# Patient Record
Sex: Male | Born: 1945 | Race: White | Hispanic: No | Marital: Single | Smoking: Never smoker
Health system: Southern US, Community
[De-identification: ages and names within clinical notes are randomized; demographics above are authoritative.]

## PROBLEM LIST (undated history)

## (undated) DIAGNOSIS — H269 Unspecified cataract: Secondary | ICD-10-CM

## (undated) DIAGNOSIS — D696 Thrombocytopenia, unspecified: Secondary | ICD-10-CM

## (undated) DIAGNOSIS — I10 Essential (primary) hypertension: Secondary | ICD-10-CM

## (undated) DIAGNOSIS — J449 Chronic obstructive pulmonary disease, unspecified: Secondary | ICD-10-CM

## (undated) DIAGNOSIS — Z972 Presence of dental prosthetic device (complete) (partial): Secondary | ICD-10-CM

## (undated) DIAGNOSIS — I509 Heart failure, unspecified: Secondary | ICD-10-CM

## (undated) DIAGNOSIS — Z95810 Presence of automatic (implantable) cardiac defibrillator: Secondary | ICD-10-CM

## (undated) DIAGNOSIS — E11319 Type 2 diabetes mellitus with unspecified diabetic retinopathy without macular edema: Secondary | ICD-10-CM

## (undated) DIAGNOSIS — J90 Pleural effusion, not elsewhere classified: Secondary | ICD-10-CM

## (undated) DIAGNOSIS — I519 Heart disease, unspecified: Secondary | ICD-10-CM

## (undated) DIAGNOSIS — T8859XA Other complications of anesthesia, initial encounter: Secondary | ICD-10-CM

## (undated) DIAGNOSIS — E119 Type 2 diabetes mellitus without complications: Secondary | ICD-10-CM

## (undated) DIAGNOSIS — M1990 Unspecified osteoarthritis, unspecified site: Secondary | ICD-10-CM

## (undated) DIAGNOSIS — K30 Functional dyspepsia: Secondary | ICD-10-CM

## (undated) DIAGNOSIS — Z950 Presence of cardiac pacemaker: Secondary | ICD-10-CM

## (undated) DIAGNOSIS — N189 Chronic kidney disease, unspecified: Secondary | ICD-10-CM

## (undated) DIAGNOSIS — H35039 Hypertensive retinopathy, unspecified eye: Secondary | ICD-10-CM

## (undated) HISTORY — DX: Hypertensive retinopathy, unspecified eye: H35.039

## (undated) HISTORY — DX: Chronic kidney disease, unspecified: N18.9

## (undated) HISTORY — DX: Functional dyspepsia: K30

## (undated) HISTORY — DX: Thrombocytopenia, unspecified: D69.6

## (undated) HISTORY — DX: Unspecified cataract: H26.9

## (undated) HISTORY — PX: CARDIAC SURGERY: SHX584

## (undated) HISTORY — DX: Heart disease, unspecified: I51.9

## (undated) HISTORY — DX: Type 2 diabetes mellitus with unspecified diabetic retinopathy without macular edema: E11.319

## (undated) HISTORY — PX: THORACENTESIS: SHX235

## (undated) HISTORY — PX: CHOLECYSTECTOMY: SHX55

## (undated) HISTORY — DX: Unspecified osteoarthritis, unspecified site: M19.90

## (undated) HISTORY — DX: Type 2 diabetes mellitus without complications: E11.9

## (undated) HISTORY — PX: APPENDECTOMY: SHX54

## (undated) HISTORY — DX: Heart failure, unspecified: I50.9

## (undated) HISTORY — DX: Chronic obstructive pulmonary disease, unspecified: J44.9

---

## 2009-12-18 ENCOUNTER — Inpatient Hospital Stay: Payer: Self-pay | Admitting: Internal Medicine

## 2010-02-21 ENCOUNTER — Encounter: Payer: Self-pay | Admitting: Cardiology

## 2010-03-18 ENCOUNTER — Encounter: Payer: Self-pay | Admitting: Cardiology

## 2010-04-18 ENCOUNTER — Encounter: Payer: Self-pay | Admitting: Cardiology

## 2010-12-23 ENCOUNTER — Ambulatory Visit: Payer: Self-pay | Admitting: Gastroenterology

## 2010-12-23 HISTORY — PX: COLONOSCOPY: SHX174

## 2010-12-26 LAB — PATHOLOGY REPORT

## 2014-02-22 DIAGNOSIS — I2510 Atherosclerotic heart disease of native coronary artery without angina pectoris: Secondary | ICD-10-CM | POA: Insufficient documentation

## 2014-02-22 DIAGNOSIS — I152 Hypertension secondary to endocrine disorders: Secondary | ICD-10-CM | POA: Insufficient documentation

## 2014-02-22 DIAGNOSIS — E1129 Type 2 diabetes mellitus with other diabetic kidney complication: Secondary | ICD-10-CM | POA: Insufficient documentation

## 2014-02-22 DIAGNOSIS — Z794 Long term (current) use of insulin: Secondary | ICD-10-CM | POA: Insufficient documentation

## 2014-02-22 DIAGNOSIS — E1159 Type 2 diabetes mellitus with other circulatory complications: Secondary | ICD-10-CM | POA: Insufficient documentation

## 2014-10-23 DIAGNOSIS — E785 Hyperlipidemia, unspecified: Secondary | ICD-10-CM | POA: Insufficient documentation

## 2016-02-14 ENCOUNTER — Encounter: Payer: Self-pay | Admitting: Oncology

## 2016-02-14 ENCOUNTER — Inpatient Hospital Stay: Payer: Medicare Other

## 2016-02-14 ENCOUNTER — Inpatient Hospital Stay: Payer: Medicare Other | Attending: Oncology | Admitting: Oncology

## 2016-02-14 VITALS — BP 174/81 | HR 66 | Temp 97.5°F | Resp 18 | Ht 73.0 in | Wt 178.0 lb

## 2016-02-14 DIAGNOSIS — J449 Chronic obstructive pulmonary disease, unspecified: Secondary | ICD-10-CM | POA: Diagnosis not present

## 2016-02-14 DIAGNOSIS — E119 Type 2 diabetes mellitus without complications: Secondary | ICD-10-CM | POA: Diagnosis not present

## 2016-02-14 DIAGNOSIS — Z79899 Other long term (current) drug therapy: Secondary | ICD-10-CM | POA: Insufficient documentation

## 2016-02-14 DIAGNOSIS — Z7982 Long term (current) use of aspirin: Secondary | ICD-10-CM | POA: Insufficient documentation

## 2016-02-14 DIAGNOSIS — D696 Thrombocytopenia, unspecified: Secondary | ICD-10-CM | POA: Diagnosis not present

## 2016-02-14 DIAGNOSIS — M129 Arthropathy, unspecified: Secondary | ICD-10-CM | POA: Insufficient documentation

## 2016-02-14 DIAGNOSIS — I10 Essential (primary) hypertension: Secondary | ICD-10-CM | POA: Diagnosis not present

## 2016-02-14 DIAGNOSIS — D649 Anemia, unspecified: Secondary | ICD-10-CM | POA: Insufficient documentation

## 2016-02-14 DIAGNOSIS — Z794 Long term (current) use of insulin: Secondary | ICD-10-CM | POA: Diagnosis not present

## 2016-02-14 DIAGNOSIS — Z7984 Long term (current) use of oral hypoglycemic drugs: Secondary | ICD-10-CM | POA: Insufficient documentation

## 2016-02-14 DIAGNOSIS — D61818 Other pancytopenia: Secondary | ICD-10-CM

## 2016-02-14 LAB — IRON AND TIBC
IRON: 54 ug/dL (ref 45–182)
SATURATION RATIOS: 20 % (ref 17.9–39.5)
TIBC: 272 ug/dL (ref 250–450)
UIBC: 218 ug/dL

## 2016-02-14 LAB — CBC WITH DIFFERENTIAL/PLATELET
Basophils Absolute: 0 10*3/uL (ref 0–0.1)
Basophils Relative: 1 %
EOS PCT: 1 %
Eosinophils Absolute: 0.1 10*3/uL (ref 0–0.7)
HCT: 35 % — ABNORMAL LOW (ref 40.0–52.0)
HEMOGLOBIN: 12.7 g/dL — AB (ref 13.0–18.0)
LYMPHS PCT: 30 %
Lymphs Abs: 1.4 10*3/uL (ref 1.0–3.6)
MCH: 32.9 pg (ref 26.0–34.0)
MCHC: 36.1 g/dL — AB (ref 32.0–36.0)
MCV: 91.1 fL (ref 80.0–100.0)
MONOS PCT: 8 %
Monocytes Absolute: 0.4 10*3/uL (ref 0.2–1.0)
NEUTROS PCT: 60 %
Neutro Abs: 2.8 10*3/uL (ref 1.4–6.5)
Platelets: 116 10*3/uL — ABNORMAL LOW (ref 150–440)
RBC: 3.85 MIL/uL — AB (ref 4.40–5.90)
RDW: 13.9 % (ref 11.5–14.5)
WBC: 4.8 10*3/uL (ref 3.8–10.6)

## 2016-02-14 LAB — FOLATE: Folate: 23 ng/mL (ref >5.9)

## 2016-02-14 LAB — LACTATE DEHYDROGENASE: LDH: 180 U/L (ref 98–192)

## 2016-02-14 LAB — RETICULOCYTES
RBC.: 3.85 MIL/uL — ABNORMAL LOW (ref 4.40–5.90)
RETIC COUNT ABSOLUTE: 65.5 10*3/uL (ref 19.0–183.0)
Retic Ct Pct: 1.7 % (ref 0.4–3.1)

## 2016-02-14 LAB — VITAMIN B12: VITAMIN B 12: 349 pg/mL (ref 180–914)

## 2016-02-14 LAB — FERRITIN: Ferritin: 130 ng/mL (ref 24–336)

## 2016-02-14 LAB — DAT, POLYSPECIFIC AHG (ARMC ONLY): Polyspecific AHG test: NEGATIVE

## 2016-02-14 NOTE — Progress Notes (Signed)
Offers no complaints. Here for evaluation regarding thrombocytopenia, referred by PCP Dr. Lisette Grinder.

## 2016-02-15 LAB — PROTEIN ELECTROPHORESIS, SERUM
A/G RATIO SPE: 1.5 (ref 0.7–1.7)
Albumin ELP: 4.1 g/dL (ref 2.9–4.4)
Alpha-1-Globulin: 0.2 g/dL (ref 0.0–0.4)
Alpha-2-Globulin: 0.7 g/dL (ref 0.4–1.0)
Beta Globulin: 1 g/dL (ref 0.7–1.3)
GLOBULIN, TOTAL: 2.8 g/dL (ref 2.2–3.9)
Gamma Globulin: 0.8 g/dL (ref 0.4–1.8)
Total Protein ELP: 6.9 g/dL (ref 6.0–8.5)

## 2016-02-15 LAB — PLATELET ANTIBODY PROFILE, SERUM
HLA Ab Ser Ql EIA: NEGATIVE
IA/IIA ANTIBODY: NEGATIVE
IB/IX ANTIBODY: NEGATIVE
IIB/IIIA Antibody: NEGATIVE

## 2016-02-15 LAB — HAPTOGLOBIN: Haptoglobin: 98 mg/dL (ref 34–200)

## 2016-02-19 NOTE — Progress Notes (Addendum)
[facility]  Telephone:(336) [phone] Fax:(336) [phone]  ID: Rick Zuniga OB: Nov 20, 1945  MR#: [id number]  ELT#:[id number]  Patient Care Team: Madelyn Brunner, MD as PCP - General (Internal Medicine)  CHIEF COMPLAINT: Thrombocytopenia, anemia.  INTERVAL HISTORY: Patient is a 70 year old male who was noted to have a declining platelet count on routine blood work. He is also found to have a mild anemia. Currently, he feels well and is asymptomatic. He denies any weakness or fatigue. He denies any easy bleeding or bruising. He has a good appetite and denies weight loss. He has no neurologic complaints. He denies any recent fevers or illnesses. He has no chest pain or shortness of breath. He denies any nausea, vomiting, constipation, or diarrhea. He has no urinary complaints. Patient feels at his baseline and offers no specific complaints today.  REVIEW OF SYSTEMS:   Review of Systems  Constitutional: Negative.  Negative for fever, weight loss and malaise/fatigue.  Respiratory: Negative.  Negative for cough and shortness of breath.   Cardiovascular: Negative.  Negative for chest pain.  Gastrointestinal: Negative.  Negative for abdominal pain.  Genitourinary: Negative.   Musculoskeletal: Negative.   Neurological: Negative.  Negative for weakness.  Endo/Heme/Allergies: Does not bruise/bleed easily.  Psychiatric/Behavioral: Negative.     As per HPI. Otherwise, a complete review of systems is negatve.  PAST MEDICAL HISTORY: Past Medical History  Diagnosis Date  . Diabetes ([place])   . Heart disease   . COPD (chronic obstructive pulmonary disease) ([place])   . Indigestion   . Arthritis   . Thrombocytopenia ([place])     PAST SURGICAL HISTORY: Past Surgical History  Procedure Laterality Date  . Colonoscopy  12/23/2010  . Cholecystectomy    . Cardiac surgery      bypass    FAMILY HISTORY No family history on file.     ADVANCED DIRECTIVES:    HEALTH  MAINTENANCE: Social History  Substance Use Topics  . Smoking status: Not on file  . Smokeless tobacco: Not on file  . Alcohol Use: Not on file     Colonoscopy:  PAP:  Bone density:  Lipid panel:  Allergies  Allergen Reactions  . Pioglitazone Swelling  . Atorvastatin Rash  . Benazepril Rash  . Latex Rash  . Tape Rash    Current Outpatient Prescriptions  Medication Sig Dispense Refill  . aspirin EC 81 MG tablet Take 1 tablet by mouth daily.    Marland Kitchen glipiZIDE (GLUCOTROL) 10 MG tablet Take 1 tablet by mouth 2 (two) times daily.    . insulin NPH-regular Human (NOVOLIN 70/30) (70-30) 100 UNIT/ML injection Inject 20 Units into the skin 2 (two) times daily.    . metFORMIN (GLUCOPHAGE) 500 MG tablet Take 1 tablet by mouth 2 (two) times daily.    Marland Kitchen nystatin cream (MYCOSTATIN) Apply 1 application topically 2 (two) times daily.    . Probiotic Product (ALIGN) 4 MG CAPS Take 1 capsule by mouth daily as needed (for diarrhea).    . vitamin C (ASCORBIC ACID) 500 MG tablet Take 1 tablet by mouth daily.     No current facility-administered medications for this visit.    OBJECTIVE: Filed Vitals:   02/14/16 1012  BP: 174/81  Pulse: 66  Temp: 97.5 F (36.4 C)  Resp: 18     Body mass index is 23.49 kg/(m^2).    ECOG FS:0 - Asymptomatic  General: Well-developed, well-nourished, no acute distress. Eyes: Pink conjunctiva, anicteric sclera. HEENT: Normocephalic, moist mucous membranes, clear  oropharnyx. Lungs: Clear to auscultation bilaterally. Heart: Regular rate and rhythm. No rubs, murmurs, or gallops. Abdomen: Soft, nontender, nondistended. No organomegaly noted, normoactive bowel sounds. Musculoskeletal: No edema, cyanosis, or clubbing. Neuro: Alert, answering all questions appropriately. Cranial nerves grossly intact. Skin: No rashes or petechiae noted. Psych: Normal affect. Lymphatics: No cervical, calvicular, axillary or inguinal LAD.   LAB RESULTS:  No results found for: NA, K,  CL, CO2, GLUCOSE, BUN, CREATININE, CALCIUM, PROT, ALBUMIN, AST, ALT, ALKPHOS, BILITOT, GFRNONAA, GFRAA  Lab Results  Component Value Date   WBC 4.8 02/14/2016   NEUTROABS 2.8 02/14/2016   HGB 12.7* 02/14/2016   HCT 35.0* 02/14/2016   MCV 91.1 02/14/2016   PLT 116* 02/14/2016   Lab Results  Component Value Date   IRON 54 02/14/2016   TIBC 272 02/14/2016   IRONPCTSAT 20 02/14/2016    Lab Results  Component Value Date   FERRITIN 130 02/14/2016     STUDIES: No results found.  ASSESSMENT: Thrombocytopenia, anemia.  PLAN:    1. Thrombocytopenia: Patient's pelvic count is decreased, but relatively unchanged for approximately 6 months. The remainder of his laboratory work is either negative or within normal limits. No intervention is needed at this time. Patient has been instructed it is safe to take his daily aspirin as prescribed. Return to clinic in 1 month for further evaluation and discussion of his laboratory work. 2. Anemia: Mild. Iron stores, B 12, and folate are all within normal limits. Patient has no evidence of hemolysis. No intervention is needed. Patient does not require bone marrow biopsy. Follow-up as above. 3. Hypertension: Patient does not appear to be on blood pressure medications. I will forward results to patient's primary care physician for further evaluation and treatment if necessary.  Patient expressed understanding and was in agreement with this plan. He also understands that He can call clinic at any time with any questions, concerns, or complaints.    [name], MD   02/19/2016 7:52 AM

## 2016-02-28 DIAGNOSIS — D696 Thrombocytopenia, unspecified: Secondary | ICD-10-CM | POA: Insufficient documentation

## 2016-02-28 DIAGNOSIS — D649 Anemia, unspecified: Secondary | ICD-10-CM | POA: Insufficient documentation

## 2016-03-13 ENCOUNTER — Inpatient Hospital Stay (HOSPITAL_BASED_OUTPATIENT_CLINIC_OR_DEPARTMENT_OTHER): Payer: Medicare Other | Admitting: Oncology

## 2016-03-13 ENCOUNTER — Inpatient Hospital Stay: Payer: Medicare Other | Attending: Oncology

## 2016-03-13 ENCOUNTER — Encounter (INDEPENDENT_AMBULATORY_CARE_PROVIDER_SITE_OTHER): Payer: Self-pay

## 2016-03-13 VITALS — BP 153/74 | HR 68 | Temp 95.3°F | Resp 18 | Wt 180.0 lb

## 2016-03-13 DIAGNOSIS — D696 Thrombocytopenia, unspecified: Secondary | ICD-10-CM | POA: Diagnosis not present

## 2016-03-13 DIAGNOSIS — I509 Heart failure, unspecified: Secondary | ICD-10-CM

## 2016-03-13 DIAGNOSIS — M129 Arthropathy, unspecified: Secondary | ICD-10-CM

## 2016-03-13 DIAGNOSIS — Z7982 Long term (current) use of aspirin: Secondary | ICD-10-CM | POA: Insufficient documentation

## 2016-03-13 DIAGNOSIS — E119 Type 2 diabetes mellitus without complications: Secondary | ICD-10-CM | POA: Diagnosis not present

## 2016-03-13 DIAGNOSIS — D649 Anemia, unspecified: Secondary | ICD-10-CM | POA: Diagnosis not present

## 2016-03-13 DIAGNOSIS — K30 Functional dyspepsia: Secondary | ICD-10-CM

## 2016-03-13 DIAGNOSIS — Z794 Long term (current) use of insulin: Secondary | ICD-10-CM | POA: Diagnosis not present

## 2016-03-13 DIAGNOSIS — Z79899 Other long term (current) drug therapy: Secondary | ICD-10-CM

## 2016-03-13 DIAGNOSIS — J449 Chronic obstructive pulmonary disease, unspecified: Secondary | ICD-10-CM | POA: Insufficient documentation

## 2016-03-13 DIAGNOSIS — Z7984 Long term (current) use of oral hypoglycemic drugs: Secondary | ICD-10-CM | POA: Insufficient documentation

## 2016-03-13 DIAGNOSIS — I10 Essential (primary) hypertension: Secondary | ICD-10-CM | POA: Diagnosis not present

## 2016-03-13 DIAGNOSIS — D61818 Other pancytopenia: Secondary | ICD-10-CM

## 2016-03-13 LAB — CBC
HEMATOCRIT: 32.9 % — AB (ref 40.0–52.0)
HEMOGLOBIN: 11.8 g/dL — AB (ref 13.0–18.0)
MCH: 33.1 pg (ref 26.0–34.0)
MCHC: 36 g/dL (ref 32.0–36.0)
MCV: 92.1 fL (ref 80.0–100.0)
Platelets: 100 10*3/uL — ABNORMAL LOW (ref 150–440)
RBC: 3.57 MIL/uL — ABNORMAL LOW (ref 4.40–5.90)
RDW: 13.8 % (ref 11.5–14.5)
WBC: 4.2 10*3/uL (ref 3.8–10.6)

## 2016-03-13 NOTE — Progress Notes (Signed)
States is feeling well. Offers no complaints. 

## 2016-03-16 NOTE — Progress Notes (Signed)
[facility]  Telephone:([phone] [phone]  ID: Rick Zuniga OB: 1946-01-31  MR#: [id number]  JJH#:[id number]  Patient Care Team: Madelyn Brunner, MD as PCP - General (Internal Medicine)  CHIEF COMPLAINT: Thrombocytopenia, anemia.  INTERVAL HISTORY: Patient returns to clinic today for further evaluation and repeat laboratory work. He continues to feel well and is asymptomatic. He denies any weakness or fatigue. He denies any easy bleeding or bruising. He has a good appetite and denies weight loss. He has no neurologic complaints. He denies any recent fevers or illnesses. He has no chest pain or shortness of breath. He denies any nausea, vomiting, constipation, or diarrhea. He has no urinary complaints. Patient offers no specific complaints today.  REVIEW OF SYSTEMS:   Review of Systems  Constitutional: Negative.  Negative for fever, malaise/fatigue and weight loss.  Respiratory: Negative.  Negative for cough and shortness of breath.   Cardiovascular: Negative.  Negative for chest pain.  Gastrointestinal: Negative.  Negative for abdominal pain.  Genitourinary: Negative.   Musculoskeletal: Negative.   Neurological: Negative.  Negative for weakness.  Endo/Heme/Allergies: Does not bruise/bleed easily.  Psychiatric/Behavioral: Negative.     As per HPI. Otherwise, a complete review of systems is negatve.  PAST MEDICAL HISTORY: Past Medical History:  Diagnosis Date  . Arthritis   . COPD (chronic obstructive pulmonary disease) ([place])   . Diabetes ([place])   . Heart disease   . Indigestion   . Thrombocytopenia ([place])     PAST SURGICAL HISTORY: Past Surgical History:  Procedure Laterality Date  . CARDIAC SURGERY     bypass  . CHOLECYSTECTOMY    . COLONOSCOPY  12/23/2010    FAMILY HISTORY No family history on file.     ADVANCED DIRECTIVES:    HEALTH MAINTENANCE: Social History  Substance Use Topics  . Smoking status: Not on file  .  Smokeless tobacco: Not on file  . Alcohol use Not on file     Colonoscopy:  PAP:  Bone density:  Lipid panel:  Allergies  Allergen Reactions  . Pioglitazone Swelling  . Atorvastatin Rash  . Benazepril Rash  . Latex Rash  . Tape Rash    Current Outpatient Prescriptions  Medication Sig Dispense Refill  . aspirin EC 81 MG tablet Take 1 tablet by mouth daily.    Marland Kitchen glipiZIDE (GLUCOTROL) 10 MG tablet Take 1 tablet by mouth 2 (two) times daily.    . insulin NPH-regular Human (NOVOLIN 70/30) (70-30) 100 UNIT/ML injection Inject 20 Units into the skin 2 (two) times daily.    . metFORMIN (GLUCOPHAGE) 500 MG tablet Take 1 tablet by mouth 2 (two) times daily.    Marland Kitchen nystatin cream (MYCOSTATIN) Apply 1 application topically 2 (two) times daily.    . Probiotic Product (ALIGN) 4 MG CAPS Take 1 capsule by mouth daily as needed (for diarrhea).    . vitamin C (ASCORBIC ACID) 500 MG tablet Take 1 tablet by mouth daily.     No current facility-administered medications for this visit.     OBJECTIVE: Vitals:   03/13/16 1057  BP: (!) 153/74  Pulse: 68  Resp: 18  Temp: (!) 95.3 F (35.2 C)     Body mass index is 23.75 kg/m.    ECOG FS:0 - Asymptomatic  General: Well-developed, well-nourished, no acute distress. Eyes: Pink conjunctiva, anicteric sclera. Lungs: Clear to auscultation bilaterally. Heart: Regular rate and rhythm. No rubs, murmurs, or gallops. Abdomen: Soft, nontender, nondistended. No organomegaly noted, normoactive bowel  sounds. Musculoskeletal: No edema, cyanosis, or clubbing. Neuro: Alert, answering all questions appropriately. Cranial nerves grossly intact. Skin: No rashes or petechiae noted. Psych: Normal affect.   LAB RESULTS:  No results found for: NA, K, CL, CO2, GLUCOSE, BUN, CREATININE, CALCIUM, PROT, ALBUMIN, AST, ALT, ALKPHOS, BILITOT, GFRNONAA, GFRAA  Lab Results  Component Value Date   WBC 4.2 03/13/2016   NEUTROABS 2.8 02/14/2016   HGB 11.8 (L) 03/13/2016    HCT 32.9 (L) 03/13/2016   MCV 92.1 03/13/2016   PLT 100 (L) 03/13/2016   Lab Results  Component Value Date   IRON 54 02/14/2016   TIBC 272 02/14/2016   IRONPCTSAT 20 02/14/2016    Lab Results  Component Value Date   FERRITIN 130 02/14/2016     STUDIES: No results found.  ASSESSMENT: Thrombocytopenia, anemia.  PLAN:    1. Thrombocytopenia: Patient's platelet count is decreased, but relatively unchanged for approximately 6 months. The remainder of his laboratory work is either negative or within normal limits. No intervention is needed at this time. Patient has been instructed it is safe to continue to take his daily aspirin as prescribed. Return to clinic in 6 months for repeat laboratory work and further evaluation. 2. Anemia: Mild. Iron stores, B 12, and folate are all within normal limits. Patient has no evidence of hemolysis. No intervention is needed. Patient does not require bone marrow biopsy. Follow-up as above. 3. Hypertension: Patient does not appear to be on blood pressure medications. Continue monitoring treatment per primary care.   Patient expressed understanding and was in agreement with this plan. He also understands that He can call clinic at any time with any questions, concerns, or complaints.    [name], MD   03/16/2016 8:29 AM

## 2016-09-15 NOTE — Progress Notes (Deleted)
[facility]  Telephone:(336) [phone] Fax:(336) [phone]  ID: Rick Zuniga OB: 04-25-1946  MR#: [id number]  TGG#:[id number]  Patient Care Team: Madelyn Brunner, MD as PCP - General (Internal Medicine)  CHIEF COMPLAINT: Thrombocytopenia, anemia.  INTERVAL HISTORY: Patient returns to clinic today for further evaluation and repeat laboratory work. He continues to feel well and is asymptomatic. He denies any weakness or fatigue. He denies any easy bleeding or bruising. He has a good appetite and denies weight loss. He has no neurologic complaints. He denies any recent fevers or illnesses. He has no chest pain or shortness of breath. He denies any nausea, vomiting, constipation, or diarrhea. He has no urinary complaints. Patient offers no specific complaints today.  REVIEW OF SYSTEMS:   Review of Systems  Constitutional: Negative.  Negative for fever, malaise/fatigue and weight loss.  Respiratory: Negative.  Negative for cough and shortness of breath.   Cardiovascular: Negative.  Negative for chest pain.  Gastrointestinal: Negative.  Negative for abdominal pain.  Genitourinary: Negative.   Musculoskeletal: Negative.   Neurological: Negative.  Negative for weakness.  Endo/Heme/Allergies: Does not bruise/bleed easily.  Psychiatric/Behavioral: Negative.     As per HPI. Otherwise, a complete review of systems is negatve.  PAST MEDICAL HISTORY: Past Medical History:  Diagnosis Date  . Arthritis   . COPD (chronic obstructive pulmonary disease) ([place])   . Diabetes ([place])   . Heart disease   . Indigestion   . Thrombocytopenia ([place])     PAST SURGICAL HISTORY: Past Surgical History:  Procedure Laterality Date  . CARDIAC SURGERY     bypass  . CHOLECYSTECTOMY    . COLONOSCOPY  12/23/2010    FAMILY HISTORY No family history on file.     ADVANCED DIRECTIVES:    HEALTH MAINTENANCE: Social History  Substance Use Topics  . Smoking status: Not on file  .  Smokeless tobacco: Not on file  . Alcohol use Not on file     Colonoscopy:  PAP:  Bone density:  Lipid panel:  Allergies  Allergen Reactions  . Pioglitazone Swelling  . Atorvastatin Rash  . Benazepril Rash  . Latex Rash  . Tape Rash    Current Outpatient Prescriptions  Medication Sig Dispense Refill  . aspirin EC 81 MG tablet Take 1 tablet by mouth daily.    Marland Kitchen glipiZIDE (GLUCOTROL) 10 MG tablet Take 1 tablet by mouth 2 (two) times daily.    . insulin NPH-regular Human (NOVOLIN 70/30) (70-30) 100 UNIT/ML injection Inject 20 Units into the skin 2 (two) times daily.    . metFORMIN (GLUCOPHAGE) 500 MG tablet Take 1 tablet by mouth 2 (two) times daily.    . Probiotic Product (ALIGN) 4 MG CAPS Take 1 capsule by mouth daily as needed (for diarrhea).    . vitamin C (ASCORBIC ACID) 500 MG tablet Take 1 tablet by mouth daily.     No current facility-administered medications for this visit.     OBJECTIVE: There were no vitals filed for this visit.   There is no height or weight on file to calculate BMI.    ECOG FS:0 - Asymptomatic  General: Well-developed, well-nourished, no acute distress. Eyes: Pink conjunctiva, anicteric sclera. Lungs: Clear to auscultation bilaterally. Heart: Regular rate and rhythm. No rubs, murmurs, or gallops. Abdomen: Soft, nontender, nondistended. No organomegaly noted, normoactive bowel sounds. Musculoskeletal: No edema, cyanosis, or clubbing. Neuro: Alert, answering all questions appropriately. Cranial nerves grossly intact. Skin: No rashes or petechiae noted. Psych: Normal affect.  LAB RESULTS:  No results found for: NA, K, CL, CO2, GLUCOSE, BUN, CREATININE, CALCIUM, PROT, ALBUMIN, AST, ALT, ALKPHOS, BILITOT, GFRNONAA, GFRAA  Lab Results  Component Value Date   WBC 4.2 03/13/2016   NEUTROABS 2.8 02/14/2016   HGB 11.8 (L) 03/13/2016   HCT 32.9 (L) 03/13/2016   MCV 92.1 03/13/2016   PLT 100 (L) 03/13/2016   Lab Results  Component Value Date     IRON 54 02/14/2016   TIBC 272 02/14/2016   IRONPCTSAT 20 02/14/2016    Lab Results  Component Value Date   FERRITIN 130 02/14/2016     STUDIES: No results found.  ASSESSMENT: Thrombocytopenia, anemia.  PLAN:    1. Thrombocytopenia: Patient's platelet count is decreased, but relatively unchanged for approximately 6 months. The remainder of his laboratory work is either negative or within normal limits. No intervention is needed at this time. Patient has been instructed it is safe to continue to take his daily aspirin as prescribed. Return to clinic in 6 months for repeat laboratory work and further evaluation. 2. Anemia: Mild. Iron stores, B 12, and folate are all within normal limits. Patient has no evidence of hemolysis. No intervention is needed. Patient does not require bone marrow biopsy. Follow-up as above. 3. Hypertension: Patient does not appear to be on blood pressure medications. Continue monitoring treatment per primary care.   Patient expressed understanding and was in agreement with this plan. He also understands that He can call clinic at any time with any questions, concerns, or complaints.    [name], MD   09/15/2016 9:52 PM

## 2016-09-17 ENCOUNTER — Inpatient Hospital Stay: Payer: Medicare Other

## 2016-09-17 ENCOUNTER — Inpatient Hospital Stay: Payer: Medicare Other | Admitting: Oncology

## 2016-10-07 ENCOUNTER — Inpatient Hospital Stay: Payer: Medicare Other | Admitting: Oncology

## 2016-10-07 ENCOUNTER — Inpatient Hospital Stay: Payer: Medicare Other

## 2016-10-16 ENCOUNTER — Other Ambulatory Visit: Payer: Medicare Other

## 2016-10-16 ENCOUNTER — Ambulatory Visit: Payer: Medicare Other | Admitting: Oncology

## 2016-11-27 ENCOUNTER — Other Ambulatory Visit: Payer: Medicare Other

## 2016-11-27 ENCOUNTER — Ambulatory Visit: Payer: Medicare Other | Admitting: Oncology

## 2016-12-10 NOTE — Progress Notes (Signed)
[facility]  [phone] [phone]  ID: Rick Zuniga OB: 08/06/1946  MR#: [id number]  CSN#:[id number]  Patient Care Team: John B Walker III, MD as PCP - General (Internal Medicine)  CHIEF COMPLAINT: Thrombocytopenia, anemia.  INTERVAL HISTORY: Patient returns to clinic today for further evaluation and repeat laboratory work. He continues to feel well and is asymptomatic. He denies any weakness or fatigue. He denies any easy bleeding or bruising. He has a good appetite and denies weight loss. He has no neurologic complaints. He denies any recent fevers or illnesses. He has no chest pain or shortness of breath. He denies any nausea, vomiting, constipation, or diarrhea. He has no urinary complaints. Patient offers no specific complaints today.  REVIEW OF SYSTEMS:   Review of Systems  Constitutional: Negative.  Negative for fever, malaise/fatigue and weight loss.  Respiratory: Negative.  Negative for cough and shortness of breath.   Cardiovascular: Negative.  Negative for chest pain and leg swelling.  Gastrointestinal: Negative.  Negative for abdominal pain.  Genitourinary: Negative.   Musculoskeletal: Negative.   Skin: Negative.  Negative for rash.  Neurological: Negative.  Negative for sensory change and weakness.  Endo/Heme/Allergies: Does not bruise/bleed easily.  Psychiatric/Behavioral: Negative.  The patient is not nervous/anxious.     As per HPI. Otherwise, a complete review of systems is negative.  PAST MEDICAL HISTORY: Past Medical History:  Diagnosis Date  . Arthritis   . COPD (chronic obstructive pulmonary disease) ([facility])   . Diabetes ([facility])   . Heart disease   . Indigestion   . Thrombocytopenia ([facility])     PAST SURGICAL HISTORY: Past Surgical History:  Procedure Laterality Date  . CARDIAC SURGERY     bypass  . CHOLECYSTECTOMY    . COLONOSCOPY  12/23/2010    FAMILY HISTORY No family history on file.     ADVANCED  DIRECTIVES:    HEALTH MAINTENANCE: Social History  Substance Use Topics  . Smoking status: Not on file  . Smokeless tobacco: Not on file  . Alcohol use Not on file     Colonoscopy:  PAP:  Bone density:  Lipid panel:  Allergies  Allergen Reactions  . Pioglitazone Swelling  . Atorvastatin Rash  . Benazepril Rash  . Latex Rash  . Tape Rash    Current Outpatient Prescriptions  Medication Sig Dispense Refill  . aspirin EC 81 MG tablet Take 1 tablet by mouth daily.    . glipiZIDE (GLUCOTROL) 10 MG tablet Take 1 tablet by mouth 2 (two) times daily.    . insulin NPH-regular Human (NOVOLIN 70/30) (70-30) 100 UNIT/ML injection Inject 20 Units into the skin 2 (two) times daily.    . metFORMIN (GLUCOPHAGE) 500 MG tablet Take 1 tablet by mouth 2 (two) times daily.    . Probiotic Product (ALIGN) 4 MG CAPS Take 1 capsule by mouth daily as needed (for diarrhea).    . vitamin C (ASCORBIC ACID) 500 MG tablet Take 1 tablet by mouth daily.     No current facility-administered medications for this visit.     OBJECTIVE: Vitals:   12/11/16 0958  BP: (!) 186/83  Pulse: 73  Resp: 18  Temp: 97.1 F (36.2 C)     Body mass index is 23.93 kg/m.    ECOG FS:0 - Asymptomatic  General: Well-developed, well-nourished, no acute distress. Eyes: Pink conjunctiva, anicteric sclera. Lungs: Clear to auscultation bilaterally. Heart: Regular rate and rhythm. No rubs, murmurs, or gallops. Abdomen: Soft, nontender, nondistended. No organomegaly   noted, normoactive bowel sounds. Musculoskeletal: No edema, cyanosis, or clubbing. Neuro: Alert, answering all questions appropriately. Cranial nerves grossly intact. Skin: No rashes or petechiae noted. Psych: Normal affect.   LAB RESULTS:  No results found for: NA, K, CL, CO2, GLUCOSE, BUN, CREATININE, CALCIUM, PROT, ALBUMIN, AST, ALT, ALKPHOS, BILITOT, GFRNONAA, GFRAA  Lab Results  Component Value Date   WBC 4.5 12/11/2016   NEUTROABS 3.1 12/11/2016    HGB 12.1 (L) 12/11/2016   HCT 33.6 (L) 12/11/2016   MCV 91.8 12/11/2016   PLT 121 (L) 12/11/2016   Lab Results  Component Value Date   IRON 54 02/14/2016   TIBC 272 02/14/2016   IRONPCTSAT 20 02/14/2016    Lab Results  Component Value Date   FERRITIN 130 02/14/2016     STUDIES: No results found.  ASSESSMENT: Thrombocytopenia, anemia.  PLAN:    1. Thrombocytopenia: Patient's platelet count is decreased, but relatively unchanged over the past year. The remainder of his laboratory work is either negative or within normal limits. No intervention is needed at this time. Patient has been instructed it is safe to continue to take his daily aspirin as prescribed. He does not require bone marrow biopsy. After discussion with the patient, it was determined that no further follow-up is necessary. Please continue to monitor patient's platelet count and if it falls and persistently stays below 100 please refer him back for further evaluation.  2. Anemia: Mild. Iron stores, B 12, and folate are all within normal limits. Patient has no evidence of hemolysis. No intervention is needed. Patient does not require bone marrow biopsy. 3. Hypertension: Patient does not appear to be on blood pressure medications. Continue monitoring treatment per primary care.   Patient expressed understanding and was in agreement with this plan. He also understands that He can call clinic at any time with any questions, concerns, or complaints.    Timothy J Finnegan, MD   12/13/2016 5:23 PM      

## 2016-12-11 ENCOUNTER — Inpatient Hospital Stay: Payer: Medicare Other | Attending: Oncology

## 2016-12-11 ENCOUNTER — Inpatient Hospital Stay (HOSPITAL_BASED_OUTPATIENT_CLINIC_OR_DEPARTMENT_OTHER): Payer: Medicare Other | Admitting: Oncology

## 2016-12-11 VITALS — BP 186/83 | HR 73 | Temp 97.1°F | Resp 18 | Wt 181.4 lb

## 2016-12-11 DIAGNOSIS — Z794 Long term (current) use of insulin: Secondary | ICD-10-CM | POA: Insufficient documentation

## 2016-12-11 DIAGNOSIS — D696 Thrombocytopenia, unspecified: Secondary | ICD-10-CM

## 2016-12-11 DIAGNOSIS — Z7982 Long term (current) use of aspirin: Secondary | ICD-10-CM

## 2016-12-11 DIAGNOSIS — E119 Type 2 diabetes mellitus without complications: Secondary | ICD-10-CM

## 2016-12-11 DIAGNOSIS — J449 Chronic obstructive pulmonary disease, unspecified: Secondary | ICD-10-CM | POA: Diagnosis not present

## 2016-12-11 DIAGNOSIS — I10 Essential (primary) hypertension: Secondary | ICD-10-CM

## 2016-12-11 DIAGNOSIS — I509 Heart failure, unspecified: Secondary | ICD-10-CM

## 2016-12-11 DIAGNOSIS — Z79899 Other long term (current) drug therapy: Secondary | ICD-10-CM

## 2016-12-11 DIAGNOSIS — D649 Anemia, unspecified: Secondary | ICD-10-CM | POA: Insufficient documentation

## 2016-12-11 LAB — CBC WITH DIFFERENTIAL/PLATELET
BASOS PCT: 0 %
Basophils Absolute: 0 10*3/uL (ref 0–0.1)
Eosinophils Absolute: 0.1 10*3/uL (ref 0–0.7)
Eosinophils Relative: 1 %
HEMATOCRIT: 33.6 % — AB (ref 40.0–52.0)
HEMOGLOBIN: 12.1 g/dL — AB (ref 13.0–18.0)
LYMPHS ABS: 1 10*3/uL (ref 1.0–3.6)
Lymphocytes Relative: 23 %
MCH: 33.1 pg (ref 26.0–34.0)
MCHC: 36 g/dL (ref 32.0–36.0)
MCV: 91.8 fL (ref 80.0–100.0)
MONO ABS: 0.4 10*3/uL (ref 0.2–1.0)
MONOS PCT: 8 %
NEUTROS ABS: 3.1 10*3/uL (ref 1.4–6.5)
NEUTROS PCT: 68 %
Platelets: 121 10*3/uL — ABNORMAL LOW (ref 150–440)
RBC: 3.66 MIL/uL — ABNORMAL LOW (ref 4.40–5.90)
RDW: 13.5 % (ref 11.5–14.5)
WBC: 4.5 10*3/uL (ref 3.8–10.6)

## 2016-12-11 NOTE — Progress Notes (Signed)
Offers no complaints. BP elevated due to rushing this morning to get to appt on time.

## 2017-10-27 ENCOUNTER — Inpatient Hospital Stay
Admission: EM | Admit: 2017-10-27 | Discharge: 2017-10-28 | DRG: 305 | Disposition: A | Discharge status: Home / Self Care | Payer: Medicare Other | Attending: Internal Medicine | Admitting: Internal Medicine

## 2017-10-27 ENCOUNTER — Encounter: Payer: Self-pay | Admitting: Emergency Medicine

## 2017-10-27 ENCOUNTER — Other Ambulatory Visit: Payer: Self-pay

## 2017-10-27 ENCOUNTER — Emergency Department: Payer: Medicare Other

## 2017-10-27 ENCOUNTER — Inpatient Hospital Stay
Admit: 2017-10-27 | Discharge: 2017-10-27 | Disposition: A | Discharge status: Home / Self Care | Payer: Medicare Other | Attending: Internal Medicine | Admitting: Internal Medicine

## 2017-10-27 ENCOUNTER — Inpatient Hospital Stay: Payer: Medicare Other

## 2017-10-27 DIAGNOSIS — H532 Diplopia: Secondary | ICD-10-CM | POA: Diagnosis present

## 2017-10-27 DIAGNOSIS — Z794 Long term (current) use of insulin: Secondary | ICD-10-CM

## 2017-10-27 DIAGNOSIS — Z888 Allergy status to other drugs, medicaments and biological substances status: Secondary | ICD-10-CM

## 2017-10-27 DIAGNOSIS — Z9049 Acquired absence of other specified parts of digestive tract: Secondary | ICD-10-CM | POA: Diagnosis not present

## 2017-10-27 DIAGNOSIS — Z79899 Other long term (current) drug therapy: Secondary | ICD-10-CM | POA: Diagnosis not present

## 2017-10-27 DIAGNOSIS — I160 Hypertensive urgency: Principal | ICD-10-CM | POA: Diagnosis present

## 2017-10-27 DIAGNOSIS — Z91040 Latex allergy status: Secondary | ICD-10-CM | POA: Diagnosis not present

## 2017-10-27 DIAGNOSIS — Z841 Family history of disorders of kidney and ureter: Secondary | ICD-10-CM

## 2017-10-27 DIAGNOSIS — J449 Chronic obstructive pulmonary disease, unspecified: Secondary | ICD-10-CM | POA: Diagnosis present

## 2017-10-27 DIAGNOSIS — Z833 Family history of diabetes mellitus: Secondary | ICD-10-CM | POA: Diagnosis not present

## 2017-10-27 DIAGNOSIS — I10 Essential (primary) hypertension: Secondary | ICD-10-CM | POA: Diagnosis present

## 2017-10-27 DIAGNOSIS — Z8249 Family history of ischemic heart disease and other diseases of the circulatory system: Secondary | ICD-10-CM

## 2017-10-27 DIAGNOSIS — I639 Cerebral infarction, unspecified: Secondary | ICD-10-CM

## 2017-10-27 DIAGNOSIS — T380X5A Adverse effect of glucocorticoids and synthetic analogues, initial encounter: Secondary | ICD-10-CM | POA: Diagnosis not present

## 2017-10-27 DIAGNOSIS — D696 Thrombocytopenia, unspecified: Secondary | ICD-10-CM | POA: Diagnosis present

## 2017-10-27 DIAGNOSIS — Z7982 Long term (current) use of aspirin: Secondary | ICD-10-CM | POA: Diagnosis not present

## 2017-10-27 DIAGNOSIS — E1165 Type 2 diabetes mellitus with hyperglycemia: Secondary | ICD-10-CM | POA: Diagnosis not present

## 2017-10-27 DIAGNOSIS — Z881 Allergy status to other antibiotic agents status: Secondary | ICD-10-CM

## 2017-10-27 LAB — GLUCOSE, CAPILLARY
GLUCOSE-CAPILLARY: 205 mg/dL — AB (ref 65–99)
GLUCOSE-CAPILLARY: 254 mg/dL — AB (ref 65–99)
Glucose-Capillary: 230 mg/dL — ABNORMAL HIGH (ref 65–99)

## 2017-10-27 LAB — COMPREHENSIVE METABOLIC PANEL
ALBUMIN: 4.1 g/dL (ref 3.5–5.0)
ALT: 14 U/L — ABNORMAL LOW (ref 17–63)
ANION GAP: 10 (ref 5–15)
AST: 20 U/L (ref 15–41)
Alkaline Phosphatase: 62 U/L (ref 38–126)
BUN: 17 mg/dL (ref 6–20)
CO2: 28 mmol/L (ref 22–32)
Calcium: 9.2 mg/dL (ref 8.9–10.3)
Chloride: 101 mmol/L (ref 101–111)
Creatinine, Ser: 1.31 mg/dL — ABNORMAL HIGH (ref 0.61–1.24)
GFR calc Af Amer: >60 mL/min (ref >60)
GFR calc non Af Amer: 53 mL/min — ABNORMAL LOW (ref >60)
GLUCOSE: 225 mg/dL — AB (ref 65–99)
POTASSIUM: 4 mmol/L (ref 3.5–5.1)
SODIUM: 139 mmol/L (ref 135–145)
Total Bilirubin: 3.1 mg/dL — ABNORMAL HIGH (ref 0.3–1.2)
Total Protein: 7.1 g/dL (ref 6.5–8.1)

## 2017-10-27 LAB — HEMOGLOBIN A1C
Hgb A1c MFr Bld: 9 % — ABNORMAL HIGH (ref 4.8–5.6)
Mean Plasma Glucose: 211.6 mg/dL

## 2017-10-27 LAB — CBC
HCT: 38.7 % — ABNORMAL LOW (ref 40.0–52.0)
Hemoglobin: 13.6 g/dL (ref 13.0–18.0)
MCH: 32.3 pg (ref 26.0–34.0)
MCHC: 35 g/dL (ref 32.0–36.0)
MCV: 92.1 fL (ref 80.0–100.0)
Platelets: 131 10*3/uL — ABNORMAL LOW (ref 150–440)
RBC: 4.21 MIL/uL — ABNORMAL LOW (ref 4.40–5.90)
RDW: 13.8 % (ref 11.5–14.5)
WBC: 5.2 10*3/uL (ref 3.8–10.6)

## 2017-10-27 LAB — SEDIMENTATION RATE: SED RATE: 35 mm/h — AB (ref 0–20)

## 2017-10-27 LAB — C-REACTIVE PROTEIN

## 2017-10-27 MED ORDER — HYDRALAZINE HCL 20 MG/ML IJ SOLN
5.0000 mg | INTRAMUSCULAR | Status: DC | PRN
  Administered 2017-10-27: 18:00:00 5 mg via INTRAVENOUS
  Filled 2017-10-27: qty 1

## 2017-10-27 MED ORDER — ACETAMINOPHEN 325 MG PO TABS: 650.0000 mg | ORAL_TABLET | Freq: Four times a day (QID) | ORAL | Status: DC | PRN

## 2017-10-27 MED ORDER — RISAQUAD PO CAPS: 1.0000 | ORAL_CAPSULE | Freq: Every day | ORAL | Status: DC | PRN

## 2017-10-27 MED ORDER — CHLORTHALIDONE 25 MG PO TABS
25.0000 mg | ORAL_TABLET | Freq: Every day | ORAL | Status: DC
  Administered 2017-10-27 – 2017-10-28 (×2): 25 mg via ORAL
  Filled 2017-10-27 (×2): qty 1

## 2017-10-27 MED ORDER — PREDNISONE 50 MG PO TABS
60.0000 mg | ORAL_TABLET | Freq: Every day | ORAL | Status: DC
  Administered 2017-10-27 – 2017-10-28 (×2): 60 mg via ORAL
  Filled 2017-10-27 (×2): qty 1

## 2017-10-27 MED ORDER — ACETAMINOPHEN 650 MG RE SUPP: 650.0000 mg | Freq: Four times a day (QID) | RECTAL | Status: DC | PRN

## 2017-10-27 MED ORDER — VITAMIN C 500 MG PO TABS
500.0000 mg | ORAL_TABLET | Freq: Every day | ORAL | Status: DC
  Administered 2017-10-28: 08:00:00 500 mg via ORAL
  Filled 2017-10-27: qty 1

## 2017-10-27 MED ORDER — METFORMIN HCL 500 MG PO TABS
500.0000 mg | ORAL_TABLET | Freq: Two times a day (BID) | ORAL | Status: DC
  Administered 2017-10-27 – 2017-10-28 (×2): 500 mg via ORAL
  Filled 2017-10-27 (×3): qty 1

## 2017-10-27 MED ORDER — ONDANSETRON HCL 4 MG PO TABS: 4.0000 mg | ORAL_TABLET | Freq: Four times a day (QID) | ORAL | Status: DC | PRN

## 2017-10-27 MED ORDER — LABETALOL HCL 5 MG/ML IV SOLN
5.0000 mg | Freq: Once | INTRAVENOUS | Status: AC
  Administered 2017-10-27: 5 mg via INTRAVENOUS
  Filled 2017-10-27: qty 4

## 2017-10-27 MED ORDER — ENOXAPARIN SODIUM 40 MG/0.4ML ~~LOC~~ SOLN
40.0000 mg | SUBCUTANEOUS | Status: DC
  Administered 2017-10-27: 20:00:00 40 mg via SUBCUTANEOUS
  Filled 2017-10-27: qty 0.4

## 2017-10-27 MED ORDER — ONDANSETRON HCL 4 MG/2ML IJ SOLN: 4.0000 mg | Freq: Four times a day (QID) | INTRAMUSCULAR | Status: DC | PRN

## 2017-10-27 MED ORDER — AMLODIPINE BESYLATE 5 MG PO TABS
5.0000 mg | ORAL_TABLET | Freq: Every day | ORAL | Status: DC
  Administered 2017-10-27 – 2017-10-28 (×2): 5 mg via ORAL
  Filled 2017-10-27 (×2): qty 1

## 2017-10-27 MED ORDER — HYDRALAZINE HCL 20 MG/ML IJ SOLN: 5.0000 mg | INTRAMUSCULAR | Status: DC | PRN

## 2017-10-27 MED ORDER — STROKE: EARLY STAGES OF RECOVERY BOOK
Freq: Once | Status: AC
  Administered 2017-10-27: 18:00:00

## 2017-10-27 MED ORDER — INSULIN ASPART PROT & ASPART (70-30 MIX) 100 UNIT/ML ~~LOC~~ SUSP
20.0000 [IU] | Freq: Two times a day (BID) | SUBCUTANEOUS | Status: DC
  Administered 2017-10-27 – 2017-10-28 (×2): 20 [IU] via SUBCUTANEOUS
  Filled 2017-10-27 (×2): qty 1

## 2017-10-27 MED ORDER — INSULIN ASPART 100 UNIT/ML ~~LOC~~ SOLN
0.0000 [IU] | Freq: Every day | SUBCUTANEOUS | Status: DC
  Administered 2017-10-27: 3 [IU] via SUBCUTANEOUS
  Filled 2017-10-27: qty 1

## 2017-10-27 MED ORDER — INSULIN ASPART 100 UNIT/ML ~~LOC~~ SOLN
0.0000 [IU] | Freq: Three times a day (TID) | SUBCUTANEOUS | Status: DC
  Administered 2017-10-27: 3 [IU] via SUBCUTANEOUS
  Administered 2017-10-28: 7 [IU] via SUBCUTANEOUS
  Administered 2017-10-28: 08:00:00 5 [IU] via SUBCUTANEOUS
  Filled 2017-10-27 (×4): qty 1

## 2017-10-27 MED ORDER — ASPIRIN EC 325 MG PO TBEC
325.0000 mg | DELAYED_RELEASE_TABLET | Freq: Every day | ORAL | Status: DC
  Administered 2017-10-27 – 2017-10-28 (×2): 325 mg via ORAL
  Filled 2017-10-27 (×2): qty 1

## 2017-10-27 NOTE — ED Notes (Signed)
Pt going to MRI at this time.  Nurse Gerald Stabs aware that pt will be delayed in coming to floor.

## 2017-10-27 NOTE — ED Triage Notes (Signed)
Patient to ER from Dr. Waynetta Sandy office (optometrist) to r/o aneurysm/temporal arteritis. Patient was seen in office and noted to have blurred vision and severe headache.

## 2017-10-27 NOTE — ED Notes (Signed)
Pt reports that when he looks down he had double vision and that sometimes it causes him to lose his balance.  Pt reports that he fell a few days ago.  Pt is A&Ox4, in NAD.  Pt reports having a headache as well.  Pt reports that this started approx 10 days ago.

## 2017-10-27 NOTE — ED Notes (Signed)
ED tech to get patient from MRI and take to the floor.  Gerald Stabs, RN on 1C notified.

## 2017-10-27 NOTE — ED Provider Notes (Signed)
[facility] Emergency Department Provider Note   ____________________________________________    I have reviewed the triage vital signs and the nursing notes.   HISTORY  Chief Complaint Blurred Vision     HPI Rick Zuniga is a 72 y.o. male sent in by his optometrist for evaluation of diplopia and some blurred vision as well as a headache.  Patient reports over the last week he has had right-sided temporal headache which is been somewhat severe, he describes it as a 7 out of 10.  He reports it is worse if he pushes on the area.  He states that since that time he has had some double vision particularly when he looks down.  He is not take anything for this.  This is never happened to him before.  Denies unilateral vision loss.  No neck pain.  No other neuro deficits   Past Medical History:  Diagnosis Date  . Arthritis   . COPD (chronic obstructive pulmonary disease) ([place])   . Diabetes ([place])   . Heart disease   . Indigestion   . Thrombocytopenia [facility])     Patient Active Problem List   Diagnosis Date Noted  . Accelerated hypertension 10/27/2017  . Thrombocytopenia ([place]) 02/28/2016  . Anemia 02/28/2016    Past Surgical History:  Procedure Laterality Date  . CARDIAC SURGERY     bypass  . CHOLECYSTECTOMY    . COLONOSCOPY  12/23/2010    Prior to Admission medications   Medication Sig Start Date End Date Taking? Authorizing Provider  aspirin EC 81 MG tablet Take 1 tablet by mouth daily.    [provider]  glipiZIDE (GLUCOTROL) 10 MG tablet Take 1 tablet by mouth 2 (two) times daily. 01/04/16   [provider]  insulin NPH-regular Human (NOVOLIN 70/30) (70-30) 100 UNIT/ML injection Inject 20 Units into the skin 2 (two) times daily. 06/27/15 12/11/16  [provider]  metFORMIN (GLUCOPHAGE) 500 MG tablet Take 1 tablet by mouth 2 (two) times daily. 06/27/15 12/11/16  [provider]  Probiotic Product (ALIGN) 4  MG CAPS Take 1 capsule by mouth daily as needed (for diarrhea).    [provider]  vitamin C (ASCORBIC ACID) 500 MG tablet Take 1 tablet by mouth daily.    [provider]     Allergies Pioglitazone; Atorvastatin; Benazepril; Latex; and Tape  No family history on file.  Social History Social History   Tobacco Use  . Smoking status: Never Smoker  . Smokeless tobacco: Never Used  Substance Use Topics  . Alcohol use: No    Frequency: Never  . Drug use: No    Review of Systems  Constitutional: No fever/chills Eyes: Blurry vision ENT: No neck pain Cardiovascular: Denies chest pain. Respiratory: Denies shortness of breath. Gastrointestinal: No nausea, no vomiting.   Genitourinary: Negative for dysuria. Musculoskeletal: Negative for back pain. Skin: Negative for rash. Neurological: As above   ____________________________________________   PHYSICAL EXAM:  VITAL SIGNS: ED Triage Vitals  Enc Vitals Group     BP 10/27/17 1232 (!) 217/104     Pulse Rate 10/27/17 1232 73     Resp 10/27/17 1232 16     Temp 10/27/17 1232 (!) 97.5 F (36.4 C)     Temp Source 10/27/17 1232 Oral     SpO2 10/27/17 1232 99 %     Weight 10/27/17 1230 82.6 kg (182 lb)     Height 10/27/17 1230 1.854 m ('6\' 1"'$ )  Head Circumference --      Peak Flow --      Pain Score 10/27/17 1230 7     Pain Loc --      Pain Edu? --      Excl. in [place]? --     Constitutional: Alert and oriented. No acute distress. Pleasant and interactive Eyes: Conjunctivae are normal.  PERRLA, visual acuity noted from optometrist notes, equal in both eyes, patient does appear to have vertical diplopia, when looking down Head: Atraumatic.  Mild tenderness over the right temporal region Nose: No congestion/rhinnorhea. Mouth/Throat: Mucous membranes are moist.   Neck:  Painless ROM Cardiovascular: Normal rate, regular rhythm. Grossly normal heart sounds.  Good peripheral circulation. Respiratory: Normal  respiratory effort.  No retractions. Lungs CTAB. Gastrointestinal: Soft and nontender. No distention.   Genitourinary: deferred Musculoskeletal:  Warm and well perfused Neurologic:  Normal speech and language. No gross focal neurologic deficits are appreciated. CN 2-12 normal Skin:  Skin is warm, dry and intact. No rash noted. Psychiatric: Mood and affect are normal. Speech and behavior are normal.  ____________________________________________   LABS (all labs ordered are listed, but only abnormal results are displayed)  Labs Reviewed  SEDIMENTATION RATE - Abnormal; Notable for the following components:      Result Value   Sed Rate 35 (*)    All other components within normal limits  CBC - Abnormal; Notable for the following components:   RBC 4.21 (*)    HCT 38.7 (*)    Platelets 131 (*)    All other components within normal limits  COMPREHENSIVE METABOLIC PANEL - Abnormal; Notable for the following components:   Glucose, Bld 225 (*)    Creatinine, Ser 1.31 (*)    ALT 14 (*)    Total Bilirubin 3.1 (*)    GFR calc non Af Amer 53 (*)    All other components within normal limits  C-REACTIVE PROTEIN   ____________________________________________  EKG  None ____________________________________________  RADIOLOGY  CT head unremarkable ____________________________________________   PROCEDURES  Procedure(s) performed: No  Procedures   Critical Care performed: No ____________________________________________   INITIAL IMPRESSION / ASSESSMENT AND PLAN / ED COURSE  Pertinent labs & imaging results that were available during my care of the patient were reviewed by me and considered in my medical decision making (see chart for details).  Patient presents with symptoms somewhat concerning for temporal arteritis however ESR is not significantly elevated.  More concerning is his development of diplopia with a headache, this may represent a CVA/aneurysm.  CT head overall  unremarkable however discussed with neurology Dr. Irish Elders who recommends MRI MRA admission for further workup    ____________________________________________   FINAL CLINICAL IMPRESSION(S) / ED DIAGNOSES  Final diagnoses:  Cerebrovascular accident (CVA), unspecified mechanism ([place])  Diplopia        Note:  This document was prepared using Dragon voice recognition software and may include unintentional dictation errors.    Lavonia Drafts, MD 10/27/17 1451

## 2017-10-27 NOTE — Progress Notes (Signed)
Patient ID: Rick Zuniga, male   DOB: [birth year], 72 y.o.   MRN: [id number]  MRI of the brain and MRA of the brain is negative. Start prednisone 60 mg daily for possible temporal arteritis even though sedimentation rate only 35.  We will get vascular surgery evaluation for possible temporal artery biopsy. Now get more aggressive with blood pressure.  Dr. Loletha Grayer

## 2017-10-27 NOTE — H&P (Signed)
[place] [facility] [place] NAME: Rick Zuniga    MR#:  [id number]  DATE OF BIRTH:  [birth year]  DATE OF ADMISSION:  10/27/2017  PRIMARY CARE PHYSICIAN: Dr. Harrel Lemon  REQUESTING/REFERRING PHYSICIAN: Dr. Lavonia Drafts  CHIEF COMPLAINT:   Chief Complaint  Patient presents with  . Blurred Vision    HISTORY OF PRESENT ILLNESS:  Rick Zuniga  is a 72 y.o. male with a known history of diabetes.  He has been having double vision for the last week.  He states that he sees double vision one on top of the other when using both eyes.  He can cover either eye and see okay.  He normally uses reading glasses.  He complains of some pain right outside his right eye going on for the past week.  He saw the eye doctor today and was sent into the ER.  In the ER, his sedimentation rate was 35.  Case discussed with Dr. 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  Admission for stroke.   PAST MEDICAL HISTORY:   Past Medical History:  Diagnosis Date  . Arthritis   . COPD (chronic obstructive pulmonary disease) ([place])   . Diabetes ([place])   . Heart disease   . Indigestion   . Thrombocytopenia ([place])     PAST SURGICAL HISTORY:   Past Surgical History:  Procedure Laterality Date  . APPENDECTOMY    . CARDIAC SURGERY     bypass  . CHOLECYSTECTOMY    . COLONOSCOPY  12/23/2010    SOCIAL HISTORY:   Social History   Tobacco Use  . Smoking status: Never Smoker  . Smokeless tobacco: Never Used  Substance Use Topics  . Alcohol use: No    Frequency: Never    FAMILY HISTORY:   Family History  Problem Relation Age of Onset  . CAD Mother   . Kidney failure Father   . Diabetes Father     DRUG ALLERGIES:   Allergies  Allergen Reactions  . Pioglitazone Swelling  . Atorvastatin Rash  . Benazepril Rash  . Latex Rash  . Tape Rash    REVIEW OF SYSTEMS:  CONSTITUTIONAL: No fever, fatigue or  weakness.  Positive for night sweats EYES: Positive for double vision one on top of the other.  Wears reading glasses EARS, NOSE, AND THROAT: No tinnitus or ear pain. No sore throat RESPIRATORY: No cough.  Positive for shortness of breath.  No wheezing or hemoptysis.  CARDIOVASCULAR: No chest pain, orthopnea, edema.  GASTROINTESTINAL: No nausea, vomiting. occasional diarrhea and abdominal pain. No blood in bowel movements GENITOURINARY: No dysuria, hematuria.  ENDOCRINE: No polyuria, nocturia,  HEMATOLOGY: No anemia, easy bruising or bleeding SKIN: No rash or lesion. MUSCULOSKELETAL: No joint pain or arthritis.   NEUROLOGIC: No tingling, numbness, weakness.  PSYCHIATRY: No anxiety or depression.   MEDICATIONS AT HOME:   Prior to Admission medications   Medication Sig Start Date End Date Taking? Authorizing Provider  aspirin EC 81 MG tablet Take 1 tablet by mouth daily.    [provider]  glipiZIDE (GLUCOTROL) 10 MG tablet Take 1 tablet by mouth 2 (two) times daily. 01/04/16   [provider]  insulin NPH-regular Human (NOVOLIN 70/30) (70-30) 100 UNIT/ML injection Inject 20 Units into the skin 2 (two) times daily.    [provider]  metFORMIN (GLUCOPHAGE) 500 MG tablet Take 500 mg by mouth 2 (two) times daily with a meal.  [provider]  Probiotic Product (ALIGN) 4 MG CAPS Take 1 capsule by mouth daily as needed (for diarrhea).    [provider]  vitamin C (ASCORBIC ACID) 500 MG tablet Take 1 tablet by mouth daily.    [provider]      VITAL SIGNS:  Blood pressure (!) 195/82, pulse 62, temperature (!) 97.5 F (36.4 C), temperature source Oral, resp. rate 18, height 6\' 1"  (1.854 m), weight 82.6 kg (182 lb), SpO2 99 %.  PHYSICAL EXAMINATION:  GENERAL:  72 y.o.-year-old patient lying in the bed with no acute distress.  EYES: Pupils equal, round, reactive to light and accommodation. No scleral icterus. Extraocular muscles  intact.  When moving his eyes down he did have some bobbing of the eyes.  Vision each eye when covered is 20/200 without his reading glasses.  With both eyes together unable to see anything on the page. HEENT: Head atraumatic, normocephalic. Oropharynx and nasopharynx clear.  NECK:  Supple, no jugular venous distention. No thyroid enlargement, no tenderness.  LUNGS: Normal breath sounds bilaterally, no wheezing, rales,rhonchi or crepitation. No use of accessory muscles of respiration.  CARDIOVASCULAR: S1, S2 normal. No murmurs, rubs, or gallops.  ABDOMEN: Soft, nontender, nondistended. Bowel sounds present. No organomegaly or mass.  EXTREMITIES: No pedal edema, cyanosis, or clubbing.  NEUROLOGIC: Cranial nerves II through XII are intact. Muscle strength 5/5 in all extremities. Sensation intact. Gait not checked.  PSYCHIATRIC: The patient is alert and oriented x 3.  SKIN: No rash, lesion, or ulcer.   LABORATORY PANEL:   CBC Recent Labs  Lab 10/27/17 1237  WBC 5.2  HGB 13.6  HCT 38.7*  PLT 131*   ------------------------------------------------------------------------------------------------------------------  Chemistries  Recent Labs  Lab 10/27/17 1237  NA 139  K 4.0  CL 101  CO2 28  GLUCOSE 225*  BUN 17  CREATININE 1.31*  CALCIUM 9.2  AST 20  ALT 14*  ALKPHOS 62  BILITOT 3.1*   ------------------------------------------------------------------------------------------------------------------    RADIOLOGY:  Ct Head Wo Contrast  Result Date: 10/27/2017 CLINICAL DATA:  Double vision for 1 week.  Recent fall. EXAM: CT HEAD WITHOUT CONTRAST TECHNIQUE: Contiguous axial images were obtained from the base of the skull through the vertex without intravenous contrast. COMPARISON:  None. FINDINGS: Brain: No evidence of acute infarction, hemorrhage, hydrocephalus, extra-axial collection or mass lesion/mass effect. Vascular: Atherosclerosis. Skull: Intact. Sinuses/Orbits: Negative.  Other: None. IMPRESSION: No acute abnormality. Atherosclerosis. Electronically Signed   By: Inge Rise M.D.   On: 10/27/2017 13:22    EKG:  Ordered by me  IMPRESSION AND PLAN:   1.  Binocular diplopia.  Case discussed with neurology Dr. Velia Meyer and this is a brainstem stroke until proven otherwise.  MRI of the brain, MRA of the brain, carotid ultrasound and echocardiogram and telemetry monitoring.  Change aspirin to 325 mg daily.  If this turns out to be a stroke likely will end up needing Plavix since the patient takes 81 mg aspirin at home.  The patient has an allergy to atorvastatin.  If MRI of the brain is negative can consider steroids and temporal artery biopsy.  Temporal arteritis less likely with sedimentation rate only 35.  I did not want to give Plavix yet until I know this is a stroke just in case temporal artery biopsy needed. 2.  Accelerated hypertension.  Allow permissive hypertension today.  IV blood pressure medications if systolic blood pressure greater than 195 or diastolic blood pressure greater than 120. 3.  Type  2 diabetes.  Continue 70/30 insulin and Glucophage.  Hold glipizide. Check a hemoglobin A1c.  Place on sliding scale insulin. 4.  Chronic thrombocytopenia.  Check a hepatitis C 5.  History of COPD respiratory status stable  I have reviewed the patient's laboratory data and CT scan of the head.  EKG ordered by me.  Management plans discussed with the patient, family and they are in agreement.  CODE STATUS: Full code  TOTAL TIME TAKING CARE OF THIS PATIENT: 50 minutes.    Loletha Grayer M.D on 10/27/2017 at 3:11 PM  Between 7am to 6pm - Pager - [phone]  After 6pm call admission pager [phone]  [facility] Office  [phone]  CC: Primary care physician; Dr. Harrel Lemon

## 2017-10-27 NOTE — ED Notes (Signed)
Pt's sister to go move car and meet patient in inpatient room.  Sister took patient's personal belongings, including black tennis shoes, keys, 2 shirts, belt, etc in bag.

## 2017-10-28 ENCOUNTER — Inpatient Hospital Stay: Payer: Medicare Other

## 2017-10-28 DIAGNOSIS — H532 Diplopia: Secondary | ICD-10-CM | POA: Diagnosis not present

## 2017-10-28 DIAGNOSIS — I160 Hypertensive urgency: Secondary | ICD-10-CM | POA: Diagnosis not present

## 2017-10-28 LAB — BASIC METABOLIC PANEL
Anion gap: 11 (ref 5–15)
BUN: 26 mg/dL — ABNORMAL HIGH (ref 6–20)
CHLORIDE: 102 mmol/L (ref 101–111)
CO2: 25 mmol/L (ref 22–32)
CREATININE: 1.19 mg/dL (ref 0.61–1.24)
Calcium: 8.8 mg/dL — ABNORMAL LOW (ref 8.9–10.3)
GFR calc Af Amer: >60 mL/min (ref >60)
GFR, EST NON AFRICAN AMERICAN: 59 mL/min — AB (ref >60)
GLUCOSE: 285 mg/dL — AB (ref 65–99)
POTASSIUM: 4.4 mmol/L (ref 3.5–5.1)
SODIUM: 138 mmol/L (ref 135–145)

## 2017-10-28 LAB — LIPID PANEL
CHOLESTEROL: 156 mg/dL (ref 0–200)
HDL: 60 mg/dL (ref >40)
LDL Cholesterol: 88 mg/dL (ref 0–99)
TRIGLYCERIDES: 39 mg/dL (ref <150)
Total CHOL/HDL Ratio: 2.6 RATIO
VLDL: 8 mg/dL (ref 0–40)

## 2017-10-28 LAB — CBC
HEMATOCRIT: 36.8 % — AB (ref 40.0–52.0)
HEMOGLOBIN: 13.1 g/dL (ref 13.0–18.0)
MCH: 32.7 pg (ref 26.0–34.0)
MCHC: 35.7 g/dL (ref 32.0–36.0)
MCV: 91.8 fL (ref 80.0–100.0)
Platelets: 114 10*3/uL — ABNORMAL LOW (ref 150–440)
RBC: 4.01 MIL/uL — ABNORMAL LOW (ref 4.40–5.90)
RDW: 13.7 % (ref 11.5–14.5)
WBC: 4 10*3/uL (ref 3.8–10.6)

## 2017-10-28 LAB — ECHOCARDIOGRAM COMPLETE
Height: 73 in
Weight: 2712.54 oz

## 2017-10-28 LAB — GLUCOSE, CAPILLARY
Glucose-Capillary: 294 mg/dL — ABNORMAL HIGH (ref 65–99)
Glucose-Capillary: 304 mg/dL — ABNORMAL HIGH (ref 65–99)

## 2017-10-28 LAB — HEPATITIS C ANTIBODY

## 2017-10-28 MED ORDER — AMLODIPINE BESYLATE 10 MG PO TABS: 10.0000 mg | ORAL_TABLET | Freq: Every day | ORAL | 0 refills | Status: DC

## 2017-10-28 NOTE — Consult Note (Signed)
Referring Physician: Sudini    Chief Complaint: Diplopia  HPI: Rick Zuniga is an 72 y.o. male with poorly controlled DM and HTN who reports that about a week ago he began to have vertical diplopia.  Also reports pain at the right temple to the point that he was unable to touch the temple.  With no improvement in symptoms went to the eye doctor who sent the patient to the ED for further evaluation.  Initial NIHSS of 1.    Date last known well: Unable to determine Time last known well: Unable to determine tPA Given: No: Unable to determine LKW  Past Medical History:  Diagnosis Date  . Arthritis   . COPD (chronic obstructive pulmonary disease) ([place])   . Diabetes ([place])   . Heart disease   . Indigestion   . Thrombocytopenia ([place])     Past Surgical History:  Procedure Laterality Date  . APPENDECTOMY    . CARDIAC SURGERY     bypass  . CHOLECYSTECTOMY    . COLONOSCOPY  12/23/2010    Family History  Problem Relation Age of Onset  . CAD Mother   . Kidney failure Father   . Diabetes Father    Social History:  reports that  has never smoked. he has never used smokeless tobacco. He reports that he does not drink alcohol or use drugs.  Allergies:  Allergies  Allergen Reactions  . Pioglitazone Swelling  . Atorvastatin Rash  . Benazepril Rash  . Latex Rash  . Tape Rash    Medications:  I have reviewed the patient's current medications. Prior to Admission:  Medications Prior to Admission  Medication Sig Dispense Refill Last Dose  . aspirin EC 81 MG tablet Take 1 tablet by mouth daily.   10/26/2017 at AM  . glipiZIDE (GLUCOTROL) 10 MG tablet Take 1 tablet by mouth 2 (two) times daily.   10/26/2017 at PM  . insulin NPH-regular Human (NOVOLIN 70/30) (70-30) 100 UNIT/ML injection Inject 15-20 Units into the skin at bedtime.    10/26/2017 at PM  . metFORMIN (GLUCOPHAGE) 500 MG tablet Take 500 mg by mouth 2 (two) times daily with a meal.   10/26/2017 at PM   Scheduled: .  amLODipine  5 mg Oral Daily  . aspirin EC  325 mg Oral Daily  . chlorthalidone  25 mg Oral Daily  . enoxaparin (LOVENOX) injection  40 mg Subcutaneous Q24H  . insulin aspart  0-5 Units Subcutaneous QHS  . insulin aspart  0-9 Units Subcutaneous TID WC  . insulin aspart protamine- aspart  20 Units Subcutaneous BID WC  . metFORMIN  500 mg Oral BID WC  . predniSONE  60 mg Oral Q breakfast  . vitamin C  500 mg Oral Daily    ROS: History obtained from the patient  General ROS: negative for - chills, fatigue, fever, night sweats, weight gain or weight loss Psychological ROS: negative for - behavioral disorder, hallucinations, memory difficulties, mood swings or suicidal ideation Ophthalmic ROS: as noted in HPI ENT ROS: negative for - epistaxis, nasal discharge, oral lesions, sore throat, tinnitus or vertigo Allergy and Immunology ROS: negative for - hives or itchy/watery eyes Hematological and Lymphatic ROS: negative for - bleeding problems, bruising or swollen lymph nodes Endocrine ROS: negative for - galactorrhea, hair pattern changes, polydipsia/polyuria or temperature intolerance Respiratory ROS: negative for - cough, hemoptysis, shortness of breath or wheezing Cardiovascular ROS: negative for - chest pain, dyspnea on exertion, edema or irregular heartbeat Gastrointestinal ROS:  negative for - abdominal pain, diarrhea, hematemesis, nausea/vomiting or stool incontinence Genito-Urinary ROS: negative for - dysuria, hematuria, incontinence or urinary frequency/urgency Musculoskeletal ROS: negative for - joint swelling or muscular weakness Neurological ROS: as noted in HPI Dermatological ROS: negative for rash and skin lesion changes  Physical Examination: Blood pressure (!) 154/86, pulse 95, temperature 98.2 F (36.8 C), temperature source Oral, resp. rate 16, height '6\' 1"'$  (1.854 m), weight 76.9 kg (169 lb 8.5 oz), SpO2 98 %.  HEENT-  Normocephalic, no lesions, without obvious abnormality.   Normal external eye and conjunctiva.  Normal TM's bilaterally.  Normal auditory canals and external ears. Normal external nose, mucus membranes and septum.  Normal pharynx. Cardiovascular- S1, S2 normal, pulses palpable throughout   Lungs- chest clear, no wheezing, rales, normal symmetric air entry Abdomen- soft, non-tender; bowel sounds normal; no masses,  no organomegaly Extremities- no edema Lymph-no adenopathy palpable Musculoskeletal-no joint tenderness, deformity or swelling Skin-warm and dry, no hyperpigmentation, vitiligo, or suspicious lesions  Neurological Examination   Mental Status: Alert, oriented, thought content appropriate.  Speech fluent without evidence of aphasia.  Able to follow 3 step commands without difficulty. Cranial Nerves: II: Discs flat bilaterally; Visual fields grossly normal, pupils equal, round, reactive to light and accommodation III,IV, VI: ptosis not present, extra-ocular motions intact bilaterally V,VII: smile symmetric, facial light touch sensation normal bilaterally VIII: hearing normal bilaterally IX,X: gag reflex present XI: bilateral shoulder shrug XII: midline tongue extension Motor: Right : Upper extremity   5/5    Left:     Upper extremity   5/5  Lower extremity   5/5     Lower extremity   5/5 Tone and bulk:normal tone throughout; no atrophy noted Sensory: Pinprick and light touch intact throughout, bilaterally Deep Tendon Reflexes: 2+ and symmetric with absent AJ's bilaterally Plantars: Right: mute   Left: mute Cerebellar: Normal finger-to-nose and normal heel-to-shin testing bilaterally Gait: not tested due to safety concerns   Laboratory Studies:  Basic Metabolic Panel: Recent Labs  Lab 10/27/17 1237 10/28/17 0510  NA 139 138  K 4.0 4.4  CL 101 102  CO2 28 25  GLUCOSE 225* 285*  BUN 17 26*  CREATININE 1.31* 1.19  CALCIUM 9.2 8.8*    Liver Function Tests: Recent Labs  Lab 10/27/17 1237  AST 20  ALT 14*  ALKPHOS 62   BILITOT 3.1*  PROT 7.1  ALBUMIN 4.1   No results for input(s): LIPASE, AMYLASE in the last 168 hours. No results for input(s): AMMONIA in the last 168 hours.  CBC: Recent Labs  Lab 10/27/17 1237 10/28/17 0510  WBC 5.2 4.0  HGB 13.6 13.1  HCT 38.7* 36.8*  MCV 92.1 91.8  PLT 131* 114*    Cardiac Enzymes: No results for input(s): CKTOTAL, CKMB, CKMBINDEX, TROPONINI in the last 168 hours.  BNP: Invalid input(s): POCBNP  CBG: Recent Labs  Lab 10/27/17 1702 10/27/17 2005 10/27/17 2201 10/28/17 0723  GLUCAP 205* 254* 230* 294*    Microbiology: No results found for this or any previous visit.  Coagulation Studies: No results for input(s): LABPROT, INR in the last 72 hours.  Urinalysis: No results for input(s): COLORURINE, LABSPEC, PHURINE, GLUCOSEU, HGBUR, BILIRUBINUR, KETONESUR, PROTEINUR, UROBILINOGEN, NITRITE, LEUKOCYTESUR in the last 168 hours.  Invalid input(s): APPERANCEUR  Lipid Panel:    Component Value Date/Time   CHOL 156 10/28/2017 0510   TRIG 39 10/28/2017 0510   HDL 60 10/28/2017 0510   CHOLHDL 2.6 10/28/2017 0510   VLDL 8 10/28/2017  0510   LDLCALC 88 10/28/2017 0510    HgbA1C:  Lab Results  Component Value Date   HGBA1C 9.0 (H) 10/27/2017    Urine Drug Screen:  No results found for: LABOPIA, COCAINSCRNUR, LABBENZ, AMPHETMU, THCU, LABBARB  Alcohol Level: No results for input(s): ETH in the last 168 hours.   Imaging: Ct Head Wo Contrast  Result Date: 10/27/2017 CLINICAL DATA:  Double vision for 1 week.  Recent fall. EXAM: CT HEAD WITHOUT CONTRAST TECHNIQUE: Contiguous axial images were obtained from the base of the skull through the vertex without intravenous contrast. COMPARISON:  None. FINDINGS: Brain: No evidence of acute infarction, hemorrhage, hydrocephalus, extra-axial collection or mass lesion/mass effect. Vascular: Atherosclerosis. Skull: Intact. Sinuses/Orbits: Negative. Other: None. IMPRESSION: No acute abnormality. Atherosclerosis.  Electronically Signed   By: Inge Rise M.D.   On: 10/27/2017 13:22   Mr Jodene Nam Head Wo Contrast  Result Date: 10/27/2017 CLINICAL DATA:  Focal neuro deficit suspect stroke.  Diplopia EXAM: MRI HEAD WITHOUT CONTRAST MRA HEAD WITHOUT CONTRAST TECHNIQUE: Multiplanar, multiecho pulse sequences of the brain and surrounding structures were obtained without intravenous contrast. Angiographic images of the head were obtained using MRA technique without contrast. COMPARISON:  CT head 10/27/2017 FINDINGS: MRI HEAD FINDINGS Brain: Negative for acute infarct. Mild chronic white matter changes consistent with microvascular chronic ischemia. Negative for hemorrhage or mass. Ventricle size normal. Vascular: Normal arterial flow void Skull and upper cervical spine: Negative Sinuses/Orbits: Negative Other: None MRA HEAD FINDINGS Both vertebral arteries patent to the basilar. PICA not visualized due to patient positioning. AICA, superior cerebellar, posterior cerebral arteries normal Internal carotid artery normal bilaterally. Anterior and middle cerebral arteries normal bilaterally. Negative for cerebral aneurysm. IMPRESSION: Negative for acute infarct. Mild chronic microvascular ischemic change in the white matter Negative MRA head Electronically Signed   By: Franchot Gallo M.D.   On: 10/27/2017 16:33   Mr Brain Wo Contrast  Result Date: 10/27/2017 CLINICAL DATA:  Focal neuro deficit suspect stroke.  Diplopia EXAM: MRI HEAD WITHOUT CONTRAST MRA HEAD WITHOUT CONTRAST TECHNIQUE: Multiplanar, multiecho pulse sequences of the brain and surrounding structures were obtained without intravenous contrast. Angiographic images of the head were obtained using MRA technique without contrast. COMPARISON:  CT head 10/27/2017 FINDINGS: MRI HEAD FINDINGS Brain: Negative for acute infarct. Mild chronic white matter changes consistent with microvascular chronic ischemia. Negative for hemorrhage or mass. Ventricle size normal. Vascular:  Normal arterial flow void Skull and upper cervical spine: Negative Sinuses/Orbits: Negative Other: None MRA HEAD FINDINGS Both vertebral arteries patent to the basilar. PICA not visualized due to patient positioning. AICA, superior cerebellar, posterior cerebral arteries normal Internal carotid artery normal bilaterally. Anterior and middle cerebral arteries normal bilaterally. Negative for cerebral aneurysm. IMPRESSION: Negative for acute infarct. Mild chronic microvascular ischemic change in the white matter Negative MRA head Electronically Signed   By: Franchot Gallo M.D.   On: 10/27/2017 16:33   US Carotid Bilateral  Result Date: 10/28/2017 CLINICAL DATA:  Stroke EXAM: BILATERAL CAROTID DUPLEX ULTRASOUND TECHNIQUE: Pearline Cables scale imaging, color Doppler and duplex ultrasound were performed of bilateral carotid and vertebral arteries in the neck. COMPARISON:  None. FINDINGS: Criteria: Quantification of carotid stenosis is based on velocity parameters that correlate the residual internal carotid diameter with NASCET-based stenosis levels, using the diameter of the distal internal carotid lumen as the denominator for stenosis measurement. The following velocity measurements were obtained: RIGHT ICA:  108 cm/sec CCA:  87 cm/sec SYSTOLIC ICA/CCA RATIO:  1.2 DIASTOLIC ICA/CCA RATIO:  1.2  ECA:  144 cm/sec LEFT ICA:  82 cm/sec CCA:  712 cm/sec SYSTOLIC ICA/CCA RATIO:  0.7 DIASTOLIC ICA/CCA RATIO:  2.0 ECA:  100 cm/sec RIGHT CAROTID ARTERY: Mild predominately soft plaque in the bulb. Low resistance internal carotid Doppler pattern. RIGHT VERTEBRAL ARTERY:  Antegrade. LEFT CAROTID ARTERY: Minimal intimal thickening in the bulb. Low resistance internal carotid Doppler pattern. LEFT VERTEBRAL ARTERY:  Antegrade. IMPRESSION: Less than 50% stenosis in the right and left internal carotid arteries. Electronically Signed   By: Marybelle Killings M.D.   On: 10/28/2017 09:21    Assessment: 72 y.o. male who presents with right temporal  pain and diplopia.  Symptoms now resolved.  Patient started on steroids for probable TA.  MRI of the brain reviewed and shows no acute changes.  MRA shows no evidence of aneurysm.  CRP and ESR (<0.8 and 35 respectively) are normal.  Patient with poorly controlled DM (A1c 9.0) and HTN.  Isolated CN abnormalities can be seen with CN abnormalities as well as TA.  It is interesting that all symptoms resolved after one dose of Prednisone.  With improvement in symptoms patient not inclined to have temporal artery biopsy.     Stroke Risk Factors - diabetes mellitus and hypertension  Plan: 1. Would discontinue steroids.  If symptoms recur would recommend biopsy.  Would like to reduced potential side effects of steroids if possible particularly since patient already with poorly controlled DM.  May be performed as an outpatient.   2. Agree with BP and blood sugar control 3. Follow CRP and ESR as an outpatient.     Alexis Goodell, MD Neurology [phone] 10/28/2017, 12:33 PM

## 2017-10-28 NOTE — Plan of Care (Signed)
  Progressing Education: Knowledge of General Education information will improve 10/28/2017 0215 - Progressing by Johnna Acosta, RN Health Behavior/Discharge Planning: Ability to manage health-related needs will improve 10/28/2017 0215 - Progressing by Johnna Acosta, RN Clinical Measurements: Ability to maintain clinical measurements within normal limits will improve 10/28/2017 0215 - Progressing by Johnna Acosta, RN Will remain free from infection 10/28/2017 0215 - Progressing by Johnna Acosta, RN Diagnostic test results will improve 10/28/2017 0215 - Progressing by Johnna Acosta, RN Cardiovascular complication will be avoided 10/28/2017 0215 - Progressing by Johnna Acosta, RN Nutrition: Adequate nutrition will be maintained 10/28/2017 0215 - Progressing by Johnna Acosta, RN Coping: Level of anxiety will decrease 10/28/2017 0215 - Progressing by Johnna Acosta, RN Pain Managment: General experience of comfort will improve 10/28/2017 0215 - Progressing by Johnna Acosta, RN Safety: Ability to remain free from injury will improve 10/28/2017 0215 - Progressing by Johnna Acosta, RN Skin Integrity: Risk for impaired skin integrity will decrease 10/28/2017 0215 - Progressing by Johnna Acosta, RN   Not Applicable Clinical Measurements: Respiratory complications will improve [id number]/62/[id number] [id number] - Not Applicable by Johnna Acosta, RN

## 2017-10-28 NOTE — Progress Notes (Signed)
PT Cancellation Note  Patient Details Name: LEE KUANG MRN: [id number] DOB: Mar 24, 1946   Cancelled Treatment:    Reason Eval/Treat Not Completed: Other (comment). Consult received and chart reviewed. Pt with complaints of double vision and still undergoing tests/measures. Pending vascular consult as well for potential biopsy. Will hold at this time until further clarification of POC noted and pt cleared for participation. Will continue to follow.   Jasime Westergren 10/28/2017, 8:24 AM  Greggory Stallion, PT, DPT [phone]

## 2017-10-28 NOTE — Progress Notes (Signed)
Discharge instructions given and went over with patient at bedside. Prescription reviewed. All questions answered. Patient discharged home with family via wheelchair by nursing staff. Madlyn Frankel, RN

## 2017-10-28 NOTE — Discharge Instructions (Signed)
Heart healthy diet  Activity as tolerated  Follow up with your doctor as previously scheduled

## 2017-10-28 NOTE — Progress Notes (Signed)
Contacted MD via text page r/t BG level of 285. Orders state to contact MD if BG level > 180. Awaiting response.

## 2017-10-28 NOTE — Plan of Care (Signed)
  Progressing Education: Knowledge of General Education information will improve 10/28/2017 0215 - Progressing by Johnna Acosta, RN Health Behavior/Discharge Planning: Ability to manage health-related needs will improve 10/28/2017 0215 - Progressing by Johnna Acosta, RN Clinical Measurements: Ability to maintain clinical measurements within normal limits will improve 10/28/2017 0215 - Progressing by Johnna Acosta, RN Will remain free from infection 10/28/2017 0215 - Progressing by Johnna Acosta, RN Diagnostic test results will improve 10/28/2017 0215 - Progressing by Johnna Acosta, RN Cardiovascular complication will be avoided 10/28/2017 0215 - Progressing by Johnna Acosta, RN Nutrition: Adequate nutrition will be maintained 10/28/2017 0215 - Progressing by Johnna Acosta, RN Coping: Level of anxiety will decrease 10/28/2017 0215 - Progressing by Johnna Acosta, RN Pain Managment: General experience of comfort will improve 10/28/2017 0215 - Progressing by Johnna Acosta, RN Safety: Ability to remain free from injury will improve 10/28/2017 0215 - Progressing by Johnna Acosta, RN Skin Integrity: Risk for impaired skin integrity will decrease 10/28/2017 0215 - Progressing by Johnna Acosta, RN Education: Knowledge of disease or condition will improve 10/28/2017 0438 - Progressing by Johnna Acosta, RN Knowledge of secondary prevention will improve 10/28/2017 0438 - Progressing by Johnna Acosta, RN Knowledge of patient specific risk factors addressed and post discharge goals established will improve 10/28/2017 0438 - Progressing by Johnna Acosta, RN Self-Care: Ability to participate in self-care as condition permits will improve 10/28/2017 0438 - Progressing by Johnna Acosta, RN Ability to communicate needs accurately will improve 10/28/2017 0438 - Progressing by Johnna Acosta, RN Nutrition: Risk of aspiration will  decrease 10/28/2017 0438 - Progressing by Johnna Acosta, RN

## 2017-10-28 NOTE — Progress Notes (Signed)
Inpatient Diabetes Program Recommendations  AACE/ADA: New Consensus Statement on Inpatient Glycemic Control (2015)  Target Ranges:  Prepandial:   less than 140 mg/dL      Peak postprandial:   less than 180 mg/dL (1-2 hours)      Critically ill patients:  140 - 180 mg/dL   Results for Rick Zuniga, Rick Zuniga (MRN [id number]) as of 10/28/2017 14:02  Ref. Range 10/27/2017 17:02 10/27/2017 20:05 10/27/2017 22:01 10/28/2017 07:23 10/28/2017 11:56  Glucose-Capillary Latest Ref Range: 65 - 99 mg/dL 205 (H) 254 (H) 230 (H) 294 (H) 304 (H)  Results for Rick Zuniga, Rick Zuniga (MRN [id number]) as of 10/28/2017 14:02  Ref. Range 10/27/2017 12:42  Hemoglobin A1C Latest Ref Range: 4.8 - 5.6 % 9.0 (H)   Review of Glycemic Control  Diabetes history: DM2 Outpatient Diabetes medications: Glipizide 10 mg BID, Metformin 500 mg BID, 70/30 0-20 units BID (in the morning and at bedtime) Current orders for Inpatient glycemic control: 70/30 20 units BID, Novolog 0-9 units TID with meals, Novolog 0-5 units QHS, Metformin 500 mg BID; Prednisone 60 mg QAM  Inpatient Diabetes Program Recommendations: Insulin - Basal: If Prednisone is continued, please consider increasing 70/30 to 25 units BID. HgbA1C: A1C 9% on 10/27/17 indicating an average glucose of 212 mg/dl over the past 2-3 months.   NOTE: Spoke with patient about diabetes and home regimen for diabetes control. Patient reports that he is followed by PCP for diabetes management but his PCP has retired and he has an appointment for Monday 11/02/17 to see his new PCP.  Patient notes that he has Medicare insurance but NO prescription coverage. Patient states that he takes Glipizide 10 mg BID, Metformin 500 mg BID, 70/30 0-20 units BID (in the morning and at bedtime) as an outpatient for diabetes control. Patient gets 70/30 insulin from [facility] for $25 per vial.  Patient reports that he works in [employer] so he is very active everyday and he notes that he has found that when he  takes 70/30 insulin in the morning he frequently has hypoglycemia so he skips the morning dose a lot.  Discussed 70/30 insulin in detail and how it works as well as how it is typically taken. Explained to patient that he should be taking 70/30 insulin with meals and definitely not taking it at bedtime.  Patient admits that he wakes up during the night frequently as well with hypoglycemia.  Discussed how Glipizide, Metformin also work for DM control. Patient reports that most days he is so busy he may only eat once in the evening. Discussed importance of eating at least 3 meals a day to help regulate glucose as well as to help prevent hypoglycemia from occurring.  Patient states that he does not check his glucose like he should but when he does it is 1-2 times per day. Patient states that he has to pay for test strips out of pocket and the strips are expensive. Informed patient he could purchase a Reli-On Prime glucometer at [facility] for $9 and a box of 50 test strips for $9.  Patient states that would be much cheaper for him.   Discussed A1C results (9% on 10/27/17) and explained that his current A1C indicates an average glucose of 212 mg/dl over the past 2-3 months. Discussed glucose and A1C goals. Discussed importance of checking CBGs and maintaining good CBG control to prevent long-term and short-term complications. Explained how hyperglycemia leads to damage within blood vessels which lead to the common complications seen with uncontrolled  diabetes. Stressed to the patient the importance of improving glycemic control to prevent further complications from uncontrolled diabetes. Discussed impact of nutrition, exercise, stress, sickness, and medications on diabetes control. Discussed how Prednisone is contributing to hyperglycemia noted as an inpatient. Explained that he may need to take more insulin while on steroids and then adjust as steroids are tapered. Explained to patient that he needs to be taking DM  medications consistently and if he makes adjustments with insulin dosages he needs to keep a good record of it so his doctor will be able to help him improve DM control and decrease incidents of hypoglycemia he is experiencing.  Patient states he understands he has got to make changes and do better with DM control. Patient expressed fear of hypoglycemia when he takes the insulin as prescribed.  Discussed FreeStyle Libre (flash glucose monitoring sensor) and explained that such a device could provide a lot more information for his doctor to use to make adjustments with DM medications and could provide him with immediate feedback on what his glucose is without him having to stop and perform a finger stick.  Explained that usually with insurance it is still $60-75 per month but if he has no prescription coverage he would likely have to pay cash price. Encouraged patient to check to see how much it would be and to see if they offer a patient assistance program if cost is too high for him.   Encouraged patient to check his glucose at least 3-4 times per day and to keep a log book of glucose readings and exact insulin dosage taken which he will need to take to doctor appointments. Explained how the doctor he follows up with can use the log book to continue to make adjustments with DM medications if needed. Patient verbalized understanding of information discussed and he states that he has no further questions at this time related to diabetes.  Thanks, Barnie Alderman, RN, MSN, CDE Diabetes Coordinator Inpatient Diabetes Program [phone] (Team Pager from 8am to 5pm)

## 2017-10-28 NOTE — Progress Notes (Signed)
Came to see patient for consult for possible temporal artery biopsy.  He was discharged home and this was felt to be low on the differential diagnosis.

## 2017-10-29 SURGERY — BIOPSY TEMPORAL ARTERY
Anesthesia: Choice

## 2017-10-30 NOTE — Discharge Summary (Signed)
[place] [facility] [place] NAME: Rick Zuniga    MR#:  [id number]  DATE OF BIRTH:  1945-11-19  DATE OF ADMISSION:  10/27/2017 ADMITTING PHYSICIAN: Loletha Grayer, MD  DATE OF DISCHARGE: 10/28/2017  4:00 PM  PRIMARY CARE PHYSICIAN: Madelyn Brunner, MD   ADMISSION DIAGNOSIS:  Diplopia [H53.2] Stroke [facility]) [I63.9] Cerebrovascular accident (CVA), unspecified mechanism ([place]) [I63.9]  DISCHARGE DIAGNOSIS:  Active Problems:   Accelerated hypertension   SECONDARY DIAGNOSIS:   Past Medical History:  Diagnosis Date  . Arthritis   . COPD (chronic obstructive pulmonary disease) ([place])   . Diabetes ([place])   . Heart disease   . Indigestion   . Thrombocytopenia ([place])      ADMITTING HISTORY   HISTORY OF PRESENT ILLNESS:  Rick Zuniga  is a 72 y.o. male with a known history of diabetes.  He has been having double vision for the last week.  He states that he sees double vision one on top of the other when using both eyes.  He can cover either eye and see okay.  He normally uses reading glasses.  He complains of some pain right outside his right eye going on for the past week.  He saw the eye doctor today and was sent into the ER.  In the ER, his sedimentation rate was 35.  Case discussed with Dr. 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  Admission for stroke.    HOSPITAL COURSE:   *Diplopia.  Initial consideration for CVA.  Patient was admitted to stroke floor.  MRI of the brain was negative.  After this temporal arteritis was considered and started on prednisone.  Even prior to starting prednisone patient's symptoms were improving and completely resolved by the day of discharge.  Unlikely to be temporal arteritis.  ESR normal.  Seen by neurology.  Suggested outpatient follow-up.  If symptoms recur patient will need temporal artery biopsy.  *Hypertension, urgency.  Likely cause of patient's  symptoms.  Home medications were reviewed.  Patient was started on blood pressure medications with good improvement.  Symptoms resolved.  Prescriptions given and discharged home to follow-up with primary care physician.  *Uncontrolled diabetes mellitus with hyperglycemia in the hospital due to steroid use.  This has been discontinued.  Resume home medications.  Follow-up with primary care physician.  Patient discharged home in stable condition.  Symptoms have all resolved.  CONSULTS OBTAINED:  Treatment Team:  Leotis Pain, MD Algernon Huxley, MD  DRUG ALLERGIES:   Allergies  Allergen Reactions  . Pioglitazone Swelling  . Atorvastatin Rash  . Benazepril Rash  . Latex Rash  . Tape Rash    DISCHARGE MEDICATIONS:   Allergies as of 10/28/2017      Reactions   Pioglitazone Swelling   Atorvastatin Rash   Benazepril Rash   Latex Rash   Tape Rash      Medication List    TAKE these medications   amLODipine 10 MG tablet Commonly known as:  NORVASC Take 1 tablet (10 mg total) by mouth daily.   aspirin EC 81 MG tablet Take 1 tablet by mouth daily.   glipiZIDE 10 MG tablet Commonly known as:  GLUCOTROL Take 1 tablet by mouth 2 (two) times daily.   insulin NPH-regular Human (70-30) 100 UNIT/ML injection Commonly known as:  NOVOLIN 70/30 Inject 15-20 Units into the skin at bedtime.   metFORMIN 500 MG tablet Commonly known as:  GLUCOPHAGE  Take 500 mg by mouth 2 (two) times daily with a meal.       Today   VITAL SIGNS:  Blood pressure (!) 154/86, pulse 95, temperature 98.2 F (36.8 C), temperature source Oral, resp. rate 16, height 6' 1" (1.854 m), weight 76.9 kg (169 lb 8.5 oz), SpO2 98 %.  I/O:  No intake or output data in the 24 hours ending 10/30/17 2006  PHYSICAL EXAMINATION:  Physical Exam  GENERAL:  72 y.o.-year-old patient lying in the bed with no acute distress.  LUNGS: Normal breath sounds bilaterally, no wheezing, rales,rhonchi or crepitation. No use  of accessory muscles of respiration.  CARDIOVASCULAR: S1, S2 normal. No murmurs, rubs, or gallops.  ABDOMEN: Soft, non-tender, non-distended. Bowel sounds present. No organomegaly or mass.  NEUROLOGIC: Moves all 4 extremities. PSYCHIATRIC: The patient is alert and oriented x 3.  SKIN: No obvious rash, lesion, or ulcer.   DATA REVIEW:   CBC Recent Labs  Lab 10/28/17 0510  WBC 4.0  HGB 13.1  HCT 36.8*  PLT 114*    Chemistries  Recent Labs  Lab 10/27/17 1237 10/28/17 0510  NA 139 138  K 4.0 4.4  CL 101 102  CO2 28 25  GLUCOSE 225* 285*  BUN 17 26*  CREATININE 1.31* 1.19  CALCIUM 9.2 8.8*  AST 20  --   ALT 14*  --   ALKPHOS 62  --   BILITOT 3.1*  --     Cardiac Enzymes No results for input(s): TROPONINI in the last 168 hours.  Microbiology Results  No results found for this or any previous visit.  RADIOLOGY:  No results found.  Follow up with PCP in 1 week.  Management plans discussed with the patient, family and they are in agreement.  CODE STATUS:  Code Status History    Date Active Date Inactive Code Status Order ID Comments User Context   10/27/2017 15:29 10/28/2017 19:26 Full Code [id number]  Loletha Grayer, MD ED      TOTAL TIME TAKING CARE OF THIS PATIENT ON DAY OF DISCHARGE: more than 30 minutes.   Neita Carp M.D on 10/30/2017 at 8:06 PM  Between 7am to 6pm - Pager - [phone]  After 6pm go to [email or web address] - password [email or web address] [facility]  Office  [phone]  CC: Primary care physician; Madelyn Brunner, MD  Note: This dictation was prepared with Dragon dictation along with smaller phrase technology. Any transcriptional errors that result from this process are unintentional.

## 2017-11-02 DIAGNOSIS — H532 Diplopia: Secondary | ICD-10-CM | POA: Insufficient documentation

## 2017-11-04 ENCOUNTER — Telehealth: Payer: Self-pay

## 2017-11-04 NOTE — Telephone Encounter (Signed)
Flagged on EMMI report for not reading discharge papers.  Called and spoke with patient.   He states he does have the papers, but hasn't had a chance to read them yet due to some recent vision issues related to his hospital admission.  He mentioned that his vision is improving and hopes to be able to read over them soon.  Stated that between his visit with [place] and following up with his PCP, there seems to be a good handle on things.  He provided feedback that he had a good stay while at the hospital and that the staff and doctors were great.  I thanked him for his feedback and for his time.

## 2017-12-14 DIAGNOSIS — R600 Localized edema: Secondary | ICD-10-CM | POA: Insufficient documentation

## 2018-08-20 ENCOUNTER — Emergency Department
Admission: EM | Admit: 2018-08-20 | Discharge: 2018-08-20 | Disposition: A | Discharge status: Home / Self Care | Payer: No Typology Code available for payment source | Attending: Emergency Medicine | Admitting: Emergency Medicine

## 2018-08-20 ENCOUNTER — Emergency Department: Payer: No Typology Code available for payment source

## 2018-08-20 ENCOUNTER — Other Ambulatory Visit: Payer: Self-pay

## 2018-08-20 DIAGNOSIS — Z7982 Long term (current) use of aspirin: Secondary | ICD-10-CM | POA: Diagnosis not present

## 2018-08-20 DIAGNOSIS — M25551 Pain in right hip: Secondary | ICD-10-CM | POA: Insufficient documentation

## 2018-08-20 DIAGNOSIS — M25552 Pain in left hip: Secondary | ICD-10-CM | POA: Diagnosis not present

## 2018-08-20 DIAGNOSIS — Z794 Long term (current) use of insulin: Secondary | ICD-10-CM | POA: Diagnosis not present

## 2018-08-20 DIAGNOSIS — J449 Chronic obstructive pulmonary disease, unspecified: Secondary | ICD-10-CM | POA: Diagnosis not present

## 2018-08-20 DIAGNOSIS — E119 Type 2 diabetes mellitus without complications: Secondary | ICD-10-CM | POA: Insufficient documentation
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
## 2018-08-20 NOTE — Discharge Instructions (Addendum)
Please seek medical attention for any high fevers, chest pain, shortness of breath, change in behavior, persistent vomiting, bloody stool or any other new or concerning symptoms.  

## 2018-10-17 DIAGNOSIS — M5416 Radiculopathy, lumbar region: Secondary | ICD-10-CM | POA: Insufficient documentation

## 2018-11-11 DIAGNOSIS — N528 Other male erectile dysfunction: Secondary | ICD-10-CM | POA: Insufficient documentation

## 2018-11-30 ENCOUNTER — Other Ambulatory Visit: Payer: Self-pay | Admitting: Family Medicine

## 2018-11-30 ENCOUNTER — Other Ambulatory Visit: Payer: Self-pay

## 2018-11-30 ENCOUNTER — Ambulatory Visit
Admission: RE | Admit: 2018-11-30 | Discharge: 2018-11-30 | Disposition: A | Discharge status: Home / Self Care | Payer: Medicare Other | Source: Ambulatory Visit | Attending: Family Medicine | Admitting: Family Medicine

## 2018-11-30 DIAGNOSIS — M546 Pain in thoracic spine: Secondary | ICD-10-CM | POA: Insufficient documentation

## 2019-04-15 ENCOUNTER — Other Ambulatory Visit: Payer: Self-pay | Admitting: Internal Medicine

## 2019-04-15 DIAGNOSIS — R1031 Right lower quadrant pain: Secondary | ICD-10-CM

## 2019-04-19 ENCOUNTER — Ambulatory Visit: Admission: RE | Admit: 2019-04-19 | Payer: Medicare Other | Source: Ambulatory Visit

## 2019-04-19 ENCOUNTER — Other Ambulatory Visit: Payer: Self-pay

## 2019-04-19 ENCOUNTER — Ambulatory Visit
Admission: RE | Admit: 2019-04-19 | Discharge: 2019-04-19 | Disposition: A | Discharge status: Home / Self Care | Payer: Medicare Other | Source: Ambulatory Visit | Attending: Internal Medicine | Admitting: Internal Medicine

## 2019-04-19 DIAGNOSIS — R1031 Right lower quadrant pain: Secondary | ICD-10-CM | POA: Insufficient documentation

## 2019-04-19 HISTORY — DX: Essential (primary) hypertension: I10

## 2019-04-19 MED ORDER — IOHEXOL 300 MG/ML  SOLN
75.0000 mL | Freq: Once | INTRAMUSCULAR | Status: AC | PRN
  Administered 2019-04-19: 13:00:00 75 mL via INTRAVENOUS

## 2019-05-09 DIAGNOSIS — R1011 Right upper quadrant pain: Secondary | ICD-10-CM | POA: Insufficient documentation

## 2019-05-09 DIAGNOSIS — K591 Functional diarrhea: Secondary | ICD-10-CM | POA: Insufficient documentation

## 2019-05-09 DIAGNOSIS — R1310 Dysphagia, unspecified: Secondary | ICD-10-CM | POA: Insufficient documentation

## 2019-05-09 DIAGNOSIS — R1319 Other dysphagia: Secondary | ICD-10-CM | POA: Insufficient documentation

## 2019-05-20 ENCOUNTER — Other Ambulatory Visit: Payer: Self-pay

## 2019-05-20 ENCOUNTER — Other Ambulatory Visit
Admission: RE | Admit: 2019-05-20 | Discharge: 2019-05-20 | Disposition: A | Discharge status: Home / Self Care | Payer: Medicare Other | Source: Ambulatory Visit | Attending: Internal Medicine | Admitting: Internal Medicine

## 2019-05-20 DIAGNOSIS — Z20828 Contact with and (suspected) exposure to other viral communicable diseases: Secondary | ICD-10-CM | POA: Insufficient documentation

## 2019-05-20 DIAGNOSIS — Z01812 Encounter for preprocedural laboratory examination: Secondary | ICD-10-CM | POA: Insufficient documentation

## 2019-05-21 LAB — SARS CORONAVIRUS 2 (TAT 6-24 HRS): SARS Coronavirus 2: NEGATIVE

## 2019-05-25 ENCOUNTER — Other Ambulatory Visit: Payer: Self-pay

## 2019-05-25 ENCOUNTER — Emergency Department: Payer: Medicare Other

## 2019-05-25 ENCOUNTER — Observation Stay
Admission: EM | Admit: 2019-05-25 | Discharge: 2019-05-26 | Disposition: A | Discharge status: Home / Self Care | Payer: Medicare Other | Attending: Internal Medicine | Admitting: Internal Medicine

## 2019-05-25 ENCOUNTER — Encounter: Payer: Self-pay | Admitting: Emergency Medicine

## 2019-05-25 DIAGNOSIS — I200 Unstable angina: Secondary | ICD-10-CM | POA: Diagnosis present

## 2019-05-25 DIAGNOSIS — R1011 Right upper quadrant pain: Secondary | ICD-10-CM | POA: Insufficient documentation

## 2019-05-25 DIAGNOSIS — Z794 Long term (current) use of insulin: Secondary | ICD-10-CM | POA: Diagnosis not present

## 2019-05-25 DIAGNOSIS — Z91040 Latex allergy status: Secondary | ICD-10-CM | POA: Insufficient documentation

## 2019-05-25 DIAGNOSIS — Z20828 Contact with and (suspected) exposure to other viral communicable diseases: Secondary | ICD-10-CM | POA: Diagnosis not present

## 2019-05-25 DIAGNOSIS — Z23 Encounter for immunization: Secondary | ICD-10-CM | POA: Insufficient documentation

## 2019-05-25 DIAGNOSIS — Z888 Allergy status to other drugs, medicaments and biological substances status: Secondary | ICD-10-CM | POA: Diagnosis not present

## 2019-05-25 DIAGNOSIS — R61 Generalized hyperhidrosis: Secondary | ICD-10-CM | POA: Diagnosis not present

## 2019-05-25 DIAGNOSIS — R110 Nausea: Secondary | ICD-10-CM | POA: Insufficient documentation

## 2019-05-25 DIAGNOSIS — R079 Chest pain, unspecified: Principal | ICD-10-CM | POA: Insufficient documentation

## 2019-05-25 DIAGNOSIS — R29818 Other symptoms and signs involving the nervous system: Secondary | ICD-10-CM | POA: Diagnosis not present

## 2019-05-25 DIAGNOSIS — Z79899 Other long term (current) drug therapy: Secondary | ICD-10-CM | POA: Diagnosis not present

## 2019-05-25 DIAGNOSIS — D696 Thrombocytopenia, unspecified: Secondary | ICD-10-CM | POA: Diagnosis not present

## 2019-05-25 DIAGNOSIS — Z8249 Family history of ischemic heart disease and other diseases of the circulatory system: Secondary | ICD-10-CM | POA: Diagnosis not present

## 2019-05-25 DIAGNOSIS — M1990 Unspecified osteoarthritis, unspecified site: Secondary | ICD-10-CM | POA: Insufficient documentation

## 2019-05-25 DIAGNOSIS — Z833 Family history of diabetes mellitus: Secondary | ICD-10-CM | POA: Diagnosis not present

## 2019-05-25 DIAGNOSIS — Z7982 Long term (current) use of aspirin: Secondary | ICD-10-CM | POA: Diagnosis not present

## 2019-05-25 DIAGNOSIS — I119 Hypertensive heart disease without heart failure: Secondary | ICD-10-CM | POA: Diagnosis not present

## 2019-05-25 DIAGNOSIS — J449 Chronic obstructive pulmonary disease, unspecified: Secondary | ICD-10-CM | POA: Insufficient documentation

## 2019-05-25 DIAGNOSIS — E119 Type 2 diabetes mellitus without complications: Secondary | ICD-10-CM | POA: Insufficient documentation

## 2019-05-25 LAB — CBC
HCT: 31.4 % — ABNORMAL LOW (ref 39.0–52.0)
Hemoglobin: 11.4 g/dL — ABNORMAL LOW (ref 13.0–17.0)
MCH: 32.4 pg (ref 26.0–34.0)
MCHC: 36.3 g/dL — ABNORMAL HIGH (ref 30.0–36.0)
MCV: 89.2 fL (ref 80.0–100.0)
Platelets: 126 10*3/uL — ABNORMAL LOW (ref 150–400)
RBC: 3.52 MIL/uL — ABNORMAL LOW (ref 4.22–5.81)
RDW: 13.2 % (ref 11.5–15.5)
WBC: 4.8 10*3/uL (ref 4.0–10.5)
nRBC: 0 % (ref 0.0–0.2)

## 2019-05-25 LAB — BASIC METABOLIC PANEL
Anion gap: 7 (ref 5–15)
BUN: 23 mg/dL (ref 8–23)
CO2: 26 mmol/L (ref 22–32)
Calcium: 8.9 mg/dL (ref 8.9–10.3)
Chloride: 107 mmol/L (ref 98–111)
Creatinine, Ser: 1.26 mg/dL — ABNORMAL HIGH (ref 0.61–1.24)
GFR calc Af Amer: >60 mL/min (ref >60)
GFR calc non Af Amer: 56 mL/min — ABNORMAL LOW (ref >60)
Glucose, Bld: 217 mg/dL — ABNORMAL HIGH (ref 70–99)
Potassium: 4.7 mmol/L (ref 3.5–5.1)
Sodium: 140 mmol/L (ref 135–145)

## 2019-05-25 LAB — APTT: aPTT: 29 seconds (ref 24–36)

## 2019-05-25 LAB — TROPONIN I (HIGH SENSITIVITY)
Troponin I (High Sensitivity): 21 ng/L — ABNORMAL HIGH (ref <18)
Troponin I (High Sensitivity): 20 ng/L — ABNORMAL HIGH (ref <18)
Troponin I (High Sensitivity): 18 ng/L — ABNORMAL HIGH (ref <18)

## 2019-05-25 LAB — GLUCOSE, CAPILLARY
Glucose-Capillary: 293 mg/dL — ABNORMAL HIGH (ref 70–99)
Glucose-Capillary: 379 mg/dL — ABNORMAL HIGH (ref 70–99)

## 2019-05-25 LAB — PROTIME-INR
INR: 1.1 (ref 0.8–1.2)
Prothrombin Time: 13.6 seconds (ref 11.4–15.2)

## 2019-05-25 MED ORDER — ASPIRIN EC 81 MG PO TBEC
81.0000 mg | DELAYED_RELEASE_TABLET | Freq: Every day | ORAL | Status: DC
  Administered 2019-05-26: 08:00:00 81 mg via ORAL
  Filled 2019-05-25: qty 1

## 2019-05-25 MED ORDER — ALPRAZOLAM 0.25 MG PO TABS
0.2500 mg | ORAL_TABLET | Freq: Two times a day (BID) | ORAL | Status: DC | PRN
  Administered 2019-05-25: 0.25 mg via ORAL
  Filled 2019-05-25: qty 1

## 2019-05-25 MED ORDER — NITROGLYCERIN 0.4 MG SL SUBL: 0.4000 mg | SUBLINGUAL_TABLET | SUBLINGUAL | Status: DC | PRN

## 2019-05-25 MED ORDER — ASPIRIN 81 MG PO CHEW
324.0000 mg | CHEWABLE_TABLET | Freq: Once | ORAL | Status: AC
  Administered 2019-05-25: 14:00:00 324 mg via ORAL
  Filled 2019-05-25: qty 4

## 2019-05-25 MED ORDER — BISACODYL 5 MG PO TBEC: 5.0000 mg | DELAYED_RELEASE_TABLET | Freq: Every day | ORAL | Status: DC | PRN

## 2019-05-25 MED ORDER — ACETAMINOPHEN 500 MG PO TABS
1000.0000 mg | ORAL_TABLET | Freq: Once | ORAL | Status: AC
  Administered 2019-05-25: 14:00:00 1000 mg via ORAL
  Filled 2019-05-25: qty 2

## 2019-05-25 MED ORDER — INSULIN ASPART 100 UNIT/ML ~~LOC~~ SOLN
0.0000 [IU] | Freq: Three times a day (TID) | SUBCUTANEOUS | Status: DC
  Administered 2019-05-26: 7 [IU] via SUBCUTANEOUS
  Administered 2019-05-26: 12:00:00 2 [IU] via SUBCUTANEOUS
  Filled 2019-05-25 (×2): qty 1

## 2019-05-25 MED ORDER — ACETAMINOPHEN 325 MG PO TABS: 650.0000 mg | ORAL_TABLET | ORAL | Status: DC | PRN

## 2019-05-25 MED ORDER — IOHEXOL 350 MG/ML SOLN
100.0000 mL | Freq: Once | INTRAVENOUS | Status: AC | PRN
  Administered 2019-05-25: 14:00:00 100 mL via INTRAVENOUS

## 2019-05-25 MED ORDER — ONDANSETRON HCL 4 MG/2ML IJ SOLN
4.0000 mg | Freq: Once | INTRAMUSCULAR | Status: AC
  Administered 2019-05-25: 14:00:00 4 mg via INTRAVENOUS
  Filled 2019-05-25: qty 2

## 2019-05-25 MED ORDER — NITROGLYCERIN 0.4 MG SL SUBL
0.4000 mg | SUBLINGUAL_TABLET | SUBLINGUAL | Status: DC | PRN
  Administered 2019-05-25: 0.4 mg via SUBLINGUAL
  Filled 2019-05-25: qty 1

## 2019-05-25 MED ORDER — HEPARIN BOLUS VIA INFUSION
4000.0000 [IU] | Freq: Once | INTRAVENOUS | Status: AC
  Administered 2019-05-25: 15:00:00 4000 [IU] via INTRAVENOUS
  Filled 2019-05-25: qty 4000

## 2019-05-25 MED ORDER — ONDANSETRON HCL 4 MG/2ML IJ SOLN
4.0000 mg | Freq: Four times a day (QID) | INTRAMUSCULAR | Status: DC | PRN
  Administered 2019-05-26: 08:00:00 4 mg via INTRAVENOUS
  Filled 2019-05-25: qty 2

## 2019-05-25 MED ORDER — LOSARTAN POTASSIUM 50 MG PO TABS
100.0000 mg | ORAL_TABLET | Freq: Every day | ORAL | Status: DC
  Administered 2019-05-25 – 2019-05-26 (×2): 100 mg via ORAL
  Filled 2019-05-25 (×2): qty 2

## 2019-05-25 MED ORDER — INSULIN ASPART 100 UNIT/ML ~~LOC~~ SOLN
0.0000 [IU] | Freq: Every day | SUBCUTANEOUS | Status: DC
  Administered 2019-05-25: 23:00:00 5 [IU] via SUBCUTANEOUS
  Filled 2019-05-25: qty 1

## 2019-05-25 MED ORDER — AMLODIPINE BESYLATE 10 MG PO TABS
10.0000 mg | ORAL_TABLET | Freq: Every day | ORAL | Status: DC
  Administered 2019-05-25 – 2019-05-26 (×2): 10 mg via ORAL
  Filled 2019-05-25 (×2): qty 1

## 2019-05-25 MED ORDER — HEPARIN (PORCINE) 25000 UT/250ML-% IV SOLN
950.0000 [IU]/h | INTRAVENOUS | Status: DC
  Administered 2019-05-25: 950 [IU]/h via INTRAVENOUS
  Filled 2019-05-25: qty 250

## 2019-05-25 MED ORDER — CARVEDILOL 6.25 MG PO TABS
6.2500 mg | ORAL_TABLET | Freq: Two times a day (BID) | ORAL | Status: DC
  Administered 2019-05-26 (×2): 6.25 mg via ORAL
  Filled 2019-05-25 (×2): qty 1

## 2019-05-25 MED ORDER — ONDANSETRON HCL 4 MG/2ML IJ SOLN: 4.0000 mg | Freq: Four times a day (QID) | INTRAMUSCULAR | Status: DC | PRN

## 2019-05-25 MED ORDER — SENNA 8.6 MG PO TABS: 1.0000 | ORAL_TABLET | Freq: Every day | ORAL | Status: DC | PRN

## 2019-05-25 MED ORDER — ZOLPIDEM TARTRATE 5 MG PO TABS: 5.0000 mg | ORAL_TABLET | Freq: Every evening | ORAL | Status: DC | PRN

## 2019-05-25 MED ORDER — INSULIN ASPART PROT & ASPART (70-30 MIX) 100 UNIT/ML ~~LOC~~ SUSP: 15.0000 [IU] | Freq: Every day | SUBCUTANEOUS | Status: DC

## 2019-05-25 NOTE — ED Notes (Signed)
Attempted to call report

## 2019-05-25 NOTE — ED Provider Notes (Signed)
[facility] Emergency Department Provider Note  ____________________________________________  Time seen: Approximately 3:27 PM  I have reviewed the triage vital signs and the nursing notes.   HISTORY  Chief Complaint Chest Pain    HPI Rick Zuniga is a 73 y.o. male with a history of COPD hypertension diabetes and heart disease who comes the ED complaining of central chest pain described as heaviness, associated with dizziness diaphoresis and shortness of breath.  Worse with exertion, better with rest.  Has been worsening over the past week and coming on with even minimal exertion and sometimes at rest.  He also complains of right lower quadrant abdominal pain and intermittent constipation and diarrhea.  No black or bloody stool.      Past Medical History:  Diagnosis Date  . Arthritis   . COPD (chronic obstructive pulmonary disease) ([place])   . Diabetes ([place])   . Heart disease   . Hypertension   . Indigestion   . Thrombocytopenia [facility])      Patient Active Problem List   Diagnosis Date Noted  . Accelerated hypertension 10/27/2017  . Thrombocytopenia ([place]) 02/28/2016  . Anemia 02/28/2016     Past Surgical History:  Procedure Laterality Date  . APPENDECTOMY    . CARDIAC SURGERY     bypass  . CHOLECYSTECTOMY    . COLONOSCOPY  12/23/2010     Prior to Admission medications   Medication Sig Start Date End Date Taking? Authorizing Provider  amLODipine (NORVASC) 10 MG tablet Take 1 tablet (10 mg total) by mouth daily. 10/28/17 11/27/17  Hillary Bow, MD  aspirin EC 81 MG tablet Take 1 tablet by mouth daily.    [provider]  carvedilol (COREG) 6.25 MG tablet Take 6.25 mg by mouth 2 (two) times daily with a meal.    [provider]  gabapentin (NEURONTIN) 100 MG capsule Take 100 mg by mouth. Nightly for 7 days    [provider]  glipiZIDE (GLUCOTROL) 10 MG tablet Take 1 tablet by mouth 2 (two) times daily. 01/04/16    [provider]  insulin NPH-regular Human (NOVOLIN 70/30) (70-30) 100 UNIT/ML injection Inject 15-20 Units into the skin at bedtime.     [provider]  losartan (COZAAR) 100 MG tablet Take 100 mg by mouth daily.    [provider]  metFORMIN (GLUCOPHAGE) 500 MG tablet Take 500 mg by mouth 2 (two) times daily with a meal.    [provider]  nitroGLYCERIN (NITROSTAT) 0.4 MG SL tablet Place 0.4 mg under the tongue every 5 (five) minutes as needed for chest pain.    [provider]     Allergies Pioglitazone, Atorvastatin, Benazepril, Latex, and Tape   Family History  Problem Relation Age of Onset  . CAD Mother   . Kidney failure Father   . Diabetes Father     Social History Social History   Tobacco Use  . Smoking status: Never Smoker  . Smokeless tobacco: Never Used  Substance Use Topics  . Alcohol use: No    Frequency: Never  . Drug use: No    Review of Systems  Constitutional:   No fever or chills.  ENT:   No sore throat. No rhinorrhea. Cardiovascular: Positive chest pain as above without syncope. Respiratory:   No dyspnea or cough. Gastrointestinal:   Positive lower abdominal pain and diarrhea.  Musculoskeletal:   Negative for focal pain or swelling All other systems reviewed and are negative except as documented  above in ROS and HPI.  ____________________________________________   PHYSICAL EXAM:  VITAL SIGNS: ED Triage Vitals  Enc Vitals Group     BP 05/25/19 1151 (!) 161/79     Pulse Rate 05/25/19 1151 75     Resp 05/25/19 1151 16     Temp 05/25/19 1151 98.2 F (36.8 C)     Temp Source 05/25/19 1151 Oral     SpO2 05/25/19 1151 98 %     Weight 05/25/19 1153 174 lb (78.9 kg)     Height 05/25/19 1153 6' (1.829 m)     Head Circumference --      Peak Flow --      Pain Score 05/25/19 1153 4     Pain Loc --      Pain Edu? --      Excl. in [place]? --     Vital signs reviewed, nursing assessments  reviewed.   Constitutional:   Alert and oriented. Non-toxic appearance. Eyes:   Conjunctivae are normal. EOMI. PERRL. ENT      Head:   Normocephalic and atraumatic.      Nose:   Wearing a mask.      Mouth/Throat:   Wearing a mask.      Neck:   No meningismus. Full ROM. Hematological/Lymphatic/Immunilogical:   No cervical lymphadenopathy. Cardiovascular:   RRR. Symmetric bilateral radial and DP pulses.  No murmurs. Cap refill less than 2 seconds. Respiratory:   Normal respiratory effort without tachypnea/retractions. Breath sounds are clear and equal bilaterally. No wheezes/rales/rhonchi. Gastrointestinal:   Soft with generalized abdominal tenderness, nonfocal. Non distended. There is no CVA tenderness.  No rebound, rigidity, or guarding.  Musculoskeletal:   Normal range of motion in all extremities. No joint effusions.  No lower extremity tenderness.  No edema. Neurologic:   Normal speech and language.  Motor grossly intact. No acute focal neurologic deficits are appreciated.  Skin:    Skin is warm, dry and intact. No rash noted.  No petechiae, purpura, or bullae.  ____________________________________________    LABS (pertinent positives/negatives) (all labs ordered are listed, but only abnormal results are displayed) Labs Reviewed  BASIC METABOLIC PANEL - Abnormal; Notable for the following components:      Result Value   Glucose, Bld 217 (*)    Creatinine, Ser 1.26 (*)    GFR calc non Af Amer 56 (*)    All other components within normal limits  CBC - Abnormal; Notable for the following components:   RBC 3.52 (*)    Hemoglobin 11.4 (*)    HCT 31.4 (*)    MCHC 36.3 (*)    Platelets 126 (*)    All other components within normal limits  TROPONIN I (HIGH SENSITIVITY) - Abnormal; Notable for the following components:   Troponin I (High Sensitivity) 21 (*)    All other components within normal limits  TROPONIN I (HIGH SENSITIVITY) - Abnormal; Notable for the following  components:   Troponin I (High Sensitivity) 20 (*)    All other components within normal limits  SARS CORONAVIRUS 2 (TAT 6-24 HRS)  APTT  PROTIME-INR   ____________________________________________   EKG  Interpreted by me Normal sinus rhythm rate of 79, normal axis intervals QRS ST segments and T waves  ____________________________________________    RADIOLOGY  Dg Chest 2 View  Result Date: 05/25/2019 CLINICAL DATA:  Chest pain EXAM: CHEST - 2 VIEW COMPARISON:  04/19/2019 FINDINGS: Cardiac shadow is within normal limits. Postsurgical changes are noted. Blunting of the  left costophrenic angle is seen which is new from the prior CT examination consistent with small effusion. No focal confluent infiltrate is seen. No bony abnormality is noted. IMPRESSION: Small left pleural effusion. Electronically Signed   By: Inez Catalina M.D.   On: 05/25/2019 14:01   Ct Head Wo Contrast  Result Date: 05/25/2019 CLINICAL DATA:  Subacute neuro deficits. EXAM: CT HEAD WITHOUT CONTRAST TECHNIQUE: Contiguous axial images were obtained from the base of the skull through the vertex without intravenous contrast. COMPARISON:  10/27/2017 FINDINGS: Brain: There is no evidence for acute hemorrhage, hydrocephalus, mass lesion, or abnormal extra-axial fluid collection. No definite CT evidence for acute infarction. Vascular: No hyperdense vessel or unexpected calcification. Skull: No evidence for fracture. No worrisome lytic or sclerotic lesion. Sinuses/Orbits: The visualized paranasal sinuses and mastoid air cells are clear. Visualized portions of the globes and intraorbital fat are unremarkable. Other: None. IMPRESSION: No acute intracranial abnormality. Electronically Signed   By: Misty Stanley M.D.   On: 05/25/2019 14:34   Ct Angio Chest/abd/pel For Dissection W And/or Wo Contrast  Result Date: 05/25/2019 CLINICAL DATA:  Aortic disease, chest pain, right lower quadrant pain EXAM: CT ANGIOGRAPHY CHEST, ABDOMEN AND  PELVIS TECHNIQUE: Multidetector CT imaging through the chest, abdomen and pelvis was performed using the standard protocol during bolus administration of intravenous contrast. Multiplanar reconstructed images and MIPs were obtained and reviewed to evaluate the vascular anatomy. CONTRAST:  149mL OMNIPAQUE IOHEXOL 350 MG/ML SOLN COMPARISON:  None. FINDINGS: CTA CHEST FINDINGS Cardiovascular: Preferential opacification of the thoracic aorta. No evidence of thoracic aortic aneurysm or dissection. Normal heart size. No pericardial effusion. Prior CABG. Extensive coronary artery atherosclerosis. Mild thoracic aortic atherosclerosis. Mediastinum/Nodes: No enlarged mediastinal, hilar, or axillary lymph nodes. Thyroid gland, trachea, and esophagus demonstrate no significant findings. Lungs/Pleura: Small left pleural effusion. Bibasilar mild atelectasis. No focal consolidation. Musculoskeletal: No chest wall abnormality. No acute or significant osseous findings. Review of the MIP images confirms the above findings. CTA ABDOMEN AND PELVIS FINDINGS VASCULAR Aorta: Normal caliber aorta without aneurysm, dissection, vasculitis or significant stenosis. Mild abdominal aortic atherosclerosis. Celiac: Patent without evidence of aneurysm, dissection, vasculitis or significant stenosis. SMA: Patent without evidence of aneurysm, dissection, vasculitis or significant stenosis. Renals: Both renal arteries are patent without evidence of aneurysm, dissection, vasculitis, fibromuscular dysplasia or significant stenosis. IMA: Patent without evidence of aneurysm, dissection, vasculitis or significant stenosis. Inflow: Patent without evidence of aneurysm, dissection, vasculitis or significant stenosis. Veins: No obvious venous abnormality within the limitations of this arterial phase study. Review of the MIP images confirms the above findings. NON-VASCULAR Hepatobiliary: No focal liver abnormality is seen. Status post cholecystectomy. No  biliary dilatation. Pancreas: Unremarkable. No pancreatic ductal dilatation or surrounding inflammatory changes. Spleen: Normal in size without focal abnormality. Adrenals/Urinary Tract: Adrenal glands are unremarkable. Kidneys are normal, without renal calculi, focal lesion, or hydronephrosis. Bladder is unremarkable. Stomach/Bowel: Stomach is within normal limits. No evidence of bowel wall thickening, distention, or inflammatory changes. Diverticulosis without evidence of diverticulitis. Moderate amount of stool throughout the colon. Lymphatic: No lymphadenopathy. Reproductive: Prostate is unremarkable. Other: No abdominal wall hernia or abnormality. No abdominopelvic ascites. Musculoskeletal: No acute osseous abnormality. No aggressive osseous lesion. Review of the MIP images confirms the above findings. IMPRESSION: 1. No aortic aneurysm or dissection. 2. No acute abnormality of the chest, abdomen or pelvis. 3. Small left pleural effusion.  Mild bibasilar atelectasis. 4. Diverticulosis without evidence of diverticulitis. 5. Moderate amount of stool throughout the colon. Electronically Signed  By: Kathreen Devoid   On: 05/25/2019 14:38    ____________________________________________   PROCEDURES .Critical Care Performed by: Carrie Mew, MD Authorized by: Carrie Mew, MD   Critical care provider statement:    Critical care time (minutes):  35   Critical care time was exclusive of:  Separately billable procedures and treating other patients   Critical care was necessary to treat or prevent imminent or life-threatening deterioration of the following conditions:  Cardiac failure   Critical care was time spent personally by me on the following activities:  Development of treatment plan with patient or surrogate, discussions with consultants, evaluation of patient's response to treatment, examination of patient, obtaining history from patient or surrogate, ordering and performing treatments and  interventions, ordering and review of laboratory studies, ordering and review of radiographic studies, pulse oximetry, re-evaluation of patient's condition and review of old charts    ____________________________________________  DIFFERENTIAL DIAGNOSIS   Non-STEMI, unstable angina, pneumonia, aortic dissection, constipation, intracranial hemorrhage  CLINICAL IMPRESSION / ASSESSMENT AND PLAN / ED COURSE  Medications ordered in the ED: Medications  nitroGLYCERIN (NITROSTAT) SL tablet 0.4 mg (0.4 mg Sublingual Given 05/25/19 1351)  heparin bolus via infusion 4,000 Units (4,000 Units Intravenous Bolus from Bag 05/25/19 1522)    Followed by  heparin ADULT infusion 100 units/mL (25000 units/268mL sodium chloride 0.45%) (950 Units/hr Intravenous New Bag/Given 05/25/19 1522)  aspirin chewable tablet 324 mg (324 mg Oral Given 05/25/19 1348)  acetaminophen (TYLENOL) tablet 1,000 mg (1,000 mg Oral Given 05/25/19 1354)  ondansetron (ZOFRAN) injection 4 mg (4 mg Intravenous Given 05/25/19 1352)  iohexol (OMNIPAQUE) 350 MG/ML injection 100 mL (100 mLs Intravenous Contrast Given 05/25/19 1402)    Pertinent labs & imaging results that were available during my care of the patient were reviewed by me and considered in my medical decision making (see chart for details).  Rick Zuniga was evaluated in Emergency Department on 05/25/2019 for the symptoms described in the history of present illness. He was evaluated in the context of the global COVID-19 pandemic, which necessitated consideration that the patient might be at risk for infection with the SARS-CoV-2 virus that causes COVID-19. Institutional protocols and algorithms that pertain to the evaluation of patients at risk for COVID-19 are in a state of rapid change based on information released by regulatory bodies including the CDC and federal and state organizations. These policies and algorithms were followed during the patient's care in the ED.    Patient with multiple comorbidities and known heart disease presents with accelerating exertional chest pain.  Due to the multifocal nonradiating nature, raises suspicion of possible aortic disease.  He also reported headaches.  CT head obtained which is negative for intracranial hemorrhage.  CT chest abdomen pelvis is negative for aortic dissection, does reconfirm constipation as likely source of his abdominal discomfort.  Troponin is essentially negative, but with his symptoms consistent with unstable angina, start on heparin, admit to hospitalist.      ____________________________________________   FINAL CLINICAL IMPRESSION(S) / ED DIAGNOSES    Final diagnoses:  Unstable angina (HCC)  Exertional chest pain     ED Discharge Orders    None      Portions of this note were generated with dragon dictation software. Dictation errors may occur despite best attempts at proofreading.   Carrie Mew, MD 05/25/19 1539

## 2019-05-25 NOTE — ED Notes (Signed)
Patient's wife does not want patient to stay if she is unable to stay with him

## 2019-05-25 NOTE — ED Notes (Signed)
Pt leaving for CT. Will collect covid swab once back.

## 2019-05-25 NOTE — Consult Note (Signed)
Cardiology Consultation Note    Patient ID: Rick Zuniga, MRN: [id number], DOB/AGE: [birth year] 74 y.o. Admit date: 05/25/2019   Date of Consult: 05/25/2019 Primary Physician: [name], MD Primary Cardiologist: Dr. Saralyn Pilar  Chief Complaint: Chest pain Reason for Consultation: chest pain  Requesting MD: Dr. Bridgett Larsson  HPI: Rick Zuniga is a 73 y.o. male with history of coronary artery disease status post coronary bypass grafting x4 in 2011 with a LIMA to the LAD, jump graft to D1, saphenous vein graft OM1, RPL.  Also has a history of hypertension hyperlipidemia and diabetes.  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  Echo done in March 2019 revealed an EF of 45 to 50% with no pericardial effusion.  Had a functional study in 2019 showing inferior scar with no ischemia.  EKG showed sinus rhythm with nonspecific ST-T wave changes.  Chest x-ray revealed a small pleural effusion.  Patient states the pain has been there for the last several weeks.  It occurs when lifting heavy objects or extreme exertion.  Has not occurred at rest.  He has ruled out for myocardial infarction with serial high sensitivity troponins negative.  He currently is pain-free. He has been compliant with his medications. Past Medical History:  Diagnosis Date  . Arthritis   . COPD (chronic obstructive pulmonary disease) ([place])   . Diabetes ([place])   . Heart disease   . Hypertension   . Indigestion   . Thrombocytopenia [facility])       Surgical History:  Past Surgical History:  Procedure Laterality Date  . APPENDECTOMY    . CARDIAC SURGERY     bypass  . CHOLECYSTECTOMY    . COLONOSCOPY  12/23/2010     Home Meds: Prior to Admission medications   Medication Sig Start Date End Date Taking? Authorizing Provider  aspirin EC 81 MG tablet Take 81 mg by mouth daily.    Yes [provider]  carvedilol (COREG) 6.25 MG tablet Take 6.25 mg by mouth 2 (two) times daily  with a meal.   Yes [provider]  glipiZIDE (GLUCOTROL) 10 MG tablet Take 10 mg by mouth 2 (two) times daily.    Yes [provider]  insulin NPH-regular Human (NOVOLIN 70/30) (70-30) 100 UNIT/ML injection Inject 8 Units into the skin 2 (two) times daily.    Yes [provider]  losartan (COZAAR) 100 MG tablet Take 100 mg by mouth daily.   Yes [provider]  nitroGLYCERIN (NITROSTAT) 0.4 MG SL tablet Place 0.4 mg under the tongue every 5 (five) minutes as needed for chest pain.   Yes [provider]    Inpatient Medications:   . heparin 950 Units/hr (05/25/19 1522)    Allergies:  Allergies  Allergen Reactions  . Pioglitazone Swelling  . Atorvastatin Rash  . Benazepril Rash  . Latex Rash  . Tape Rash    Social History   Socioeconomic History  . Marital status: Married    Spouse name: Not on file  . Number of children: Not on file  . Years of education: Not on file  . Highest education level: Not on file  Occupational History  . Not on file  Social Needs  . Financial resource strain: Not on file  . Food insecurity    Worry: Not on file    Inability: Not on file  . Transportation needs    Medical: Not on file    Non-medical:  Not on file  Tobacco Use  . Smoking status: Never Smoker  . Smokeless tobacco: Never Used  Substance and Sexual Activity  . Alcohol use: No    Frequency: Never  . Drug use: No  . Sexual activity: Not on file  Lifestyle  . Physical activity    Days per week: Not on file    Minutes per session: Not on file  . Stress: Not on file  Relationships  . Social Herbalist on phone: Not on file    Gets together: Not on file    Attends religious service: Not on file    Active member of club or organization: Not on file    Attends meetings of clubs or organizations: Not on file    Relationship status: Not on file  . Intimate partner violence    Fear of current or ex partner: Not on file     Emotionally abused: Not on file    Physically abused: Not on file    Forced sexual activity: Not on file  Other Topics Concern  . Not on file  Social History Narrative  . Not on file     Family History  Problem Relation Age of Onset  . CAD Mother   . Kidney failure Father   . Diabetes Father      Review of Systems: A 12-system review of systems was performed and is negative except as noted in the HPI.  Labs: No results for input(s): CKTOTAL, CKMB, TROPONINI in the last 72 hours. Lab Results  Component Value Date   WBC 4.8 05/25/2019   HGB 11.4 (L) 05/25/2019   HCT 31.4 (L) 05/25/2019   MCV 89.2 05/25/2019   PLT 126 (L) 05/25/2019    Recent Labs  Lab 05/25/19 1150  NA 140  K 4.7  CL 107  CO2 26  BUN 23  CREATININE 1.26*  CALCIUM 8.9  GLUCOSE 217*   Lab Results  Component Value Date   CHOL 156 10/28/2017   HDL 60 10/28/2017   LDLCALC 88 10/28/2017   TRIG 39 10/28/2017   No results found for: DDIMER  Radiology/Studies:  Dg Chest 2 View  Result Date: 05/25/2019 CLINICAL DATA:  Chest pain EXAM: CHEST - 2 VIEW COMPARISON:  04/19/2019 FINDINGS: Cardiac shadow is within normal limits. Postsurgical changes are noted. Blunting of the left costophrenic angle is seen which is new from the prior CT examination consistent with small effusion. No focal confluent infiltrate is seen. No bony abnormality is noted. IMPRESSION: Small left pleural effusion. Electronically Signed   By: Inez Catalina M.D.   On: 05/25/2019 14:01   Ct Head Wo Contrast  Result Date: 05/25/2019 CLINICAL DATA:  Subacute neuro deficits. EXAM: CT HEAD WITHOUT CONTRAST TECHNIQUE: Contiguous axial images were obtained from the base of the skull through the vertex without intravenous contrast. COMPARISON:  10/27/2017 FINDINGS: Brain: There is no evidence for acute hemorrhage, hydrocephalus, mass lesion, or abnormal extra-axial fluid collection. No definite CT evidence for acute infarction. Vascular: No  hyperdense vessel or unexpected calcification. Skull: No evidence for fracture. No worrisome lytic or sclerotic lesion. Sinuses/Orbits: The visualized paranasal sinuses and mastoid air cells are clear. Visualized portions of the globes and intraorbital fat are unremarkable. Other: None. IMPRESSION: No acute intracranial abnormality. Electronically Signed   By: Misty Stanley M.D.   On: 05/25/2019 14:34   Ct Angio Chest/abd/pel For Dissection W And/or Wo Contrast  Result Date: 05/25/2019 CLINICAL DATA:  Aortic disease, chest  pain, right lower quadrant pain EXAM: CT ANGIOGRAPHY CHEST, ABDOMEN AND PELVIS TECHNIQUE: Multidetector CT imaging through the chest, abdomen and pelvis was performed using the standard protocol during bolus administration of intravenous contrast. Multiplanar reconstructed images and MIPs were obtained and reviewed to evaluate the vascular anatomy. CONTRAST:  166mL OMNIPAQUE IOHEXOL 350 MG/ML SOLN COMPARISON:  None. FINDINGS: CTA CHEST FINDINGS Cardiovascular: Preferential opacification of the thoracic aorta. No evidence of thoracic aortic aneurysm or dissection. Normal heart size. No pericardial effusion. Prior CABG. Extensive coronary artery atherosclerosis. Mild thoracic aortic atherosclerosis. Mediastinum/Nodes: No enlarged mediastinal, hilar, or axillary lymph nodes. Thyroid gland, trachea, and esophagus demonstrate no significant findings. Lungs/Pleura: Small left pleural effusion. Bibasilar mild atelectasis. No focal consolidation. Musculoskeletal: No chest wall abnormality. No acute or significant osseous findings. Review of the MIP images confirms the above findings. CTA ABDOMEN AND PELVIS FINDINGS VASCULAR Aorta: Normal caliber aorta without aneurysm, dissection, vasculitis or significant stenosis. Mild abdominal aortic atherosclerosis. Celiac: Patent without evidence of aneurysm, dissection, vasculitis or significant stenosis. SMA: Patent without evidence of aneurysm, dissection,  vasculitis or significant stenosis. Renals: Both renal arteries are patent without evidence of aneurysm, dissection, vasculitis, fibromuscular dysplasia or significant stenosis. IMA: Patent without evidence of aneurysm, dissection, vasculitis or significant stenosis. Inflow: Patent without evidence of aneurysm, dissection, vasculitis or significant stenosis. Veins: No obvious venous abnormality within the limitations of this arterial phase study. Review of the MIP images confirms the above findings. NON-VASCULAR Hepatobiliary: No focal liver abnormality is seen. Status post cholecystectomy. No biliary dilatation. Pancreas: Unremarkable. No pancreatic ductal dilatation or surrounding inflammatory changes. Spleen: Normal in size without focal abnormality. Adrenals/Urinary Tract: Adrenal glands are unremarkable. Kidneys are normal, without renal calculi, focal lesion, or hydronephrosis. Bladder is unremarkable. Stomach/Bowel: Stomach is within normal limits. No evidence of bowel wall thickening, distention, or inflammatory changes. Diverticulosis without evidence of diverticulitis. Moderate amount of stool throughout the colon. Lymphatic: No lymphadenopathy. Reproductive: Prostate is unremarkable. Other: No abdominal wall hernia or abnormality. No abdominopelvic ascites. Musculoskeletal: No acute osseous abnormality. No aggressive osseous lesion. Review of the MIP images confirms the above findings. IMPRESSION: 1. No aortic aneurysm or dissection. 2. No acute abnormality of the chest, abdomen or pelvis. 3. Small left pleural effusion.  Mild bibasilar atelectasis. 4. Diverticulosis without evidence of diverticulitis. 5. Moderate amount of stool throughout the colon. Electronically Signed   By: Kathreen Devoid   On: 05/25/2019 14:38    Wt Readings from Last 3 Encounters:  05/25/19 78.9 kg  08/20/18 81.6 kg  10/27/17 76.9 kg    EKG: Sinus rhythm with nonspecific ST-T wave changes  Physical Exam:  Blood pressure  (!) 189/93, pulse 78, temperature 98.2 F (36.8 C), temperature source Oral, resp. rate 18, height 6' (1.829 m), weight 78.9 kg, SpO2 100 %. Body mass index is 23.6 kg/m. General: Well developed, well nourished, in no acute distress. Head: Normocephalic, atraumatic, sclera non-icteric, no xanthomas, nares are without discharge.  Neck: Negative for carotid bruits. JVD not elevated. Lungs: Clear bilaterally to auscultation without wheezes, rales, or rhonchi. Breathing is unlabored. Heart: RRR with S1 S2. No murmurs, rubs, or gallops appreciated. Abdomen: Soft, non-tender, non-distended with normoactive bowel sounds. No hepatomegaly. No rebound/guarding. No obvious abdominal masses. Msk:  Strength and tone appear normal for age. Extremities: No clubbing or cyanosis. No edema.  Distal pedal pulses are 2+ and equal bilaterally. Neuro: Alert and oriented X 3. No facial asymmetry. No focal deficit. Moves all extremities spontaneously. Psych:  Responds to questions appropriately  with a normal affect.     Assessment and Plan  73 year old male with history of coronary disease status post coronary artery bypass x4 in 2011 with a LIMA to the LAD, jump graft to D1, saphenous vein graft OM1, RPL.  He was admitted with chest pain with both typical atypical features.  Electrocardiogram was unremarkable.  Is ruled out for myocardial infarction with serial high-sensitivity troponins negative.  Functional study done in February of this year showed moderate inferior scar without significant ischemia.  Ejection fraction was 38%.  Echo EF in 2019 was 45 to 50%.  Patient is pain-free at present.  We will determine whether altering medical management versus repeat functional study versus invasive evaluation.  Will discuss with the patient's primary cardiologist.  We will keep n.p.o. for now.  Will discontinue heparin.   Ivin Booty MD 05/25/2019, 6:08 PM Pager: [phone]

## 2019-05-25 NOTE — H&P (Addendum)
[facility] NAME: Rick Zuniga    MR#:  [id number]  DATE OF BIRTH:  Jun 06, 1946  DATE OF ADMISSION:  05/25/2019  PRIMARY CARE PHYSICIAN: [name], MD   REQUESTING/REFERRING PHYSICIAN: Dr. Joni Fears.  CHIEF COMPLAINT:   Chief Complaint  Patient presents with   Chest Pain   Chest pain. HISTORY OF PRESENT ILLNESS:  Rick Zuniga  is a 73 y.o. male with a known history of hypertension, diabetes, heart disease, COPD and thrombocytopenia.  The patient presents to the ED with chest pain for several days.  Chest pain is in the whole chest, sharp and intermittent without radiation, worse with exertion.  He also complains of nausea and diaphoresis.  He denies any orthopnea or leg swelling.  The patient also has right upper quadrant pain and is following GI for colonoscopy. The first 2 troponins are unremarkable.  CT angiogram of the chest did not show any PE.  Dr. Joni Fears start heparin drip and request admission. PAST MEDICAL HISTORY:   Past Medical History:  Diagnosis Date   Arthritis    COPD (chronic obstructive pulmonary disease) ([place])    Diabetes ([place])    Heart disease    Hypertension    Indigestion    Thrombocytopenia ([place])     PAST SURGICAL HISTORY:   Past Surgical History:  Procedure Laterality Date   APPENDECTOMY     CARDIAC SURGERY     bypass   CHOLECYSTECTOMY     COLONOSCOPY  12/23/2010    SOCIAL HISTORY:   Social History   Tobacco Use   Smoking status: Never Smoker   Smokeless tobacco: Never Used  Substance Use Topics   Alcohol use: No    Frequency: Never    FAMILY HISTORY:   Family History  Problem Relation Age of Onset   CAD Mother    Kidney failure Father    Diabetes Father     DRUG ALLERGIES:   Allergies  Allergen Reactions   Pioglitazone Swelling   Atorvastatin Rash   Benazepril Rash   Latex Rash   Tape Rash    REVIEW OF SYSTEMS:   Review of  Systems  Constitutional: Negative for chills, fever and malaise/fatigue.  HENT: Negative for sore throat.   Eyes: Negative for blurred vision and double vision.  Respiratory: Positive for shortness of breath. Negative for cough, hemoptysis, sputum production, wheezing and stridor.   Cardiovascular: Positive for chest pain. Negative for palpitations, orthopnea and leg swelling.  Gastrointestinal: Positive for abdominal pain. Negative for blood in stool, diarrhea, melena, nausea and vomiting.  Genitourinary: Negative for dysuria, flank pain and hematuria.  Musculoskeletal: Negative for back pain and joint pain.  Neurological: Negative for dizziness, sensory change, focal weakness, seizures, loss of consciousness, weakness and headaches.  Endo/Heme/Allergies: Negative for polydipsia.  Psychiatric/Behavioral: Negative for depression. The patient is not nervous/anxious.     MEDICATIONS AT HOME:   Prior to Admission medications   Medication Sig Start Date End Date Taking? Authorizing Provider  amLODipine (NORVASC) 10 MG tablet Take 1 tablet (10 mg total) by mouth daily. 10/28/17 11/27/17  Hillary Bow, MD  aspirin EC 81 MG tablet Take 1 tablet by mouth daily.    [provider]  carvedilol (COREG) 6.25 MG tablet Take 6.25 mg by mouth 2 (two) times daily with a meal.    [provider]  gabapentin (NEURONTIN) 100 MG capsule Take 100 mg by mouth. Nightly for 7 days  [provider]  glipiZIDE (GLUCOTROL) 10 MG tablet Take 1 tablet by mouth 2 (two) times daily. 01/04/16   [provider]  insulin NPH-regular Human (NOVOLIN 70/30) (70-30) 100 UNIT/ML injection Inject 15-20 Units into the skin at bedtime.     [provider]  losartan (COZAAR) 100 MG tablet Take 100 mg by mouth daily.    [provider]  metFORMIN (GLUCOPHAGE) 500 MG tablet Take 500 mg by mouth 2 (two) times daily with a meal.    [provider]  nitroGLYCERIN (NITROSTAT)  0.4 MG SL tablet Place 0.4 mg under the tongue every 5 (five) minutes as needed for chest pain.    [provider]      VITAL SIGNS:  Blood pressure (!) 168/81, pulse 64, temperature 98.2 F (36.8 C), temperature source Oral, resp. rate 16, height 6' (1.829 m), weight 78.9 kg, SpO2 95 %.  PHYSICAL EXAMINATION:  Physical Exam  GENERAL:  73 y.o.-year-old patient lying in the bed with no acute distress.  EYES: Pupils equal, round, reactive to light and accommodation. No scleral icterus. Extraocular muscles intact.  HEENT: Head atraumatic, normocephalic. NECK:  Supple, no jugular venous distention. No thyroid enlargement, no tenderness.  LUNGS: Normal breath sounds bilaterally, no wheezing, rales,rhonchi or crepitation. No use of accessory muscles of respiration.  CARDIOVASCULAR: S1, S2 normal. No murmurs, rubs, or gallops.  ABDOMEN: Soft, tenderness on the right side, nondistended. Bowel sounds present. No organomegaly or mass.  EXTREMITIES: No pedal edema, cyanosis, or clubbing.  NEUROLOGIC: Cranial nerves II through XII are intact. Muscle strength 5/5 in all extremities. Sensation intact. Gait not checked.  PSYCHIATRIC: The patient is alert and oriented x 3.  SKIN: No obvious rash, lesion, or ulcer.   LABORATORY PANEL:   CBC Recent Labs  Lab 05/25/19 1150  WBC 4.8  HGB 11.4*  HCT 31.4*  PLT 126*   ------------------------------------------------------------------------------------------------------------------  Chemistries  Recent Labs  Lab 05/25/19 1150  NA 140  K 4.7  CL 107  CO2 26  GLUCOSE 217*  BUN 23  CREATININE 1.26*  CALCIUM 8.9   ------------------------------------------------------------------------------------------------------------------  Cardiac Enzymes No results for input(s): TROPONINI in the last 168 hours. ------------------------------------------------------------------------------------------------------------------  RADIOLOGY:  Dg  Chest 2 View  Result Date: 05/25/2019 CLINICAL DATA:  Chest pain EXAM: CHEST - 2 VIEW COMPARISON:  04/19/2019 FINDINGS: Cardiac shadow is within normal limits. Postsurgical changes are noted. Blunting of the left costophrenic angle is seen which is new from the prior CT examination consistent with small effusion. No focal confluent infiltrate is seen. No bony abnormality is noted. IMPRESSION: Small left pleural effusion. Electronically Signed   By: Inez Catalina M.D.   On: 05/25/2019 14:01   Ct Head Wo Contrast  Result Date: 05/25/2019 CLINICAL DATA:  Subacute neuro deficits. EXAM: CT HEAD WITHOUT CONTRAST TECHNIQUE: Contiguous axial images were obtained from the base of the skull through the vertex without intravenous contrast. COMPARISON:  10/27/2017 FINDINGS: Brain: There is no evidence for acute hemorrhage, hydrocephalus, mass lesion, or abnormal extra-axial fluid collection. No definite CT evidence for acute infarction. Vascular: No hyperdense vessel or unexpected calcification. Skull: No evidence for fracture. No worrisome lytic or sclerotic lesion. Sinuses/Orbits: The visualized paranasal sinuses and mastoid air cells are clear. Visualized portions of the globes and intraorbital fat are unremarkable. Other: None. IMPRESSION: No acute intracranial abnormality. Electronically Signed   By: Misty Stanley M.D.   On: 05/25/2019 14:34   Ct Angio Chest/abd/pel For Dissection W And/or Wo Contrast  Result Date: 05/25/2019 CLINICAL DATA:  Aortic disease, chest pain, right lower quadrant pain EXAM: CT ANGIOGRAPHY CHEST, ABDOMEN AND PELVIS TECHNIQUE: Multidetector CT imaging through the chest, abdomen and pelvis was performed using the standard protocol during bolus administration of intravenous contrast. Multiplanar reconstructed images and MIPs were obtained and reviewed to evaluate the vascular anatomy. CONTRAST:  144mL OMNIPAQUE IOHEXOL 350 MG/ML SOLN COMPARISON:  None. FINDINGS: CTA CHEST FINDINGS  Cardiovascular: Preferential opacification of the thoracic aorta. No evidence of thoracic aortic aneurysm or dissection. Normal heart size. No pericardial effusion. Prior CABG. Extensive coronary artery atherosclerosis. Mild thoracic aortic atherosclerosis. Mediastinum/Nodes: No enlarged mediastinal, hilar, or axillary lymph nodes. Thyroid gland, trachea, and esophagus demonstrate no significant findings. Lungs/Pleura: Small left pleural effusion. Bibasilar mild atelectasis. No focal consolidation. Musculoskeletal: No chest wall abnormality. No acute or significant osseous findings. Review of the MIP images confirms the above findings. CTA ABDOMEN AND PELVIS FINDINGS VASCULAR Aorta: Normal caliber aorta without aneurysm, dissection, vasculitis or significant stenosis. Mild abdominal aortic atherosclerosis. Celiac: Patent without evidence of aneurysm, dissection, vasculitis or significant stenosis. SMA: Patent without evidence of aneurysm, dissection, vasculitis or significant stenosis. Renals: Both renal arteries are patent without evidence of aneurysm, dissection, vasculitis, fibromuscular dysplasia or significant stenosis. IMA: Patent without evidence of aneurysm, dissection, vasculitis or significant stenosis. Inflow: Patent without evidence of aneurysm, dissection, vasculitis or significant stenosis. Veins: No obvious venous abnormality within the limitations of this arterial phase study. Review of the MIP images confirms the above findings. NON-VASCULAR Hepatobiliary: No focal liver abnormality is seen. Status post cholecystectomy. No biliary dilatation. Pancreas: Unremarkable. No pancreatic ductal dilatation or surrounding inflammatory changes. Spleen: Normal in size without focal abnormality. Adrenals/Urinary Tract: Adrenal glands are unremarkable. Kidneys are normal, without renal calculi, focal lesion, or hydronephrosis. Bladder is unremarkable. Stomach/Bowel: Stomach is within normal limits. No evidence of  bowel wall thickening, distention, or inflammatory changes. Diverticulosis without evidence of diverticulitis. Moderate amount of stool throughout the colon. Lymphatic: No lymphadenopathy. Reproductive: Prostate is unremarkable. Other: No abdominal wall hernia or abnormality. No abdominopelvic ascites. Musculoskeletal: No acute osseous abnormality. No aggressive osseous lesion. Review of the MIP images confirms the above findings. IMPRESSION: 1. No aortic aneurysm or dissection. 2. No acute abnormality of the chest, abdomen or pelvis. 3. Small left pleural effusion.  Mild bibasilar atelectasis. 4. Diverticulosis without evidence of diverticulitis. 5. Moderate amount of stool throughout the colon. Electronically Signed   By: Kathreen Devoid   On: 05/25/2019 14:38      IMPRESSION AND PLAN:   Chest pain, rule out ACS. The patient will be placed on observation.  First 2 troponins are 21 and 20. Follow-up the third troponin level, continue heparin drip and aspirin.  Patient is allergic to Lipitor.  Cardiology consult.  Hypertension, accelerated, continue Coreg and Norvasc.  IV pressor PRN. Diabetes.  Start sliding scale. COPD.  Stable. Thrombocytopenia.  Stable. All the records are reviewed and case discussed with ED provider. Management plans discussed with the patient, his wife and they are in agreement.  CODE STATUS: Full code.  TOTAL TIME TAKING CARE OF THIS PATIENT: 47 minutes.    Demetrios Loll M.D on 05/25/2019 at 3:40 PM  Between 7am to 6pm - Pager - [phone]  After 6pm go to [email or web address] - Proofreader  [facility]  Office  [phone]  CC: Primary care physician; [name], MD   Note: This dictation was prepared with Dragon dictation along with smaller phrase technology. Any transcriptional errors  that result from this process are unin

## 2019-05-25 NOTE — ED Notes (Signed)
This RN has attempted to call report, unable to. Nursing supervisor has been notified and instructed this RN to call again in 20 minutes.

## 2019-05-25 NOTE — ED Notes (Signed)
PT and family updated that bed is no longer available upstairs.  PT and family also educated on healthy food Choices.

## 2019-05-25 NOTE — ED Notes (Signed)
ED TO INPATIENT HANDOFF REPORT  ED Nurse Name and Phone #: Alvaretta Eisenberger [phone]  S Name/Age/Gender Rick Zuniga 73 y.o. male Room/Bed: ED08A/ED08A  Code Status   Code Status: Prior  Home/SNF/Other Home Patient oriented to: self, place, time and situation Is this baseline? Yes   Triage Complete: Triage complete  Chief Complaint chest pain syncopal episodes  Triage Note Pt in via POV, reports sharp chest pain w/ associated shortness of breath, dizziness, nausea.  Reports pain woke him up from a sleep.  NAD noted at this time.    Allergies Allergies  Allergen Reactions  . Pioglitazone Swelling  . Atorvastatin Rash  . Benazepril Rash  . Latex Rash  . Tape Rash    Level of Care/Admitting Diagnosis ED Disposition    ED Disposition Condition [facility] Area: [place] [100120]  Level of Care: Telemetry [5]  Covid Evaluation: Asymptomatic Screening Protocol (No Symptoms)  Diagnosis: Chest pain [id number]  Admitting Physician: Demetrios Loll [id number]  Attending Physician: Demetrios Loll [phone]  PT Class (Do Not Modify): Observation [104]  PT Acc Code (Do Not Modify): Observation [10022]       B Medical/Surgery History Past Medical History:  Diagnosis Date  . Arthritis   . COPD (chronic obstructive pulmonary disease) ([place])   . Diabetes ([place])   . Heart disease   . Hypertension   . Indigestion   . Thrombocytopenia ([place])    Past Surgical History:  Procedure Laterality Date  . APPENDECTOMY    . CARDIAC SURGERY     bypass  . CHOLECYSTECTOMY    . COLONOSCOPY  12/23/2010     A IV Location/Drains/Wounds Patient Lines/Drains/Airways Status   Active Line/Drains/Airways    Name:   Placement date:   Placement time:   Site:   Days:   Peripheral IV 05/25/19 Right Antecubital   05/25/19    1308    Antecubital   less than 1   Peripheral IV 05/25/19 Right Forearm   05/25/19    1543    Forearm   less than 1          Intake/Output Last  24 hours No intake or output data in the 24 hours ending 05/25/19 1722  Labs/Imaging Results for orders placed or performed during the hospital encounter of 05/25/19 (from the past 48 hour(s))  Basic metabolic panel     Status: Abnormal   Collection Time: 05/25/19 11:50 AM  Result Value Ref Range   Sodium 140 135 - 145 mmol/L   Potassium 4.7 3.5 - 5.1 mmol/L   Chloride 107 98 - 111 mmol/L   CO2 26 22 - 32 mmol/L   Glucose, Bld 217 (H) 70 - 99 mg/dL   BUN 23 8 - 23 mg/dL   Creatinine, Ser 1.26 (H) 0.61 - 1.24 mg/dL   Calcium 8.9 8.9 - 10.3 mg/dL   GFR calc non Af Amer 56 (L) >60 mL/min   GFR calc Af Amer >60 >60 mL/min   Anion gap 7 5 - 15    Comment: Performed at [facility], [address] [place], [place] [address]  CBC     Status: Abnormal   Collection Time: 05/25/19 11:50 AM  Result Value Ref Range   WBC 4.8 4.0 - 10.5 K/uL   RBC 3.52 (L) 4.22 - 5.81 MIL/uL   Hemoglobin 11.4 (L) 13.0 - 17.0 g/dL   HCT 31.4 (L) 39.0 - 52.0 %   MCV 89.2 80.0 - 100.0  fL   MCH 32.4 26.0 - 34.0 pg   MCHC 36.3 (H) 30.0 - 36.0 g/dL   RDW 13.2 11.5 - 15.5 %   Platelets 126 (L) 150 - 400 K/uL    Comment: Immature Platelet Fraction may be clinically indicated, consider ordering this additional test [id number]    nRBC 0.0 0.0 - 0.2 %    Comment: Performed at [facility], [place], [place] [address]  Troponin I (High Sensitivity)     Status: Abnormal   Collection Time: 05/25/19 11:50 AM  Result Value Ref Range   Troponin I (High Sensitivity) 21 (H) <18 ng/L    Comment: (NOTE) Elevated high sensitivity troponin I (hsTnI) values and significant  changes across serial measurements may suggest ACS but many other  chronic and acute conditions are known to elevate hsTnI results.  Refer to the "Links" section for chest pain algorithms and additional  guidance. Performed at [facility], [place], [place] [address]   Troponin I (High  Sensitivity)     Status: Abnormal   Collection Time: 05/25/19  1:58 PM  Result Value Ref Range   Troponin I (High Sensitivity) 20 (H) <18 ng/L    Comment: (NOTE) Elevated high sensitivity troponin I (hsTnI) values and significant  changes across serial measurements may suggest ACS but many other  chronic and acute conditions are known to elevate hsTnI results.  Refer to the "Links" section for chest pain algorithms and additional  guidance. Performed at [facility], [address] [place], [place] [address]   APTT     Status: None   Collection Time: 05/25/19  3:21 PM  Result Value Ref Range   aPTT 29 24 - 36 seconds    Comment: Performed at [facility], [address] [place], [place] [address]  Protime-INR     Status: None   Collection Time: 05/25/19  3:21 PM  Result Value Ref Range   Prothrombin Time 13.6 11.4 - 15.2 seconds   INR 1.1 0.8 - 1.2    Comment: (NOTE) INR goal varies based on device and disease states. Performed at [facility], [address] [place], [place] [address]    Dg Chest 2 View  Result Date: 05/25/2019 CLINICAL DATA:  Chest pain EXAM: CHEST - 2 VIEW COMPARISON:  04/19/2019 FINDINGS: Cardiac shadow is within normal limits. Postsurgical changes are noted. Blunting of the left costophrenic angle is seen which is new from the prior CT examination consistent with small effusion. No focal confluent infiltrate is seen. No bony abnormality is noted. IMPRESSION: Small left pleural effusion. Electronically Signed   By: Inez Catalina M.D.   On: 05/25/2019 14:01   Ct Head Wo Contrast  Result Date: 05/25/2019 CLINICAL DATA:  Subacute neuro deficits. EXAM: CT HEAD WITHOUT CONTRAST TECHNIQUE: Contiguous axial images were obtained from the base of the skull through the vertex without intravenous contrast. COMPARISON:  10/27/2017 FINDINGS: Brain: There is no evidence for acute hemorrhage, hydrocephalus, mass lesion, or abnormal extra-axial  fluid collection. No definite CT evidence for acute infarction. Vascular: No hyperdense vessel or unexpected calcification. Skull: No evidence for fracture. No worrisome lytic or sclerotic lesion. Sinuses/Orbits: The visualized paranasal sinuses and mastoid air cells are clear. Visualized portions of the globes and intraorbital fat are unremarkable. Other: None. IMPRESSION: No acute intracranial abnormality. Electronically Signed   By: Misty Stanley M.D.   On: 05/25/2019 14:34   Ct Angio Chest/abd/pel For Dissection W And/or  Wo Contrast  Result Date: 05/25/2019 CLINICAL DATA:  Aortic disease, chest pain, right lower quadrant pain EXAM: CT ANGIOGRAPHY CHEST, ABDOMEN AND PELVIS TECHNIQUE: Multidetector CT imaging through the chest, abdomen and pelvis was performed using the standard protocol during bolus administration of intravenous contrast. Multiplanar reconstructed images and MIPs were obtained and reviewed to evaluate the vascular anatomy. CONTRAST:  154mL OMNIPAQUE IOHEXOL 350 MG/ML SOLN COMPARISON:  None. FINDINGS: CTA CHEST FINDINGS Cardiovascular: Preferential opacification of the thoracic aorta. No evidence of thoracic aortic aneurysm or dissection. Normal heart size. No pericardial effusion. Prior CABG. Extensive coronary artery atherosclerosis. Mild thoracic aortic atherosclerosis. Mediastinum/Nodes: No enlarged mediastinal, hilar, or axillary lymph nodes. Thyroid gland, trachea, and esophagus demonstrate no significant findings. Lungs/Pleura: Small left pleural effusion. Bibasilar mild atelectasis. No focal consolidation. Musculoskeletal: No chest wall abnormality. No acute or significant osseous findings. Review of the MIP images confirms the above findings. CTA ABDOMEN AND PELVIS FINDINGS VASCULAR Aorta: Normal caliber aorta without aneurysm, dissection, vasculitis or significant stenosis. Mild abdominal aortic atherosclerosis. Celiac: Patent without evidence of aneurysm, dissection, vasculitis or  significant stenosis. SMA: Patent without evidence of aneurysm, dissection, vasculitis or significant stenosis. Renals: Both renal arteries are patent without evidence of aneurysm, dissection, vasculitis, fibromuscular dysplasia or significant stenosis. IMA: Patent without evidence of aneurysm, dissection, vasculitis or significant stenosis. Inflow: Patent without evidence of aneurysm, dissection, vasculitis or significant stenosis. Veins: No obvious venous abnormality within the limitations of this arterial phase study. Review of the MIP images confirms the above findings. NON-VASCULAR Hepatobiliary: No focal liver abnormality is seen. Status post cholecystectomy. No biliary dilatation. Pancreas: Unremarkable. No pancreatic ductal dilatation or surrounding inflammatory changes. Spleen: Normal in size without focal abnormality. Adrenals/Urinary Tract: Adrenal glands are unremarkable. Kidneys are normal, without renal calculi, focal lesion, or hydronephrosis. Bladder is unremarkable. Stomach/Bowel: Stomach is within normal limits. No evidence of bowel wall thickening, distention, or inflammatory changes. Diverticulosis without evidence of diverticulitis. Moderate amount of stool throughout the colon. Lymphatic: No lymphadenopathy. Reproductive: Prostate is unremarkable. Other: No abdominal wall hernia or abnormality. No abdominopelvic ascites. Musculoskeletal: No acute osseous abnormality. No aggressive osseous lesion. Review of the MIP images confirms the above findings. IMPRESSION: 1. No aortic aneurysm or dissection. 2. No acute abnormality of the chest, abdomen or pelvis. 3. Small left pleural effusion.  Mild bibasilar atelectasis. 4. Diverticulosis without evidence of diverticulitis. 5. Moderate amount of stool throughout the colon. Electronically Signed   By: Kathreen Devoid   On: 05/25/2019 14:38    Pending Labs Unresulted Labs (From admission, onward)    Start     Ordered   05/26/19 0000  Heparin level  (unfractionated)  Once-Timed,   STAT     05/25/19 1531   05/26/19 0000  CBC  Once-Timed,   STAT     05/25/19 1531   05/25/19 1336  SARS CORONAVIRUS 2 (TAT 6-24 HRS) Nasopharyngeal Nasopharyngeal Swab  (Asymptomatic/Tier 2 Patients Labs)  Once,   STAT    Question Answer Comment  Is this test for diagnosis or screening Screening   Symptomatic for COVID-19 as defined by CDC No   Hospitalized for COVID-19 No   Admitted to ICU for COVID-19 No   Previously tested for COVID-19 Yes   Resident in a congregate (group) care setting No   Employed in healthcare setting No      05/25/19 1335   Signed and Held  Hemoglobin A1c  Once,   R    Comments: To assess prior glycemic control  Signed and Held   Signed and Held  Lipid panel  Tomorrow morning,   R     Signed and Held          Vitals/Pain Today's Vitals   05/25/19 1514 05/25/19 1515 05/25/19 1530 05/25/19 1600  BP: (!) 168/81  (!) 180/89 (!) 184/85  Pulse:  64  63  Resp:  16 15 16   Temp:      TempSrc:      SpO2:  95%  99%  Weight:      Height:      PainSc:        Isolation Precautions No active isolations  Medications Medications  nitroGLYCERIN (NITROSTAT) SL tablet 0.4 mg (0.4 mg Sublingual Given 05/25/19 1351)  heparin bolus via infusion 4,000 Units (4,000 Units Intravenous Bolus from Bag 05/25/19 1522)    Followed by  heparin ADULT infusion 100 units/mL (25000 units/251mL sodium chloride 0.45%) (950 Units/hr Intravenous New Bag/Given 05/25/19 1522)  aspirin chewable tablet 324 mg (324 mg Oral Given 05/25/19 1348)  acetaminophen (TYLENOL) tablet 1,000 mg (1,000 mg Oral Given 05/25/19 1354)  ondansetron (ZOFRAN) injection 4 mg (4 mg Intravenous Given 05/25/19 1352)  iohexol (OMNIPAQUE) 350 MG/ML injection 100 mL (100 mLs Intravenous Contrast Given 05/25/19 1402)    Mobility walks Low fall risk   Focused Assessments Cardiac Assessment Handoff:    No results found for: CKTOTAL, CKMB, CKMBINDEX, TROPONINI No results found  for: DDIMER Does the Patient currently have chest pain? No      R Recommendations: See Admitting Provider Note  Report given to:   Additional Notes:

## 2019-05-25 NOTE — Consult Note (Signed)
ANTICOAGULATION CONSULT NOTE - Initial Consult  Pharmacy Consult for Heparin Infusion  Indication: chest pain/ACS  Allergies  Allergen Reactions  . Pioglitazone Swelling  . Atorvastatin Rash  . Benazepril Rash  . Latex Rash  . Tape Rash   Patient Measurements: Height: 6' (182.9 cm) Weight: 174 lb (78.9 kg) IBW/kg (Calculated) : 77.6  Vital Signs: Temp: 98.2 F (36.8 C) (10/07 1151) Temp Source: Oral (10/07 1151) BP: 182/86 (10/07 1355) Pulse Rate: 66 (10/07 1355)  Labs: Recent Labs    05/25/19 1150 05/25/19 1358  HGB 11.4*  --   HCT 31.4*  --   PLT 126*  --   CREATININE 1.26*  --   TROPONINIHS 21* 20*    Estimated Creatinine Clearance: 56.5 mL/min (A) (by C-G formula based on SCr of 1.26 mg/dL (H)).   Medical History: Past Medical History:  Diagnosis Date  . Arthritis   . COPD (chronic obstructive pulmonary disease) ([place])   . Diabetes ([place])   . Heart disease   . Hypertension   . Indigestion   . Thrombocytopenia [facility])     Assessment: Pharmacy consulted for heparin infusion dosing and monitoring for ACS/STEMI.  No reported anticoagulates PTA. Patient has PMH of thrombocytopenia. Plts 126 on admission. Hgb: 11.4   Hct: 31.4   CrCl: 56.5  Troponin I (HS): 21>> 20  Goal of Therapy:  Heparin level 0.3-0.7 units/ml Monitor platelets by anticoagulation protocol: Yes   Plan:  Give 4000 units bolus x 1 Start heparin infusion at 950 units/hr Check anti-Xa level in 8 hours and daily while on heparin Continue to monitor H&H and platelets.   Pernell Dupre, PharmD, BCPS Clinical Pharmacist 05/25/2019 3:02 PM

## 2019-05-25 NOTE — ED Triage Notes (Signed)
Pt in via POV, reports sharp chest pain w/ associated shortness of breath, dizziness, nausea.  Reports pain woke him up from a sleep.  NAD noted at this time.

## 2019-05-25 NOTE — ED Notes (Signed)
Pt c/o LRQ pain and chest pain. States has appointment in 1wk with doctor who completed his heart surgery 10 years ago. States came in today bc the pain was getting worse. Edema 1+ in L ankle and 2+ in R ankle. Per wife, pt becomes SOB at times when CP is worst. Pt has had diarrhea for 2 months. Pt had fever a few nights ago and has been nauseous.

## 2019-05-25 NOTE — Progress Notes (Signed)
Advanced Care Plan.  Purpose of Encounter: CODE STATUS. Parties in Attendance: The patient, his wife and me. Patient's Decisional Capacity: Yes. Medical Story: Rick Zuniga  is a 74 y.o. male with a known history of hypertension, diabetes, heart disease, COPD and thrombocytopenia.    The patient is being admitted for chest pain rule out ACS.  He is on heparin drip.  I discussed with the patient about his current condition, prognosis and CODE STATUS.  The patient wants to be resuscitated and intubated if he has cardiopulmonary arrest. Plan:  Code Status: Full code.   Time spent discussing advance care planning: 17 minutes.

## 2019-05-26 ENCOUNTER — Observation Stay: Payer: Medicare Other

## 2019-05-26 ENCOUNTER — Ambulatory Visit: Admission: RE | Admit: 2019-05-26 | Payer: Medicare Other | Source: Home / Self Care | Admitting: Internal Medicine

## 2019-05-26 ENCOUNTER — Encounter: Admission: RE | Payer: Self-pay | Source: Home / Self Care

## 2019-05-26 DIAGNOSIS — R079 Chest pain, unspecified: Secondary | ICD-10-CM | POA: Diagnosis not present

## 2019-05-26 LAB — LIPID PANEL
Cholesterol: 149 mg/dL (ref 0–200)
HDL: 38 mg/dL — ABNORMAL LOW (ref >40)
LDL Cholesterol: 55 mg/dL (ref 0–99)
Total CHOL/HDL Ratio: 3.9 RATIO
Triglycerides: 282 mg/dL — ABNORMAL HIGH (ref <150)
VLDL: 56 mg/dL — ABNORMAL HIGH (ref 0–40)

## 2019-05-26 LAB — CBC
HCT: 29.9 % — ABNORMAL LOW (ref 39.0–52.0)
Hemoglobin: 10.6 g/dL — ABNORMAL LOW (ref 13.0–17.0)
MCH: 32.2 pg (ref 26.0–34.0)
MCHC: 35.5 g/dL (ref 30.0–36.0)
MCV: 90.9 fL (ref 80.0–100.0)
Platelets: 108 10*3/uL — ABNORMAL LOW (ref 150–400)
RBC: 3.29 MIL/uL — ABNORMAL LOW (ref 4.22–5.81)
RDW: 13.1 % (ref 11.5–15.5)
WBC: 5.1 10*3/uL (ref 4.0–10.5)
nRBC: 0 % (ref 0.0–0.2)

## 2019-05-26 LAB — NM MYOCAR MULTI W/SPECT W/WALL MOTION / EF
Estimated workload: 1 METS
Exercise duration (min): 1 min
Exercise duration (sec): 16 s
LV dias vol: 145 mL (ref 62–150)
LV sys vol: 80 mL
Peak HR: 85 {beats}/min
Percent HR: 58 %
Rest HR: 68 {beats}/min
SDS: 11
SRS: 3
SSS: 6
TID: 1.02

## 2019-05-26 LAB — GLUCOSE, CAPILLARY
Glucose-Capillary: 271 mg/dL — ABNORMAL HIGH (ref 70–99)
Glucose-Capillary: 183 mg/dL — ABNORMAL HIGH (ref 70–99)

## 2019-05-26 LAB — TROPONIN I (HIGH SENSITIVITY): Troponin I (High Sensitivity): 18 ng/L — ABNORMAL HIGH (ref <18)

## 2019-05-26 LAB — HEPARIN LEVEL (UNFRACTIONATED): Heparin Unfractionated: 0.42 IU/mL (ref 0.30–0.70)

## 2019-05-26 LAB — HEMOGLOBIN A1C
Hgb A1c MFr Bld: 10.3 % — ABNORMAL HIGH (ref 4.8–5.6)
Mean Plasma Glucose: 248.91 mg/dL

## 2019-05-26 LAB — SARS CORONAVIRUS 2 (TAT 6-24 HRS): SARS Coronavirus 2: NEGATIVE

## 2019-05-26 SURGERY — ESOPHAGOGASTRODUODENOSCOPY (EGD) WITH PROPOFOL
Anesthesia: General

## 2019-05-26 MED ORDER — INSULIN ASPART PROT & ASPART (70-30 MIX) 100 UNIT/ML ~~LOC~~ SUSP: 15.0000 [IU] | Freq: Two times a day (BID) | SUBCUTANEOUS | Status: DC

## 2019-05-26 MED ORDER — TECHNETIUM TC 99M TETROFOSMIN IV KIT
30.0000 | PACK | Freq: Once | INTRAVENOUS | Status: AC | PRN
  Administered 2019-05-26: 32.487 via INTRAVENOUS

## 2019-05-26 MED ORDER — INFLUENZA VAC A&B SA ADJ QUAD 0.5 ML IM PRSY: 0.5000 mL | PREFILLED_SYRINGE | INTRAMUSCULAR | Status: DC

## 2019-05-26 MED ORDER — TECHNETIUM TC 99M TETROFOSMIN IV KIT
10.7300 | PACK | Freq: Once | INTRAVENOUS | Status: AC | PRN
  Administered 2019-05-26: 09:00:00 10.73 via INTRAVENOUS

## 2019-05-26 MED ORDER — INFLUENZA VAC A&B SA ADJ QUAD 0.5 ML IM PRSY
0.5000 mL | PREFILLED_SYRINGE | INTRAMUSCULAR | Status: AC
  Administered 2019-05-26: 17:00:00 0.5 mL via INTRAMUSCULAR
  Filled 2019-05-26: qty 0.5

## 2019-05-26 MED ORDER — REGADENOSON 0.4 MG/5ML IV SOLN
0.4000 mg | Freq: Once | INTRAVENOUS | Status: AC
  Administered 2019-05-26: 10:00:00 0.4 mg via INTRAVENOUS

## 2019-05-26 MED ORDER — AMLODIPINE BESYLATE 10 MG PO TABS: 10.0000 mg | ORAL_TABLET | Freq: Every day | ORAL | 0 refills | Status: DC

## 2019-05-26 NOTE — Consult Note (Signed)
ANTICOAGULATION CONSULT NOTE - Initial Consult  Pharmacy Consult for Heparin Infusion  Indication: chest pain/ACS  Allergies  Allergen Reactions  . Pioglitazone Swelling  . Atorvastatin Rash  . Benazepril Rash  . Latex Rash  . Tape Rash   Patient Measurements: Height: 6\' 1"  (185.4 cm) Weight: 169 lb 9.6 oz (76.9 kg) IBW/kg (Calculated) : 79.9  Vital Signs: Temp: 98.6 F (37 C) (10/07 2121) BP: 173/87 (10/07 2121) Pulse Rate: 79 (10/07 2121)  Labs: Recent Labs    05/25/19 1150 05/25/19 1358 05/25/19 1521 05/25/19 2147 05/26/19 0008  HGB 11.4*  --   --   --  10.6*  HCT 31.4*  --   --   --  29.9*  PLT 126*  --   --   --  108*  APTT  --   --  29  --   --   LABPROT  --   --  13.6  --   --   INR  --   --  1.1  --   --   HEPARINUNFRC  --   --   --   --  0.42  CREATININE 1.26*  --   --   --   --   TROPONINIHS 21* 20*  --  18* 18*    Estimated Creatinine Clearance: 55.9 mL/min (A) (by C-G formula based on SCr of 1.26 mg/dL (H)).   Medical History: Past Medical History:  Diagnosis Date  . Arthritis   . COPD (chronic obstructive pulmonary disease) ([place])   . Diabetes ([place])   . Heart disease   . Hypertension   . Indigestion   . Thrombocytopenia [facility])     Assessment: Pharmacy consulted for heparin infusion dosing and monitoring for ACS/STEMI.  No reported anticoagulates PTA. Patient has PMH of thrombocytopenia. Plts 126 on admission. Hgb: 11.4   Hct: 31.4   CrCl: 56.5  Troponin I (HS): 21>> 20  Goal of Therapy:  Heparin level 0.3-0.7 units/ml Monitor platelets by anticoagulation protocol: Yes   Plan:  1008 @ 0000 HL 0.42 therapeutic. Will continue current rate and will recheck HL @ 0800, CBC trended down slightly, will continue to monitor.  Tobie Lords, PharmD, BCPS Clinical Pharmacist 05/26/2019 1:09 AM

## 2019-05-26 NOTE — Plan of Care (Signed)
Nutrition Education Note   RD consulted for nutrition education regarding diabetes.   73 y.o. male with a known history of hypertension, diabetes, heart disease, COPD and thrombocytopenia admitted with chest pain   Lab Results  Component Value Date   HGBA1C 10.3 (H) 05/26/2019    RD provided "Nutrition and Type II Diabetes" handout from the Academy of Nutrition and Dietetics. Discussed different food groups and their effects on blood sugar, emphasizing carbohydrate-containing foods. Provided list of carbohydrates and recommended serving sizes of common foods.  Discussed importance of controlled and consistent carbohydrate intake throughout the day. Provided examples of ways to balance meals/snacks and encouraged intake of high-fiber, whole grain complex carbohydrates. Teach back method used.  Expect fair compliance.  Body mass index is 22.46 kg/m. Pt meets criteria for normal weight based on current BMI.  Current diet order is CHO modified, patient is consuming approximately 100% of meals at this time. Labs and medications reviewed. No further nutrition interventions warranted at this time. RD contact information provided. If additional nutrition issues arise, please re-consult RD.  Koleen Distance MS, RD, LDN Pager #- [phone] Office#- [phone] After Hours Pager: [phone]

## 2019-05-26 NOTE — Plan of Care (Signed)

## 2019-05-26 NOTE — Progress Notes (Signed)
Inpatient Diabetes Program Recommendations  AACE/ADA: New Consensus Statement on Inpatient Glycemic Control (2015)  Target Ranges:  Prepandial:   less than 140 mg/dL      Peak postprandial:   less than 180 mg/dL (1-2 hours)      Critically ill patients:  140 - 180 mg/dL   Lab Results  Component Value Date   GLUCAP 271 (H) 05/26/2019   HGBA1C 10.3 (H) 05/26/2019    Review of Glycemic Control Results for DARIVS, GOEDEKE (MRN [id number]) as of 05/26/2019 10:22  Ref. Range 05/25/2019 21:16 05/25/2019 22:49 05/26/2019 08:00  Glucose-Capillary Latest Ref Range: 70 - 99 mg/dL 293 (H) 379 (H) 271 (H)   Diabetes history: DM2 Outpatient Diabetes medications: Novolin 70/30 insulin 15 units am + 20 units pm + Glucotrol 10 units bid + Metformin 500 mg bid Current orders for Inpatient glycemic control: Novolog 70/30 units 15 units bid with meals + Novolog sensitive tid + hs 0-5 units  Inpatient Diabetes Program Recommendations:   While patient is NPO status, consider: -Lantus 15 units daily -Change Novolog correction to q 4 hrs.  Thank you, Nani Gasser. Eriyah Fernando, RN, MSN, CDE  Diabetes Coordinator Inpatient Glycemic Control Team Team Pager [phone] (8am-5pm) 05/26/2019 10:44 AM

## 2019-05-26 NOTE — Discharge Summary (Signed)
[facility] [place] NAME: Rick Zuniga    MR#:  [id number]  DATE OF BIRTH:  03-27-1946  DATE OF ADMISSION:  05/25/2019 ADMITTING PHYSICIAN: Demetrios Loll, MD  DATE OF DISCHARGE: 05/26/2019   PRIMARY CARE PHYSICIAN: [name], MD    ADMISSION DIAGNOSIS:  Unstable angina ([place]) [I20.0] Exertional chest pain [R07.9]  DISCHARGE DIAGNOSIS:  Active Problems:   Chest pain   SECONDARY DIAGNOSIS:   Past Medical History:  Diagnosis Date  . Arthritis   . COPD (chronic obstructive pulmonary disease) ([place])   . Diabetes ([place])   . Heart disease   . Hypertension   . Indigestion   . Thrombocytopenia [facility])     HOSPITAL COURSE:   Patient came with chest pain.  Initially was suspected to have ACS.  Serial troponins were followed and monitored on telemetry.  Cardiology consult was done who suggested no anticoagulation need and advised to have a nuclear stress test today. Stress test is done which showed old infarct with some surrounding ischemic changes and it was reported as intermediate risk study. I discussed with cardiologist- Dr.Fath and Dr. Maryan Puls suggested to discharge the patient home with follow-up in his clinic next week. Patient is chest pain-free currently.  We advised to continue his current cardiac medications.  DISCHARGE CONDITIONS:   Stable.  CONSULTS OBTAINED:  Treatment Team:  Teodoro Spray, MD  DRUG ALLERGIES:   Allergies  Allergen Reactions  . Pioglitazone Swelling  . Atorvastatin Rash  . Benazepril Rash  . Latex Rash  . Tape Rash    DISCHARGE MEDICATIONS:   Allergies as of 05/26/2019      Reactions   Pioglitazone Swelling   Atorvastatin Rash   Benazepril Rash   Latex Rash   Tape Rash      Medication List    TAKE these medications   amLODipine 10 MG tablet Commonly known as: NORVASC Take 1 tablet (10 mg total) by mouth daily.   aspirin EC 81 MG tablet Take 81 mg by mouth  daily.   carvedilol 6.25 MG tablet Commonly known as: COREG Take 6.25 mg by mouth 2 (two) times daily with a meal.   glipiZIDE 10 MG tablet Commonly known as: GLUCOTROL Take 10 mg by mouth 2 (two) times daily.   insulin NPH-regular Human (70-30) 100 UNIT/ML injection Inject 8 Units into the skin 2 (two) times daily.   losartan 100 MG tablet Commonly known as: COZAAR Take 100 mg by mouth daily.   nitroGLYCERIN 0.4 MG SL tablet Commonly known as: NITROSTAT Place 0.4 mg under the tongue every 5 (five) minutes as needed for chest pain.        DISCHARGE INSTRUCTIONS:    *Follow with cardiology clinic in 1 to 2 weeks.  If you experience worsening of your admission symptoms, develop shortness of breath, life threatening emergency, suicidal or homicidal thoughts you must seek medical attention immediately by calling 911 or calling your MD immediately  if symptoms less severe.  You Must read complete instructions/literature along with all the possible adverse reactions/side effects for all the Medicines you take and that have been prescribed to you. Take any new Medicines after you have completely understood and accept all the possible adverse reactions/side effects.   Please note  You were cared for by a hospitalist during your hospital stay. If you have any questions about your discharge medications or the care you received while you were in the hospital after you  are discharged, you can call the unit and asked to speak with the hospitalist on call if the hospitalist that took care of you is not available. Once you are discharged, your primary care physician will handle any further medical issues. Please note that NO REFILLS for any discharge medications will be authorized once you are discharged, as it is imperative that you return to your primary care physician (or establish a relationship with a primary care physician if you do not have one) for your aftercare needs so that they can  reassess your need for medications and monitor your lab values.    Today   CHIEF COMPLAINT:   Chief Complaint  Patient presents with  . Chest Pain   HISTORY OF PRESENT ILLNESS:  Rick Zuniga  is a 73 y.o. male with a known history of hypertension, diabetes, heart disease, COPD and thrombocytopenia.  The patient presents to the ED with chest pain for several days.  Chest pain is in the whole chest, sharp and intermittent without radiation, worse with exertion.  He also complains of nausea and diaphoresis.  He denies any orthopnea or leg swelling.  The patient also has right upper quadrant pain and is following GI for colonoscopy. The first 2 troponins are unremarkable.  CT angiogram of the chest did not show any PE.  Dr. Joni Fears start heparin drip and request admission.   VITAL SIGNS:  Blood pressure (!) 160/83, pulse 76, temperature 98.3 F (36.8 C), temperature source Oral, resp. rate 18, height 6\' 1"  (1.854 m), weight 77.2 kg, SpO2 99 %.  I/O:    Intake/Output Summary (Last 24 hours) at 05/26/2019 1541 Last data filed at 05/26/2019 0735 Gross per 24 hour  Intake 154.06 ml  Output 0 ml  Net 154.06 ml    PHYSICAL EXAMINATION:  GENERAL:  73 y.o.-year-old patient lying in the bed with no acute distress.  EYES: Pupils equal, round, reactive to light and accommodation. No scleral icterus. Extraocular muscles intact.  HEENT: Head atraumatic, normocephalic. Oropharynx and nasopharynx clear.  NECK:  Supple, no jugular venous distention. No thyroid enlargement, no tenderness.  LUNGS: Normal breath sounds bilaterally, no wheezing, rales,rhonchi or crepitation. No use of accessory muscles of respiration.  CARDIOVASCULAR: S1, S2 normal. No murmurs, rubs, or gallops.  ABDOMEN: Soft, non-tender, non-distended. Bowel sounds present. No organomegaly or mass.  EXTREMITIES: No pedal edema, cyanosis, or clubbing.  NEUROLOGIC: Cranial nerves II through XII are intact. Muscle strength 5/5 in  all extremities. Sensation intact. Gait not checked.  PSYCHIATRIC: The patient is alert and oriented x 3.  SKIN: No obvious rash, lesion, or ulcer.   DATA REVIEW:   CBC Recent Labs  Lab 05/26/19 0008  WBC 5.1  HGB 10.6*  HCT 29.9*  PLT 108*    Chemistries  Recent Labs  Lab 05/25/19 1150  NA 140  K 4.7  CL 107  CO2 26  GLUCOSE 217*  BUN 23  CREATININE 1.26*  CALCIUM 8.9    Cardiac Enzymes No results for input(s): TROPONINI in the last 168 hours.  Microbiology Results  Results for orders placed or performed during the hospital encounter of 05/25/19  SARS CORONAVIRUS 2 (TAT 6-24 HRS) Nasopharyngeal Nasopharyngeal Swab     Status: None   Collection Time: 05/25/19  1:58 PM   Specimen: Nasopharyngeal Swab  Result Value Ref Range Status   SARS Coronavirus 2 NEGATIVE NEGATIVE Final    Comment: (NOTE) SARS-CoV-2 target nucleic acids are NOT DETECTED. The SARS-CoV-2 RNA is generally detectable  in upper and lower respiratory specimens during the acute phase of infection. Negative results do not preclude SARS-CoV-2 infection, do not rule out co-infections with other pathogens, and should not be used as the sole basis for treatment or other patient management decisions. Negative results must be combined with clinical observations, patient history, and epidemiological information. The expected result is Negative. Fact Sheet for Patients: SugarRoll.be Fact Sheet for Healthcare Providers: https://www.woods-mathews.com/ This test is not yet approved or cleared by the [place] FDA and  has been authorized for detection and/or diagnosis of SARS-CoV-2 by FDA under an Emergency Use Authorization (EUA). This EUA will remain  in effect (meaning this test can be used) for the duration of the COVID-19 declaration under Section 56 4(b)(1) of the Act, 21 U.S.C. section 360bbb-3(b)(1), unless the authorization is terminated or revoked  sooner. Performed at [facility], [place] [address] [place], [place] [address]     RADIOLOGY:  Dg Chest 2 View  Result Date: 05/25/2019 CLINICAL DATA:  Chest pain EXAM: CHEST - 2 VIEW COMPARISON:  04/19/2019 FINDINGS: Cardiac shadow is within normal limits. Postsurgical changes are noted. Blunting of the left costophrenic angle is seen which is new from the prior CT examination consistent with small effusion. No focal confluent infiltrate is seen. No bony abnormality is noted. IMPRESSION: Small left pleural effusion. Electronically Signed   By: Inez Catalina M.D.   On: 05/25/2019 14:01   Ct Head Wo Contrast  Result Date: 05/25/2019 CLINICAL DATA:  Subacute neuro deficits. EXAM: CT HEAD WITHOUT CONTRAST TECHNIQUE: Contiguous axial images were obtained from the base of the skull through the vertex without intravenous contrast. COMPARISON:  10/27/2017 FINDINGS: Brain: There is no evidence for acute hemorrhage, hydrocephalus, mass lesion, or abnormal extra-axial fluid collection. No definite CT evidence for acute infarction. Vascular: No hyperdense vessel or unexpected calcification. Skull: No evidence for fracture. No worrisome lytic or sclerotic lesion. Sinuses/Orbits: The visualized paranasal sinuses and mastoid air cells are clear. Visualized portions of the globes and intraorbital fat are unremarkable. Other: None. IMPRESSION: No acute intracranial abnormality. Electronically Signed   By: Misty Stanley M.D.   On: 05/25/2019 14:34   Nm Myocar Multi W/spect W/wall Motion / Ef  Result Date: 05/26/2019  Blood pressure demonstrated a normal response to exercise.  There was no ST segment deviation noted during stress.  Defect 1: There is a medium defect of mild severity present in the apical anterior, apical inferior, apical lateral and apex location.  Findings consistent with prior myocardial infarction with peri-infarct ischemia.  This is an intermediate risk study.  The left ventricular  ejection fraction is mildly decreased (45-54%).    Ct Angio Chest/abd/pel For Dissection W And/or Wo Contrast  Result Date: 05/25/2019 CLINICAL DATA:  Aortic disease, chest pain, right lower quadrant pain EXAM: CT ANGIOGRAPHY CHEST, ABDOMEN AND PELVIS TECHNIQUE: Multidetector CT imaging through the chest, abdomen and pelvis was performed using the standard protocol during bolus administration of intravenous contrast. Multiplanar reconstructed images and MIPs were obtained and reviewed to evaluate the vascular anatomy. CONTRAST:  169mL OMNIPAQUE IOHEXOL 350 MG/ML SOLN COMPARISON:  None. FINDINGS: CTA CHEST FINDINGS Cardiovascular: Preferential opacification of the thoracic aorta. No evidence of thoracic aortic aneurysm or dissection. Normal heart size. No pericardial effusion. Prior CABG. Extensive coronary artery atherosclerosis. Mild thoracic aortic atherosclerosis. Mediastinum/Nodes: No enlarged mediastinal, hilar, or axillary lymph nodes. Thyroid gland, trachea, and esophagus demonstrate no significant findings. Lungs/Pleura: Small left pleural effusion. Bibasilar mild atelectasis. No focal consolidation.  Musculoskeletal: No chest wall abnormality. No acute or significant osseous findings. Review of the MIP images confirms the above findings. CTA ABDOMEN AND PELVIS FINDINGS VASCULAR Aorta: Normal caliber aorta without aneurysm, dissection, vasculitis or significant stenosis. Mild abdominal aortic atherosclerosis. Celiac: Patent without evidence of aneurysm, dissection, vasculitis or significant stenosis. SMA: Patent without evidence of aneurysm, dissection, vasculitis or significant stenosis. Renals: Both renal arteries are patent without evidence of aneurysm, dissection, vasculitis, fibromuscular dysplasia or significant stenosis. IMA: Patent without evidence of aneurysm, dissection, vasculitis or significant stenosis. Inflow: Patent without evidence of aneurysm, dissection, vasculitis or significant  stenosis. Veins: No obvious venous abnormality within the limitations of this arterial phase study. Review of the MIP images confirms the above findings. NON-VASCULAR Hepatobiliary: No focal liver abnormality is seen. Status post cholecystectomy. No biliary dilatation. Pancreas: Unremarkable. No pancreatic ductal dilatation or surrounding inflammatory changes. Spleen: Normal in size without focal abnormality. Adrenals/Urinary Tract: Adrenal glands are unremarkable. Kidneys are normal, without renal calculi, focal lesion, or hydronephrosis. Bladder is unremarkable. Stomach/Bowel: Stomach is within normal limits. No evidence of bowel wall thickening, distention, or inflammatory changes. Diverticulosis without evidence of diverticulitis. Moderate amount of stool throughout the colon. Lymphatic: No lymphadenopathy. Reproductive: Prostate is unremarkable. Other: No abdominal wall hernia or abnormality. No abdominopelvic ascites. Musculoskeletal: No acute osseous abnormality. No aggressive osseous lesion. Review of the MIP images confirms the above findings. IMPRESSION: 1. No aortic aneurysm or dissection. 2. No acute abnormality of the chest, abdomen or pelvis. 3. Small left pleural effusion.  Mild bibasilar atelectasis. 4. Diverticulosis without evidence of diverticulitis. 5. Moderate amount of stool throughout the colon. Electronically Signed   By: Kathreen Devoid   On: 05/25/2019 14:38    EKG:   Orders placed or performed during the hospital encounter of 05/25/19  . ED EKG  . ED EKG  . EKG 12-Lead (recurrent chest pain)     Management plans discussed with the patient, family and they are in agreement.  CODE STATUS:     Code Status Orders  (From admission, onward)         Start     Ordered   05/25/19 2107  Full code  Continuous     05/25/19 2106        Code Status History    Date Active Date Inactive Code Status Order ID Comments User Context   10/27/2017 1529 10/28/2017 1926 Full Code  [id number]  Loletha Grayer, MD ED   Advance Care Planning Activity      TOTAL TIME TAKING CARE OF THIS PATIENT: 35 minutes.    Vaughan Basta M.D on 05/26/2019 at 3:41 PM  Between 7am to 6pm - Pager - [phone]  After 6pm go to [email or web address] - password [email or web address] [facility]  Office  [phone]  CC: Primary care physician; [name], MD   Note: This dictation was prepared with Dragon dictation along with smaller phrase technology. Any transcriptional errors that result from this process are unintentional.

## 2019-05-26 NOTE — Care Management Obs Status (Signed)
MEDICARE OBSERVATION STATUS NOTIFICATION   Patient Details  Name: Rick Zuniga MRN: [id number] Date of Birth: 1945/11/23   Medicare Observation Status Notification Given:  Yes    Elza Rafter, RN 05/26/2019, 1:27 PM

## 2019-11-24 ENCOUNTER — Encounter (INDEPENDENT_AMBULATORY_CARE_PROVIDER_SITE_OTHER): Payer: Medicare Other | Admitting: Vascular Surgery

## 2019-11-25 ENCOUNTER — Ambulatory Visit
Admission: RE | Admit: 2019-11-25 | Discharge: 2019-11-25 | Disposition: A | Discharge status: Home / Self Care | Payer: Medicare Other | Source: Ambulatory Visit | Attending: Internal Medicine | Admitting: Internal Medicine

## 2019-11-25 ENCOUNTER — Other Ambulatory Visit: Payer: Self-pay

## 2019-11-25 ENCOUNTER — Other Ambulatory Visit: Payer: Self-pay | Admitting: Internal Medicine

## 2019-11-25 DIAGNOSIS — R0602 Shortness of breath: Secondary | ICD-10-CM | POA: Insufficient documentation

## 2019-11-25 MED ORDER — IOHEXOL 350 MG/ML SOLN
75.0000 mL | Freq: Once | INTRAVENOUS | Status: AC | PRN
  Administered 2019-11-25: 14:00:00 60 mL via INTRAVENOUS

## 2019-12-05 ENCOUNTER — Encounter (INDEPENDENT_AMBULATORY_CARE_PROVIDER_SITE_OTHER): Payer: Medicare Other | Admitting: Vascular Surgery

## 2019-12-08 DIAGNOSIS — R0602 Shortness of breath: Secondary | ICD-10-CM | POA: Insufficient documentation

## 2019-12-08 DIAGNOSIS — J90 Pleural effusion, not elsewhere classified: Secondary | ICD-10-CM | POA: Insufficient documentation

## 2019-12-15 ENCOUNTER — Other Ambulatory Visit: Payer: Self-pay | Admitting: Pulmonary Disease

## 2019-12-15 ENCOUNTER — Other Ambulatory Visit
Admission: RE | Admit: 2019-12-15 | Discharge: 2019-12-15 | Disposition: A | Discharge status: Home / Self Care | Payer: Medicare Other | Source: Ambulatory Visit | Attending: Pulmonary Disease | Admitting: Pulmonary Disease

## 2019-12-15 DIAGNOSIS — Z01812 Encounter for preprocedural laboratory examination: Secondary | ICD-10-CM | POA: Diagnosis present

## 2019-12-15 DIAGNOSIS — Z20822 Contact with and (suspected) exposure to covid-19: Secondary | ICD-10-CM | POA: Diagnosis not present

## 2019-12-15 DIAGNOSIS — J90 Pleural effusion, not elsewhere classified: Secondary | ICD-10-CM

## 2019-12-15 LAB — SARS CORONAVIRUS 2 (TAT 6-24 HRS): SARS Coronavirus 2: NEGATIVE

## 2019-12-16 ENCOUNTER — Other Ambulatory Visit: Payer: Self-pay | Admitting: Pulmonary Disease

## 2019-12-16 ENCOUNTER — Ambulatory Visit
Admission: RE | Admit: 2019-12-16 | Discharge: 2019-12-16 | Disposition: A | Discharge status: Home / Self Care | Payer: Medicare Other | Source: Ambulatory Visit | Attending: Pulmonary Disease | Admitting: Pulmonary Disease

## 2019-12-16 ENCOUNTER — Other Ambulatory Visit: Payer: Self-pay

## 2019-12-16 DIAGNOSIS — J90 Pleural effusion, not elsewhere classified: Secondary | ICD-10-CM

## 2019-12-16 NOTE — Progress Notes (Signed)
Patient presents for diagnostic thoracentesis. [place] limited chest shows trace amount of thoracic fluid noted  Insufficient to perform a safe thoracentesis. Procedure not performed.

## 2019-12-19 ENCOUNTER — Ambulatory Visit (INDEPENDENT_AMBULATORY_CARE_PROVIDER_SITE_OTHER): Payer: Medicare Other | Admitting: Vascular Surgery

## 2019-12-19 ENCOUNTER — Encounter (INDEPENDENT_AMBULATORY_CARE_PROVIDER_SITE_OTHER): Payer: Self-pay | Admitting: Vascular Surgery

## 2019-12-19 ENCOUNTER — Other Ambulatory Visit: Payer: Self-pay

## 2019-12-19 VITALS — BP 141/65 | HR 59 | Resp 16 | Ht 73.0 in | Wt 175.0 lb

## 2019-12-19 DIAGNOSIS — E1159 Type 2 diabetes mellitus with other circulatory complications: Secondary | ICD-10-CM

## 2019-12-19 DIAGNOSIS — I872 Venous insufficiency (chronic) (peripheral): Secondary | ICD-10-CM

## 2019-12-19 DIAGNOSIS — R809 Proteinuria, unspecified: Secondary | ICD-10-CM

## 2019-12-19 DIAGNOSIS — G4762 Sleep related leg cramps: Secondary | ICD-10-CM | POA: Diagnosis not present

## 2019-12-19 DIAGNOSIS — I25118 Atherosclerotic heart disease of native coronary artery with other forms of angina pectoris: Secondary | ICD-10-CM

## 2019-12-19 DIAGNOSIS — E1129 Type 2 diabetes mellitus with other diabetic kidney complication: Secondary | ICD-10-CM

## 2019-12-19 DIAGNOSIS — Z794 Long term (current) use of insulin: Secondary | ICD-10-CM

## 2019-12-19 DIAGNOSIS — I10 Essential (primary) hypertension: Secondary | ICD-10-CM

## 2019-12-21 ENCOUNTER — Encounter (INDEPENDENT_AMBULATORY_CARE_PROVIDER_SITE_OTHER): Payer: Self-pay | Admitting: Vascular Surgery

## 2019-12-21 DIAGNOSIS — G4762 Sleep related leg cramps: Secondary | ICD-10-CM | POA: Insufficient documentation

## 2019-12-21 DIAGNOSIS — I872 Venous insufficiency (chronic) (peripheral): Secondary | ICD-10-CM | POA: Insufficient documentation

## 2019-12-21 NOTE — Progress Notes (Signed)
MRN : [id number]  ISSAC MOURE is a 74 y.o. (September 17, 1945) male who presents with chief complaint of  Chief Complaint  Patient presents with  . New Patient (Initial Visit)    ref Parascho venous insufficiency  .  History of Present Illness:   Patient is seen for evaluation of leg pain and leg swelling. The patient first noticed the swelling remotely. The swelling is associated with pain and discoloration. The pain and swelling worsens with prolonged dependency and improves with elevation. The pain is unrelated to activity.  The patient notes that in the morning the legs are significantly improved but they steadily worsened throughout the course of the day. The patient also notes a steady worsening of the discoloration in the ankle and shin area.   The patient denies claudication symptoms.  The patient denies symptoms consistent with rest pain.  The patient denies and extensive history of DJD and LS spine disease.  The patient has no had any past angiography, interventions or vascular surgery.  Elevation makes the leg symptoms better, dependency makes them much worse. There is no history of ulcerations. The patient denies any recent changes in medications.  The patient has not been wearing graduated compression.  The patient is also describes a cramping or Charley horse type pain. The patient notes the pain isn't associated with activity and is not very consistent day to day. The pain seems to be variable with time. Typically the pain occurs with varying positions and seems to progress until the leg is stretched. The pain has been progressive over the past several years which has prompted the concern for evaluation. The patient states this inability to walk has a significant negative impact on her quality of life and daily activities.  The patient denies a history of degenerative spine disease.  The patient denies rest pain. The patient denies dangling off the affected extremity during  the night for pain relief. There are no open wounds or sores at this time.  No prior vascular interventions or vascular surgeries.  The patient denies a history of DVT or PE. There is no prior history of phlebitis. There is no history of primary lymphedema.  No history of malignancies. No history of trauma or groin or pelvic surgery. There is no history of radiation treatment to the groin or pelvis  The patient denies amaurosis fugax or recent TIA symptoms. There are no recent neurological changes noted. The patient denies recent episodes of angina or shortness of breath  Current Meds  Medication Sig  . amLODipine (NORVASC) 10 MG tablet Take 1 tablet (10 mg total) by mouth daily.  Marland Kitchen aspirin EC 81 MG tablet Take 81 mg by mouth daily.   . carvedilol (COREG) 6.25 MG tablet Take 6.25 mg by mouth 2 (two) times daily with a meal.  . glipiZIDE (GLUCOTROL) 10 MG tablet Take 10 mg by mouth 2 (two) times daily.   . insulin NPH-regular Human (NOVOLIN 70/30) (70-30) 100 UNIT/ML injection Inject 8 Units into the skin 2 (two) times daily.   Marland Kitchen losartan (COZAAR) 100 MG tablet Take 100 mg by mouth daily.  . nitroGLYCERIN (NITROSTAT) 0.4 MG SL tablet Place 0.4 mg under the tongue every 5 (five) minutes as needed for chest pain.  Marland Kitchen torsemide (DEMADEX) 20 MG tablet Take by mouth.    Past Medical History:  Diagnosis Date  . Arthritis   . COPD (chronic obstructive pulmonary disease) ([place])   . Diabetes ([place])   . Heart disease   .  Hypertension   . Indigestion   . Thrombocytopenia ([place])     Past Surgical History:  Procedure Laterality Date  . APPENDECTOMY    . CARDIAC SURGERY     bypass  . CHOLECYSTECTOMY    . COLONOSCOPY  12/23/2010    Social History Social History   Tobacco Use  . Smoking status: Never Smoker  . Smokeless tobacco: Never Used  Substance Use Topics  . Alcohol use: No  . Drug use: No    Family History Family History  Problem Relation Age of Onset  . CAD Mother   .  Kidney failure Father   . Diabetes Father   No family history of bleeding/clotting disorders, porphyria or autoimmune disease   Allergies  Allergen Reactions  . Pioglitazone Swelling  . Atorvastatin Rash  . Benazepril Rash  . Latex Rash  . Tape Rash     REVIEW OF SYSTEMS (Negative unless checked)  Constitutional: [] Weight loss  [] Fever  [] Chills Cardiac: [] Chest pain   [] Chest pressure   [] Palpitations   [] Shortness of breath when laying flat   [] Shortness of breath with exertion. Vascular:  [] Pain in legs with walking   [x] Pain in legs at rest  [] History of DVT   [] Phlebitis   [x] Swelling in legs   [x] Varicose veins   [] Non-healing ulcers Pulmonary:   [] Uses home oxygen   [] Productive cough   [] Hemoptysis   [] Wheeze  [] COPD   [] Asthma Neurologic:  [] Dizziness   [] Seizures   [] History of stroke   [] History of TIA  [] Aphasia   [] Vissual changes   [] Weakness or numbness in arm   [] Weakness or numbness in leg Musculoskeletal:   [] Joint swelling   [] Joint pain   [] Low back pain Hematologic:  [] Easy bruising  [] Easy bleeding   [] Hypercoagulable state   [] Anemic Gastrointestinal:  [] Diarrhea   [] Vomiting  [] Gastroesophageal reflux/heartburn   [] Difficulty swallowing. Genitourinary:  [] Chronic kidney disease   [] Difficult urination  [] Frequent urination   [] Blood in urine Skin:  [x] Rashes   [] Ulcers  Psychological:  [] History of anxiety   []  History of major depression.  Physical Examination  Vitals:   12/19/19 1537  BP: (!) 141/65  Pulse: (!) 59  Resp: 16  Weight: 175 lb (79.4 kg)  Height: 6\' 1"  (1.854 m)   Body mass index is 23.09 kg/m. Gen: WD/WN, NAD Head: [place]/AT, No temporalis wasting.  Ear/Nose/Throat: Hearing grossly intact, nares w/o erythema or drainage, poor dentition Eyes: PER, EOMI, sclera nonicteric.  Neck: Supple, no masses.  No bruit or JVD.  Pulmonary:  Good air movement, clear to auscultation bilaterally, no use of accessory muscles.  Cardiac: RRR, normal S1,  S2, no Murmurs. Vascular: scattered varicosities present bilaterally.  Mild to moderate venous stasis changes to the legs bilaterally.  3+ soft pitting edema Vessel Right Left  PT Palpable Palpable  DP Palpable Palpable  Gastrointestinal: soft, non-distended. No guarding/no peritoneal signs.  Musculoskeletal: M/S 5/5 throughout.  No deformity or atrophy.  Neurologic: CN 2-12 intact. Pain and light touch intact in extremities.  Symmetrical.  Speech is fluent. Motor exam as listed above. Psychiatric: Judgment intact, Mood & affect appropriate for pt's clinical situation. Dermatologic: venous rashes no ulcers noted.  No changes consistent with cellulitis. Lymph : No Cervical lymphadenopathy, no lichenification or skin changes of chronic lymphedema.  CBC Lab Results  Component Value Date   WBC 5.1 05/26/2019   HGB 10.6 (L) 05/26/2019   HCT 29.9 (L) 05/26/2019   MCV 90.9 05/26/2019  PLT 108 (L) 05/26/2019    BMET    Component Value Date/Time   NA 140 05/25/2019 1150   K 4.7 05/25/2019 1150   CL 107 05/25/2019 1150   CO2 26 05/25/2019 1150   GLUCOSE 217 (H) 05/25/2019 1150   BUN 23 05/25/2019 1150   CREATININE 1.26 (H) 05/25/2019 1150   CALCIUM 8.9 05/25/2019 1150   GFRNONAA 56 (L) 05/25/2019 1150   GFRAA >60 05/25/2019 1150   CrCl cannot be calculated (Patient's most recent lab result is older than the maximum 21 days allowed.).  COAG Lab Results  Component Value Date   INR 1.1 05/25/2019    Radiology CT ANGIO CHEST PE W OR WO CONTRAST  Result Date: 11/25/2019 CLINICAL DATA:  Shortness of breath and right upper quadrant abdominal pain for several weeks. EXAM: CT ANGIOGRAPHY CHEST WITH CONTRAST TECHNIQUE: Multidetector CT imaging of the chest was performed using the standard protocol during bolus administration of intravenous contrast. Multiplanar CT image reconstructions and MIPs were obtained to evaluate the vascular anatomy. CONTRAST:  59mL OMNIPAQUE IOHEXOL 350 MG/ML SOLN  COMPARISON:  05/25/2019 FINDINGS: Cardiovascular: No pulmonary emboli. Coronary artery calcifications. CABG. Interval enlargement of the of the heart, now at the upper limits of normal. No pericardial effusion. Mediastinum/Nodes: No enlarged mediastinal, hilar, or axillary lymph nodes. Thyroid gland, trachea, and esophagus demonstrate no significant findings. Lungs/Pleura: The patient now has a moderate left pleural effusion, increased since the prior study. The lungs are clear except for minimal compressive atelectasis at the left lung base secondary to the effusion. Upper Abdomen: No acute abnormality. Musculoskeletal: No chest wall abnormality. No acute or significant osseous findings. Review of the MIP images confirms the above findings. IMPRESSION: 1. No pulmonary emboli. 2. Moderate left pleural effusion, increased since the prior study. 3. Interval enlargement of the of the heart, now at the upper limits of normal. Electronically Signed   By: Lorriane Shire M.D.   On: 11/25/2019 14:14   [place] CHEST (PLEURAL EFFUSION)  Result Date: 12/16/2019 CLINICAL DATA:  74 year old male with possible right pleural effusion EXAM: CHEST ULTRASOUND COMPARISON:  None. FINDINGS: Limited ultrasound of the right chest performed. Scant right pleural fluid identified. No thoracentesis performed, given the scant fluid. IMPRESSION: Scant right-sided pleural fluid on ultrasound with no thoracentesis performed. Electronically Signed   By: Corrie Mckusick D.O.   On: 12/16/2019 16:27     Assessment/Plan 1. Chronic venous insufficiency No surgery or intervention at this point in time.  I have reviewed my discussion with the patient regarding venous insufficiency and why it causes symptoms. I have discussed with the patient the chronic skin changes that accompany venous insufficiency and the long term sequela such as ulceration. Patient will contnue wearing graduated compression stockings on a daily basis, as this has provided  excellent control of his edema. The patient will put the stockings on first thing in the morning and removing them in the evening. The patient is reminded not to sleep in the stockings.  In addition, behavioral modification including elevation during the day will be initiated. Exercise is strongly encouraged.  Given the patient's good control and lack of any problems regarding the venous insufficiency and lymphedema a lymph pump in not need at this time.  The patient will follow up with me PRN should anything change.  The patient voices agreement with this plan.   2. Nocturnal leg cramps Recommend:  The patient is describing Charley horse type leg cramps. No invasive studies, angiography or surgery at  this time.    I have reviewed homeopathic remedies such as Cider vinegar or mustard; placing a bar of soap at the bottom of the bed. Quinine is also an option, however, if he chooses to use this on a routine basis I have asked him to let Dr. Lorinda Creed no Magnesium supplementation at bedtime was also reviewed but he is also struggling with diarrhea for which she is seeing GI in the near future.  So I did warn him that magnesium supplementation at this time may make that worse.  The patient should continue walking and begin a more formal exercise program.  The patient should continue antiplatelet therapy and aggressive treatment of the lipid abnormalities  The patient should continue wearing graduated compression socks 15-20 mmHg strength to control his edema.  The patient will follow up with me on a PRN basis.   3. Hypertension associated with diabetes ([place]) Continue antihypertensive medications as already ordered, these medications have been reviewed and there are no changes at this time.   4. Coronary artery disease of native artery of native heart with stable angina pectoris (HCC) Continue cardiac and antihypertensive medications as already ordered and reviewed, no changes at this  time.  Continue statin as ordered and reviewed, no changes at this time  Nitrates PRN for chest pain   5. Type 2 diabetes mellitus with microalbuminuria, with long-term current use of insulin (HCC) Continue hypoglycemic medications as already ordered, these medications have been reviewed and there are no changes at this time.  Hgb A1C to be monitored as already arranged by primary service     Hortencia Pilar, MD  12/21/2019 8:46 AM

## 2020-01-04 ENCOUNTER — Ambulatory Visit (INDEPENDENT_AMBULATORY_CARE_PROVIDER_SITE_OTHER): Payer: Medicare Other | Admitting: Gastroenterology

## 2020-01-04 ENCOUNTER — Other Ambulatory Visit: Payer: Self-pay

## 2020-01-04 ENCOUNTER — Encounter: Payer: Self-pay | Admitting: Gastroenterology

## 2020-01-04 VITALS — BP 151/60 | HR 74 | Temp 96.6°F | Ht 73.0 in | Wt 181.6 lb

## 2020-01-04 DIAGNOSIS — I25118 Atherosclerotic heart disease of native coronary artery with other forms of angina pectoris: Secondary | ICD-10-CM | POA: Diagnosis not present

## 2020-01-04 DIAGNOSIS — R197 Diarrhea, unspecified: Secondary | ICD-10-CM

## 2020-01-04 DIAGNOSIS — R634 Abnormal weight loss: Secondary | ICD-10-CM

## 2020-01-04 DIAGNOSIS — R109 Unspecified abdominal pain: Secondary | ICD-10-CM

## 2020-01-04 NOTE — Progress Notes (Signed)
Gastroenterology Consultation  Referring Provider:     [name], MD Primary Care Physician:  [name], MD Primary Gastroenterologist:  Dr. Allen Norris     Reason for Consultation:     Change in bowel habits and right upper quadrant pain        HPI:   Rick Zuniga is a 74 y.o. y/o male referred for consultation & management of change in bowel habits and right upper quadrant pain by Dr. Edwina Barth, Chrystie Nose, MD.  This patient comes in for second opinion after seeing Dr. Alice Reichert for the symptoms described above.  The patient has been reporting 8-10 bowel movements a day going on for many years.  The bowel movements were usually watery.  When the patient was seen in September of last year he had reported to Dr. Alice Reichert that he was having progressively worse right upper quadrant pain.  In September 2020 the patient had a CT scan that showed:  IMPRESSION: 1. No acute abnormality or explanation for right lower quadrant pain. 2. Moderate colonic stool burden with fecalization of distal small bowel contents, can be seen with constipation. 3. Incidental duodenal diverticulum. 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 4. Enlarged prostate gland. 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 No perivesicular edema. Aortic Atherosclerosis  The patient was set up for an EGD and colonoscopy by Dr. Alice Reichert in October of last year but when the patient found out that it was not being done by Dr. Alice Reichert and was being done by Dr. Bary Castilla from surgery he was not happy and reported some and feedback he had gotten from friends and family and did not go through with the procedures.  The patient also had stool studies that were negative. The patient denies any right-sided abdominal pain at the present time but reports that he  has some left-sided abdominal pain that is most commonly present when he has constipation. The patient does report that he has lost approximately 40 pounds since all this started.  His diet does include a frequent bowl of cereal and other dairy products.  Past Medical History:  Diagnosis Date  . Arthritis   . COPD (chronic obstructive pulmonary disease) ([place])   . Diabetes ([place])   . Heart disease   . Hypertension   . Indigestion   . Thrombocytopenia ([place])     Past Surgical History:  Procedure Laterality Date  . APPENDECTOMY    . CARDIAC SURGERY     bypass  . CHOLECYSTECTOMY    . COLONOSCOPY  12/23/2010    Prior to Admission medications   Medication Sig Start Date End Date Taking? Authorizing Provider  amLODipine (NORVASC) 10 MG tablet Take 1 tablet (10 mg total) by mouth daily. 05/26/19 12/19/19  Vaughan Basta, MD  aspirin EC 81 MG tablet Take 81 mg by mouth daily.     [provider]  carvedilol (COREG) 6.25 MG tablet Take 6.25 mg by mouth 2 (two) times daily with a meal.    [provider]  glipiZIDE (GLUCOTROL) 10 MG tablet Take 10 mg by mouth 2 (two) times daily.     [provider]  insulin NPH-regular Human (NOVOLIN 70/30) (70-30) 100 UNIT/ML injection Inject 8 Units into the skin 2 (two) times daily.     [provider]  losartan (COZAAR) 100 MG tablet Take 100 mg by  mouth daily.    [provider]  nitroGLYCERIN (NITROSTAT) 0.4 MG SL tablet Place 0.4 mg under the tongue every 5 (five) minutes as needed for chest pain.    [provider]  torsemide (DEMADEX) 20 MG tablet Take by mouth. 12/05/19   [provider]    Family History  Problem Relation Age of Onset  . CAD Mother   . Kidney failure Father   . Diabetes Father      Social History   Tobacco Use  . Smoking status: Never Smoker  . Smokeless tobacco: Never Used  Substance Use Topics  . Alcohol use: No  . Drug use: No    Allergies as of  01/04/2020 - Review Complete 12/21/2019  Allergen Reaction Noted  . Pioglitazone Swelling 02/14/2016  . Atorvastatin Rash 02/14/2016  . Benazepril Rash 02/14/2016  . Latex Rash 02/14/2016  . Tape Rash 02/14/2016    Review of Systems:    All systems reviewed and negative except where noted in HPI.   Physical Exam:  There were no vitals taken for this visit. No LMP for male patient. General:   Alert,  Well-developed, well-nourished, pleasant and cooperative in NAD Head:  Normocephalic and atraumatic. Eyes:  Sclera clear, no icterus.   Conjunctiva pink. Ears:  Normal auditory acuity. Neck:  Supple; no masses or thyromegaly. Lungs:  Respirations even and unlabored.  Clear throughout to auscultation.   No wheezes, crackles, or rhonchi. No acute distress. Heart:  Regular rate and rhythm; no murmurs, clicks, rubs, or gallops. Abdomen:  Normal bowel sounds.  No bruits.  Soft, non-tender and non-distended without masses, hepatosplenomegaly or hernias noted.  No guarding or rebound tenderness.  Negative Carnett sign.   Rectal:  Deferred.  Pulses:  Normal pulses noted. Extremities:  No clubbing or edema.  No cyanosis. Neurologic:  Alert and oriented x3;  grossly normal neurologically. Skin:  Intact without significant lesions or rashes.  No jaundice. Lymph Nodes:  No significant cervical adenopathy. Psych:  Alert and cooperative. Normal mood and affect.  Imaging Studies: [place] CHEST (PLEURAL EFFUSION)  Result Date: 12/16/2019 CLINICAL DATA:  74 year old male with possible right pleural effusion EXAM: CHEST ULTRASOUND COMPARISON:  None. FINDINGS: Limited ultrasound of the right chest performed. Scant right pleural fluid identified. No thoracentesis performed, given the scant fluid. IMPRESSION: Scant right-sided pleural fluid on ultrasound with no thoracentesis performed. Electronically Signed   By: Rick Zuniga D.O.   On: 12/16/2019 16:27    Assessment and Plan:   Rick Zuniga is a 74  y.o. y/o male who comes in today after being seen at another clinic with right-sided abdominal pain that does not seem to be a problem at the present time.  The patient does have a 40 pound weight loss with diarrhea and a CT scan showing a large stool burden.  The patient has not had a screening colonoscopy in 10 years.  The patient will be set up for an EGD and colonoscopy to evaluate his GI tract for a possible cause of his weight loss and diarrhea.  He will also try to avoid dairy products for 1 week to see if that helps his symptoms.  The patient and his significant other have been explained the plan and agree with it.    Lucilla Lame, MD. Marval Regal    Note: This dictation was prepared with Dragon dictation along with smaller phrase technology. Any transcriptional errors that result from this process are unintentional.

## 2020-01-11 ENCOUNTER — Other Ambulatory Visit: Payer: Self-pay

## 2020-01-11 ENCOUNTER — Encounter: Payer: Self-pay | Admitting: Anesthesiology

## 2020-01-11 ENCOUNTER — Encounter: Payer: Self-pay | Admitting: Gastroenterology

## 2020-01-17 ENCOUNTER — Ambulatory Visit: Payer: Medicare Other | Admitting: Gastroenterology

## 2020-01-17 ENCOUNTER — Other Ambulatory Visit: Payer: Self-pay

## 2020-01-17 ENCOUNTER — Other Ambulatory Visit
Admission: RE | Admit: 2020-01-17 | Discharge: 2020-01-17 | Disposition: A | Discharge status: Home / Self Care | Payer: Medicare Other | Source: Ambulatory Visit | Attending: Gastroenterology | Admitting: Gastroenterology

## 2020-01-17 DIAGNOSIS — Z20822 Contact with and (suspected) exposure to covid-19: Secondary | ICD-10-CM | POA: Diagnosis not present

## 2020-01-17 DIAGNOSIS — Z01812 Encounter for preprocedural laboratory examination: Secondary | ICD-10-CM | POA: Insufficient documentation

## 2020-01-17 LAB — SARS CORONAVIRUS 2 (TAT 6-24 HRS): SARS Coronavirus 2: NEGATIVE

## 2020-01-18 NOTE — Discharge Instructions (Signed)
General Anesthesia, Adult, Care After This sheet gives you information about how to care for yourself after your procedure. Your health care provider may also give you more specific instructions. If you have problems or questions, contact your health care provider. What can I expect after the procedure? After the procedure, the following side effects are common:  Pain or discomfort at the IV site.  Nausea.  Vomiting.  Sore throat.  Trouble concentrating.  Feeling cold or chills.  Weak or tired.  Sleepiness and fatigue.  Soreness and body aches. These side effects can affect parts of the body that were not involved in surgery. Follow these instructions at home:  For at least 24 hours after the procedure:  Have a responsible adult stay with you. It is important to have someone help care for you until you are awake and alert.  Rest as needed.  Do not: ? Participate in activities in which you could fall or become injured. ? Drive. ? Use heavy machinery. ? Drink alcohol. ? Take sleeping pills or medicines that cause drowsiness. ? Make important decisions or sign legal documents. ? Take care of children on your own. Eating and drinking  Follow any instructions from your health care provider about eating or drinking restrictions.  When you feel hungry, start by eating small amounts of foods that are soft and easy to digest (bland), such as toast. Gradually return to your regular diet.  Drink enough fluid to keep your urine pale yellow.  If you vomit, rehydrate by drinking water, juice, or clear broth. General instructions  If you have sleep apnea, surgery and certain medicines can increase your risk for breathing problems. Follow instructions from your health care provider about wearing your sleep device: ? Anytime you are sleeping, including during daytime naps. ? While taking prescription pain medicines, sleeping medicines, or medicines that make you drowsy.  Return to  your normal activities as told by your health care provider. Ask your health care provider what activities are safe for you.  Take over-the-counter and prescription medicines only as told by your health care provider.  If you smoke, do not smoke without supervision.  Keep all follow-up visits as told by your health care provider. This is important. Contact a health care provider if:  You have nausea or vomiting that does not get better with medicine.  You cannot eat or drink without vomiting.  You have pain that does not get better with medicine.  You are unable to pass urine.  You develop a skin rash.  You have a fever.  You have redness around your IV site that gets worse. Get help right away if:  You have difficulty breathing.  You have chest pain.  You have blood in your urine or stool, or you vomit blood. Summary  After the procedure, it is common to have a sore throat or nausea. It is also common to feel tired.  Have a responsible adult stay with you for the first 24 hours after general anesthesia. It is important to have someone help care for you until you are awake and alert.  When you feel hungry, start by eating small amounts of foods that are soft and easy to digest (bland), such as toast. Gradually return to your regular diet.  Drink enough fluid to keep your urine pale yellow.  Return to your normal activities as told by your health care provider. Ask your health care provider what activities are safe for you. This information is not   intended to replace advice given to you by your health care provider. Make sure you discuss any questions you have with your health care provider. Document Revised: 08/07/2017 Document Reviewed: 03/20/2017 Elsevier Patient Education  2020 [employer]  

## 2020-01-19 ENCOUNTER — Ambulatory Visit: Admission: RE | Admit: 2020-01-19 | Payer: Medicare Other | Source: Home / Self Care | Admitting: Gastroenterology

## 2020-01-19 HISTORY — DX: Presence of dental prosthetic device (complete) (partial): Z97.2

## 2020-01-19 SURGERY — COLONOSCOPY WITH PROPOFOL
Anesthesia: Choice

## 2020-02-02 NOTE — Progress Notes (Addendum)
[name]  02/06/2020     CHIEF COMPLAINT Patient presents for Retina Evaluation   HISTORY OF PRESENT ILLNESS: Rick Zuniga is a 74 y.o. male who presents to the clinic today for:   HPI    Retina Evaluation    In right eye.  This started 3 weeks ago.  Duration of 3 weeks.  Associated Symptoms Flashes and Floaters.  Context:  distance vision and near vision.  I, the attending physician,  performed the HPI with the patient and updated documentation appropriately.          Comments    A1c: ">10.0" BS: "Over 200 this morning"  Pt states he has noticed a change in his vision over the last 3 weeks.  Pt states his vision was gradually getting blurry and all of a sudden got much worse.  Patient complains of blurry vision OU but states OS is worse than OD.  Pt denies eye pain or discomfort and denies any new or worsening floaters or fol OU.  Pt states he has been retaining fluid in legs, feet, and ankles and has an appointment coming up with Nephrology to discuss kidney function.       Last edited by [name], MD on 02/07/2020  9:44 PM. (History)    Patient states his vision became blurry about 3 weeks ago and suddenly got much worse.  Patient complains of blurry vision in both eyes but states left eye is worse than right.  Patient reports that A1c is over 10.0 and BS is over 200.  Patient reports that he is retaining fluid in legs, feet, and ankles and has consult coming up with Nephrology to discuss kidney function. Pt denies any ocular surgeries, denies history of laser treatments OU and denies any history of intravitreal injections, pt has been a pt of Dr. Waynetta Sandy for "years", but this is the first time he has been told that he has diabetic damage, pts PCP is Dr. Edwina Barth at [facility], pt states he takes glipizide and insulin, he states he will not take metformin, he states it is hard to control his diabetes and he doesn't eat as  well as he should, pt does commercial landscaping and states a lot of times he does not stop to eat lunch, he states he takes medication for BP as well  Referring physician: Anell Barr, OD [place],  [place] [address]  HISTORICAL INFORMATION:   Selected notes from the [facility] Referred by Dr. Ellin Mayhew for concern of DME OD   CURRENT MEDICATIONS: No current outpatient medications on file. (Ophthalmic Drugs)   No current facility-administered medications for this visit. (Ophthalmic Drugs)   Current Outpatient Medications (Other)  Medication Sig  . amLODipine (NORVASC) 10 MG tablet Take 1 tablet (10 mg total) by mouth daily.  Marland Kitchen aspirin EC 81 MG tablet Take 81 mg by mouth daily.   . carvedilol (COREG) 6.25 MG tablet Take 6.25 mg by mouth 2 (two) times daily with a meal.  . glipiZIDE (GLUCOTROL) 10 MG tablet Take 10 mg by mouth 2 (two) times daily.   . insulin NPH-regular Human (NOVOLIN 70/30) (70-30) 100 UNIT/ML injection Inject 8 Units into the skin 2 (two) times daily.   Marland Kitchen losartan (COZAAR) 100 MG tablet Take 100 mg by mouth daily.  . nitroGLYCERIN (NITROSTAT) 0.4 MG SL tablet Place 0.4 mg under the tongue every 5 (five) minutes as needed for chest pain.  Marland Kitchen  torsemide (DEMADEX) 20 MG tablet Take by mouth.   No current facility-administered medications for this visit. (Other)      REVIEW OF SYSTEMS: ROS    Positive for: Endocrine, Eyes   Negative for: Constitutional, Gastrointestinal, Neurological, Skin, Genitourinary, Musculoskeletal, HENT, Cardiovascular, Respiratory, Psychiatric, Allergic/Imm, Heme/Lymph   Last edited by Doneen Poisson on 02/06/2020  2:44 PM. (History)       ALLERGIES Allergies  Allergen Reactions  . Pioglitazone Swelling  . Atorvastatin Rash  . Benazepril Rash  . Latex Rash    (tape only)  . Tape Rash    PAST MEDICAL HISTORY Past Medical History:  Diagnosis Date  . Arthritis   . COPD (chronic obstructive pulmonary disease)  ([place])   . Diabetes ([place])   . Heart disease   . Hypertension   . Indigestion   . Thrombocytopenia ([place])   . Wears dentures    full upper and lower   Past Surgical History:  Procedure Laterality Date  . APPENDECTOMY    . CARDIAC SURGERY     bypass  . CHOLECYSTECTOMY    . COLONOSCOPY  12/23/2010    FAMILY HISTORY Family History  Problem Relation Age of Onset  . CAD Mother   . Kidney failure Father   . Diabetes Father     SOCIAL HISTORY Social History   Tobacco Use  . Smoking status: Never Smoker  . Smokeless tobacco: Never Used  Vaping Use  . Vaping Use: Never used  Substance Use Topics  . Alcohol use: No  . Drug use: No         OPHTHALMIC EXAM:  Base Eye Exam    Visual Acuity (Snellen - Linear)      Right Left   Dist [place] 20/150 -1 20/150 -1   Dist ph [place] 20/100 -2 NI       Tonometry (Tonopen, 2:55 PM)      Right Left   Pressure 13 11       Pupils      Dark Light Shape React APD   Right 3 2 Round Minimal 0   Left 3 2 Round Minimal 0       Visual Fields      Left Right    Full Full       Extraocular Movement      Right Left    Full Full       Neuro/Psych    Oriented x3: Yes   Mood/Affect: Normal       Dilation    Both eyes: 1.0% Mydriacyl, 2.5% Phenylephrine @ 2:55 PM        Slit Lamp and Fundus Exam    Slit Lamp Exam      Right Left   Lids/Lashes Dermatochalasis - upper lid, Meibomian gland dysfunction Dermatochalasis - upper lid, Meibomian gland dysfunction   Conjunctiva/Sclera Nasal and temporal Pinguecula Nasal and temporal Pinguecula   Cornea arcus, 2+ Punctate epithelial erosions arcus, 2+ inferior Punctate epithelial erosions   Anterior Chamber deep, narrow temporal angle deep, narrow temporal angle   Iris Round and dilated Round and dilated   Lens 2-3+ Nuclear sclerosis, 2-3+ Cortical cataract 2-3+ Nuclear sclerosis, 2-3+ Cortical cataract   Vitreous Vitreous syneresis Vitreous syneresis, Posterior vitreous detachment,  vitreous condensations       Fundus Exam      Right Left   Disc Pink and Sharp, no NVD, temporal PPA, +SVP Pink and Sharp, no NVD, temporal PPA, +SVP   C/D Ratio 0.5 0.4  Macula Blunted foveal reflex, scattered IRH/DBH, +central edema Blunted foveal reflex, scattered IRH/DBH, +central edema   Vessels Vascular attenuation, Tortuous, no NV Vascular attenuation, Tortuous, no NV   Periphery Attached, 360IRH/DBH, reticular degeneration Attached, 360IRH/DBH, reticular degeneration, pigmented CR scar at 0130        Refraction    Wearing Rx      Sphere   Right OTC   Left OTC       Manifest Refraction (Retinoscopy)      Sphere Cylinder Axis Dist VA   Right -0.50 +3.00 030 20/60+1   Left +0.50 +2.50 175 20/70-2          IMAGING AND PROCEDURES  Imaging and Procedures for @TODAY @  OCT, Retina - OU - Both Eyes       Right Eye Quality was good. Central Foveal Thickness: 573. Progression has no prior data. Findings include abnormal foveal contour, intraretinal fluid, subretinal fluid.   Left Eye Quality was good. Central Foveal Thickness: 506. Progression has no prior data. Findings include abnormal foveal contour, intraretinal fluid, subretinal fluid.   Notes *Images captured and stored on drive  Diagnosis / Impression:  +DME OU  Clinical management:  See below  Abbreviations: NFP - Normal foveal profile. CME - cystoid macular edema. PED - pigment epithelial detachment. IRF - intraretinal fluid. SRF - subretinal fluid. EZ - ellipsoid zone. ERM - epiretinal membrane. ORA - outer retinal atrophy. ORT - outer retinal tubulation. SRHM - subretinal hyper-reflective material         Fluorescein Angiography Optos (Transit OS)       Right Eye   Progression has no prior data. Early phase findings include microaneurysm, vascular perfusion defect. Mid/Late phase findings include microaneurysm, vascular perfusion defect, leakage (No NV).   Left Eye   Progression has no  prior data. Early phase findings include delayed filling, blockage, microaneurysm, vascular perfusion defect (Delayed venous return). Mid/Late phase findings include blockage, microaneurysm, vascular perfusion defect, leakage (No NV).   Notes **Images stored on drive**  Impression: Severe NPDR OU Late leaking MA OU Vascular perfusion defects OU No NV OU        Intravitreal Injection, Pharmacologic Agent - OD - Right Eye       Time Out 02/06/2020. 5:01 PM. Confirmed correct patient, procedure, site, and patient consented.   Anesthesia Topical anesthesia was used. Anesthetic medications included Lidocaine 2%, Proparacaine 0.5%.   Procedure Preparation included 5% betadine to ocular surface, eyelid speculum. A supplied needle was used.   Injection:  1.25 mg Bevacizumab (AVASTIN) SOLN   NDC: 18841-660-63, Lot: [id number] , Expiration date: 04/11/2020   Route: Intravitreal, Site: Right Eye, Waste: 0 mL  Post-op Post injection exam found visual acuity of at least counting fingers. The patient tolerated the procedure well. There were no complications. The patient received written and verbal post procedure care education.        Intravitreal Injection, Pharmacologic Agent - OS - Left Eye       Time Out 02/06/2020. 5:02 PM. Confirmed correct patient, procedure, site, and patient consented.   Anesthesia Topical anesthesia was used. Anesthetic medications included Lidocaine 2%, Proparacaine 0.5%.   Procedure Preparation included 5% betadine to ocular surface, eyelid speculum. A supplied needle was used.   Injection:  1.25 mg Bevacizumab (AVASTIN) SOLN   NDC: 02/19/2020, Lot: [id number] , Expiration date: 03/28/2020   Route: Intravitreal, Site: Left Eye, Waste: 0 mL  Post-op Post injection exam found visual acuity of at least counting fingers. The patient tolerated the  procedure well. There were no complications. The patient received written and verbal post procedure care  education.                 ASSESSMENT/PLAN:    ICD-10-CM   1. 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 Retinal edema  H35.81 OCT, Retina - OU - Both Eyes  3. Essential hypertension  I10   4. Hypertensive retinopathy of both eyes  H35.033 Fluorescein Angiography Optos (Transit OS)  5. Combined forms of age-related cataract of both eyes  H25.813     1,2. Severe Non-proliferative diabetic retinopathy, OU  - The incidence, risk factors for progression, natural history and treatment options for diabetic retinopathy  were discussed with patient.    - The need for close monitoring of blood glucose, blood pressure, and serum lipids, avoiding cigarette or any type of tobacco, and the need for long term follow up was also discussed with patient.  - exam with scattered heme and edema OU  - FA (06.21.21) shows late-leaking MA, vascular perfusion defects, no NV OU  - OCT shows diabetic macular edema, OU   - The natural history, pathology, and characteristics of diabetic macular edema discussed with patient.  A generalized discussion of the major clinical trials concerning treatment of diabetic macular edema (ETDRS, DCT, SCORE, RISE / RIDE, and ongoing DRCR net studies) was completed.  This discussion included mention of the various approaches to treating diabetic macular edema (observation, laser photocoagulation, anti-VEGF injections with lucentis / Avastin / Eylea, steroid injections with Kenalog / Ozurdex, and intraocular surgery with vitrectomy).  The goal hemoglobin A1C of 6-7 was discussed, as well as importance of smoking cessation and hypertension control.  Need for ongoing treatment and monitoring  were specifically discussed with reference to chronic nature of diabetic macular edema.  - recommend IVA OU #1 today, 06.21.21  - pt wishes to proceed with injections  - RBA of procedure discussed, questions answered  - Avastin informed consent obtained, signed and scanned, 06.21.21   - see procedure note  - f/u in 4 wks, DFE, OCT, possible injections  3,4. Hypertensive retinopathy OU  - discussed importance of tight BP control  - monitor  5. Mixed cataract OU  - The symptoms of cataract, surgical options, and treatments and risks were discussed with patient.  - discussed diagnosis and progression  - not yet visually significant  - monitor for now  Ophthalmic Meds Ordered this visit:  Meds ordered this encounter  Medications  . Bevacizumab (AVASTIN) SOLN 1.25 mg  . Bevacizumab (AVASTIN) SOLN 1.25 mg      Return in about 4 weeks (around 03/05/2020) for f/u NPDR OU, DFE, OCT.  There are no Patient Instructions on file for this visit.   Explained the diagnoses, plan, and follow up with the patient and they expressed understanding.  Patient expressed understanding of the importance of proper follow up care.  This document serves as a record of services personally performed by Gardiner Sleeper, MD, PhD. It was created on their behalf by Leeann Must, [place], a certified ophthalmic assistant. The creation of this record is the provider's dictation and/or activities during the visit.    Electronically signed by: Caryl Pina  English, COA @TODAY @ 10:36 PM  Gardiner Sleeper, M.D., Ph.D. Diseases & Surgery of the Retina and [place]  I have reviewed the above documentation for accuracy and completeness, and I agree with the above. Gardiner Sleeper, M.D., Ph.D. 02/07/20 10:36 PM    Abbreviations: M myopia (nearsighted); A astigmatism; H hyperopia (farsighted); P presbyopia; Mrx spectacle prescription;  CTL contact lenses; OD right eye; OS left eye; OU both  eyes  XT exotropia; ET esotropia; PEK punctate epithelial keratitis; PEE punctate epithelial erosions; DES dry eye syndrome; MGD meibomian gland dysfunction; ATs artificial tears; PFAT's preservative free artificial tears; [place] nuclear sclerotic cataract; PSC posterior subcapsular cataract; ERM epi-retinal membrane; PVD posterior vitreous detachment; RD retinal detachment; DM diabetes mellitus; DR diabetic retinopathy; NPDR non-proliferative diabetic retinopathy; PDR proliferative diabetic retinopathy; CSME clinically significant macular edema; DME diabetic macular edema; dbh dot blot hemorrhages; CWS cotton wool spot; POAG primary open angle glaucoma; C/D cup-to-disc ratio; HVF humphrey visual field; GVF goldmann visual field; OCT optical coherence tomography; IOP intraocular pressure; BRVO Branch retinal vein occlusion; CRVO central retinal vein occlusion; CRAO central retinal artery occlusion; BRAO branch retinal artery occlusion; RT retinal tear; SB scleral buckle; PPV pars plana vitrectomy; VH Vitreous hemorrhage; PRP panretinal laser photocoagulation; IVK intravitreal kenalog; VMT vitreomacular traction; MH Macular hole;  NVD neovascularization of the disc; NVE neovascularization elsewhere; AREDS age related eye disease study; ARMD age related macular degeneration; POAG primary open angle glaucoma; EBMD epithelial/anterior basement membrane dystrophy; ACIOL anterior chamber intraocular lens; IOL intraocular lens; PCIOL posterior chamber intraocular lens; Phaco/IOL phacoemulsification with intraocular lens placement; [place] photorefractive keratectomy; LASIK laser assisted in situ keratomileusis; HTN hypertension; DM diabetes mellitus; COPD chronic obstructive pulmonary disease

## 2020-02-06 ENCOUNTER — Other Ambulatory Visit: Payer: Self-pay

## 2020-02-06 ENCOUNTER — Ambulatory Visit (INDEPENDENT_AMBULATORY_CARE_PROVIDER_SITE_OTHER): Payer: Medicare Other | Admitting: Ophthalmology

## 2020-02-06 DIAGNOSIS — H35033 Hypertensive retinopathy, bilateral: Secondary | ICD-10-CM | POA: Diagnosis not present

## 2020-02-06 DIAGNOSIS — I10 Essential (primary) hypertension: Secondary | ICD-10-CM

## 2020-02-06 DIAGNOSIS — E113413 Type 2 diabetes mellitus with severe nonproliferative diabetic retinopathy with macular edema, bilateral: Secondary | ICD-10-CM | POA: Diagnosis not present

## 2020-02-06 DIAGNOSIS — H3581 Retinal edema: Secondary | ICD-10-CM | POA: Diagnosis not present

## 2020-02-06 DIAGNOSIS — H25813 Combined forms of age-related cataract, bilateral: Secondary | ICD-10-CM

## 2020-02-07 ENCOUNTER — Encounter (INDEPENDENT_AMBULATORY_CARE_PROVIDER_SITE_OTHER): Payer: Self-pay | Admitting: Ophthalmology

## 2020-02-07 MED ORDER — BEVACIZUMAB CHEMO INJECTION 1.25MG/0.05ML SYRINGE FOR KALEIDOSCOPE
1.2500 mg | INTRAVITREAL | Status: AC | PRN
  Administered 2020-02-07: 1.25 mg via INTRAVITREAL

## 2020-02-08 ENCOUNTER — Emergency Department: Payer: Medicare Other

## 2020-02-08 ENCOUNTER — Other Ambulatory Visit: Payer: Self-pay

## 2020-02-08 ENCOUNTER — Inpatient Hospital Stay: Payer: Medicare Other

## 2020-02-08 ENCOUNTER — Inpatient Hospital Stay
Admit: 2020-02-08 | Discharge: 2020-02-08 | Disposition: A | Discharge status: Home / Self Care | Payer: Medicare Other | Attending: Family Medicine | Admitting: Family Medicine

## 2020-02-08 ENCOUNTER — Encounter: Payer: Self-pay | Admitting: Emergency Medicine

## 2020-02-08 ENCOUNTER — Inpatient Hospital Stay
Admission: EM | Admit: 2020-02-08 | Discharge: 2020-02-15 | DRG: 299 | Disposition: A | Discharge status: Home Health | Payer: Medicare Other | Attending: Internal Medicine | Admitting: Internal Medicine

## 2020-02-08 DIAGNOSIS — K5900 Constipation, unspecified: Secondary | ICD-10-CM | POA: Diagnosis present

## 2020-02-08 DIAGNOSIS — M1990 Unspecified osteoarthritis, unspecified site: Secondary | ICD-10-CM | POA: Diagnosis present

## 2020-02-08 DIAGNOSIS — N139 Obstructive and reflux uropathy, unspecified: Secondary | ICD-10-CM | POA: Diagnosis present

## 2020-02-08 DIAGNOSIS — I82402 Acute embolism and thrombosis of unspecified deep veins of left lower extremity: Secondary | ICD-10-CM

## 2020-02-08 DIAGNOSIS — E11649 Type 2 diabetes mellitus with hypoglycemia without coma: Secondary | ICD-10-CM | POA: Diagnosis not present

## 2020-02-08 DIAGNOSIS — H3589 Other specified retinal disorders: Secondary | ICD-10-CM | POA: Diagnosis present

## 2020-02-08 DIAGNOSIS — Z951 Presence of aortocoronary bypass graft: Secondary | ICD-10-CM | POA: Diagnosis not present

## 2020-02-08 DIAGNOSIS — Z7982 Long term (current) use of aspirin: Secondary | ICD-10-CM

## 2020-02-08 DIAGNOSIS — Z888 Allergy status to other drugs, medicaments and biological substances status: Secondary | ICD-10-CM

## 2020-02-08 DIAGNOSIS — B961 Klebsiella pneumoniae [K. pneumoniae] as the cause of diseases classified elsewhere: Secondary | ICD-10-CM | POA: Diagnosis present

## 2020-02-08 DIAGNOSIS — D696 Thrombocytopenia, unspecified: Secondary | ICD-10-CM | POA: Diagnosis present

## 2020-02-08 DIAGNOSIS — I872 Venous insufficiency (chronic) (peripheral): Secondary | ICD-10-CM | POA: Diagnosis present

## 2020-02-08 DIAGNOSIS — I82412 Acute embolism and thrombosis of left femoral vein: Principal | ICD-10-CM | POA: Diagnosis present

## 2020-02-08 DIAGNOSIS — N1832 Chronic kidney disease, stage 3b: Secondary | ICD-10-CM | POA: Diagnosis present

## 2020-02-08 DIAGNOSIS — D631 Anemia in chronic kidney disease: Secondary | ICD-10-CM | POA: Diagnosis present

## 2020-02-08 DIAGNOSIS — N170 Acute kidney failure with tubular necrosis: Secondary | ICD-10-CM | POA: Diagnosis present

## 2020-02-08 DIAGNOSIS — Z794 Long term (current) use of insulin: Secondary | ICD-10-CM

## 2020-02-08 DIAGNOSIS — E611 Iron deficiency: Secondary | ICD-10-CM | POA: Diagnosis present

## 2020-02-08 DIAGNOSIS — N3020 Other chronic cystitis without hematuria: Secondary | ICD-10-CM | POA: Diagnosis present

## 2020-02-08 DIAGNOSIS — N179 Acute kidney failure, unspecified: Secondary | ICD-10-CM

## 2020-02-08 DIAGNOSIS — I10 Essential (primary) hypertension: Secondary | ICD-10-CM

## 2020-02-08 DIAGNOSIS — I824Z2 Acute embolism and thrombosis of unspecified deep veins of left distal lower extremity: Secondary | ICD-10-CM

## 2020-02-08 DIAGNOSIS — J449 Chronic obstructive pulmonary disease, unspecified: Secondary | ICD-10-CM | POA: Diagnosis present

## 2020-02-08 DIAGNOSIS — E113493 Type 2 diabetes mellitus with severe nonproliferative diabetic retinopathy without macular edema, bilateral: Secondary | ICD-10-CM | POA: Diagnosis present

## 2020-02-08 DIAGNOSIS — Z91040 Latex allergy status: Secondary | ICD-10-CM

## 2020-02-08 DIAGNOSIS — I130 Hypertensive heart and chronic kidney disease with heart failure and stage 1 through stage 4 chronic kidney disease, or unspecified chronic kidney disease: Secondary | ICD-10-CM | POA: Diagnosis present

## 2020-02-08 DIAGNOSIS — Z9049 Acquired absence of other specified parts of digestive tract: Secondary | ICD-10-CM | POA: Diagnosis not present

## 2020-02-08 DIAGNOSIS — I5043 Acute on chronic combined systolic (congestive) and diastolic (congestive) heart failure: Secondary | ICD-10-CM | POA: Diagnosis present

## 2020-02-08 DIAGNOSIS — Z20822 Contact with and (suspected) exposure to covid-19: Secondary | ICD-10-CM | POA: Diagnosis present

## 2020-02-08 DIAGNOSIS — B952 Enterococcus as the cause of diseases classified elsewhere: Secondary | ICD-10-CM | POA: Diagnosis present

## 2020-02-08 DIAGNOSIS — M79606 Pain in leg, unspecified: Secondary | ICD-10-CM | POA: Diagnosis present

## 2020-02-08 DIAGNOSIS — I82492 Acute embolism and thrombosis of other specified deep vein of left lower extremity: Secondary | ICD-10-CM | POA: Diagnosis present

## 2020-02-08 DIAGNOSIS — E1122 Type 2 diabetes mellitus with diabetic chronic kidney disease: Secondary | ICD-10-CM | POA: Diagnosis present

## 2020-02-08 DIAGNOSIS — I509 Heart failure, unspecified: Secondary | ICD-10-CM

## 2020-02-08 DIAGNOSIS — R0600 Dyspnea, unspecified: Secondary | ICD-10-CM

## 2020-02-08 DIAGNOSIS — I2510 Atherosclerotic heart disease of native coronary artery without angina pectoris: Secondary | ICD-10-CM | POA: Diagnosis present

## 2020-02-08 DIAGNOSIS — Z8249 Family history of ischemic heart disease and other diseases of the circulatory system: Secondary | ICD-10-CM

## 2020-02-08 DIAGNOSIS — R109 Unspecified abdominal pain: Secondary | ICD-10-CM

## 2020-02-08 DIAGNOSIS — R0902 Hypoxemia: Secondary | ICD-10-CM

## 2020-02-08 DIAGNOSIS — Z79899 Other long term (current) drug therapy: Secondary | ICD-10-CM

## 2020-02-08 DIAGNOSIS — Z9109 Other allergy status, other than to drugs and biological substances: Secondary | ICD-10-CM

## 2020-02-08 DIAGNOSIS — Z841 Family history of disorders of kidney and ureter: Secondary | ICD-10-CM

## 2020-02-08 DIAGNOSIS — N189 Chronic kidney disease, unspecified: Secondary | ICD-10-CM | POA: Diagnosis present

## 2020-02-08 DIAGNOSIS — Z833 Family history of diabetes mellitus: Secondary | ICD-10-CM

## 2020-02-08 LAB — CBC WITH DIFFERENTIAL/PLATELET
Abs Immature Granulocytes: 0.02 10*3/uL (ref 0.00–0.07)
Basophils Absolute: 0 10*3/uL (ref 0.0–0.1)
Basophils Relative: 1 %
Eosinophils Absolute: 0.1 10*3/uL (ref 0.0–0.5)
Eosinophils Relative: 2 %
HCT: 28.4 % — ABNORMAL LOW (ref 39.0–52.0)
Hemoglobin: 9.9 g/dL — ABNORMAL LOW (ref 13.0–17.0)
Immature Granulocytes: 0 %
Lymphocytes Relative: 15 %
Lymphs Abs: 0.8 10*3/uL (ref 0.7–4.0)
MCH: 32.2 pg (ref 26.0–34.0)
MCHC: 34.9 g/dL (ref 30.0–36.0)
MCV: 92.5 fL (ref 80.0–100.0)
Monocytes Absolute: 0.6 10*3/uL (ref 0.1–1.0)
Monocytes Relative: 12 %
Neutro Abs: 3.7 10*3/uL (ref 1.7–7.7)
Neutrophils Relative %: 70 %
Platelets: 121 10*3/uL — ABNORMAL LOW (ref 150–400)
RBC: 3.07 MIL/uL — ABNORMAL LOW (ref 4.22–5.81)
RDW: 12.7 % (ref 11.5–15.5)
WBC: 5.2 10*3/uL (ref 4.0–10.5)
nRBC: 0 % (ref 0.0–0.2)

## 2020-02-08 LAB — HEPARIN LEVEL (UNFRACTIONATED)
Heparin Unfractionated: 1.82 IU/mL — ABNORMAL HIGH (ref 0.30–0.70)
Heparin Unfractionated: 0.75 IU/mL — ABNORMAL HIGH (ref 0.30–0.70)

## 2020-02-08 LAB — BASIC METABOLIC PANEL
Anion gap: 9 (ref 5–15)
BUN: 48 mg/dL — ABNORMAL HIGH (ref 8–23)
CO2: 28 mmol/L (ref 22–32)
Calcium: 9.1 mg/dL (ref 8.9–10.3)
Chloride: 103 mmol/L (ref 98–111)
Creatinine, Ser: 2.23 mg/dL — ABNORMAL HIGH (ref 0.61–1.24)
GFR calc Af Amer: 32 mL/min — ABNORMAL LOW (ref >60)
GFR calc non Af Amer: 28 mL/min — ABNORMAL LOW (ref >60)
Glucose, Bld: 158 mg/dL — ABNORMAL HIGH (ref 70–99)
Potassium: 4 mmol/L (ref 3.5–5.1)
Sodium: 140 mmol/L (ref 135–145)

## 2020-02-08 LAB — GLUCOSE, CAPILLARY
Glucose-Capillary: 146 mg/dL — ABNORMAL HIGH (ref 70–99)
Glucose-Capillary: 101 mg/dL — ABNORMAL HIGH (ref 70–99)
Glucose-Capillary: 95 mg/dL (ref 70–99)
Glucose-Capillary: 86 mg/dL (ref 70–99)
Glucose-Capillary: 200 mg/dL — ABNORMAL HIGH (ref 70–99)
Glucose-Capillary: 43 mg/dL — CL (ref 70–99)
Glucose-Capillary: 72 mg/dL (ref 70–99)

## 2020-02-08 LAB — APTT
aPTT: 33 seconds (ref 24–36)
aPTT: 126 seconds — ABNORMAL HIGH (ref 24–36)

## 2020-02-08 LAB — PROTIME-INR
INR: 1.1 (ref 0.8–1.2)
Prothrombin Time: 13.7 seconds (ref 11.4–15.2)

## 2020-02-08 LAB — SARS CORONAVIRUS 2 BY RT PCR (HOSPITAL ORDER, PERFORMED IN ~~LOC~~ HOSPITAL LAB): SARS Coronavirus 2: NEGATIVE

## 2020-02-08 LAB — ECHOCARDIOGRAM COMPLETE
Height: 73 in
Weight: 2880 oz

## 2020-02-08 MED ORDER — GLIPIZIDE 10 MG PO TABS
10.0000 mg | ORAL_TABLET | Freq: Two times a day (BID) | ORAL | Status: DC
  Filled 2020-02-08 (×3): qty 1

## 2020-02-08 MED ORDER — ACETAMINOPHEN 650 MG RE SUPP: 650.0000 mg | Freq: Four times a day (QID) | RECTAL | Status: DC | PRN

## 2020-02-08 MED ORDER — DEXTROSE 50 % IV SOLN
INTRAVENOUS | Status: AC
  Filled 2020-02-08: qty 50

## 2020-02-08 MED ORDER — CARVEDILOL 3.125 MG PO TABS
6.2500 mg | ORAL_TABLET | Freq: Two times a day (BID) | ORAL | Status: DC
  Administered 2020-02-08 – 2020-02-15 (×14): 6.25 mg via ORAL
  Filled 2020-02-08 (×4): qty 2
  Filled 2020-02-08: qty 1
  Filled 2020-02-08 (×9): qty 2

## 2020-02-08 MED ORDER — TRAZODONE HCL 50 MG PO TABS
25.0000 mg | ORAL_TABLET | Freq: Every evening | ORAL | Status: DC | PRN
  Administered 2020-02-12 (×2): 25 mg via ORAL
  Filled 2020-02-08 (×3): qty 1

## 2020-02-08 MED ORDER — ACETAMINOPHEN 325 MG PO TABS: 650.0000 mg | ORAL_TABLET | Freq: Four times a day (QID) | ORAL | Status: DC | PRN

## 2020-02-08 MED ORDER — ONDANSETRON HCL 4 MG PO TABS: 4.0000 mg | ORAL_TABLET | Freq: Four times a day (QID) | ORAL | Status: DC | PRN

## 2020-02-08 MED ORDER — INSULIN ASPART 100 UNIT/ML ~~LOC~~ SOLN
0.0000 [IU] | Freq: Three times a day (TID) | SUBCUTANEOUS | Status: DC
  Administered 2020-02-08: 18:00:00 3 [IU] via SUBCUTANEOUS
  Filled 2020-02-08: qty 1

## 2020-02-08 MED ORDER — ACETAMINOPHEN 500 MG PO TABS
1000.0000 mg | ORAL_TABLET | Freq: Three times a day (TID) | ORAL | Status: DC
  Administered 2020-02-08 – 2020-02-09 (×4): 1000 mg via ORAL
  Filled 2020-02-08 (×5): qty 2

## 2020-02-08 MED ORDER — HEPARIN (PORCINE) 25000 UT/250ML-% IV SOLN
1200.0000 [IU]/h | INTRAVENOUS | Status: DC
  Administered 2020-02-08: 21:00:00 1200 [IU]/h via INTRAVENOUS
  Administered 2020-02-08: 1400 [IU]/h via INTRAVENOUS
  Filled 2020-02-08 (×2): qty 250

## 2020-02-08 MED ORDER — ASPIRIN EC 81 MG PO TBEC
81.0000 mg | DELAYED_RELEASE_TABLET | Freq: Every day | ORAL | Status: DC
  Administered 2020-02-08 – 2020-02-15 (×8): 81 mg via ORAL
  Filled 2020-02-08 (×8): qty 1

## 2020-02-08 MED ORDER — CYCLOBENZAPRINE HCL 10 MG PO TABS
10.0000 mg | ORAL_TABLET | Freq: Three times a day (TID) | ORAL | Status: DC
  Administered 2020-02-08 – 2020-02-09 (×4): 10 mg via ORAL
  Filled 2020-02-08 (×5): qty 1

## 2020-02-08 MED ORDER — LOSARTAN POTASSIUM 50 MG PO TABS: 100.0000 mg | ORAL_TABLET | Freq: Every day | ORAL | Status: DC

## 2020-02-08 MED ORDER — MORPHINE SULFATE 15 MG PO TABS
15.0000 mg | ORAL_TABLET | ORAL | Status: DC | PRN
  Filled 2020-02-08: qty 1

## 2020-02-08 MED ORDER — MAGNESIUM HYDROXIDE 400 MG/5ML PO SUSP
30.0000 mL | Freq: Every day | ORAL | Status: DC | PRN
  Filled 2020-02-08: qty 30

## 2020-02-08 MED ORDER — SODIUM CHLORIDE 0.9 % IV BOLUS
500.0000 mL | Freq: Once | INTRAVENOUS | Status: AC
  Administered 2020-02-08: 500 mL via INTRAVENOUS

## 2020-02-08 MED ORDER — ONDANSETRON HCL 4 MG/2ML IJ SOLN
4.0000 mg | Freq: Four times a day (QID) | INTRAMUSCULAR | Status: DC | PRN
  Administered 2020-02-09 – 2020-02-10 (×2): 4 mg via INTRAVENOUS
  Filled 2020-02-08 (×2): qty 2

## 2020-02-08 MED ORDER — SODIUM CHLORIDE 0.9 % IV SOLN: INTRAVENOUS | Status: DC

## 2020-02-08 MED ORDER — MORPHINE SULFATE (PF) 4 MG/ML IV SOLN
4.0000 mg | Freq: Once | INTRAVENOUS | Status: AC
  Administered 2020-02-08: 4 mg via INTRAVENOUS
  Filled 2020-02-08: qty 1

## 2020-02-08 MED ORDER — MORPHINE SULFATE (PF) 2 MG/ML IV SOLN
1.0000 mg | INTRAVENOUS | Status: DC | PRN
  Administered 2020-02-08: 2 mg via INTRAVENOUS
  Filled 2020-02-08: qty 1

## 2020-02-08 MED ORDER — ISOSORBIDE MONONITRATE ER 30 MG PO TB24
30.0000 mg | ORAL_TABLET | Freq: Every day | ORAL | Status: DC
  Administered 2020-02-08 – 2020-02-15 (×8): 30 mg via ORAL
  Filled 2020-02-08 (×8): qty 1

## 2020-02-08 MED ORDER — ONDANSETRON HCL 4 MG/2ML IJ SOLN
4.0000 mg | INTRAMUSCULAR | Status: AC
  Administered 2020-02-08: 4 mg via INTRAVENOUS
  Filled 2020-02-08: qty 2

## 2020-02-08 MED ORDER — TORSEMIDE 20 MG PO TABS
20.0000 mg | ORAL_TABLET | Freq: Two times a day (BID) | ORAL | Status: DC
  Administered 2020-02-08 (×2): 20 mg via ORAL
  Filled 2020-02-08 (×4): qty 1

## 2020-02-08 MED ORDER — AMLODIPINE BESYLATE 10 MG PO TABS
10.0000 mg | ORAL_TABLET | Freq: Every day | ORAL | Status: DC
  Administered 2020-02-08 – 2020-02-15 (×8): 10 mg via ORAL
  Filled 2020-02-08 (×6): qty 1
  Filled 2020-02-08: qty 2
  Filled 2020-02-08: qty 1

## 2020-02-08 MED ORDER — NITROGLYCERIN 0.4 MG SL SUBL: 0.4000 mg | SUBLINGUAL_TABLET | SUBLINGUAL | Status: DC | PRN

## 2020-02-08 MED ORDER — INSULIN ASPART PROT & ASPART (70-30 MIX) 100 UNIT/ML ~~LOC~~ SUSP
10.0000 [IU] | Freq: Two times a day (BID) | SUBCUTANEOUS | Status: DC
  Administered 2020-02-08: 10 [IU] via SUBCUTANEOUS
  Filled 2020-02-08 (×2): qty 10

## 2020-02-08 MED ORDER — HEPARIN BOLUS VIA INFUSION
5000.0000 [IU] | Freq: Once | INTRAVENOUS | Status: AC
  Administered 2020-02-08: 5000 [IU] via INTRAVENOUS
  Filled 2020-02-08: qty 5000

## 2020-02-08 NOTE — Progress Notes (Signed)
*  PRELIMINARY RESULTS* Echocardiogram 2D Echocardiogram has been performed.  Rick Zuniga 02/08/2020, 10:54 AM

## 2020-02-08 NOTE — ED Notes (Signed)
Pt awaiting breakfast.

## 2020-02-08 NOTE — H&P (Signed)
at [place] NAME: Rick Zuniga    MR#:  [id number]  DATE OF BIRTH:  [birth year]  DATE OF ADMISSION:  02/08/2020  PRIMARY CARE PHYSICIAN: [name], MD   REQUESTING/REFERRING PHYSICIAN: Hinda Kehr, MD  CHIEF COMPLAINT:   Chief Complaint  Patient presents with  . Leg Swelling  . Leg Pain    HISTORY OF PRESENT ILLNESS:  Rick Zuniga  is a 74 y.o. Caucasian woman male with a known history of type 2 diabetes mellitus, hypertension, coronary artery disease, COPD and osteoarthritis, presented to the emergency room with acute onset of bilateral lower extremity pain and swelling which have been worsening over the last 5 days.  Prior to that the patient had an eye procedure that required IV contrast.  He had no recent long travels or other surgeries.  He stated that he stayed in a recliner all day yesterday.  He usually has good pain intolerance and was shouting in pain per his wife.  He has chronic diarrhea but denied any nausea or vomiting or abdominal pain.  No dysuria, oliguria or hematuria or flank pain.  He has been having chills and recent cold intolerance.  He admitted to recent dyspnea on exertion with orthopnea and paroxysmal nocturnal dyspnea with no chest pain or palpitations.  Upon presentation to the emergency room, vital signs were within normal.  Labs revealed anemia with hemoglobin of 9.9 hematocrit 28.4 close to previous levels and thrombocytopenia of 121.  Left lower extremity DVT showed nonocclusive thrombus in the left common femoral vein, saphenofemoral junction and saphenous vein and uncertain acuity and no DVT in the femoral or popliteal vein.  Right lower extremity venous Dopplers is negative for DVT.  The patient was given IV heparin bolus and drip, 4 mg of IV morphine sulfate and 4 mg of IV Zofran as well as 500 mL IV normal saline bolus.  He will be admitted to a medically monitored bed for further evaluation and  management. PAST MEDICAL HISTORY:   Past Medical History:  Diagnosis Date  . Arthritis   . COPD (chronic obstructive pulmonary disease) ([place])   . Diabetes ([place])   . Heart disease   . Hypertension   . Indigestion   . Thrombocytopenia ([place])   . Wears dentures    full upper and lower    PAST SURGICAL HISTORY:   Past Surgical History:  Procedure Laterality Date  . APPENDECTOMY    . CARDIAC SURGERY     bypass  . CHOLECYSTECTOMY    . COLONOSCOPY  12/23/2010    SOCIAL HISTORY:   Social History   Tobacco Use  . Smoking status: Never Smoker  . Smokeless tobacco: Never Used  Substance Use Topics  . Alcohol use: No    FAMILY HISTORY:   Family History  Problem Relation Age of Onset  . CAD Mother   . Kidney failure Father   . Diabetes Father     DRUG ALLERGIES:   Allergies  Allergen Reactions  . Pioglitazone Swelling  . Metformin And Related Diarrhea  . Atorvastatin Rash  . Benazepril Rash  . Latex Rash    (tape only)  . Tape Rash    REVIEW OF SYSTEMS:   ROS As per history of present illness. All pertinent systems were reviewed above. Constitutional,  HEENT, cardiovascular, respiratory, GI, GU, musculoskeletal, neuro, psychiatric, endocrine,  integumentary and hematologic systems were reviewed and are otherwise  negative/unremarkable except for positive findings mentioned  above in the HPI.   MEDICATIONS AT HOME:   Prior to Admission medications   Medication Sig Start Date End Date Taking? Authorizing Provider  amLODipine (NORVASC) 10 MG tablet Take 1 tablet (10 mg total) by mouth daily. 05/26/19 02/08/20 Yes Vaughan Basta, MD  aspirin EC 81 MG tablet Take 81 mg by mouth daily.    Yes [provider]  carvedilol (COREG) 6.25 MG tablet Take 6.25 mg by mouth 2 (two) times daily with a meal.   Yes [provider]  glipiZIDE (GLUCOTROL) 10 MG tablet Take 10 mg by mouth 2 (two) times daily.    Yes [provider]  insulin  NPH-regular Human (NOVOLIN 70/30) (70-30) 100 UNIT/ML injection Inject 10 Units into the skin 2 (two) times daily.    Yes [provider]  isosorbide mononitrate (IMDUR) 30 MG 24 hr tablet Take 30 mg by mouth daily. 08/19/19  Yes [provider]  losartan (COZAAR) 100 MG tablet Take 100 mg by mouth daily.   Yes [provider]  nitroGLYCERIN (NITROSTAT) 0.4 MG SL tablet Place 0.4 mg under the tongue every 5 (five) minutes as needed for chest pain.   Yes [provider]  torsemide (DEMADEX) 20 MG tablet Take 20 mg by mouth 2 (two) times daily.  12/05/19  Yes [provider]      VITAL SIGNS:  Blood pressure (!) 145/77, pulse 81, temperature 98 F (36.7 C), temperature source Oral, resp. rate 18, height 6\' 1"  (1.854 m), weight 81.6 kg, SpO2 96 %.  PHYSICAL EXAMINATION:  Physical Exam  GENERAL:  74 y.o.-year-old Caucasian male patient lying in the bed with mild respiratory distress with conversational dyspnea. EYES: Pupils equal, round, reactive to light and accommodation. No scleral icterus. Extraocular muscles intact.  HEENT: Head atraumatic, normocephalic. Oropharynx and nasopharynx clear.  NECK:  Supple, no jugular venous distention. No thyroid enlargement, no tenderness.  LUNGS: Minimally diminished bibasal breath sounds. CARDIOVASCULAR: Regular rate and rhythm, S1, S2 normal. No murmurs, rubs, or gallops.  ABDOMEN: Soft, nondistended, nontender. Bowel sounds present. No organomegaly or mass.  EXTREMITIES: Bilateral 3+ bilateral lower extremity pitting edema, left more than right.  Positive Homans sign on the left leg.  No cyanosis, or clubbing.  NEUROLOGIC: Cranial nerves II through XII are intact. Muscle strength 5/5 in all extremities. Sensation intact. Gait not checked.  PSYCHIATRIC: The patient is alert and oriented x 3.  Normal affect and good eye contact. SKIN: No obvious rash, lesion, or ulcer.   LABORATORY PANEL:   CBC Recent Labs    Lab 02/08/20 0209  WBC 5.2  HGB 9.9*  HCT 28.4*  PLT 121*   ------------------------------------------------------------------------------------------------------------------  Chemistries  Recent Labs  Lab 02/08/20 0209  NA 140  K 4.0  CL 103  CO2 28  GLUCOSE 158*  BUN 48*  CREATININE 2.23*  CALCIUM 9.1   ------------------------------------------------------------------------------------------------------------------  Cardiac Enzymes No results for input(s): TROPONINI in the last 168 hours. ------------------------------------------------------------------------------------------------------------------  RADIOLOGY:  US Venous Img Lower Unilateral Left  Result Date: 02/08/2020 CLINICAL DATA:  Left swelling and pain for 2 days. EXAM: LEFT LOWER EXTREMITY VENOUS DOPPLER ULTRASOUND TECHNIQUE: Gray-scale sonography with compression, as well as color and duplex ultrasound, were performed to evaluate the deep venous system(s) from the level of the common femoral vein through the popliteal and proximal calf veins. COMPARISON:  None. FINDINGS: VENOUS There is nonocclusive thrombus within the common femoral vein and at the saphenofemoral junction. Thrombus appears eccentric. Thrombus extends to involve  the saphenous vein. Normal compressibility of the superficial femoral and popliteal veins, as well as the visualized calf veins. Visualized portions of profunda femoral vein and great saphenous vein unremarkable. No filling defects to suggest DVT on grayscale or color Doppler imaging. Doppler waveforms show normal direction of venous flow, normal respiratory plasticity and response to augmentation. Limited views of the contralateral common femoral vein are unremarkable. OTHER Soft tissue edema in the calf. Limitations: None IMPRESSION: 1. Positive for nonocclusive thrombus in the left common femoral vein, saphenofemoral junction and saphenous vein. Acuity is uncertain as this appears eccentric,  however no prior exams are available for comparison. 2. No DVT in the femoral or popliteal veins. These results were called by telephone at the time of interpretation on 02/08/2020 at 3:45 am to Dr Marjean [name] , who verbally acknowledged these results. Electronically Signed   By: Keith Rake M.D.   On: 02/08/2020 03:46   Intravitreal Injection, Pharmacologic Agent - OD - Right Eye  Result Date: 02/07/2020 Time Out 02/06/2020. 5:01 PM. Confirmed correct patient, procedure, site, and patient consented. Anesthesia Topical anesthesia was used. Anesthetic medications included Lidocaine 2%, Proparacaine 0.5%. Procedure Preparation included 5% betadine to ocular surface, eyelid speculum. A supplied needle was used. Injection: 1.25 mg Bevacizumab (AVASTIN) SOLN   NDC: 99357-017-79, Lot: [id number] , Expiration date: 04/11/2020   Route: Intravitreal, Site: Right Eye, Waste: 0 mL Post-op Post injection exam found visual acuity of at least counting fingers. The patient tolerated the procedure well. There were no complications. The patient received written and verbal post procedure care education.   Intravitreal Injection, Pharmacologic Agent - OS - Left Eye  Result Date: 02/07/2020 Time Out 02/06/2020. 5:02 PM. Confirmed correct patient, procedure, site, and patient consented. Anesthesia Topical anesthesia was used. Anesthetic medications included Lidocaine 2%, Proparacaine 0.5%. Procedure Preparation included 5% betadine to ocular surface, eyelid speculum. A supplied needle was used. Injection: 1.25 mg Bevacizumab (AVASTIN) SOLN   NDC: 02/19/2020, Lot: [id number] , Expiration date: 03/28/2020   Route: Intravitreal, Site: Left Eye, Waste: 0 mL Post-op Post injection exam found visual acuity of at least counting fingers. The patient tolerated the procedure well. There were no complications. The patient received written and verbal post procedure care education.   OCT, Retina - OU - Both Eyes  Result Date:  02/07/2020 Right Eye Quality was good. Central Foveal Thickness: 573. Progression has no prior data. Findings include abnormal foveal contour, intraretinal fluid, subretinal fluid. Left Eye Quality was good. Central Foveal Thickness: 506. Progression has no prior data. Findings include abnormal foveal contour, intraretinal fluid, subretinal fluid. Notes *Images captured and stored on drive Diagnosis / Impression: +DME OU Clinical management: See below Abbreviations: NFP - Normal foveal profile. CME - cystoid macular edema. PED - pigment epithelial detachment. IRF - intraretinal fluid. SRF - subretinal fluid. EZ - ellipsoid zone. ERM - epiretinal membrane. ORA - outer retinal atrophy. ORT - outer retinal tubulation. SRHM - subretinal hyper-reflective material  Fluorescein Angiography Optos (Transit OS)  Result Date: 02/07/2020 Right Eye Progression has no prior data. Early phase findings include microaneurysm, vascular perfusion defect. Mid/Late phase findings include microaneurysm, vascular perfusion defect, leakage (No NV). Left Eye Progression has no prior data. Early phase findings include delayed filling, blockage, microaneurysm, vascular perfusion defect (Delayed venous return). Mid/Late phase findings include blockage, microaneurysm, vascular perfusion defect, leakage (No NV). 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  Acute kidney injury. -The patient will be admitted to a medically monitored bed. -He will be hydrated with IV normal saline. -We will follow his BMPs. -We will hold off nephrotoxins  2.  Left lower extremity DVT. -We will continue IV heparin. -This can be later switched to p.o. Eliquis.  3.  Dyspnea on exertion and orthopnea with bilateral lower extremity edema. -This is concerning for new onset CHF that could be contributing to his recent dyspnea on exertion as  well. -We will obtain a chest x-ray and a 2D echo for further assessment. -If he does have interstitial pulmonary edema we will have to hold off IV fluids and diurese him as his acute kidney injury could then be related to hypoperfusion from acute CHF.  4.  Hypertension. -Continue amlodipine, Coreg and hold off Cozaar given acute kidney injury.  5.  Type 2 diabetes mellitus. -The patient will be placed on supplement coverage with NovoLog and will continue his basal coverage and Glucotrol.  6.  Coronary artery disease. -We will continue his Imdur and Coreg.  7.  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 -We will hold off diuretic therapy given acute kidney injury and follow BMP.  8.  DVT prophylaxis. -Patient will be on IV heparin.  All the records are reviewed and case discussed with ED provider. The plan of care was discussed in details with the patient and his wife I answered all questions. The patient agreed to proceed with the above mentioned plan. Further management will depend upon hospital course.   CODE STATUS: Full code  Status is: Inpatient  Remains inpatient appropriate because:Ongoing active pain requiring inpatient pain management, Ongoing diagnostic testing needed not appropriate for outpatient work up, Unsafe d/c plan, IV treatments appropriate due to intensity of illness or inability to take PO and Inpatient level of care appropriate due to severity of illness   Dispo: The patient is from: Home              Anticipated d/c is to: Home              Anticipated d/c date is: 2 days              Patient currently is not medically stable to d/c.   TOTAL TIME TAKING CARE OF THIS PATIENT: 55 minutes.    Christel Mormon M.D on 02/08/2020 at 5:50 AM  [facility]   From 7 PM-7 AM, contact night-coverage [email or web address]  CC: Primary care physician; [name], MD   Note: This dictation was prepared with Dragon dictation along with  smaller phrase technology. Any transcriptional typo errors that result from this process are unintentional.

## 2020-02-08 NOTE — ED Provider Notes (Signed)
[facility] Emergency Department Provider Note  ____________________________________________   First MD Initiated Contact with Patient 02/08/20 [phone]     (approximate)  I have reviewed the triage vital signs and the nursing notes.   HISTORY  Chief Complaint Leg Swelling and Leg Pain    HPI Rick Zuniga is a 74 y.o. male with medical history as listed below who presents for evaluation of gradually worsening pain and swelling in both of his legs over the last 5 days.  The onset was acute after he had an ophthalmological procedure which required IV contrast.  He and his wife or fianc (at bedside) state that since that procedure, his urine has been an unusual color and they are worried about his kidneys.  Additionally his pulmonologist, Dr. Lanney Gins, reportedly told them that he was worried about the patient's kidneys and they were in the process of getting a referral to nephrology.  The patient also sees Dr. Saralyn Pilar with cardiology.  The pain has become severe in his legs with any amount of weightbearing or movement, left greater than right.  He has no history of blood clots and has had no recent immobilizations, long trips, surgeries, etc.  He has had no trauma.  He denies fever, sore throat, chest pain, shortness of breath, nausea, vomiting, and abdominal pain.  He is eating and drinking normally and has no other new medications of which they are aware.         Past Medical History:  Diagnosis Date  . Arthritis   . COPD (chronic obstructive pulmonary disease) ([place])   . Diabetes ([place])   . Heart disease   . Hypertension   . Indigestion   . Thrombocytopenia ([place])   . Wears dentures    full upper and lower    Patient Active Problem List   Diagnosis Date Noted  . AKI (acute kidney injury) ([place]) 02/08/2020  . 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 Nocturnal leg cramps 12/21/2019  . Pleural effusion 12/08/2019  . Shortness of breath  12/08/2019  . Chest pain 05/25/2019  . Esophageal dysphagia 05/09/2019  . Functional diarrhea 05/09/2019  . RUQ pain 05/09/2019  . Other male erectile dysfunction 11/11/2018  . Lumbar radiculitis 10/17/2018  . Pedal edema 12/14/2017  . Vertical diplopia 11/02/2017  . Accelerated hypertension 10/27/2017  . Thrombocytopenia ([place]) 02/28/2016  . Anemia 02/28/2016  . Hyperlipidemia 10/23/2014  . CAD (coronary artery disease) 02/22/2014  . Hypertension associated with diabetes ([place]) 02/22/2014  . Type 2 diabetes mellitus with microalbuminuria, with long-term current use of insulin ([place]) 02/22/2014    Past Surgical History:  Procedure Laterality Date  . APPENDECTOMY    . CARDIAC SURGERY     bypass  . CHOLECYSTECTOMY    . COLONOSCOPY  12/23/2010    Prior to Admission medications   Medication Sig Start Date End Date Taking? Authorizing Provider  amLODipine (NORVASC) 10 MG tablet Take 1 tablet (10 mg total) by mouth daily. 05/26/19 02/08/20 Yes Vaughan Basta, MD  aspirin EC 81 MG tablet Take 81 mg by mouth daily.    Yes [provider]  carvedilol (COREG) 6.25 MG tablet Take 6.25 mg by mouth 2 (two) times daily with a meal.   Yes [provider]  glipiZIDE (GLUCOTROL) 10 MG tablet Take 10 mg by mouth 2 (two) times daily.    Yes [provider]  insulin NPH-regular Human (NOVOLIN 70/30) (70-30) 100 UNIT/ML injection Inject 10 Units into the skin 2 (two) times daily.  Yes [provider]  isosorbide mononitrate (IMDUR) 30 MG 24 hr tablet Take 30 mg by mouth daily. 08/19/19  Yes [provider]  losartan (COZAAR) 100 MG tablet Take 100 mg by mouth daily.   Yes [provider]  nitroGLYCERIN (NITROSTAT) 0.4 MG SL tablet Place 0.4 mg under the tongue every 5 (five) minutes as needed for chest pain.   Yes [provider]  torsemide (DEMADEX) 20 MG tablet Take 20 mg by mouth 2 (two) times daily.  12/05/19  Yes [provider]    Allergies Pioglitazone, Metformin and related, Atorvastatin, Benazepril, Latex, and Tape  Family History  Problem Relation Age of Onset  . CAD Mother   . Kidney failure Father   . Diabetes Father     Social History Social History   Tobacco Use  . Smoking status: Never Smoker  . Smokeless tobacco: Never Used  Vaping Use  . Vaping Use: Never used  Substance Use Topics  . Alcohol use: No  . Drug use: No    Review of Systems Constitutional: No fever/chills Eyes: No visual changes. ENT: No sore throat. Cardiovascular: Denies chest pain. Respiratory: Denies shortness of breath. Gastrointestinal: No abdominal pain.  No nausea, no vomiting.  No diarrhea.  No constipation. Genitourinary: Abnormal urine color since ophthalmological procedure requiring IV contrast.  Negative for dysuria. Musculoskeletal: Pain and swelling in bilateral lower extremities, worsening for the last 5 days. Integumentary: Negative for rash. Neurological: Negative for headaches, focal weakness or numbness.   ____________________________________________   PHYSICAL EXAM:  VITAL SIGNS: ED Triage Vitals  Enc Vitals Group     BP 02/08/20 0200 135/71     Pulse Rate 02/08/20 0200 78     Resp 02/08/20 0200 20     Temp 02/08/20 0200 98 F (36.7 C)     Temp Source 02/08/20 0200 Oral     SpO2 02/08/20 0200 98 %     Weight 02/08/20 0212 81.6 kg (180 lb)     Height 02/08/20 0212 1.854 m (6\' 1" )     Head Circumference --      Peak Flow --      Pain Score 02/08/20 0211 10     Pain Loc --      Pain Edu? --      Excl. in [place]? --     Constitutional: Alert and oriented.  Eyes: Conjunctivae are normal.  Head: Atraumatic. Nose: No congestion/rhinnorhea. Mouth/Throat: Patient is wearing a mask. Neck: No stridor.  No meningeal signs.   Cardiovascular: Normal rate, regular rhythm. Good peripheral circulation. Grossly normal heart sounds. Respiratory: Normal respiratory effort.  No  retractions. Gastrointestinal: Soft and nontender. No distention.  Musculoskeletal: 1+ pitting edema in bilateral lower extremities, worse in both feet and worse on the left side but also abnormal on the right.  Tender to manipulation of the legs.  Extremities are warm and perfused. Neurologic:  Normal speech and language. No gross focal neurologic deficits are appreciated.  Skin:  Skin is warm, dry and intact. Psychiatric: Mood and affect are normal. Speech and behavior are normal.  ____________________________________________   LABS (all labs ordered are listed, but only abnormal results are displayed)  Labs Reviewed  GLUCOSE, CAPILLARY - Abnormal; Notable for the following components:      Result Value   Glucose-Capillary 146 (*)    All other components within normal limits  BASIC METABOLIC PANEL - Abnormal; Notable for the following components:   Glucose, Bld 158 (*)  BUN 48 (*)    Creatinine, Ser 2.23 (*)    GFR calc non Af Amer 28 (*)    GFR calc Af Amer 32 (*)    All other components within normal limits  CBC WITH DIFFERENTIAL/PLATELET - Abnormal; Notable for the following components:   RBC 3.07 (*)    Hemoglobin 9.9 (*)    HCT 28.4 (*)    Platelets 121 (*)    All other components within normal limits  GLUCOSE, CAPILLARY - Abnormal; Notable for the following components:   Glucose-Capillary 101 (*)    All other components within normal limits  SARS CORONAVIRUS 2 BY RT PCR (HOSPITAL ORDER, [place] [facility])  PROTIME-INR  APTT  HEPARIN LEVEL (UNFRACTIONATED)  HEMOGLOBIN A1C   ____________________________________________  EKG  ED ECG REPORT I, Hinda Kehr, the attending physician, personally viewed and interpreted this ECG.  Date: 02/08/2020 EKG Time: 06:28 Rate: 86 Rhythm: normal sinus rhythm QRS Axis: normal Intervals: normal ST/T Wave abnormalities: Non-specific ST segment / T-wave changes, but no clear evidence of acute  ischemia. Narrative Interpretation: no definitive evidence of acute ischemia; does not meet STEMI criteria.   ____________________________________________  RADIOLOGY I, Hinda Kehr, personally viewed and evaluated these images (plain radiographs) as part of my medical decision making, as well as reviewing the written report by the radiologist.  ED MD interpretation: Nonocclusive thrombi in the left common femoral vein, saphenofemoral junction and saphenous vein. Normal right lower extremity ultrasound  Official radiology report(s): US Venous Img Lower Unilateral Left  Result Date: 02/08/2020 CLINICAL DATA:  Left swelling and pain for 2 days. EXAM: LEFT LOWER EXTREMITY VENOUS DOPPLER ULTRASOUND TECHNIQUE: Gray-scale sonography with compression, as well as color and duplex ultrasound, were performed to evaluate the deep venous system(s) from the level of the common femoral vein through the popliteal and proximal calf veins. COMPARISON:  None. FINDINGS: VENOUS There is nonocclusive thrombus within the common femoral vein and at the saphenofemoral junction. Thrombus appears eccentric. Thrombus extends to involve the saphenous vein. Normal compressibility of the superficial femoral and popliteal veins, as well as the visualized calf veins. Visualized portions of profunda femoral vein and great saphenous vein unremarkable. No filling defects to suggest DVT on grayscale or color Doppler imaging. Doppler waveforms show normal direction of venous flow, normal respiratory plasticity and response to augmentation. Limited views of the contralateral common femoral vein are unremarkable. OTHER Soft tissue edema in the calf. Limitations: None IMPRESSION: 1. Positive for nonocclusive thrombus in the left common femoral vein, saphenofemoral junction and saphenous vein. Acuity is uncertain as this appears eccentric, however no prior exams are available for comparison. 2. No DVT in the femoral or popliteal veins. These  results were called by telephone at the time of interpretation on 02/08/2020 at 3:45 am to Dr Marjean [name] , who verbally acknowledged these results. Electronically Signed   By: Keith Rake M.D.   On: 02/08/2020 03:46   US Venous Img Lower Unilateral Right  Result Date: 02/08/2020 CLINICAL DATA:  Pain and swelling in the legs. Left lower extremity DVT. EXAM: Right LOWER EXTREMITY VENOUS DOPPLER ULTRASOUND TECHNIQUE: Gray-scale sonography with compression, as well as color and duplex ultrasound, were performed to evaluate the deep venous system(s) from the level of the common femoral vein through the popliteal and proximal calf veins. COMPARISON:  Left lower extremity venous Doppler ultrasound 02/08/2020. FINDINGS: VENOUS Normal compressibility of the common femoral, superficial femoral, and popliteal veins, as well as the visualized calf veins.  Visualized portions of profunda femoral vein and great saphenous vein unremarkable. No filling defects to suggest DVT on grayscale or color Doppler imaging. Doppler waveforms show normal direction of venous flow, normal respiratory plasticity and response to augmentation. Limited views of the contralateral common femoral vein again demonstrate the previously diagnosis nonocclusive thrombus. OTHER None. Limitations: none IMPRESSION: Normal right lower extremity venous Doppler ultrasound. Electronically Signed   By: San Morelle M.D.   On: 02/08/2020 06:34   DG Chest Portable 1 View  Result Date: 02/08/2020 CLINICAL DATA:  Orthopnea. Left leg swelling and cramping for 2 days. Left lower extremity DVT EXAM: PORTABLE CHEST 1 VIEW COMPARISON:  One-view chest x-ray 05/25/2019 FINDINGS: Heart is enlarged. A new left pleural effusion is present. Associated left lower lobe airspace disease is present. Mild pulmonary vascular congestion is noted. No focal airspace disease is present on the right. IMPRESSION: New left pleural effusion with associated airspace  disease. This may represent atelectasis or infection. Pulmonary embolus is also considered. Recommend CTA chest for further evaluation. Electronically Signed   By: San Morelle M.D.   On: 02/08/2020 07:23   Intravitreal Injection, Pharmacologic Agent - OD - Right Eye  Result Date: 02/07/2020 Time Out 02/06/2020. 5:01 PM. Confirmed correct patient, procedure, site, and patient consented. Anesthesia Topical anesthesia was used. Anesthetic medications included Lidocaine 2%, Proparacaine 0.5%. Procedure Preparation included 5% betadine to ocular surface, eyelid speculum. A supplied needle was used. Injection: 1.25 mg Bevacizumab (AVASTIN) SOLN   NDC: 63149-702-63, Lot: [id number] , Expiration date: 04/11/2020   Route: Intravitreal, Site: Right Eye, Waste: 0 mL Post-op Post injection exam found visual acuity of at least counting fingers. The patient tolerated the procedure well. There were no complications. The patient received written and verbal post procedure care education.   Intravitreal Injection, Pharmacologic Agent - OS - Left Eye  Result Date: 02/07/2020 Time Out 02/06/2020. 5:02 PM. Confirmed correct patient, procedure, site, and patient consented. Anesthesia Topical anesthesia was used. Anesthetic medications included Lidocaine 2%, Proparacaine 0.5%. Procedure Preparation included 5% betadine to ocular surface, eyelid speculum. A supplied needle was used. Injection: 1.25 mg Bevacizumab (AVASTIN) SOLN   NDC: 02/19/2020, Lot: [id number] , Expiration date: 03/28/2020   Route: Intravitreal, Site: Left Eye, Waste: 0 mL Post-op Post injection exam found visual acuity of at least counting fingers. The patient tolerated the procedure well. There were no complications. The patient received written and verbal post procedure care education.    ____________________________________________   PROCEDURES   Procedure(s) performed (including Critical  Care):  Procedures   ____________________________________________   INITIAL IMPRESSION / MDM / ASSESSMENT AND PLAN / ED COURSE  As part of my medical decision making, I reviewed the following data within the [place] History obtained from family, Nursing notes reviewed and incorporated, Labs reviewed , EKG interpreted , Old chart reviewed, Discussed with admitting physician  and Notes from prior ED visits   Differential diagnosis includes, but is not limited to, DVT, acute renal failure or acute kidney injury, electrolyte or metabolic abnormality, trauma, infection.  The patient has what appear to be acute DVTs in at least 3 veins in his left leg which correlates clinically.  Imaging was only obtained on the left leg in triage I have ordered right leg venous Dopplers as well.  Additionally lab work is notable for acute renal failure with a creatinine greater than 2 when normally he is about at 1. He cannot bear weight on his legs and has pain in the right as  well as the left which we know has a DVT. I have ordered heparin for DVT treatment as well as a 500 mL normal saline IV bolus to begin treatment of his acute kidney injury/acute renal failure. Labs are otherwise reassuring. Patient and his wife understand and agree with the plan for admission and I will consult the hospitalist service.     Clinical Course as of Feb 07 845  Wed Feb 08, 2020  0523 Discussed case by phone with Dr. Sidney Ace with the hospitalist service.  He will admit.   [CF]    Clinical Course User Index [CF] Hinda Kehr, MD     ____________________________________________  FINAL CLINICAL IMPRESSION(S) / ED DIAGNOSES  Final diagnoses:  Acute renal failure, unspecified acute renal failure type ([place])  Acute deep vein thrombosis (DVT) of distal vein of left lower extremity ([place])     MEDICATIONS GIVEN DURING THIS VISIT:  Medications  heparin ADULT infusion 100 units/mL (25000 units/228mL  sodium chloride 0.45%) (1,400 Units/hr Intravenous Rate/Dose Verify 02/08/20 0710)  aspirin EC tablet 81 mg (has no administration in time range)  amLODipine (NORVASC) tablet 10 mg (has no administration in time range)  carvedilol (COREG) tablet 6.25 mg (has no administration in time range)  isosorbide mononitrate (IMDUR) 24 hr tablet 30 mg (has no administration in time range)  nitroGLYCERIN (NITROSTAT) SL tablet 0.4 mg (has no administration in time range)  torsemide (DEMADEX) tablet 20 mg (has no administration in time range)  glipiZIDE (GLUCOTROL) tablet 10 mg (has no administration in time range)  insulin aspart protamine- aspart (NOVOLOG MIX 70/30) injection 10 Units (has no administration in time range)  0.9 %  sodium chloride infusion ( Intravenous Rate/Dose Verify 02/08/20 0710)  acetaminophen (TYLENOL) tablet 650 mg (has no administration in time range)    Or  acetaminophen (TYLENOL) suppository 650 mg (has no administration in time range)  traZODone (DESYREL) tablet 25 mg (has no administration in time range)  magnesium hydroxide (MILK OF MAGNESIA) suspension 30 mL (has no administration in time range)  ondansetron (ZOFRAN) tablet 4 mg (has no administration in time range)    Or  ondansetron (ZOFRAN) injection 4 mg (has no administration in time range)  morphine 2 MG/ML injection 1-2 mg (has no administration in time range)  insulin aspart (novoLOG) injection 0-15 Units (0 Units Subcutaneous Not Given 02/08/20 0749)  sodium chloride 0.9 % bolus 500 mL (0 mLs Intravenous Stopped 02/08/20 0815)  morphine 4 MG/ML injection 4 mg (4 mg Intravenous Given 02/08/20 0505)  ondansetron (ZOFRAN) injection 4 mg (4 mg Intravenous Given 02/08/20 0504)  heparin bolus via infusion 5,000 Units (5,000 Units Intravenous Bolus from Bag 02/08/20 0532)     ED Discharge Orders    None      *Please note:  Rick Zuniga was evaluated in Emergency Department on 02/08/2020 for the symptoms described in  the history of present illness. He was evaluated in the context of the global COVID-19 pandemic, which necessitated consideration that the patient might be at risk for infection with the SARS-CoV-2 virus that causes COVID-19. Institutional protocols and algorithms that pertain to the evaluation of patients at risk for COVID-19 are in a state of rapid change based on information released by regulatory bodies including the CDC and federal and state organizations. These policies and algorithms were followed during the patient's care in the ED.  Some ED evaluations and interventions may be delayed as a result of limited staffing during and after the pandemic.*  Note:  This document was prepared using Dragon voice recognition software and may include unintentional dictation errors.   Hinda Kehr, MD 02/08/20 [phone]

## 2020-02-08 NOTE — ED Notes (Signed)
Pt given lunch tray by dietary staff. 

## 2020-02-08 NOTE — ED Notes (Signed)
Pt given breakfast.

## 2020-02-08 NOTE — ED Notes (Signed)
Pt ate 50% of his dinner tray, wife also brought him food.

## 2020-02-08 NOTE — Progress Notes (Signed)
ANTICOAGULATION CONSULT NOTE - Initial Consult  Pharmacy Consult for Heparin  Indication: DVT  Allergies  Allergen Reactions  . Pioglitazone Swelling  . Metformin And Related Diarrhea  . Atorvastatin Rash  . Benazepril Rash  . Latex Rash    (tape only)  . Tape Rash    Patient Measurements: Height: 6\' 1"  (185.4 cm) Weight: 81.6 kg (180 lb) IBW/kg (Calculated) : 79.9 Heparin Dosing Weight: 81.6 kg   Vital Signs: BP: 139/56 (06/23 1400) Pulse Rate: 83 (06/23 0930)  Labs: Recent Labs    02/08/20 0209 02/08/20 1317 02/08/20 1453  HGB 9.9*  --   --   HCT 28.4*  --   --   PLT 121*  --   --   APTT 33  --  126*  LABPROT 13.7  --   --   INR 1.1  --   --   HEPARINUNFRC  --  1.82* 0.75*  CREATININE 2.23*  --   --     Estimated Creatinine Clearance: 32.3 mL/min (A) (by C-G formula based on SCr of 2.23 mg/dL (H)).   Medical History: Past Medical History:  Diagnosis Date  . Arthritis   . COPD (chronic obstructive pulmonary disease) ([place])   . Diabetes ([place])   . Heart disease   . Hypertension   . Indigestion   . Thrombocytopenia ([place])   . Wears dentures    full upper and lower    Medications:  No PTA anticoagulation noted  Assessment: Pharmacy consulted to dose heparin in this 74 year old male admitted with DVT.   No prior anticoag noted.  CrCl = 32.3 ml/min   Pt given 5000 units bolus x 1, followed by heparin infusion at 1400 units/hr  0623 1453 HL 0.75 - APTT 126 - correlate - both slightly supratherapeutic  Goal of Therapy:  Heparin level 0.3-0.7 units/ml Monitor platelets by anticoagulation protocol: Yes   Plan:  Will reduce rate to 1200 units/hr and follow-up HL in 8 hours per protocol.  CBC's daily while on heparin drip.  Lu Duffel, PharmD, BCPS Clinical Pharmacist 02/08/2020 3:59 PM

## 2020-02-08 NOTE — ED Notes (Addendum)
Pt ate 25% of breakfast.

## 2020-02-08 NOTE — Progress Notes (Signed)
ANTICOAGULATION CONSULT NOTE - Initial Consult  Pharmacy Consult for Heparin  Indication: DVT  Allergies  Allergen Reactions  . Pioglitazone Swelling  . Atorvastatin Rash  . Benazepril Rash  . Latex Rash    (tape only)  . Tape Rash    Patient Measurements: Height: 6\' 1"  (185.4 cm) Weight: 81.6 kg (180 lb) IBW/kg (Calculated) : 79.9 Heparin Dosing Weight: 81.6 kg   Vital Signs: Temp: 98 F (36.7 C) (06/23 0200) Temp Source: Oral (06/23 0200) BP: 145/77 (06/23 0431) Pulse Rate: 81 (06/23 0431)  Labs: Recent Labs    02/08/20 0209  HGB 9.9*  HCT 28.4*  PLT 121*  LABPROT 13.7  INR 1.1  CREATININE 2.23*    Estimated Creatinine Clearance: 32.3 mL/min (A) (by C-G formula based on SCr of 2.23 mg/dL (H)).   Medical History: Past Medical History:  Diagnosis Date  . Arthritis   . COPD (chronic obstructive pulmonary disease) ([place])   . Diabetes ([place])   . Heart disease   . Hypertension   . Indigestion   . Thrombocytopenia ([place])   . Wears dentures    full upper and lower    Medications:  (Not in a hospital admission)   Assessment: Pharmacy consulted to dose heparin in this 74 year old male admitted with DVT.   No prior anticoag noted.  CrCl = 32.3 ml/min   Goal of Therapy:  Heparin level 0.3-0.7 units/ml Monitor platelets by anticoagulation protocol: Yes   Plan:  Give 5000 units bolus x 1 Start heparin infusion at 1400 units/hr Check anti-Xa level in 8 hours and daily while on heparin Continue to monitor H&H and platelets  Lyndle Pang D 02/08/2020,5:10 AM

## 2020-02-08 NOTE — ED Notes (Signed)
Report to Debbie, RN.

## 2020-02-08 NOTE — ED Triage Notes (Signed)
Pt presents to ED with left leg swelling and cramping since Monday. Pt denies similar symptoms previously. Leg warm and skin is tight. Pain in leg comes intermittently and makes pt cry out loudly. Pt states he tried putting mustard on his leg but it didn't help with his pain.

## 2020-02-09 DIAGNOSIS — N179 Acute kidney failure, unspecified: Secondary | ICD-10-CM

## 2020-02-09 LAB — CBC
HCT: 23.5 % — ABNORMAL LOW (ref 39.0–52.0)
Hemoglobin: 8.2 g/dL — ABNORMAL LOW (ref 13.0–17.0)
MCH: 32.4 pg (ref 26.0–34.0)
MCHC: 34.9 g/dL (ref 30.0–36.0)
MCV: 92.9 fL (ref 80.0–100.0)
Platelets: 92 10*3/uL — ABNORMAL LOW (ref 150–400)
RBC: 2.53 MIL/uL — ABNORMAL LOW (ref 4.22–5.81)
RDW: 12.5 % (ref 11.5–15.5)
WBC: 3.7 10*3/uL — ABNORMAL LOW (ref 4.0–10.5)
nRBC: 0 % (ref 0.0–0.2)

## 2020-02-09 LAB — BASIC METABOLIC PANEL
Anion gap: 8 (ref 5–15)
BUN: 41 mg/dL — ABNORMAL HIGH (ref 8–23)
CO2: 28 mmol/L (ref 22–32)
Calcium: 8.2 mg/dL — ABNORMAL LOW (ref 8.9–10.3)
Chloride: 106 mmol/L (ref 98–111)
Creatinine, Ser: 2.14 mg/dL — ABNORMAL HIGH (ref 0.61–1.24)
GFR calc Af Amer: 34 mL/min — ABNORMAL LOW (ref >60)
GFR calc non Af Amer: 29 mL/min — ABNORMAL LOW (ref >60)
Glucose, Bld: 117 mg/dL — ABNORMAL HIGH (ref 70–99)
Potassium: 3.7 mmol/L (ref 3.5–5.1)
Sodium: 142 mmol/L (ref 135–145)

## 2020-02-09 LAB — MAGNESIUM: Magnesium: 1.7 mg/dL (ref 1.7–2.4)

## 2020-02-09 LAB — HEPARIN LEVEL (UNFRACTIONATED)
Heparin Unfractionated: 0.44 IU/mL (ref 0.30–0.70)
Heparin Unfractionated: 0.52 IU/mL (ref 0.30–0.70)

## 2020-02-09 LAB — GLUCOSE, CAPILLARY
Glucose-Capillary: 27 mg/dL — CL (ref 70–99)
Glucose-Capillary: 124 mg/dL — ABNORMAL HIGH (ref 70–99)
Glucose-Capillary: 162 mg/dL — ABNORMAL HIGH (ref 70–99)
Glucose-Capillary: 162 mg/dL — ABNORMAL HIGH (ref 70–99)
Glucose-Capillary: 228 mg/dL — ABNORMAL HIGH (ref 70–99)
Glucose-Capillary: 221 mg/dL — ABNORMAL HIGH (ref 70–99)

## 2020-02-09 MED ORDER — INSULIN ASPART 100 UNIT/ML ~~LOC~~ SOLN
0.0000 [IU] | Freq: Three times a day (TID) | SUBCUTANEOUS | Status: DC
  Administered 2020-02-10 – 2020-02-11 (×2): 2 [IU] via SUBCUTANEOUS
  Administered 2020-02-11 – 2020-02-12 (×3): 3 [IU] via SUBCUTANEOUS
  Administered 2020-02-12 – 2020-02-14 (×5): 2 [IU] via SUBCUTANEOUS
  Administered 2020-02-14: 18:00:00 1 [IU] via SUBCUTANEOUS
  Administered 2020-02-14: 13:00:00 7 [IU] via SUBCUTANEOUS
  Administered 2020-02-15 (×2): 2 [IU] via SUBCUTANEOUS
  Filled 2020-02-09 (×14): qty 1

## 2020-02-09 MED ORDER — FUROSEMIDE 10 MG/ML IJ SOLN
40.0000 mg | Freq: Once | INTRAMUSCULAR | Status: AC
  Administered 2020-02-09: 40 mg via INTRAVENOUS
  Filled 2020-02-09: qty 4

## 2020-02-09 MED ORDER — APIXABAN 5 MG PO TABS
10.0000 mg | ORAL_TABLET | Freq: Two times a day (BID) | ORAL | Status: DC
  Administered 2020-02-09 – 2020-02-15 (×13): 10 mg via ORAL
  Filled 2020-02-09 (×13): qty 2

## 2020-02-09 MED ORDER — APIXABAN 5 MG PO TABS: 5.0000 mg | ORAL_TABLET | Freq: Two times a day (BID) | ORAL | Status: DC

## 2020-02-09 MED ORDER — INSULIN ASPART 100 UNIT/ML ~~LOC~~ SOLN
0.0000 [IU] | Freq: Every day | SUBCUTANEOUS | Status: DC
  Filled 2020-02-09: qty 1

## 2020-02-09 NOTE — Progress Notes (Signed)
[place] for Heparin  Indication: DVT  Allergies  Allergen Reactions  . Pioglitazone Swelling  . Metformin And Related Diarrhea  . Atorvastatin Rash  . Benazepril Rash  . Latex Rash    (tape only)  . Tape Rash    Patient Measurements: Height: 6\' 1"  (185.4 cm) Weight: 81.6 kg (180 lb) IBW/kg (Calculated) : 79.9 Heparin Dosing Weight: 81.6 kg   Vital Signs: Temp: 99 F (37.2 C) (06/24 0812) Temp Source: Oral (06/24 0812) BP: 146/76 (06/24 0812) Pulse Rate: 91 (06/24 0812)  Labs: Recent Labs    02/08/20 0209 02/08/20 1317 02/08/20 1453 02/09/20 0012 02/09/20 0754  HGB 9.9*  --   --  8.2*  --   HCT 28.4*  --   --  23.5*  --   PLT 121*  --   --  92*  --   APTT 33  --  126*  --   --   LABPROT 13.7  --   --   --   --   INR 1.1  --   --   --   --   HEPARINUNFRC  --    < > 0.75* 0.44 0.52  CREATININE 2.23*  --   --  2.14*  --    < > = values in this interval not displayed.    Estimated Creatinine Clearance: 33.7 mL/min (A) (by C-G formula based on SCr of 2.14 mg/dL (H)).   Medical History: Past Medical History:  Diagnosis Date  . Arthritis   . COPD (chronic obstructive pulmonary disease) ([place])   . Diabetes ([place])   . Heart disease   . Hypertension   . Indigestion   . Thrombocytopenia ([place])   . Wears dentures    full upper and lower    Medications:  No PTA anticoagulation noted  Assessment: Pharmacy consulted to dose heparin in this 74 year old male admitted with DVT.   No prior anticoag noted.  CrCl = 33.7 ml/min   0623 1453 HL 0.75 - APTT 126 - correlate - both slightly supratherapeutic  Goal of Therapy:  Heparin level 0.3-0.7 units/ml Monitor platelets by anticoagulation protocol: Yes   Plan:  6/24 @ 0754 HL 0.52.  Therapeutic x 2.  CBC trending low, but appears low at baseline.  Spoke with RN and confirmed no signs/symptoms of bleeding at this time.  Will continue patient on current rate, and recheck HL and  CBC with AM labs.  Gerald Dexter, PharmD Clinical Pharmacist 02/09/2020 8:50 AM

## 2020-02-09 NOTE — Progress Notes (Signed)
PROGRESS NOTE    Rick Zuniga  BMW:[id number] DOB: 1946-02-04 DOA: 02/08/2020 PCP: [name], MD    Assessment & Plan:   Active Problems:   AKI (acute kidney injury) ([place])    Rick Zuniga  is a 74 y.o. Caucasian woman male with a known history of type 2 diabetes mellitus, hypertension, coronary artery disease, COPD and osteoarthritis, presented to the emergency room with acute onset of bilateral lower extremity pain and swelling which have been worsening over the last 5 days.  Prior to that the patient had an eye procedure that required IV contrast.  He had no recent long travels or other surgeries.  He stated that he stayed in a recliner all day yesterday.  He usually has good pain intolerance and was shouting in pain per his wife.  He has chronic diarrhea but denied any nausea or vomiting or abdominal pain.  No dysuria, oliguria or hematuria or flank pain.  He has been having chills and recent cold intolerance.  He admitted to recent dyspnea on exertion with orthopnea and paroxysmal nocturnal dyspnea with no chest pain or palpitations.   1.  Acute kidney injury CKD --Cr didn't improve much after MIVF.  Since pt has BLE swelling and DOE, pt may have congestion instead. PLAN: --IV lasix 40 mg BID --Strict I/O --Nephrology consult, per request of pt's fiance.   2.  Left lower extremity DVT. -Uncertain chronicity.  Started on IV heparin on admission. PLAN: -transition to Eliquis today  3.  Dyspnea on exertion and orthopnea with bilateral lower extremity edema. Mild systolic CHF and Grade II diastolic dysfunction -Echo showed LVEF 40-45% -CXR showed "New left pleural effusion with associated airspace disease."   PLAN: --IV lasix 40 mg BID --Strict I/O  4.  Hypertension. -Continue amlodipine, Coreg, Imdur and hold off Cozaar given acute kidney injury.  5.  Type 2 diabetes mellitus. Hypoglycemic episode -Had BG 27 last night.  Pt's home med was listed as  Novolin 70/30 10u BID, however, pt reported taking 5u BID. --d/c scheduled insulin --ACHS and SSI  6.  Coronary artery disease. -continue his Imdur and Coreg.    DVT prophylaxis: UV:[id number] Code Status: Full code  Family Communication: fiance updated at the bedside Status is: inpatient Dispo:   The patient is from: home Anticipated d/c is to: home Anticipated d/c date is: tomorrow Patient currently is not medically stable to d/c due to: getting IV diuresis for volume overload   Subjective and Interval History:  Pt reported his left leg pain and swelling both improved.  Felt sleepy and slept most of the day.  Not eating because pt didn't like the food.  No fever, chest pain, abdominal pain, N/V/D, dysuria.    Objective: Vitals:   02/09/20 0405 02/09/20 0812 02/09/20 1215 02/09/20 1700  BP: (!) 144/75 (!) 146/76 127/65 133/74  Pulse: 83 91 84 89  Resp: 16 18 16 18   Temp: 98 F (36.7 C) 99 F (37.2 C) 99 F (37.2 C) 98.4 F (36.9 C)  TempSrc: Oral Oral Oral Oral  SpO2: 93% 96% 93% 98%  Weight:      Height:        Intake/Output Summary (Last 24 hours) at 02/09/2020 1947 Last data filed at 02/09/2020 1227 Gross per 24 hour  Intake 436.44 ml  Output 2100 ml  Net -1663.56 ml   Filed Weights   02/08/20 0212  Weight: 81.6 kg    Examination:   Constitutional: NAD, AAOx3, lying in bed,  appeared comfortable HEENT: conjunctivae and lids normal, EOMI CV: RRR no M,R,G. Distal pulses +2.  No cyanosis.   RESP: CTA B/L, normal respiratory effort  GI: +BS, NTND Extremities: Non pitting edema in BLE SKIN: warm, dry and intact Neuro: II - XII grossly intact.  Sensation intact Psych: Normal mood and affect.     Data Reviewed: I have personally reviewed following labs and imaging studies  CBC: Recent Labs  Lab 02/08/20 0209 02/09/20 0012  WBC 5.2 3.7*  NEUTROABS 3.7  --   HGB 9.9* 8.2*  HCT 28.4* 23.5*  MCV 92.5 92.9  PLT 121* 92*   Basic Metabolic  Panel: Recent Labs  Lab 02/08/20 0209 02/09/20 0012  NA 140 142  K 4.0 3.7  CL 103 106  CO2 28 28  GLUCOSE 158* 117*  BUN 48* 41*  CREATININE 2.23* 2.14*  CALCIUM 9.1 8.2*  MG  --  1.7   GFR: Estimated Creatinine Clearance: 33.7 mL/min (A) (by C-G formula based on SCr of 2.14 mg/dL (H)). Liver Function Tests: No results for input(s): AST, ALT, ALKPHOS, BILITOT, PROT, ALBUMIN in the last 168 hours. No results for input(s): LIPASE, AMYLASE in the last 168 hours. No results for input(s): AMMONIA in the last 168 hours. Coagulation Profile: Recent Labs  Lab 02/08/20 0209  INR 1.1   Cardiac Enzymes: No results for input(s): CKTOTAL, CKMB, CKMBINDEX, TROPONINI in the last 168 hours. BNP (last 3 results) No results for input(s): PROBNP in the last 8760 hours. HbA1C: No results for input(s): HGBA1C in the last 72 hours. CBG: Recent Labs  Lab 02/08/20 2143 02/09/20 0105 02/09/20 0811 02/09/20 1214 02/09/20 1659  GLUCAP 72 124* 162* 162* 228*   Lipid Profile: No results for input(s): CHOL, HDL, LDLCALC, TRIG, CHOLHDL, LDLDIRECT in the last 72 hours. Thyroid Function Tests: No results for input(s): TSH, T4TOTAL, FREET4, T3FREE, THYROIDAB in the last 72 hours. Anemia Panel: No results for input(s): VITAMINB12, FOLATE, FERRITIN, TIBC, IRON, RETICCTPCT in the last 72 hours. Sepsis Labs: No results for input(s): PROCALCITON, LATICACIDVEN in the last 168 hours.  Recent Results (from the past 240 hour(s))  SARS Coronavirus 2 by RT PCR (hospital order, performed in [facility] hospital lab) Nasopharyngeal Nasopharyngeal Swab     Status: None   Collection Time: 02/08/20  5:26 AM   Specimen: Nasopharyngeal Swab  Result Value Ref Range Status   SARS Coronavirus 2 NEGATIVE NEGATIVE Final    Comment: (NOTE) SARS-CoV-2 target nucleic acids are NOT DETECTED.  The SARS-CoV-2 RNA is generally detectable in upper and lower respiratory specimens during the acute phase of infection.  The lowest concentration of SARS-CoV-2 viral copies this assay can detect is 250 copies / mL. A negative result does not preclude SARS-CoV-2 infection and should not be used as the sole basis for treatment or other patient management decisions.  A negative result may occur with improper specimen collection / handling, submission of specimen other than nasopharyngeal swab, presence of viral mutation(s) within the areas targeted by this assay, and inadequate number of viral copies (<250 copies / mL). A negative result must be combined with clinical observations, patient history, and epidemiological information.  Fact Sheet for Patients:   StrictlyIdeas.no  Fact Sheet for Healthcare Providers: BankingDealers.co.za  This test is not yet approved or  cleared by the [place] FDA and has been authorized for detection and/or diagnosis of SARS-CoV-2 by FDA under an Emergency Use Authorization (EUA).  This EUA will remain in effect (meaning this test  can be used) for the duration of the COVID-19 declaration under Section 564(b)(1) of the Act, 21 U.S.C. section 360bbb-3(b)(1), unless the authorization is terminated or revoked sooner.  Performed at [facility], [address] [place], [place] [address]       Radiology Studies: US Venous Img Lower Unilateral Left  Result Date: 02/08/2020 CLINICAL DATA:  Left swelling and pain for 2 days. EXAM: LEFT LOWER EXTREMITY VENOUS DOPPLER ULTRASOUND TECHNIQUE: Gray-scale sonography with compression, as well as color and duplex ultrasound, were performed to evaluate the deep venous system(s) from the level of the common femoral vein through the popliteal and proximal calf veins. COMPARISON:  None. FINDINGS: VENOUS There is nonocclusive thrombus within the common femoral vein and at the saphenofemoral junction. Thrombus appears eccentric. Thrombus extends to involve the saphenous vein. Normal  compressibility of the superficial femoral and popliteal veins, as well as the visualized calf veins. Visualized portions of profunda femoral vein and great saphenous vein unremarkable. No filling defects to suggest DVT on grayscale or color Doppler imaging. Doppler waveforms show normal direction of venous flow, normal respiratory plasticity and response to augmentation. Limited views of the contralateral common femoral vein are unremarkable. OTHER Soft tissue edema in the calf. Limitations: None IMPRESSION: 1. Positive for nonocclusive thrombus in the left common femoral vein, saphenofemoral junction and saphenous vein. Acuity is uncertain as this appears eccentric, however no prior exams are available for comparison. 2. No DVT in the femoral or popliteal veins. These results were called by telephone at the time of interpretation on 02/08/2020 at 3:45 am to Dr Marjean [name] , who verbally acknowledged these results. Electronically Signed   By: Keith Rake M.D.   On: 02/08/2020 03:46   US Venous Img Lower Unilateral Right  Result Date: 02/08/2020 CLINICAL DATA:  Pain and swelling in the legs. Left lower extremity DVT. EXAM: Right LOWER EXTREMITY VENOUS DOPPLER ULTRASOUND TECHNIQUE: Gray-scale sonography with compression, as well as color and duplex ultrasound, were performed to evaluate the deep venous system(s) from the level of the common femoral vein through the popliteal and proximal calf veins. COMPARISON:  Left lower extremity venous Doppler ultrasound 02/08/2020. FINDINGS: VENOUS Normal compressibility of the common femoral, superficial femoral, and popliteal veins, as well as the visualized calf veins. Visualized portions of profunda femoral vein and great saphenous vein unremarkable. No filling defects to suggest DVT on grayscale or color Doppler imaging. Doppler waveforms show normal direction of venous flow, normal respiratory plasticity and response to augmentation. Limited views of the  contralateral common femoral vein again demonstrate the previously diagnosis nonocclusive thrombus. OTHER None. Limitations: none IMPRESSION: Normal right lower extremity venous Doppler ultrasound. Electronically Signed   By: San Morelle M.D.   On: 02/08/2020 06:34   DG Chest Portable 1 View  Result Date: 02/08/2020 CLINICAL DATA:  Orthopnea. Left leg swelling and cramping for 2 days. Left lower extremity DVT EXAM: PORTABLE CHEST 1 VIEW COMPARISON:  One-view chest x-ray 05/25/2019 FINDINGS: Heart is enlarged. A new left pleural effusion is present. Associated left lower lobe airspace disease is present. Mild pulmonary vascular congestion is noted. No focal airspace disease is present on the right. IMPRESSION: New left pleural effusion with associated airspace disease. This may represent atelectasis or infection. Pulmonary embolus is also considered. Recommend CTA chest for further evaluation. Electronically Signed   By: San Morelle M.D.   On: 02/08/2020 07:23   ECHOCARDIOGRAM COMPLETE  Result Date: 02/08/2020    ECHOCARDIOGRAM REPORT   Patient Name:  Dionisio Paschal Date of Exam: 02/08/2020 Medical Rec #:  [id number]           Height:       73.0 in Accession #:    [id number]          Weight:       180.0 lb Date of Birth:  01-30-1946           BSA:          2.057 m Patient Age:    79 years            BP:           139/74 mmHg Patient Gender: M                   HR:           80 bpm. Exam Location:  [facility] Procedure: 2D Echo, Color Doppler and Cardiac Doppler Indications:     R06.00 Dyspnea  History:         Patient has prior history of Echocardiogram examinations. COPD;                  Risk Factors:Hypertension and Diabetes.  Sonographer:     Charmayne Sheer RDCS (AE) Referring Phys:  [id number] Arvella Merles MANSY Diagnosing Phys: Yolonda Kida MD IMPRESSIONS  1. Left ventricular ejection fraction, by estimation, is 40 to 45%. The left ventricle has mildly decreased function. The left ventricle  demonstrates global hypokinesis. The left ventricular internal cavity size was mildly to moderately dilated. Left ventricular diastolic parameters are consistent with Grade II diastolic dysfunction (pseudonormalization).  2. Right ventricular systolic function is normal. The right ventricular size is normal.  3. Left atrial size was mildly dilated.  4. Right atrial size was mildly dilated.  5. The mitral valve is grossly normal. No evidence of mitral valve regurgitation.  6. The aortic valve is grossly normal. Aortic valve regurgitation is not visualized. FINDINGS  Left Ventricle: Left ventricular ejection fraction, by estimation, is 40 to 45%. The left ventricle has mildly decreased function. The left ventricle demonstrates global hypokinesis. The left ventricular internal cavity size was mildly to moderately dilated. There is no left ventricular hypertrophy. Left ventricular diastolic parameters are consistent with Grade II diastolic dysfunction (pseudonormalization). Right Ventricle: The right ventricular size is normal. No increase in right ventricular wall thickness. Right ventricular systolic function is normal. Left Atrium: Left atrial size was mildly dilated. Right Atrium: Right atrial size was mildly dilated. Pericardium: There is no evidence of pericardial effusion. Mitral Valve: The mitral valve is grossly normal. No evidence of mitral valve regurgitation. MV peak gradient, 4.2 mmHg. The mean mitral valve gradient is 2.0 mmHg. Tricuspid Valve: The tricuspid valve is normal in structure. Tricuspid valve regurgitation is trivial. Aortic Valve: The aortic valve is grossly normal. Aortic valve regurgitation is not visualized. Aortic valve mean gradient measures 4.0 mmHg. Aortic valve peak gradient measures 9.7 mmHg. Aortic valve area, by VTI measures 2.83 cm. Pulmonic Valve: The pulmonic valve was normal in structure. Pulmonic valve regurgitation is not visualized. Aorta: The aortic root is normal in size  and structure. IAS/Shunts: No atrial level shunt detected by color flow Doppler.  LEFT VENTRICLE PLAX 2D LVIDd:         5.54 cm  Diastology LVIDs:         4.39 cm  LV e' lateral:   8.05 cm/s LV PW:         0.86 cm  LV E/e' lateral: 10.7 LV IVS:        0.63 cm  LV e' medial:    4.79 cm/s LVOT diam:     2.20 cm  LV E/e' medial:  18.0 LV SV:         77 LV SV Index:   38 LVOT Area:     3.80 cm  RIGHT VENTRICLE RV Basal diam:  3.99 cm LEFT ATRIUM             Index       RIGHT ATRIUM           Index LA diam:        4.20 cm 2.04 cm/m  RA Area:     12.70 cm LA Vol (A2C):   53.9 ml 26.20 ml/m RA Volume:   25.30 ml  12.30 ml/m LA Vol (A4C):   30.2 ml 14.68 ml/m LA Biplane Vol: 42.3 ml 20.56 ml/m  AORTIC VALVE                   PULMONIC VALVE AV Area (Vmax):    2.29 cm    PV Vmax:       1.10 m/s AV Area (Vmean):   2.45 cm    PV Vmean:      79.700 cm/s AV Area (VTI):     2.83 cm    PV VTI:        0.238 m AV Vmax:           156.00 cm/s PV Peak grad:  4.8 mmHg AV Vmean:          94.900 cm/s PV Mean grad:  3.0 mmHg AV VTI:            0.273 m AV Peak Grad:      9.7 mmHg AV Mean Grad:      4.0 mmHg LVOT Vmax:         93.80 cm/s LVOT Vmean:        61.200 cm/s LVOT VTI:          0.203 m LVOT/AV VTI ratio: 0.74  AORTA Ao Root diam: 3.40 cm MITRAL VALVE MV Area (PHT): 3.27 cm    SHUNTS MV Peak grad:  4.2 mmHg    Systemic VTI:  0.20 m MV Mean grad:  2.0 mmHg    Systemic Diam: 2.20 cm MV Vmax:       1.02 m/s MV Vmean:      72.8 cm/s MV Decel Time: 232 msec MV E velocity: 86.30 cm/s MV A velocity: 58.20 cm/s MV E/A ratio:  1.48 Dwayne D Callwood MD Electronically signed by Yolonda Kida MD Signature Date/Time: 02/08/2020/4:28:10 PM    Final    Intravitreal Injection, Pharmacologic Agent - OD - Right Eye  Result Date: 02/07/2020 Time Out 02/06/2020. 5:01 PM. Confirmed correct patient, procedure, site, and patient consented. Anesthesia Topical anesthesia was used. Anesthetic medications included Lidocaine 2%, Proparacaine  0.5%. Procedure Preparation included 5% betadine to ocular surface, eyelid speculum. A supplied needle was used. Injection: 1.25 mg Bevacizumab (AVASTIN) SOLN   NDC: 87867-672-09, Lot: [id number] , Expiration date: 04/11/2020   Route: Intravitreal, Site: Right Eye, Waste: 0 mL Post-op Post injection exam found visual acuity of at least counting fingers. The patient tolerated the procedure well. There were no complications. The patient received written and verbal post procedure care education.   Intravitreal Injection, Pharmacologic Agent - OS - Left Eye  Result Date: 02/07/2020 Time Out 02/06/2020. 5:02 PM. Confirmed  correct patient, procedure, site, and patient consented. Anesthesia Topical anesthesia was used. Anesthetic medications included Lidocaine 2%, Proparacaine 0.5%. Procedure Preparation included 5% betadine to ocular surface, eyelid speculum. A supplied needle was used. Injection: 1.25 mg Bevacizumab (AVASTIN) SOLN   NDC: 25189-842-10, Lot: [id number] , Expiration date: 03/28/2020   Route: Intravitreal, Site: Left Eye, Waste: 0 mL Post-op Post injection exam found visual acuity of at least counting fingers. The patient tolerated the procedure well. There were no complications. The patient received written and verbal post procedure care education.     Scheduled Meds:  acetaminophen  1,000 mg Oral TID   amLODipine  10 mg Oral Daily   apixaban  10 mg Oral BID   Followed by   21/06/2020 ON 02/16/2020] apixaban  5 mg Oral BID   aspirin EC  81 mg Oral Daily   carvedilol  6.25 mg Oral BID WC   cyclobenzaprine  10 mg Oral TID   isosorbide mononitrate  30 mg Oral Daily   Continuous Infusions:   LOS: 1 day     20/08/2019, MD [facility] If 7PM-7AM, please contact night-coverage 02/09/2020, 7:47 PM

## 2020-02-09 NOTE — Progress Notes (Signed)
ANTICOAGULATION CONSULT NOTE - Initial Consult  Pharmacy Consult for Heparin  Indication: DVT  Allergies  Allergen Reactions  . Pioglitazone Swelling  . Metformin And Related Diarrhea  . Atorvastatin Rash  . Benazepril Rash  . Latex Rash    (tape only)  . Tape Rash    Patient Measurements: Height: 6\' 1"  (185.4 cm) Weight: 81.6 kg (180 lb) IBW/kg (Calculated) : 79.9 Heparin Dosing Weight: 81.6 kg   Vital Signs: Temp: 97.5 F (36.4 C) (06/24 0011) Temp Source: Oral (06/24 0011) BP: 116/52 (06/24 0011) Pulse Rate: 62 (06/24 0011)  Labs: Recent Labs    02/08/20 0209 02/08/20 1317 02/08/20 1453 02/09/20 0012  HGB 9.9*  --   --  8.2*  HCT 28.4*  --   --  23.5*  PLT 121*  --   --  92*  APTT 33  --  126*  --   LABPROT 13.7  --   --   --   INR 1.1  --   --   --   HEPARINUNFRC  --  1.82* 0.75* 0.44  CREATININE 2.23*  --   --  2.14*    Estimated Creatinine Clearance: 33.7 mL/min (A) (by C-G formula based on SCr of 2.14 mg/dL (H)).   Medical History: Past Medical History:  Diagnosis Date  . Arthritis   . COPD (chronic obstructive pulmonary disease) ([place])   . Diabetes ([place])   . Heart disease   . Hypertension   . Indigestion   . Thrombocytopenia ([place])   . Wears dentures    full upper and lower    Medications:  No PTA anticoagulation noted  Assessment: Pharmacy consulted to dose heparin in this 74 year old male admitted with DVT.   No prior anticoag noted.  CrCl = 32.3 ml/min   Pt given 5000 units bolus x 1, followed by heparin infusion at 1400 units/hr  0623 1453 HL 0.75 - APTT 126 - correlate - both slightly supratherapeutic  Goal of Therapy:  Heparin level 0.3-0.7 units/ml Monitor platelets by anticoagulation protocol: Yes   Plan:  Will reduce rate to 1200 units/hr and follow-up HL in 8 hours per protocol.  6/24:  HL @ 0012 = 0.44 Will continue pt on current rate and recheck HL on 6/24 @ 0800.   CBC's daily while on heparin drip.  Orene Desanctis, PharmD Clinical Pharmacist 02/09/2020 12:50 AM

## 2020-02-09 NOTE — Discharge Instructions (Signed)
Information on my medicine - ELIQUIS (apixaban)  This medication education was reviewed with me or my healthcare representative as part of my discharge preparation.   Why was Eliquis prescribed for you? Eliquis was prescribed to treat blood clots that may have been found in the veins of your legs (deep vein thrombosis) or in your lungs (pulmonary embolism) and to reduce the risk of them occurring again.  What do You need to know about Eliquis ? The starting dose is 10 mg (two 5 mg tablets) taken TWICE daily for the FIRST SEVEN (7) DAYS, then on 02/16/2020 the dose is reduced to ONE 5 mg tablet taken TWICE daily.  Eliquis may be taken with or without food.   Try to take the dose about the same time in the morning and in the evening. If you have difficulty swallowing the tablet whole please discuss with your pharmacist how to take the medication safely.  Take Eliquis exactly as prescribed and DO NOT stop taking Eliquis without talking to the doctor who prescribed the medication.  Stopping may increase your risk of developing a new blood clot.  Refill your prescription before you run out.  After discharge, you should have regular check-up appointments with your healthcare provider that is prescribing your Eliquis.    What do you do if you miss a dose? If a dose of ELIQUIS is not taken at the scheduled time, take it as soon as possible on the same day and twice-daily administration should be resumed. The dose should not be doubled to make up for a missed dose.  Important Safety Information A possible side effect of Eliquis is bleeding. You should call your healthcare provider right away if you experience any of the following: ? Bleeding from an injury or your nose that does not stop. ? Unusual colored urine (red or dark brown) or unusual colored stools (red or black). ? Unusual bruising for unknown reasons. ? A serious fall or if you hit your head (even if there is no bleeding).  Some  medicines may interact with Eliquis and might increase your risk of bleeding or clotting while on Eliquis. To help avoid this, consult your healthcare provider or pharmacist prior to using any new prescription or non-prescription medications, including herbals, vitamins, non-steroidal anti-inflammatory drugs (NSAIDs) and supplements.  This website has more information on Eliquis (apixaban): http://www.eliquis.com/eliquis/home

## 2020-02-09 NOTE — Progress Notes (Signed)
Inpatient Diabetes Program Recommendations  AACE/ADA: New Consensus Statement on Inpatient Glycemic Control (2015)  Target Ranges:  Prepandial:   less than 140 mg/dL      Peak postprandial:   less than 180 mg/dL (1-2 hours)      Critically ill patients:  140 - 180 mg/dL   Lab Results  Component Value Date   GLUCAP 162 (H) 02/09/2020   HGBA1C 10.3 (H) 05/26/2019    Review of Glycemic Control Results for Rick Zuniga, Rick Zuniga (MRN [id number]) as of 02/09/2020 10:18  Ref. Range 02/08/2020 07:42 02/08/2020 10:19 02/08/2020 12:24 02/08/2020 17:12 02/08/2020 21:12 02/08/2020 21:22 02/08/2020 21:43 02/09/2020 01:05 02/09/2020 08:11  Glucose-Capillary Latest Ref Range: 70 - 99 mg/dL 101 (H) 95 86 200 (H) 27 (LL) 43 (LL) 72 124 (H) 162 (H)   Diabetes history: DM 2 Outpatient Diabetes medications:  Novolin 70/30 10 units bid Current orders for Inpatient glycemic control:  None  Inpatient Diabetes Program Recommendations:    Note that patient had significant hypoglycemia overnight.  All insulins currently on hold. A1C pending.   May consider restarting "Very Sensitive" Novolog correction 0-6 units tid with meals and HS.  Thanks,  Adah Perl, RN, BC-ADM Inpatient Diabetes Coordinator Pager [phone] (8a-5p)

## 2020-02-09 NOTE — Plan of Care (Signed)
°  Problem: Health Behavior/Discharge Planning: Goal: Ability to manage health-related needs will improve Outcome: Progressing   Problem: Clinical Measurements: Goal: Will remain free from infection Outcome: Progressing Goal: Respiratory complications will improve 02/09/2020 0403 by Gray Bernhardt, RN Outcome: Progressing 02/09/2020 0359 by Gray Bernhardt, RN Outcome: Progressing Goal: Cardiovascular complication will be avoided 02/09/2020 0403 by Gray Bernhardt, RN Outcome: Progressing 02/09/2020 0359 by Gray Bernhardt, RN Outcome: Progressing   Problem: Activity: Goal: Risk for activity intolerance will decrease 02/09/2020 0403 by Gray Bernhardt, RN Outcome: Progressing 02/09/2020 0359 by Gray Bernhardt, RN Outcome: Progressing   Problem: Pain Managment: Goal: General experience of comfort will improve Outcome: Progressing   Problem: Safety: Goal: Ability to remain free from injury will improve 02/09/2020 0403 by Gray Bernhardt, RN Outcome: Progressing 02/09/2020 0359 by Gray Bernhardt, RN Outcome: Progressing

## 2020-02-09 NOTE — Progress Notes (Addendum)
Pt had BG of 27 at 2112. Observed sitting at side of bed. Diaphoretic. A/o x 4.  4 oz OJ given. BG rechecked at 2122-43. Pt refused D50 and requested another 4 oz of  OJ and dinner. BG rechecked at 2143-73. BG 124 at 0105. NP notified. Will continue to monitor.

## 2020-02-09 NOTE — TOC Initial Note (Signed)
Transition of Care [facility]) - Initial/Assessment Note    Patient Details  Name: Rick Zuniga MRN: [id number] Date of Birth: 10-Jan-1946  [facility] CM/SW Contact:    Shelbie Hutching, RN Phone Number: 02/09/2020, 3:27 PM  Clinical Narrative:                 Patient admitted to the hospital with acute renal failure and acute DVT.  This RNCM introduced self to patient's significant other at the bedside.  Patient is sleeping and did not wake up during conversation.  Per Letta Median, patient's significant other, patient is independent and lives alone in [place].  She does not believe that he will be interested in home health at discharge but she will discuss it with him when he wakes up.  Patient is current with his PCP, pulmonologist, and cardiologist.  RNCM will cont to follow for needs.   Expected Discharge Plan: Home/Self Care Barriers to Discharge: Continued Medical Work up   Patient Goals and CMS Choice Patient states their goals for this hospitalization and ongoing recovery are:: Pt sleeping      Expected Discharge Plan and Services Expected Discharge Plan: Home/Self Care   Discharge Planning Services: CM Consult   Living arrangements for the past 2 months: Single Family Home                                      Prior Living Arrangements/Services Living arrangements for the past 2 months: Single Family Home Lives with:: Self Patient language and need for interpreter reviewed:: Yes Do you feel safe going back to the place where you live?: Yes      Need for Family Participation in Patient Care: Yes (Comment) Care giver support system in place?: Yes (comment)   Criminal Activity/Legal Involvement Pertinent to Current Situation/Hospitalization: No - Comment as needed  Activities of Daily Living Home Assistive Devices/Equipment: Dentures (specify type) ADL Screening (condition at time of admission) Patient's cognitive ability adequate to safely complete  daily activities?: Yes Is the patient deaf or have difficulty hearing?: No Does the patient have difficulty seeing, even when wearing glasses/contacts?: No Does the patient have difficulty concentrating, remembering, or making decisions?: No Patient able to express need for assistance with ADLs?: Yes Does the patient have difficulty dressing or bathing?: No Independently performs ADLs?: Yes (appropriate for developmental age) Does the patient have difficulty walking or climbing stairs?: Yes Weakness of Legs: Both Weakness of Arms/Hands: None  Permission Sought/Granted Permission sought to share information with : Case Manager, Family Supports Permission granted to share information with : Yes, Verbal Permission Granted  Share Information with NAME: Ileana Roup     Permission granted to share info w Relationship: significant other     Emotional Assessment Appearance:: Appears stated age       Alcohol / Substance Use: Not Applicable Psych Involvement: No (comment)  Admission diagnosis:  AKI (acute kidney injury) ([place]) [N17.9] Acute deep vein thrombosis (DVT) of distal vein of left lower extremity ([place]) [I82.4Z2] Acute renal failure, unspecified acute renal failure type ([place]) [N17.9] Patient Active Problem List   Diagnosis Date Noted  . AKI (acute kidney injury) ([place]) 02/08/2020  . 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 Nocturnal leg cramps 12/21/2019  . Pleural effusion 12/08/2019  . Shortness of breath 12/08/2019  . Chest pain 05/25/2019  . Esophageal dysphagia 05/09/2019  . Functional diarrhea 05/09/2019  .  RUQ pain 05/09/2019  . Other male erectile dysfunction 11/11/2018  . Lumbar radiculitis 10/17/2018  . Pedal edema 12/14/2017  . Vertical diplopia 11/02/2017  . Accelerated hypertension 10/27/2017  . Thrombocytopenia ([place]) 02/28/2016  . Anemia 02/28/2016  . Hyperlipidemia 10/23/2014  . CAD (coronary artery disease) 02/22/2014  . Hypertension associated with  diabetes ([place]) 02/22/2014  . Type 2 diabetes mellitus with microalbuminuria, with long-term current use of insulin ([place]) 02/22/2014   PCP:  [name], MD Pharmacy:   [facility] [address], [place] - [place] [address] Phone: [phone] Fax: [phone]     Social Determinants of Health (SDOH) Interventions    Readmission Risk Interventions No flowsheet data found.

## 2020-02-10 LAB — HEMOGLOBIN A1C
Hgb A1c MFr Bld: 9.3 % — ABNORMAL HIGH (ref 4.8–5.6)
Mean Plasma Glucose: 220 mg/dL

## 2020-02-10 LAB — BASIC METABOLIC PANEL
Anion gap: 12 (ref 5–15)
BUN: 37 mg/dL — ABNORMAL HIGH (ref 8–23)
CO2: 26 mmol/L (ref 22–32)
Calcium: 8.3 mg/dL — ABNORMAL LOW (ref 8.9–10.3)
Chloride: 101 mmol/L (ref 98–111)
Creatinine, Ser: 1.94 mg/dL — ABNORMAL HIGH (ref 0.61–1.24)
GFR calc Af Amer: 38 mL/min — ABNORMAL LOW (ref >60)
GFR calc non Af Amer: 33 mL/min — ABNORMAL LOW (ref >60)
Glucose, Bld: 222 mg/dL — ABNORMAL HIGH (ref 70–99)
Potassium: 3.7 mmol/L (ref 3.5–5.1)
Sodium: 139 mmol/L (ref 135–145)

## 2020-02-10 LAB — CBC
HCT: 24.8 % — ABNORMAL LOW (ref 39.0–52.0)
Hemoglobin: 8.6 g/dL — ABNORMAL LOW (ref 13.0–17.0)
MCH: 32.1 pg (ref 26.0–34.0)
MCHC: 34.7 g/dL (ref 30.0–36.0)
MCV: 92.5 fL (ref 80.0–100.0)
Platelets: 97 10*3/uL — ABNORMAL LOW (ref 150–400)
RBC: 2.68 MIL/uL — ABNORMAL LOW (ref 4.22–5.81)
RDW: 12.6 % (ref 11.5–15.5)
WBC: 7.7 10*3/uL (ref 4.0–10.5)
nRBC: 0 % (ref 0.0–0.2)

## 2020-02-10 LAB — GLUCOSE, CAPILLARY
Glucose-Capillary: 199 mg/dL — ABNORMAL HIGH (ref 70–99)
Glucose-Capillary: 158 mg/dL — ABNORMAL HIGH (ref 70–99)
Glucose-Capillary: 165 mg/dL — ABNORMAL HIGH (ref 70–99)
Glucose-Capillary: 187 mg/dL — ABNORMAL HIGH (ref 70–99)

## 2020-02-10 LAB — MAGNESIUM: Magnesium: 1.6 mg/dL — ABNORMAL LOW (ref 1.7–2.4)

## 2020-02-10 LAB — PROCALCITONIN: Procalcitonin: 0.2 ng/mL

## 2020-02-10 MED ORDER — ENSURE ENLIVE PO LIQD
237.0000 mL | Freq: Two times a day (BID) | ORAL | Status: DC
  Administered 2020-02-10 – 2020-02-13 (×5): 237 mL via ORAL

## 2020-02-10 MED ORDER — FUROSEMIDE 40 MG PO TABS
40.0000 mg | ORAL_TABLET | Freq: Every day | ORAL | Status: DC
  Administered 2020-02-11 – 2020-02-13 (×3): 40 mg via ORAL
  Filled 2020-02-10 (×3): qty 1

## 2020-02-10 MED ORDER — FUROSEMIDE 10 MG/ML IJ SOLN
40.0000 mg | Freq: Two times a day (BID) | INTRAMUSCULAR | Status: DC
  Administered 2020-02-10: 40 mg via INTRAVENOUS
  Filled 2020-02-10: qty 4

## 2020-02-10 MED ORDER — ACETAMINOPHEN 325 MG PO TABS
650.0000 mg | ORAL_TABLET | Freq: Four times a day (QID) | ORAL | Status: DC | PRN
  Administered 2020-02-10: 22:00:00 650 mg via ORAL
  Filled 2020-02-10: qty 2

## 2020-02-10 MED ORDER — MAGNESIUM SULFATE 2 GM/50ML IV SOLN
2.0000 g | Freq: Once | INTRAVENOUS | Status: AC
  Administered 2020-02-10: 12:00:00 2 g via INTRAVENOUS
  Filled 2020-02-10: qty 50

## 2020-02-10 NOTE — Progress Notes (Deleted)
Per MD, Pt is not allowed to shave his face due to being on a heparin drip yesterday and being on a blood thinner. Pt and visitor at bedside educated on why pt is not allowed to shave.

## 2020-02-10 NOTE — Progress Notes (Addendum)
Inpatient Diabetes Program Recommendations  AACE/ADA: New Consensus Statement on Inpatient Glycemic Control (2015)  Target Ranges:  Prepandial:   less than 140 mg/dL      Peak postprandial:   less than 180 mg/dL (1-2 hours)      Critically ill patients:  140 - 180 mg/dL   Lab Results  Component Value Date   GLUCAP 158 (H) 02/10/2020   HGBA1C 9.3 (H) 02/08/2020    Review of Glycemic Control Results for Rick Zuniga, Rick Zuniga (MRN [id number]) as of 02/10/2020 14:41  Ref. Range 02/09/2020 12:14 02/09/2020 16:59 02/09/2020 21:29 02/10/2020 08:01 02/10/2020 11:20  Glucose-Capillary Latest Ref Range: 70 - 99 mg/dL 162 (H) 228 (H) 221 (H) 199 (H) 158 (H)   Diabetes history: DM 2 Outpatient Diabetes medications: Novolog 70/30 10 units bid, Glucotrol 10 mg bid Current orders for Inpatient glycemic control:  Novolog sensitive tid with meals and HS  Inpatient Diabetes Program Recommendations:    Note that patient experienced hypoglycemia on 6/24 after receiving PM dose of 70/30.  Per patient he often has low blood sugars first thing in the mornings.  Discussed the potential harm in this and treatment.  He states that after he treats lows, he often has highs as well.  Note that GFR is reduced.  May need significant reduction in insulin at d/c and would definitely discontinue home Glucotrol at discharge as well.   Agree with current orders.  Significant other reports reduced appetite for the last 4 days as well.  Reported to desk that they were requesting PO supplement such as Ensure.   Adah Perl, RN, BC-ADM Inpatient Diabetes Coordinator Pager [phone] (8a-5p)

## 2020-02-10 NOTE — Consult Note (Signed)
[facility] CONSULT NOTE    Date: 02/10/2020                  Patient Name:  Rick Zuniga  MRN: [id number]  DOB: Apr 13, 1946  Age / Sex: 74 y.o., male         PCP: [name], MD                 Service Requesting Consult:  Hospitalist                 Reason for Consult:  Acute kidney injury, chronic kidney disease stage IIIb            History of Present Illness: Patient is a 74 y.o. male with a PMHx of COPD, diabetes mellitus type 2, coronary artery disease, hypertension, thrombocytopenia, chronic kidney disease stage IIIb, who was admitted to [facility] on 02/08/2020 for evaluation of left greater than right lower extremity edema.  Patient states that his symptoms are worsening 5 to 6-days prior to admission.  He is also had decreased appetite recently.  Left lower extremity DVT was noted upon admission.  He was also undergoing diuresis.  Thereafter his renal function deteriorated a bit.  He also appears to have reduced ejection fraction with EF of 40 to 45%.   Medications: Outpatient medications: Medications Prior to Admission  Medication Sig Dispense Refill Last Dose  . amLODipine (NORVASC) 10 MG tablet Take 1 tablet (10 mg total) by mouth daily. 30 tablet 0 02/07/2020 at Unknown time  . aspirin EC 81 MG tablet Take 81 mg by mouth daily.    02/07/2020 at Unknown time  . carvedilol (COREG) 6.25 MG tablet Take 6.25 mg by mouth 2 (two) times daily with a meal.   02/07/2020 at Unknown time  . glipiZIDE (GLUCOTROL) 10 MG tablet Take 10 mg by mouth 2 (two) times daily.    02/07/2020 at Unknown time  . insulin NPH-regular Human (NOVOLIN 70/30) (70-30) 100 UNIT/ML injection Inject 10 Units into the skin 2 (two) times daily.    02/07/2020 at Unknown time  . isosorbide mononitrate (IMDUR) 30 MG 24 hr tablet Take 30 mg by mouth daily.   02/07/2020 at Unknown time  . losartan (COZAAR) 100 MG tablet Take 100 mg by mouth daily.   02/07/2020 at Unknown time  .  nitroGLYCERIN (NITROSTAT) 0.4 MG SL tablet Place 0.4 mg under the tongue every 5 (five) minutes as needed for chest pain.   PRN at PRN  . torsemide (DEMADEX) 20 MG tablet Take 20 mg by mouth 2 (two) times daily.    02/07/2020 at Unknown time    Current medications: Current Facility-Administered Medications  Medication Dose Route Frequency Provider Last Rate Last Admin  . amLODipine (NORVASC) tablet 10 mg  10 mg Oral Daily Mansy, Jan A, MD   10 mg at 02/10/20 0848  . apixaban (ELIQUIS) tablet 10 mg  10 mg Oral BID Enzo Bi, MD   10 mg at 02/10/20 0848   Followed by  . [START ON 02/16/2020] apixaban (ELIQUIS) tablet 5 mg  5 mg Oral BID Enzo Bi, MD      . aspirin EC tablet 81 mg  81 mg Oral Daily Mansy, Jan A, MD   81 mg at 02/10/20 0848  . carvedilol (COREG) tablet 6.25 mg  6.25 mg Oral BID WC Mansy, Jan A, MD   6.25 mg at 02/10/20 0848  . furosemide (LASIX) injection 40 mg  40 mg  Intravenous BID Enzo Bi, MD   40 mg at 02/10/20 1151  . insulin aspart (novoLOG) injection 0-5 Units  0-5 Units Subcutaneous QHS Enzo Bi, MD      . insulin aspart (novoLOG) injection 0-9 Units  0-9 Units Subcutaneous TID WC Enzo Bi, MD   2 Units at 02/10/20 [phone]  . isosorbide mononitrate (IMDUR) 24 hr tablet 30 mg  30 mg Oral Daily Mansy, Jan A, MD   30 mg at 02/10/20 0848  . magnesium hydroxide (MILK OF MAGNESIA) suspension 30 mL  30 mL Oral Daily PRN Mansy, Jan A, MD      . nitroGLYCERIN (NITROSTAT) SL tablet 0.4 mg  0.4 mg Sublingual Q5 min PRN Mansy, Jan A, MD      . ondansetron [facility]) tablet 4 mg  4 mg Oral Q6H PRN Mansy, Jan A, MD       Or  . ondansetron [facility]) injection 4 mg  4 mg Intravenous Q6H PRN Mansy, Jan A, MD   4 mg at 02/10/20 1151  . traZODone (DESYREL) tablet 25 mg  25 mg Oral QHS PRN Mansy, Arvella Merles, MD          Allergies: Allergies  Allergen Reactions  . Pioglitazone Swelling  . Metformin And Related Diarrhea  . Atorvastatin Rash  . Benazepril Rash  . Latex Rash    (tape only)  .  Tape Rash      Past Medical History: Past Medical History:  Diagnosis Date  . Arthritis   . COPD (chronic obstructive pulmonary disease) ([place])   . Diabetes ([place])   . Heart disease   . Hypertension   . Indigestion   . Thrombocytopenia ([place])   . Wears dentures    full upper and lower     Past Surgical History: Past Surgical History:  Procedure Laterality Date  . APPENDECTOMY    . CARDIAC SURGERY     bypass  . CHOLECYSTECTOMY    . COLONOSCOPY  12/23/2010     Family History: Family History  Problem Relation Age of Onset  . CAD Mother   . Kidney failure Father   . Diabetes Father      Social History: Social History   Socioeconomic History  . Marital status: Single    Spouse name: Not on file  . Number of children: Not on file  . Years of education: Not on file  . Highest education level: Not on file  Occupational History  . Not on file  Tobacco Use  . Smoking status: Never Smoker  . Smokeless tobacco: Never Used  Vaping Use  . Vaping Use: Never used  Substance and Sexual Activity  . Alcohol use: No  . Drug use: No  . Sexual activity: Not on file  Other Topics Concern  . Not on file  Social History Narrative  . Not on file   Social Determinants of Health   Financial Resource Strain:   . Difficulty of Paying Living Expenses:   Food Insecurity:   . Worried About Charity fundraiser in the Last Year:   . Arboriculturist in the Last Year:   Transportation Needs:   . Film/video editor (Medical):   Marland Kitchen Lack of Transportation (Non-Medical):   Physical Activity:   . Days of Exercise per Week:   . Minutes of Exercise per Session:   Stress:   . Feeling of Stress :   Social Connections:   . Frequency of Communication with Friends and Family:   .  Frequency of Social Gatherings with Friends and Family:   . Attends Religious Services:   . Active Member of Clubs or Organizations:   . Attends Archivist Meetings:   Marland Kitchen Marital Status:    Intimate Partner Violence:   . Fear of Current or Ex-Partner:   . Emotionally Abused:   Marland Kitchen Physically Abused:   . Sexually Abused:      Review of Systems: Review of Systems  Constitutional: Positive for malaise/fatigue. Negative for chills and fever.  HENT: Negative for congestion, hearing loss and tinnitus.   Eyes: Negative for blurred vision and double vision.  Respiratory: Negative for cough, sputum production and shortness of breath.   Cardiovascular: Positive for leg swelling. Negative for chest pain, palpitations and orthopnea.  Gastrointestinal: Negative for diarrhea, nausea and vomiting.  Genitourinary: Negative for dysuria, frequency and urgency.  Musculoskeletal: Negative for myalgias.  Skin: Negative for itching and rash.  Neurological: Positive for weakness. Negative for dizziness and focal weakness.  Endo/Heme/Allergies: Negative for polydipsia. Does not bruise/bleed easily.  Psychiatric/Behavioral: The patient is not nervous/anxious.      Vital Signs: Blood pressure 114/63, pulse 85, temperature 99.9 F (37.7 C), temperature source Oral, resp. rate 18, height '6\' 1"'$  (1.854 m), weight 81.6 kg, SpO2 (!) 89 %.  Weight trends: Filed Weights   02/08/20 0212  Weight: 81.6 kg    Physical Exam: General: NAD,   Head: Normocephalic, atraumatic.  Eyes: Anicteric, EOMI  Nose: Mucous membranes moist, not inflammed, nonerythematous.  Throat: Oropharynx nonerythematous, no exudate appreciated.   Neck: Supple, trachea midline.  Lungs:  Normal respiratory effort. Clear to auscultation BL without crackles or wheezes.  Heart: RRR. S1 and S2 normal without gallop, murmur, or rubs.  Abdomen:  BS normoactive. Soft, Nondistended, non-tender.  No masses or organomegaly.  Extremities: No pretibial edema.  Neurologic: A&O X3, Motor strength is 5/5 in the all 4 extremities  Skin: No visible rashes, scars.    Lab results: Basic Metabolic Panel: Recent Labs  Lab 02/08/20 0209  02/09/20 0012 02/10/20 0442  NA 140 142 139  K 4.0 3.7 3.7  CL 103 106 101  CO2 '28 28 26  '$ GLUCOSE 158* 117* 222*  BUN 48* 41* 37*  CREATININE 2.23* 2.14* 1.94*  CALCIUM 9.1 8.2* 8.3*  MG  --  1.7 1.6*    Liver Function Tests: No results for input(s): AST, ALT, ALKPHOS, BILITOT, PROT, ALBUMIN in the last 168 hours. No results for input(s): LIPASE, AMYLASE in the last 168 hours. No results for input(s): AMMONIA in the last 168 hours.  CBC: Recent Labs  Lab 02/08/20 0209 02/09/20 0012 02/10/20 0442  WBC 5.2 3.7* 7.7  NEUTROABS 3.7  --   --   HGB 9.9* 8.2* 8.6*  HCT 28.4* 23.5* 24.8*  MCV 92.5 92.9 92.5  PLT 121* 92* 97*    Cardiac Enzymes: No results for input(s): CKTOTAL, CKMB, CKMBINDEX, TROPONINI in the last 168 hours.  BNP: Invalid input(s): POCBNP  CBG: Recent Labs  Lab 02/09/20 1214 02/09/20 1659 02/09/20 2129 02/10/20 0801 02/10/20 1120  GLUCAP 162* 228* 221* 199* 158*    Microbiology: Results for orders placed or performed during the hospital encounter of 02/08/20  SARS Coronavirus 2 by RT PCR (hospital order, performed in [facility] hospital lab) Nasopharyngeal Nasopharyngeal Swab     Status: None   Collection Time: 02/08/20  5:26 AM   Specimen: Nasopharyngeal Swab  Result Value Ref Range Status   SARS Coronavirus 2 NEGATIVE NEGATIVE Final  Comment: (NOTE) SARS-CoV-2 target nucleic acids are NOT DETECTED.  The SARS-CoV-2 RNA is generally detectable in upper and lower respiratory specimens during the acute phase of infection. The lowest concentration of SARS-CoV-2 viral copies this assay can detect is 250 copies / mL. A negative result does not preclude SARS-CoV-2 infection and should not be used as the sole basis for treatment or other patient management decisions.  A negative result may occur with improper specimen collection / handling, submission of specimen other than nasopharyngeal swab, presence of viral mutation(s) within the areas  targeted by this assay, and inadequate number of viral copies (<250 copies / mL). A negative result must be combined with clinical observations, patient history, and epidemiological information.  Fact Sheet for Patients:   StrictlyIdeas.no  Fact Sheet for Healthcare Providers: BankingDealers.co.za  This test is not yet approved or  cleared by the [place] FDA and has been authorized for detection and/or diagnosis of SARS-CoV-2 by FDA under an Emergency Use Authorization (EUA).  This EUA will remain in effect (meaning this test can be used) for the duration of the COVID-19 declaration under Section 564(b)(1) of the Act, 21 U.S.C. section 360bbb-3(b)(1), unless the authorization is terminated or revoked sooner.  Performed at [facility], [address] [place], [place] [address]     Coagulation Studies: Recent Labs    02/08/20 0209  LABPROT 13.7  INR 1.1    Urinalysis: No results for input(s): COLORURINE, LABSPEC, PHURINE, GLUCOSEU, HGBUR, BILIRUBINUR, KETONESUR, PROTEINUR, UROBILINOGEN, NITRITE, LEUKOCYTESUR in the last 72 hours.  Invalid input(s): APPERANCEUR    Imaging:  No results found.   Assessment & Plan: Pt is a 74 y.o. male  with a PMHx of COPD, diabetes mellitus type 2, coronary artery disease, hypertension, thrombocytopenia, chronic kidney disease stage IIIb, who was admitted to [facility] on 02/08/2020 for evaluation of left greater than right lower extremity edema.  1.  Acute kidney injury/chronic kidney disease stage IIIb baseline EGFR 40. 2.  Diabetes mellitus type 2 with chronic kidney disease. 3.  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 4.  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  Creatinine down to 1.94 with an EGFR of 33.  We will discuss plans with diuretics with primary team.  Would suggest switching to  p.o. Lasix at 40 mg daily.  We will obtain renal ultrasound, SPEP, UPEP, ANA for additional work-up.  No indication for dialysis at the moment.

## 2020-02-10 NOTE — Care Management Important Message (Signed)
Important Message  Patient Details  Name: Rick Zuniga MRN: [id number] Date of Birth: 08/02/1946   Medicare Important Message Given:  Yes     Juliann Pulse A Jayant Kriz 02/10/2020, 9:52 AM

## 2020-02-10 NOTE — Progress Notes (Signed)
PROGRESS NOTE    Rick Zuniga  RSW:[id number] DOB: [birth year] DOA: 02/08/2020 PCP: [name], MD    Assessment & Plan:   Active Problems:   AKI (acute kidney injury) ([place])    Rick Zuniga  is a 74 y.o. Caucasian woman male with a known history of type 2 diabetes mellitus, hypertension, coronary artery disease, COPD and osteoarthritis, presented to the emergency room with acute onset of bilateral lower extremity pain and swelling which have been worsening over the last 5 days.  Prior to that the patient had an eye procedure that required IV contrast.  He had no recent long travels or other surgeries.  He stated that he stayed in a recliner all day yesterday.  He usually has good pain intolerance and was shouting in pain per his wife.  He has chronic diarrhea but denied any nausea or vomiting or abdominal pain.  No dysuria, oliguria or hematuria or flank pain.  He has been having chills and recent cold intolerance.  He admitted to recent dyspnea on exertion with orthopnea and paroxysmal nocturnal dyspnea with no chest pain or palpitations.   1.  Acute kidney injury CKD --Cr didn't improve much after MIVF.  Since pt has BLE swelling and DOE, pt may have congestion instead.  Cr improved with IV Lasix. PLAN: --IV lasix 40 mg x1 today, hold further aggressive diuretic per nephrology --Strict I/O --Nephrology consult today  2.  Left lower extremity DVT. -Uncertain chronicity.  Started on IV heparin on admission. PLAN: -continue Eliquis   3.  Dyspnea on exertion and orthopnea with bilateral lower extremity edema. Mild systolic CHF and Grade II diastolic dysfunction -Echo showed LVEF 40-45% -CXR showed "New left pleural effusion with associated airspace disease."   PLAN: --IV lasix 40 mg x1 today, hold further aggressive diuretic per nephrology --Strict I/O  4.  Hypertension. -Continue amlodipine, Coreg, Imdur and hold off Cozaar given acute kidney injury.  5.   Type 2 diabetes mellitus. Hypoglycemic episode -Had BG 27.  Pt's home med was listed as Novolin 70/30 10u BID, however, pt reported taking 5u BID. --d/c scheduled insulin --ACHS and SSI  6.  Coronary artery disease. -continue his Imdur and Coreg.  Fever of unclear source --up to 102.8.  No source yet.   --Procal 0.2 --blood cx x2 today --Monitor for now   DVT prophylaxis: [id number] Code Status: Full code  Family Communication: fiance updated at the bedside Status is: inpatient Dispo:   The patient is from: home Anticipated d/c is to: home Anticipated d/c date is: tomorrow Patient currently is not medically stable to d/c due to: getting IV diuresis for volume overload   Subjective and Interval History:  Pt had fever up to 102 last night, though he didn't feel it.  Pt developed abdominal pain and vomiting last night.  No diarrhea.  Has not had BM in 4 days, but didn't want laxatives.  No appetite.  Reported good urine output with diuretic.  LE swelling improved.  Pt reported still having spasmic pain in his left leg, but not as severe.  No dyspnea, chest pain, dysuria.     Objective: Vitals:   02/10/20 0444 02/10/20 0843 02/10/20 1214 02/10/20 1604  BP:  125/69 114/63 115/63  Pulse:  91 85 86  Resp:  17 18 16   Temp: 99.9 F (37.7 C) 99.8 F (37.7 C) 99.9 F (37.7 C) 99 F (37.2 C)  TempSrc: Oral Oral Oral Oral  SpO2:  (!) 89% (!) 89% Marland Kitchen)  82%  Weight:      Height:        Intake/Output Summary (Last 24 hours) at 02/10/2020 1822 Last data filed at 02/10/2020 1507 Gross per 24 hour  Intake 170 ml  Output 1250 ml  Net -1080 ml   Filed Weights   02/08/20 0212  Weight: 81.6 kg    Examination:   Constitutional: NAD, AAOx3, lying in bed, appeared more lethargic HEENT: conjunctivae and lids normal, EOMI CV: RRR no M,R,G. Distal pulses +2.  No cyanosis.   RESP: CTA B/L, normal respiratory effort  GI: +BS, NTND, soft Extremities: 2+ pitting edema in BLE SKIN:  warm, dry and intact Neuro: II - XII grossly intact.  Sensation intact Psych: depressed mood and affect.     Data Reviewed: I have personally reviewed following labs and imaging studies  CBC: Recent Labs  Lab 02/08/20 0209 02/09/20 0012 02/10/20 0442  WBC 5.2 3.7* 7.7  NEUTROABS 3.7  --   --   HGB 9.9* 8.2* 8.6*  HCT 28.4* 23.5* 24.8*  MCV 92.5 92.9 92.5  PLT 121* 92* 97*   Basic Metabolic Panel: Recent Labs  Lab 02/08/20 0209 02/09/20 0012 02/10/20 0442  NA 140 142 139  K 4.0 3.7 3.7  CL 103 106 101  CO2 28 28 26   GLUCOSE 158* 117* 222*  BUN 48* 41* 37*  CREATININE 2.23* 2.14* 1.94*  CALCIUM 9.1 8.2* 8.3*  MG  --  1.7 1.6*   GFR: Estimated Creatinine Clearance: 37.2 mL/min (A) (by C-G formula based on SCr of 1.94 mg/dL (H)). Liver Function Tests: No results for input(s): AST, ALT, ALKPHOS, BILITOT, PROT, ALBUMIN in the last 168 hours. No results for input(s): LIPASE, AMYLASE in the last 168 hours. No results for input(s): AMMONIA in the last 168 hours. Coagulation Profile: Recent Labs  Lab 02/08/20 0209  INR 1.1   Cardiac Enzymes: No results for input(s): CKTOTAL, CKMB, CKMBINDEX, TROPONINI in the last 168 hours. BNP (last 3 results) No results for input(s): PROBNP in the last 8760 hours. HbA1C: Recent Labs    02/08/20 0209  HGBA1C 9.3*   CBG: Recent Labs  Lab 02/09/20 1659 02/09/20 2129 02/10/20 0801 02/10/20 1120 02/10/20 1743  GLUCAP 228* 221* 199* 158* 165*   Lipid Profile: No results for input(s): CHOL, HDL, LDLCALC, TRIG, CHOLHDL, LDLDIRECT in the last 72 hours. Thyroid Function Tests: No results for input(s): TSH, T4TOTAL, FREET4, T3FREE, THYROIDAB in the last 72 hours. Anemia Panel: No results for input(s): VITAMINB12, FOLATE, FERRITIN, TIBC, IRON, RETICCTPCT in the last 72 hours. Sepsis Labs: Recent Labs  Lab 02/10/20 1024  PROCALCITON 0.20    Recent Results (from the past 240 hour(s))  SARS Coronavirus 2 by RT PCR (hospital  order, performed in [facility] hospital lab) Nasopharyngeal Nasopharyngeal Swab     Status: None   Collection Time: 02/08/20  5:26 AM   Specimen: Nasopharyngeal Swab  Result Value Ref Range Status   SARS Coronavirus 2 NEGATIVE NEGATIVE Final    Comment: (NOTE) SARS-CoV-2 target nucleic acids are NOT DETECTED.  The SARS-CoV-2 RNA is generally detectable in upper and lower respiratory specimens during the acute phase of infection. The lowest concentration of SARS-CoV-2 viral copies this assay can detect is 250 copies / mL. A negative result does not preclude SARS-CoV-2 infection and should not be used as the sole basis for treatment or other patient management decisions.  A negative result may occur with improper specimen collection / handling, submission of specimen other than  nasopharyngeal swab, presence of viral mutation(s) within the areas targeted by this assay, and inadequate number of viral copies (<250 copies / mL). A negative result must be combined with clinical observations, patient history, and epidemiological information.  Fact Sheet for Patients:   StrictlyIdeas.no  Fact Sheet for Healthcare Providers: BankingDealers.co.za  This test is not yet approved or  cleared by the [place] FDA and has been authorized for detection and/or diagnosis of SARS-CoV-2 by FDA under an Emergency Use Authorization (EUA).  This EUA will remain in effect (meaning this test can be used) for the duration of the COVID-19 declaration under Section 564(b)(1) of the Act, 21 U.S.C. section 360bbb-3(b)(1), unless the authorization is terminated or revoked sooner.  Performed at [facility], [address], [place], [place] [address]       Radiology Studies: No results found.   Scheduled Meds:  amLODipine  10 mg Oral Daily   apixaban  10 mg Oral BID   Followed by   Derrill Memo ON 02/16/2020] apixaban  5 mg Oral BID   aspirin  EC  81 mg Oral Daily   carvedilol  6.25 mg Oral BID WC   feeding supplement (ENSURE ENLIVE)  237 mL Oral BID BM   [START ON 02/11/2020] furosemide  40 mg Oral Daily   insulin aspart  0-5 Units Subcutaneous QHS   insulin aspart  0-9 Units Subcutaneous TID WC   isosorbide mononitrate  30 mg Oral Daily   Continuous Infusions:   LOS: 2 days     Enzo Bi, MD [facility] If 7PM-7AM, please contact night-coverage 02/10/2020, 6:22 PM

## 2020-02-10 NOTE — Progress Notes (Signed)
Pt temp 38.5 at 0001. Attempted to get pt to take 2200 tylenol that was refused; however, he continued to refuse. Temp rechecked at 0141-38.1. Pt continued to refuseTylenol. NP notified.

## 2020-02-11 ENCOUNTER — Inpatient Hospital Stay: Payer: Medicare Other

## 2020-02-11 LAB — FOLATE: Folate: 13.2 ng/mL (ref >5.9)

## 2020-02-11 LAB — URINALYSIS, COMPLETE (UACMP) WITH MICROSCOPIC
Bacteria, UA: NONE SEEN
Bilirubin Urine: NEGATIVE
Glucose, UA: NEGATIVE mg/dL
Ketones, ur: NEGATIVE mg/dL
Leukocytes,Ua: NEGATIVE
Nitrite: NEGATIVE
Protein, ur: 30 mg/dL — AB
Specific Gravity, Urine: 1.013 (ref 1.005–1.030)
Squamous Epithelial / HPF: NONE SEEN (ref 0–5)
pH: 5 (ref 5.0–8.0)

## 2020-02-11 LAB — BASIC METABOLIC PANEL
Anion gap: 9 (ref 5–15)
BUN: 48 mg/dL — ABNORMAL HIGH (ref 8–23)
CO2: 28 mmol/L (ref 22–32)
Calcium: 8.3 mg/dL — ABNORMAL LOW (ref 8.9–10.3)
Chloride: 98 mmol/L (ref 98–111)
Creatinine, Ser: 2.37 mg/dL — ABNORMAL HIGH (ref 0.61–1.24)
GFR calc Af Amer: 30 mL/min — ABNORMAL LOW (ref >60)
GFR calc non Af Amer: 26 mL/min — ABNORMAL LOW (ref >60)
Glucose, Bld: 298 mg/dL — ABNORMAL HIGH (ref 70–99)
Potassium: 3.6 mmol/L (ref 3.5–5.1)
Sodium: 135 mmol/L (ref 135–145)

## 2020-02-11 LAB — RETICULOCYTES
Immature Retic Fract: 14.3 % (ref 2.3–15.9)
RBC.: 2.74 MIL/uL — ABNORMAL LOW (ref 4.22–5.81)
Retic Count, Absolute: 31.2 10*3/uL (ref 19.0–186.0)
Retic Ct Pct: 1.1 % (ref 0.4–3.1)

## 2020-02-11 LAB — CBC
HCT: 24.9 % — ABNORMAL LOW (ref 39.0–52.0)
Hemoglobin: 9 g/dL — ABNORMAL LOW (ref 13.0–17.0)
MCH: 32.4 pg (ref 26.0–34.0)
MCHC: 36.1 g/dL — ABNORMAL HIGH (ref 30.0–36.0)
MCV: 89.6 fL (ref 80.0–100.0)
Platelets: 97 10*3/uL — ABNORMAL LOW (ref 150–400)
RBC: 2.78 MIL/uL — ABNORMAL LOW (ref 4.22–5.81)
RDW: 12.5 % (ref 11.5–15.5)
WBC: 7.8 10*3/uL (ref 4.0–10.5)
nRBC: 0 % (ref 0.0–0.2)

## 2020-02-11 LAB — GLUCOSE, CAPILLARY
Glucose-Capillary: 226 mg/dL — ABNORMAL HIGH (ref 70–99)
Glucose-Capillary: 246 mg/dL — ABNORMAL HIGH (ref 70–99)
Glucose-Capillary: 181 mg/dL — ABNORMAL HIGH (ref 70–99)
Glucose-Capillary: 189 mg/dL — ABNORMAL HIGH (ref 70–99)

## 2020-02-11 LAB — PROCALCITONIN: Procalcitonin: 0.3 ng/mL

## 2020-02-11 LAB — IRON AND TIBC
Iron: 15 ug/dL — ABNORMAL LOW (ref 45–182)
Saturation Ratios: 9 % — ABNORMAL LOW (ref 17.9–39.5)
TIBC: 167 ug/dL — ABNORMAL LOW (ref 250–450)
UIBC: 152 ug/dL

## 2020-02-11 LAB — MAGNESIUM: Magnesium: 2.1 mg/dL (ref 1.7–2.4)

## 2020-02-11 LAB — FERRITIN: Ferritin: 240 ng/mL (ref 24–336)

## 2020-02-11 MED ORDER — INSULIN ASPART PROT & ASPART (70-30 MIX) 100 UNIT/ML ~~LOC~~ SUSP
5.0000 [IU] | Freq: Two times a day (BID) | SUBCUTANEOUS | Status: DC
  Administered 2020-02-11 – 2020-02-15 (×9): 5 [IU] via SUBCUTANEOUS
  Filled 2020-02-11 (×9): qty 10

## 2020-02-11 MED ORDER — POLYETHYLENE GLYCOL 3350 17 G PO PACK
17.0000 g | PACK | Freq: Two times a day (BID) | ORAL | Status: DC
  Administered 2020-02-11 – 2020-02-14 (×7): 17 g via ORAL
  Filled 2020-02-11 (×8): qty 1

## 2020-02-11 MED ORDER — ORAL CARE MOUTH RINSE
15.0000 mL | Freq: Two times a day (BID) | OROMUCOSAL | Status: DC
  Administered 2020-02-11 – 2020-02-15 (×7): 15 mL via OROMUCOSAL

## 2020-02-11 NOTE — Progress Notes (Addendum)
PROGRESS NOTE    Rick Zuniga  QQP:[id number] DOB: 1946-08-07 DOA: 02/08/2020 PCP: [name], MD    Assessment & Plan:   Active Problems:   AKI (acute kidney injury) ([place])    Rick Zuniga  is a 74 y.o. Caucasian male with a known history of type 2 diabetes mellitus, hypertension, coronary artery disease, COPD and osteoarthritis, presented to the emergency room with acute onset of bilateral lower extremity pain and swelling which have been worsening over the last 5 days.  Prior to that the patient had an eye procedure that required IV contrast.  He had no recent long travels or other surgeries.  He stated that he stayed in a recliner all day yesterday.  He usually has good pain intolerance and was shouting in pain per his wife.  He has chronic diarrhea but denied any nausea or vomiting or abdominal pain.  No dysuria, oliguria or hematuria or flank pain.  He has been having chills and recent cold intolerance.  He admitted to recent dyspnea on exertion with orthopnea and paroxysmal nocturnal dyspnea with no chest pain or palpitations.   1.  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  Since pt has BLE swelling and DOE, pt may have congestion instead.  Cr improved with IV Lasix. --Nephrology consulted PLAN: --PO lasix 40 mg daily, hold further aggressive diuretic per nephrology --Strict I/O --renal US and renal labs  2.  Left lower extremity DVT. -Uncertain chronicity.  Started on IV heparin on admission. PLAN: -continue Eliquis   3.  Dyspnea on exertion and orthopnea with bilateral lower extremity edema. Mild systolic CHF and Grade II diastolic dysfunction -Echo showed LVEF 40-45% -CXR showed "New left pleural effusion with associated airspace disease."   PLAN: --PO lasix 40 mg daily, hold further aggressive diuretic per nephrology --Strict I/O  4.  Hypertension. -Continue amlodipine, Coreg, Imdur and hold off Cozaar given acute kidney  injury.  5.  Type 2 diabetes mellitus. Hypoglycemic episode -Had BG 27.  Pt's home med was listed as Novolin 70/30 10u BID, however, pt reported taking 5u BID. --d/c scheduled insulin --ACHS and SSI  6.  Coronary artery disease. -continue his Imdur and Coreg.  Fever of unclear source --up to 102.8.  2 nights in a row.  No source yet.   --Procal 0.2 --blood cx x2 obtained PLAN: --UA today, no signs of infection --V/Q scan to assess for PE (avoid contrast 2/2 AKI)  # N/V and abdominal pain --started night of 6/24.  No diarrhea.  No BM for at least 4 days. --KUB today, no acute finding --Miralax BID   DVT prophylaxis: [id number] Code Status: Full code  Family Communication: fiance updated at the bedside Status is: inpatient Dispo:   The patient is from: home Anticipated d/c is to: home Anticipated d/c date is: unclear Patient currently is not medically stable to d/c due to: still being worked up for fever of unknown origin.     Subjective and Interval History:  Pt had another fever last night, though mild, 100.7.  Today, pt continued to complain of abdominal pain, nausea, and not wanting to eat.  No dyspnea, chest pain, diarrhea, dysuria.  Still no BM yet.    Objective: Vitals:   02/10/20 2109 02/10/20 2326 02/11/20 0807 02/11/20 1224  BP: 124/67 111/64 124/71 115/66  Pulse: 89 81 91 80  Resp: 16  20 18   Temp: (!) 100.7 F (38.2 C) 99.1 F (37.3 C) 99.1  F (37.3 C) 98.5 F (36.9 C)  TempSrc: Oral Oral Oral Oral  SpO2: 93% 99% 97% 90%  Weight:      Height:        Intake/Output Summary (Last 24 hours) at 02/11/2020 1530 Last data filed at 02/11/2020 0716 Gross per 24 hour  Intake --  Output 350 ml  Net -350 ml   Filed Weights   02/08/20 0212  Weight: 81.6 kg    Examination:   Constitutional: NAD, AAOx3, lying in bed, appeared more lethargic HEENT: conjunctivae and lids normal, EOMI CV: RRR no M,R,G. Distal pulses +2.  No cyanosis.   RESP: CTA  B/L, normal respiratory effort  GI: +BS, NTND, soft Extremities: 2+ pitting edema in BLE SKIN: warm, dry and intact Neuro: II - XII grossly intact.  Sensation intact Psych: depressed mood and affect.     Data Reviewed: I have personally reviewed following labs and imaging studies  CBC: Recent Labs  Lab 02/08/20 0209 02/09/20 0012 02/10/20 0442 02/11/20 0945  WBC 5.2 3.7* 7.7 7.8  NEUTROABS 3.7  --   --   --   HGB 9.9* 8.2* 8.6* 9.0*  HCT 28.4* 23.5* 24.8* 24.9*  MCV 92.5 92.9 92.5 89.6  PLT 121* 92* 97* 97*   Basic Metabolic Panel: Recent Labs  Lab 02/08/20 0209 02/09/20 0012 02/10/20 0442 02/11/20 0945  NA 140 142 139 135  K 4.0 3.7 3.7 3.6  CL 103 106 101 98  CO2 28 28 26 28   GLUCOSE 158* 117* 222* 298*  BUN 48* 41* 37* 48*  CREATININE 2.23* 2.14* 1.94* 2.37*  CALCIUM 9.1 8.2* 8.3* 8.3*  MG  --  1.7 1.6* 2.1   GFR: Estimated Creatinine Clearance: 30.4 mL/min (A) (by C-G formula based on SCr of 2.37 mg/dL (H)). Liver Function Tests: No results for input(s): AST, ALT, ALKPHOS, BILITOT, PROT, ALBUMIN in the last 168 hours. No results for input(s): LIPASE, AMYLASE in the last 168 hours. No results for input(s): AMMONIA in the last 168 hours. Coagulation Profile: Recent Labs  Lab 02/08/20 0209  INR 1.1   Cardiac Enzymes: No results for input(s): CKTOTAL, CKMB, CKMBINDEX, TROPONINI in the last 168 hours. BNP (last 3 results) No results for input(s): PROBNP in the last 8760 hours. HbA1C: No results for input(s): HGBA1C in the last 72 hours. CBG: Recent Labs  Lab 02/10/20 1120 02/10/20 1743 02/10/20 2053 02/11/20 0809 02/11/20 1223  GLUCAP 158* 165* 187* 226* 246*   Lipid Profile: No results for input(s): CHOL, HDL, LDLCALC, TRIG, CHOLHDL, LDLDIRECT in the last 72 hours. Thyroid Function Tests: No results for input(s): TSH, T4TOTAL, FREET4, T3FREE, THYROIDAB in the last 72 hours. Anemia Panel: No results for input(s): VITAMINB12, FOLATE, FERRITIN,  TIBC, IRON, RETICCTPCT in the last 72 hours. Sepsis Labs: Recent Labs  Lab 02/10/20 1024 02/11/20 0945  PROCALCITON 0.20 0.30    Recent Results (from the past 240 hour(s))  SARS Coronavirus 2 by RT PCR (hospital order, performed in [facility] hospital lab) Nasopharyngeal Nasopharyngeal Swab     Status: None   Collection Time: 02/08/20  5:26 AM   Specimen: Nasopharyngeal Swab  Result Value Ref Range Status   SARS Coronavirus 2 NEGATIVE NEGATIVE Final    Comment: (NOTE) SARS-CoV-2 target nucleic acids are NOT DETECTED.  The SARS-CoV-2 RNA is generally detectable in upper and lower respiratory specimens during the acute phase of infection. The lowest concentration of SARS-CoV-2 viral copies this assay can detect is 250 copies / mL. A negative  result does not preclude SARS-CoV-2 infection and should not be used as the sole basis for treatment or other patient management decisions.  A negative result may occur with improper specimen collection / handling, submission of specimen other than nasopharyngeal swab, presence of viral mutation(s) within the areas targeted by this assay, and inadequate number of viral copies (<250 copies / mL). A negative result must be combined with clinical observations, patient history, and epidemiological information.  Fact Sheet for Patients:   StrictlyIdeas.no  Fact Sheet for Healthcare Providers: BankingDealers.co.za  This test is not yet approved or  cleared by the [place] FDA and has been authorized for detection and/or diagnosis of SARS-CoV-2 by FDA under an Emergency Use Authorization (EUA).  This EUA will remain in effect (meaning this test can be used) for the duration of the COVID-19 declaration under Section 564(b)(1) of the Act, 21 U.S.C. section 360bbb-3(b)(1), unless the authorization is terminated or revoked sooner.  Performed at [facility], [address]  [place], [place] [address]   Culture, blood (Routine X 2) w Reflex to ID Panel     Status: None (Preliminary result)   Collection Time: 02/10/20 10:24 AM   Specimen: BLOOD  Result Value Ref Range Status   Specimen Description BLOOD LEFT ANTECUBITAL  Final   Special Requests   Final    BOTTLES DRAWN AEROBIC AND ANAEROBIC Blood Culture adequate volume   Culture   Final    NO GROWTH < 24 HOURS Performed at [facility], [address] [place], [place] [address]    Report Status PENDING  Incomplete  Culture, blood (Routine X 2) w Reflex to ID Panel     Status: None (Preliminary result)   Collection Time: 02/10/20 10:30 AM   Specimen: BLOOD  Result Value Ref Range Status   Specimen Description BLOOD BLOOD LEFT HAND  Final   Special Requests   Final    BOTTLES DRAWN AEROBIC AND ANAEROBIC Blood Culture results may not be optimal due to an excessive volume of blood received in culture bottles   Culture   Final    NO GROWTH < 24 HOURS Performed at [facility], [address] [place], [place] [address]    Report Status PENDING  Incomplete      Radiology Studies: DG Abd 1 View  Result Date: 02/11/2020 CLINICAL DATA:  Abdominal pain and nausea for 1 week. EXAM: ABDOMEN - 1 VIEW COMPARISON:  CT, 05/25/2019 FINDINGS: Normal bowel gas pattern. No evidence of renal or ureteral stones. Arterial vascular calcifications are noted in the pelvis and along the femoral arteries. Right upper quadrant surgical vascular clips from a prior cholecystectomy. Soft tissues are otherwise unremarkable. No acute skeletal abnormality. There is opacity at the left lung base, incompletely visualized. IMPRESSION: 1. No acute findings.  Normal bowel gas pattern. Electronically Signed   By: Lajean Manes M.D.   On: 02/11/2020 15:20   US RENAL  Result Date: 02/11/2020 CLINICAL DATA:  Acute tubular necrosis. EXAM: RENAL / URINARY TRACT ULTRASOUND COMPLETE COMPARISON:  None. FINDINGS: Right Kidney: Renal  measurements: 11.0 x 4.9 x 5.6 cm = volume: 157.8 mL. Mild increased parenchymal echogenicity. No mass, stone or hydronephrosis. Left Kidney: Renal measurements: 11.2 x 5.2 x 5.1 cm = volume: 156.3 mL. Mild increased parenchymal echogenicity. No mass, stone or hydronephrosis. Bladder: Mild bladder wall thickening and irregularity. No bladder mass or stone. Pre void volume 334.2 mL. Patient was unable to void. Other: None. IMPRESSION: 1. Mild bladder  wall thickening. Consider cystitis in the proper clinical setting. This could be due to chronic bladder outlet obstruction. 2. Mild increased bilateral renal parenchymal echogenicity consistent with medical renal disease. No masses, stones or hydronephrosis. Electronically Signed   By: Lajean Manes M.D.   On: 02/11/2020 12:28     Scheduled Meds:  amLODipine  10 mg Oral Daily   apixaban  10 mg Oral BID   Followed by   Derrill Memo ON 02/16/2020] apixaban  5 mg Oral BID   aspirin EC  81 mg Oral Daily   carvedilol  6.25 mg Oral BID WC   feeding supplement (ENSURE ENLIVE)  237 mL Oral BID BM   furosemide  40 mg Oral Daily   insulin aspart  0-9 Units Subcutaneous TID WC   insulin aspart protamine- aspart  5 Units Subcutaneous BID WC   isosorbide mononitrate  30 mg Oral Daily   mouth rinse  15 mL Mouth Rinse BID   polyethylene glycol  17 g Oral BID   Continuous Infusions:   LOS: 3 days     Enzo Bi, MD [facility] If 7PM-7AM, please contact night-coverage 02/11/2020, 3:30 PM

## 2020-02-11 NOTE — Progress Notes (Signed)
Rick Zuniga  MRN: [id number]  DOB/AGE: [birth year] 74 y.o.  Primary Care Physician:Johnston, Chrystie Nose, MD  Admit date: 02/08/2020  Chief Complaint:  Chief Complaint  Patient presents with  . Leg Swelling  . Leg Pain    S-Pt presented on  02/08/2020 with  Chief Complaint  Patient presents with  . Leg Swelling  . Leg Pain  . Pt main concern was " I do not have energy"   Medications . amLODipine  10 mg Oral Daily  . apixaban  10 mg Oral BID   Followed by  . [START ON 02/16/2020] apixaban  5 mg Oral BID  . aspirin EC  81 mg Oral Daily  . carvedilol  6.25 mg Oral BID WC  . feeding supplement (ENSURE ENLIVE)  237 mL Oral BID BM  . furosemide  40 mg Oral Daily  . insulin aspart  0-9 Units Subcutaneous TID WC  . insulin aspart protamine- aspart  5 Units Subcutaneous BID WC  . isosorbide mononitrate  30 mg Oral Daily  . mouth rinse  15 mL Mouth Rinse BID  . polyethylene glycol  17 g Oral BID         SWH:QPRFF from the symptoms mentioned above,there are no other symptoms referable to all systems reviewed.  Physical Exam: Vital signs in last 24 hours: Temp:  [98.5 F (36.9 C)-100.7 F (38.2 C)] 98.5 F (36.9 C) (06/26 1224) Pulse Rate:  [80-91] 80 (06/26 1224) Resp:  [16-20] 18 (06/26 1224) BP: (111-124)/(63-71) 115/66 (06/26 1224) SpO2:  [82 %-99 %] 90 % (06/26 1224) Weight change:  Last BM Date: 02/08/20 (per chart)  Intake/Output from previous day: 06/25 0701 - 06/26 0700 In: 170 [P.O.:120; IV Piggyback:50] Out: 825 [Urine:825] Total I/O In: -  Out: 350 [Urine:350]   Physical Exam: General- pt is awake,alert, oriented to time place and person Resp- No acute REsp distress, BS decreased at bases CVS- S1S2 regular in rate and rhythm GIT- BS+, soft, NT, ND EXT- 2+  LE Edema, no Cyanosis   Lab Results: CBC Recent Labs    02/10/20 0442 02/11/20 0945  WBC 7.7 7.8  HGB 8.6* 9.0*  HCT 24.8* 24.9*  PLT 97* 97*    BMET Recent Labs     02/10/20 0442 02/11/20 0945  NA 139 135  K 3.7 3.6  CL 101 98  CO2 26 28  GLUCOSE 222* 298*  BUN 37* 48*  CREATININE 1.94* 2.37*  CALCIUM 8.3* 8.3*   Creatinine trend 2021  2.23==> 1.94==>2.3 2020  1.3 2019  1.3    MICRO Recent Results (from the past 240 hour(s))  SARS Coronavirus 2 by RT PCR (hospital order, performed in [facility] hospital lab) Nasopharyngeal Nasopharyngeal Swab     Status: None   Collection Time: 02/08/20  5:26 AM   Specimen: Nasopharyngeal Swab  Result Value Ref Range Status   SARS Coronavirus 2 NEGATIVE NEGATIVE Final    Comment: (NOTE) SARS-CoV-2 target nucleic acids are NOT DETECTED.  The SARS-CoV-2 RNA is generally detectable in upper and lower respiratory specimens during the acute phase of infection. The lowest concentration of SARS-CoV-2 viral copies this assay can detect is 250 copies / mL. A negative result does not preclude SARS-CoV-2 infection and should not be used as the sole basis for treatment or other patient management decisions.  A negative result may occur with improper specimen collection / handling, submission of specimen other than nasopharyngeal swab, presence of viral mutation(s) within the areas targeted by this  assay, and inadequate number of viral copies (<250 copies / mL). A negative result must be combined with clinical observations, patient history, and epidemiological information.  Fact Sheet for Patients:   StrictlyIdeas.no  Fact Sheet for Healthcare Providers: BankingDealers.co.za  This test is not yet approved or  cleared by the [place] FDA and has been authorized for detection and/or diagnosis of SARS-CoV-2 by FDA under an Emergency Use Authorization (EUA).  This EUA will remain in effect (meaning this test can be used) for the duration of the COVID-19 declaration under Section 564(b)(1) of the Act, 21 U.S.C. section 360bbb-3(b)(1), unless the authorization  is terminated or revoked sooner.  Performed at [facility], [address] [place], [place] [address]   Culture, blood (Routine X 2) w Reflex to ID Panel     Status: None (Preliminary result)   Collection Time: 02/10/20 10:24 AM   Specimen: BLOOD  Result Value Ref Range Status   Specimen Description BLOOD LEFT ANTECUBITAL  Final   Special Requests   Final    BOTTLES DRAWN AEROBIC AND ANAEROBIC Blood Culture adequate volume   Culture   Final    NO GROWTH < 24 HOURS Performed at [facility], [address], [place], [place] [address]    Report Status PENDING  Incomplete  Culture, blood (Routine X 2) w Reflex to ID Panel     Status: None (Preliminary result)   Collection Time: 02/10/20 10:30 AM   Specimen: BLOOD  Result Value Ref Range Status   Specimen Description BLOOD BLOOD LEFT HAND  Final   Special Requests   Final    BOTTLES DRAWN AEROBIC AND ANAEROBIC Blood Culture results may not be optimal due to an excessive volume of blood received in culture bottles   Culture   Final    NO GROWTH < 24 HOURS Performed at [facility], [address], [place], [place] [address]    Report Status PENDING  Incomplete      Lab Results  Component Value Date   CALCIUM 8.3 (L) 02/11/2020     CT abdomen done in September 2020 Adrenals/Urinary Tract: Normal adrenal glands. No hydronephrosis or perinephric edema. Homogeneous renal enhancement with symmetric excretion on delayed phase imaging. Urinary bladder partially distended but diffusely thick walled. Questionable small bladder diverticulum at the dome.   Reproductive: Enlarged prostate gland spanning 5.7 cm transverse.   IMPRESSION: 1. No acute abnormality or explanation for right lower quadrant pain. 2. Moderate colonic stool burden with fecalization of distal small bowel contents, can be seen with constipation. 3. Incidental duodenal diverticulum. 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 4. Enlarged prostate gland. 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 No perivesicular edema.   2D echo done on February 08, 2020 showed 1. Left ventricular ejection fraction, by estimation, is 40 to 45%. The  left ventricle has mildly decreased function. The left ventricle  demonstrates global hypokinesis. The left ventricular internal cavity size  was mildly to moderately dilated. Left  ventricular diastolic parameters are consistent with Grade II diastolic  dysfunction (pseudonormalization).   Renal ultrasound done 06.26.21 Right Kidney:  Renal measurements: 11.0 x 4.9 x 5.6 cm = volume: 157.8 mL. Mild increased parenchymal echogenicity. No mass, stone or hydronephrosis.  Left Kidney:  Renal measurements: 11.2 x 5.2 x 5.1 cm = volume: 156.3 mL. Mild increased parenchymal echogenicity.  No mass, stone or hydronephrosis.  Bladder:  Mild bladder wall thickening and irregularity. No bladder mass or stone. Pre void volume 334.2 mL. Patient was unable to void.  Other:  None.  IMPRESSION: 1. Mild bladder wall thickening. Consider cystitis in the proper clinical setting. This could be due to chronic bladder outlet obstruction. 2. Mild increased bilateral renal parenchymal echogenicity consistent with medical renal disease. No masses, stones or hydronephrosis.   Impression:  Patient is a 74 year old Caucasian male with a past medical history of COPD, diabetes mellitus type 2, coronary artery disease, hypertension, thrombocytopenia, CKD stage IIIb who was admitted to the hospital on June 23 with chief complaint of lower extremity edema  1)Renal  AKI secondary to ATN                AKI on CKD               CKD stage 3.               CKD since 2019               CKD secondary to                   Diabetes mellitus/hypertension obstructive uropathy/possible contribution from cardiorenal syndrome                Progression of CKD marked with AKI                 Patient AKI is worsening, patient creatinine is gone up from 1.9  To 2.3                   2)HTN blood pressure is at goal  Medication- On Diuretics  on Calcium Channel Blockers On Alpha and beta Blockers  On Vasodilators-    3)Anemia of chronic disease  HGb at goal (9--11)   4) secondary hyperparathyroidism -CKD Mineral-Bone Disorder   Secondary Hyperparathyroidism w/u pending.    5) combined systolic and diastolic CHF. Patient is now 2.5 L negative Patient is now better  6) electrolytes   sodium Normonatremic   potassium Normokalemic    7)Acid base Co2 at goal     Plan:  I asked for renal u/s which showed bladder wall thickening. Then I asked for bladder scan ,pt had more than 600 ml in bladder then I asked for  postvoid residual. PVR was 345 ml.  Will suggest to ask for intermitted foley  We will suggest to ask for UA, urine culture will follow Chem-7  I had extensive discussion with pt and  his family about his kidney relate issues   Traeton Bordas s Theador Hawthorne 02/11/2020, 2:00 PM

## 2020-02-11 NOTE — Progress Notes (Signed)
Patient is  Taken  for ultrasound at  This time noted.

## 2020-02-11 NOTE — Progress Notes (Signed)
Patient is transported to get Abdominal X-ray done at this time

## 2020-02-12 ENCOUNTER — Inpatient Hospital Stay: Payer: Medicare Other

## 2020-02-12 LAB — CBC
HCT: 25 % — ABNORMAL LOW (ref 39.0–52.0)
Hemoglobin: 8.6 g/dL — ABNORMAL LOW (ref 13.0–17.0)
MCH: 31.9 pg (ref 26.0–34.0)
MCHC: 34.4 g/dL (ref 30.0–36.0)
MCV: 92.6 fL (ref 80.0–100.0)
Platelets: 102 10*3/uL — ABNORMAL LOW (ref 150–400)
RBC: 2.7 MIL/uL — ABNORMAL LOW (ref 4.22–5.81)
RDW: 12.5 % (ref 11.5–15.5)
WBC: 6.6 10*3/uL (ref 4.0–10.5)
nRBC: 0 % (ref 0.0–0.2)

## 2020-02-12 LAB — PHOSPHORUS: Phosphorus: 4 mg/dL (ref 2.5–4.6)

## 2020-02-12 LAB — BASIC METABOLIC PANEL
Anion gap: 9 (ref 5–15)
BUN: 51 mg/dL — ABNORMAL HIGH (ref 8–23)
CO2: 29 mmol/L (ref 22–32)
Calcium: 8.3 mg/dL — ABNORMAL LOW (ref 8.9–10.3)
Chloride: 99 mmol/L (ref 98–111)
Creatinine, Ser: 2.56 mg/dL — ABNORMAL HIGH (ref 0.61–1.24)
GFR calc Af Amer: 27 mL/min — ABNORMAL LOW (ref >60)
GFR calc non Af Amer: 24 mL/min — ABNORMAL LOW (ref >60)
Glucose, Bld: 147 mg/dL — ABNORMAL HIGH (ref 70–99)
Potassium: 3.7 mmol/L (ref 3.5–5.1)
Sodium: 137 mmol/L (ref 135–145)

## 2020-02-12 LAB — GLUCOSE, CAPILLARY
Glucose-Capillary: 165 mg/dL — ABNORMAL HIGH (ref 70–99)
Glucose-Capillary: 161 mg/dL — ABNORMAL HIGH (ref 70–99)
Glucose-Capillary: 210 mg/dL — ABNORMAL HIGH (ref 70–99)
Glucose-Capillary: 140 mg/dL — ABNORMAL HIGH (ref 70–99)

## 2020-02-12 LAB — HEPATITIS PANEL, ACUTE
HCV Ab: NONREACTIVE
Hep A IgM: NONREACTIVE
Hep B C IgM: NONREACTIVE
Hepatitis B Surface Ag: NONREACTIVE

## 2020-02-12 LAB — PROCALCITONIN: Procalcitonin: 0.32 ng/mL

## 2020-02-12 LAB — VITAMIN B12: Vitamin B-12: 309 pg/mL (ref 180–914)

## 2020-02-12 LAB — MAGNESIUM: Magnesium: 2.2 mg/dL (ref 1.7–2.4)

## 2020-02-12 MED ORDER — FERROUS SULFATE 325 (65 FE) MG PO TABS
325.0000 mg | ORAL_TABLET | Freq: Two times a day (BID) | ORAL | Status: DC
  Administered 2020-02-13 – 2020-02-15 (×5): 325 mg via ORAL
  Filled 2020-02-12 (×5): qty 1

## 2020-02-12 MED ORDER — SODIUM CHLORIDE 0.9 % IV SOLN
1.5000 g | Freq: Four times a day (QID) | INTRAVENOUS | Status: DC
  Administered 2020-02-12 – 2020-02-13 (×3): 1.5 g via INTRAVENOUS
  Filled 2020-02-12 (×2): qty 1.5
  Filled 2020-02-12 (×2): qty 4
  Filled 2020-02-12: qty 1.5
  Filled 2020-02-12: qty 4

## 2020-02-12 MED ORDER — OXYCODONE HCL 5 MG PO TABS
5.0000 mg | ORAL_TABLET | Freq: Four times a day (QID) | ORAL | Status: DC | PRN
  Administered 2020-02-12 – 2020-02-15 (×5): 5 mg via ORAL
  Filled 2020-02-12 (×5): qty 1

## 2020-02-12 MED ORDER — SODIUM CHLORIDE 0.9 % IV SOLN
INTRAVENOUS | Status: DC | PRN
  Administered 2020-02-12: 17:00:00 250 mL via INTRAVENOUS

## 2020-02-12 NOTE — Progress Notes (Signed)
PROGRESS NOTE    Rick Zuniga  DXI:[id number] DOB: May 27, 1946 DOA: 02/08/2020 PCP: [name], MD    Assessment & Plan:   Active Problems:   AKI (acute kidney injury) ([place])    Rick Zuniga  is a 74 y.o. Caucasian male with a known history of type 2 diabetes mellitus, hypertension, coronary artery disease, COPD and osteoarthritis, presented to the emergency room with acute onset of bilateral lower extremity pain and swelling which have been worsening over the last 5 days.  Prior to that the patient had an eye procedure that required IV contrast.  He had no recent long travels or other surgeries.  He stated that he stayed in a recliner all day yesterday.  He usually has good pain intolerance and was shouting in pain per his wife.  He has chronic diarrhea but denied any nausea or vomiting or abdominal pain.  No dysuria, oliguria or hematuria or flank pain.  He has been having chills and recent cold intolerance.  He admitted to recent dyspnea on exertion with orthopnea and paroxysmal nocturnal dyspnea with no chest pain or palpitations.   1.  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  Since pt has BLE swelling and DOE, pt may have congestion instead.  Cr improved with IV Lasix. --Nephrology consulted PLAN: --PO lasix 40 mg daily, hold further aggressive diuretic per nephrology --Strict I/O --renal US and renal labs  2.  Left lower extremity DVT. -Uncertain chronicity.  Started on IV heparin on admission, transitioned to Eliquis PLAN: -continue Eliquis   3.  Dyspnea on exertion and orthopnea with bilateral lower extremity edema. Mild systolic CHF and Grade II diastolic dysfunction -Echo showed LVEF 40-45% -CXR showed "New left pleural effusion with associated airspace disease."   PLAN: --PO lasix 40 mg daily, hold further aggressive diuretic per nephrology --Strict I/O  4.  Hypertension. -Continue amlodipine, Coreg, Imdur and hold off  Cozaar given acute kidney injury.  5.  Type 2 diabetes mellitus. Hypoglycemic episode -Had BG 27.  Pt's home med was listed as Novolin 70/30 10u BID, however, pt reported taking 5u BID. PLAN: --continue 70/30 as 5u BID --ACHS and SSI  6.  Coronary artery disease. -continue his Imdur and Coreg.  Fever of unclear source --up to 102.8.  2 nights in a row.   --Procal 0.2 --blood cx x2 neg so far --UA neg, but urine cx grew Klebsiella and Enterococcus, sensitivities pending PLAN: --Start Unasyn today  --V/Q scan to assess for PE (avoid contrast 2/2 AKI)  # N/V and abdominal pain, resolved --started night of 6/24.  No diarrhea.  No BM for at least 4 days. --KUB 6/26, no acute finding --Miralax BID   DVT prophylaxis: JQ:[id number] Code Status: Full code  Family Communication: fiance updated at the bedside Status is: inpatient Dispo:   The patient is from: home Anticipated d/c is to: home Anticipated d/c date is: unclear Patient currently is not medically stable to d/c due to: still being worked up for fever of unknown origin, and worsening AKI managed by nephrology.      Subjective and Interval History:  Pt had another fever last night, though mild, 100.7.  Today, pt continued to complain of abdominal pain, nausea, and not wanting to eat.  No dyspnea, chest pain, diarrhea, dysuria.  Still no BM yet.    Objective: Vitals:   02/11/20 2052 02/12/20 0112 02/12/20 0846 02/12/20 1204  BP: 116/62 121/62 (!) 147/67 117/66  Pulse:  77 85 91 80  Resp: 16 16 20 18   Temp: 98.6 F (37 C) 98.1 F (36.7 C) 99.3 F (37.4 C) 98.4 F (36.9 C)  TempSrc: Oral  Oral Oral  SpO2: 91% (!) 82% 91% 92%  Weight:      Height:        Intake/Output Summary (Last 24 hours) at 02/12/2020 1344 Last data filed at 02/11/2020 1545 Gross per 24 hour  Intake --  Output 425 ml  Net -425 ml   Filed Weights   02/08/20 0212  Weight: 81.6 kg    Examination:   Constitutional: NAD, AAOx3, lying  in bed, appeared more lethargic HEENT: conjunctivae and lids normal, EOMI CV: RRR no M,R,G. Distal pulses +2.  No cyanosis.   RESP: CTA B/L, normal respiratory effort  GI: +BS, NTND, soft Extremities: 2+ pitting edema in BLE SKIN: warm, dry and intact Neuro: II - XII grossly intact.  Sensation intact Psych: depressed mood and affect.     Data Reviewed: I have personally reviewed following labs and imaging studies  CBC: Recent Labs  Lab 02/08/20 0209 02/09/20 0012 02/10/20 0442 02/11/20 0945 02/12/20 0445  WBC 5.2 3.7* 7.7 7.8 6.6  NEUTROABS 3.7  --   --   --   --   HGB 9.9* 8.2* 8.6* 9.0* 8.6*  HCT 28.4* 23.5* 24.8* 24.9* 25.0*  MCV 92.5 92.9 92.5 89.6 92.6  PLT 121* 92* 97* 97* 284*   Basic Metabolic Panel: Recent Labs  Lab 02/08/20 0209 02/09/20 0012 02/10/20 0442 02/11/20 0945 02/12/20 0445 02/12/20 0752  NA 140 142 139 135 137  --   K 4.0 3.7 3.7 3.6 3.7  --   CL 103 106 101 98 99  --   CO2 28 28 26 28 29   --   GLUCOSE 158* 117* 222* 298* 147*  --   BUN 48* 41* 37* 48* 51*  --   CREATININE 2.23* 2.14* 1.94* 2.37* 2.56*  --   CALCIUM 9.1 8.2* 8.3* 8.3* 8.3*  --   MG  --  1.7 1.6* 2.1 2.2  --   PHOS  --   --   --   --   --  4.0   GFR: Estimated Creatinine Clearance: 28.2 mL/min (A) (by C-G formula based on SCr of 2.56 mg/dL (H)). Liver Function Tests: No results for input(s): AST, ALT, ALKPHOS, BILITOT, PROT, ALBUMIN in the last 168 hours. No results for input(s): LIPASE, AMYLASE in the last 168 hours. No results for input(s): AMMONIA in the last 168 hours. Coagulation Profile: Recent Labs  Lab 02/08/20 0209  INR 1.1   Cardiac Enzymes: No results for input(s): CKTOTAL, CKMB, CKMBINDEX, TROPONINI in the last 168 hours. BNP (last 3 results) No results for input(s): PROBNP in the last 8760 hours. HbA1C: No results for input(s): HGBA1C in the last 72 hours. CBG: Recent Labs  Lab 02/11/20 1223 02/11/20 1640 02/11/20 2052 02/12/20 0844  02/12/20 1159  GLUCAP 246* 181* 189* 165* 161*   Lipid Profile: No results for input(s): CHOL, HDL, LDLCALC, TRIG, CHOLHDL, LDLDIRECT in the last 72 hours. Thyroid Function Tests: No results for input(s): TSH, T4TOTAL, FREET4, T3FREE, THYROIDAB in the last 72 hours. Anemia Panel: Recent Labs    02/11/20 1559  VITAMINB12 309  FOLATE 13.2  FERRITIN 240  TIBC 167*  IRON 15*  RETICCTPCT 1.1   Sepsis Labs: Recent Labs  Lab 02/10/20 1024 02/11/20 0945 02/12/20 0445  PROCALCITON 0.20 0.30 0.32    Recent Results (  from the past 240 hour(s))  SARS Coronavirus 2 by RT PCR (hospital order, performed in [facility] hospital lab) Nasopharyngeal Nasopharyngeal Swab     Status: None   Collection Time: 02/08/20  5:26 AM   Specimen: Nasopharyngeal Swab  Result Value Ref Range Status   SARS Coronavirus 2 NEGATIVE NEGATIVE Final    Comment: (NOTE) SARS-CoV-2 target nucleic acids are NOT DETECTED.  The SARS-CoV-2 RNA is generally detectable in upper and lower respiratory specimens during the acute phase of infection. The lowest concentration of SARS-CoV-2 viral copies this assay can detect is 250 copies / mL. A negative result does not preclude SARS-CoV-2 infection and should not be used as the sole basis for treatment or other patient management decisions.  A negative result may occur with improper specimen collection / handling, submission of specimen other than nasopharyngeal swab, presence of viral mutation(s) within the areas targeted by this assay, and inadequate number of viral copies (<250 copies / mL). A negative result must be combined with clinical observations, patient history, and epidemiological information.  Fact Sheet for Patients:   StrictlyIdeas.no  Fact Sheet for Healthcare Providers: BankingDealers.co.za  This test is not yet approved or  cleared by the [place] FDA and has been authorized for detection and/or  diagnosis of SARS-CoV-2 by FDA under an Emergency Use Authorization (EUA).  This EUA will remain in effect (meaning this test can be used) for the duration of the COVID-19 declaration under Section 564(b)(1) of the Act, 21 U.S.C. section 360bbb-3(b)(1), unless the authorization is terminated or revoked sooner.  Performed at [facility], [address] [place], [place] [address]   Culture, blood (Routine X 2) w Reflex to ID Panel     Status: None (Preliminary result)   Collection Time: 02/10/20 10:24 AM   Specimen: BLOOD  Result Value Ref Range Status   Specimen Description BLOOD LEFT ANTECUBITAL  Final   Special Requests   Final    BOTTLES DRAWN AEROBIC AND ANAEROBIC Blood Culture adequate volume   Culture   Final    NO GROWTH 2 DAYS Performed at [facility], [address], [place], [place] [address]    Report Status PENDING  Incomplete  Culture, blood (Routine X 2) w Reflex to ID Panel     Status: None (Preliminary result)   Collection Time: 02/10/20 10:30 AM   Specimen: BLOOD  Result Value Ref Range Status   Specimen Description BLOOD BLOOD LEFT HAND  Final   Special Requests   Final    BOTTLES DRAWN AEROBIC AND ANAEROBIC Blood Culture results may not be optimal due to an excessive volume of blood received in culture bottles   Culture   Final    NO GROWTH 2 DAYS Performed at [facility], [address] [place], [place] [address]    Report Status PENDING  Incomplete      Radiology Studies: DG Abd 1 View  Result Date: 02/11/2020 CLINICAL DATA:  Abdominal pain and nausea for 1 week. EXAM: ABDOMEN - 1 VIEW COMPARISON:  CT, 05/25/2019 FINDINGS: Normal bowel gas pattern. No evidence of renal or ureteral stones. Arterial vascular calcifications are noted in the pelvis and along the femoral arteries. Right upper quadrant surgical vascular clips from a prior cholecystectomy. Soft tissues are otherwise unremarkable. No acute skeletal abnormality.  There is opacity at the left lung base, incompletely visualized. IMPRESSION: 1. No acute findings.  Normal bowel gas pattern. Electronically Signed   By: Dedra Skeens.D.  On: 02/11/2020 15:20   US RENAL  Result Date: 02/11/2020 CLINICAL DATA:  Acute tubular necrosis. EXAM: RENAL / URINARY TRACT ULTRASOUND COMPLETE COMPARISON:  None. FINDINGS: Right Kidney: Renal measurements: 11.0 x 4.9 x 5.6 cm = volume: 157.8 mL. Mild increased parenchymal echogenicity. No mass, stone or hydronephrosis. Left Kidney: Renal measurements: 11.2 x 5.2 x 5.1 cm = volume: 156.3 mL. Mild increased parenchymal echogenicity. No mass, stone or hydronephrosis. Bladder: Mild bladder wall thickening and irregularity. No bladder mass or stone. Pre void volume 334.2 mL. Patient was unable to void. Other: None. IMPRESSION: 1. Mild bladder wall thickening. Consider cystitis in the proper clinical setting. This could be due to chronic bladder outlet obstruction. 2. Mild increased bilateral renal parenchymal echogenicity consistent with medical renal disease. No masses, stones or hydronephrosis. Electronically Signed   By: Lajean Manes M.D.   On: 02/11/2020 12:28   DG Chest Port 1 View  Result Date: 02/12/2020 CLINICAL DATA:  Hypoxia, acute renal failure, COPD, diabetes mellitus, hypertension, DVT, coronary artery disease post CABG EXAM: PORTABLE CHEST 1 VIEW COMPARISON:  Portable exam 1003 hours compared to 02/08/2020 FINDINGS: Minimal enlargement of cardiac silhouette post CABG. Mediastinal contours and pulmonary vascularity normal. LEFT pleural effusion and basilar atelectasis. Questionable tiny RIGHT pleural effusion. No pneumothorax or segmental consolidation. Osseous structures demineralized. IMPRESSION: Minimal enlargement of cardiac silhouette post CABG. LEFT pleural effusion and basilar atelectasis. Electronically Signed   By: Lavonia Dana M.D.   On: 02/12/2020 10:28     Scheduled Meds:  amLODipine  10 mg Oral Daily    apixaban  10 mg Oral BID   Followed by   Derrill Memo ON 02/16/2020] apixaban  5 mg Oral BID   aspirin EC  81 mg Oral Daily   carvedilol  6.25 mg Oral BID WC   feeding supplement (ENSURE ENLIVE)  237 mL Oral BID BM   furosemide  40 mg Oral Daily   insulin aspart  0-9 Units Subcutaneous TID WC   insulin aspart protamine- aspart  5 Units Subcutaneous BID WC   isosorbide mononitrate  30 mg Oral Daily   mouth rinse  15 mL Mouth Rinse BID   polyethylene glycol  17 g Oral BID   Continuous Infusions:   LOS: 4 days     Enzo Bi, MD [facility] If 7PM-7AM, please contact night-coverage 02/12/2020, 1:44 PM

## 2020-02-12 NOTE — Progress Notes (Addendum)
Rick Zuniga  MRN: [id number]  DOB/AGE: [birth year] 74 y.o.  Primary Care Physician:Johnston, Chrystie Nose, MD  Admit date: 02/08/2020  Chief Complaint:  Chief Complaint  Patient presents with  . Leg Swelling  . Leg Pain    S-Pt presented on  02/08/2020 with  Chief Complaint  Patient presents with  . Leg Swelling  . Leg Pain  . Patient main concern on today's visit was feeling better than before. Patient spouse was present in the room as yesterday.  Patient and his spouse mentioned that his swelling is now much better  Medications . amLODipine  10 mg Oral Daily  . apixaban  10 mg Oral BID   Followed by  . [START ON 02/16/2020] apixaban  5 mg Oral BID  . aspirin EC  81 mg Oral Daily  . carvedilol  6.25 mg Oral BID WC  . feeding supplement (ENSURE ENLIVE)  237 mL Oral BID BM  . furosemide  40 mg Oral Daily  . insulin aspart  0-9 Units Subcutaneous TID WC  . insulin aspart protamine- aspart  5 Units Subcutaneous BID WC  . isosorbide mononitrate  30 mg Oral Daily  . mouth rinse  15 mL Mouth Rinse BID  . polyethylene glycol  17 g Oral BID         WGN:FAOZH from the symptoms mentioned above,there are no other symptoms referable to all systems reviewed.  Physical Exam: Vital signs in last 24 hours: Temp:  [98.1 F (36.7 C)-99.3 F (37.4 C)] 98.2 F (36.8 C) (06/27 1548) Pulse Rate:  [77-91] 79 (06/27 1636) Resp:  [16-22] 22 (06/27 1548) BP: (110-147)/(62-67) 110/64 (06/27 1548) SpO2:  [82 %-96 %] 96 % (06/27 1636) Weight change:  Last BM Date: 02/11/20 (per pt)  Intake/Output from previous day: 06/26 0701 - 06/27 0700 In: -  Out: 775 [Urine:775] Total I/O In: -  Out: 550 [Urine:550]   Physical Exam: General- pt is awake,alert, oriented to time place and person Resp- No acute REsp distress, BS decreased at bases CVS- S1S2 regular in rate and rhythm GIT- BS+, soft, NT, ND EXT- 1+  LE Edema, no Cyanosis   Lab Results: CBC Recent Labs    02/11/20 0945  02/12/20 0445  WBC 7.8 6.6  HGB 9.0* 8.6*  HCT 24.9* 25.0*  PLT 97* 102*    BMET Recent Labs    02/11/20 0945 02/12/20 0445  NA 135 137  K 3.6 3.7  CL 98 99  CO2 28 29  GLUCOSE 298* 147*  BUN 48* 51*  CREATININE 2.37* 2.56*  CALCIUM 8.3* 8.3*   Creatinine trend 2021  2.23==> 1.94==>2.4=>2.6 2020  1.3 2019  1.3  Results for SEVERIANO, Zuniga (MRN [id number]) as of 02/12/2020 06:23  Ref. Range 02/11/2020 15:59  Iron Latest Ref Range: 45 - 182 ug/dL 15 (L)  UIBC Latest Units: ug/dL 152  TIBC Latest Ref Range: 250 - 450 ug/dL 167 (L)  Saturation Ratios Latest Ref Range: 17.9 - 39.5 % 9 (L)  Ferritin Latest Ref Range: 24 - 336 ng/mL 240  Folate Latest Ref Range: >5.9 ng/mL 13.2    MICRO Recent Results (from the past 240 hour(s))  SARS Coronavirus 2 by RT PCR (hospital order, performed in [facility] hospital lab) Nasopharyngeal Nasopharyngeal Swab     Status: None   Collection Time: 02/08/20  5:26 AM   Specimen: Nasopharyngeal Swab  Result Value Ref Range Status   SARS Coronavirus 2 NEGATIVE NEGATIVE Final    Comment: (NOTE)  SARS-CoV-2 target nucleic acids are NOT DETECTED.  The SARS-CoV-2 RNA is generally detectable in upper and lower respiratory specimens during the acute phase of infection. The lowest concentration of SARS-CoV-2 viral copies this assay can detect is 250 copies / mL. A negative result does not preclude SARS-CoV-2 infection and should not be used as the sole basis for treatment or other patient management decisions.  A negative result may occur with improper specimen collection / handling, submission of specimen other than nasopharyngeal swab, presence of viral mutation(s) within the areas targeted by this assay, and inadequate number of viral copies (<250 copies / mL). A negative result must be combined with clinical observations, patient history, and epidemiological information.  Fact Sheet for Patients:    StrictlyIdeas.no  Fact Sheet for Healthcare Providers: BankingDealers.co.za  This test is not yet approved or  cleared by the [place] FDA and has been authorized for detection and/or diagnosis of SARS-CoV-2 by FDA under an Emergency Use Authorization (EUA).  This EUA will remain in effect (meaning this test can be used) for the duration of the COVID-19 declaration under Section 564(b)(1) of the Act, 21 U.S.C. section 360bbb-3(b)(1), unless the authorization is terminated or revoked sooner.  Performed at [facility], [address] [place], [place] [address]   Culture, blood (Routine X 2) w Reflex to ID Panel     Status: None (Preliminary result)   Collection Time: 02/10/20 10:24 AM   Specimen: BLOOD  Result Value Ref Range Status   Specimen Description BLOOD LEFT ANTECUBITAL  Final   Special Requests   Final    BOTTLES DRAWN AEROBIC AND ANAEROBIC Blood Culture adequate volume   Culture   Final    NO GROWTH 2 DAYS Performed at [facility], [address], [place], [place] [address]    Report Status PENDING  Incomplete  Culture, blood (Routine X 2) w Reflex to ID Panel     Status: None (Preliminary result)   Collection Time: 02/10/20 10:30 AM   Specimen: BLOOD  Result Value Ref Range Status   Specimen Description BLOOD BLOOD LEFT HAND  Final   Special Requests   Final    BOTTLES DRAWN AEROBIC AND ANAEROBIC Blood Culture results may not be optimal due to an excessive volume of blood received in culture bottles   Culture   Final    NO GROWTH 2 DAYS Performed at [facility], [address], [place], [place] [address]    Report Status PENDING  Incomplete  Urine Culture     Status: Abnormal (Preliminary result)   Collection Time: 02/11/20  4:08 PM   Specimen: Urine, Clean Catch  Result Value Ref Range Status   Specimen Description   Final    URINE, CLEAN CATCH Performed at [facility], [address] [place], [place] [address]    Special Requests   Final    NONE Performed at [facility], [address] [place], [place] [address]    Culture (A)  Final    >=100,000 COLONIES/mL KLEBSIELLA PNEUMONIAE >=100,000 COLONIES/mL ENTEROCOCCUS FAECALIS CULTURE REINCUBATED FOR BETTER GROWTH Performed at [facility], [place] [address], [place], [place] [address]    Report Status PENDING  Incomplete      Lab Results  Component Value Date   CALCIUM 8.3 (L) 02/12/2020   PHOS 4.0 02/12/2020     CT abdomen done in September 2020 Adrenals/Urinary Tract: Normal adrenal glands. No hydronephrosis or perinephric edema. Homogeneous renal enhancement  with symmetric excretion on delayed phase imaging. Urinary bladder partially distended but diffusely thick walled. Questionable small bladder diverticulum at the dome.   Reproductive: Enlarged prostate gland spanning 5.7 cm transverse.   IMPRESSION: 1. No acute abnormality or explanation for right lower quadrant pain. 2. Moderate colonic stool burden with fecalization of distal small bowel contents, can be seen with constipation. 3. Incidental duodenal diverticulum. 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 4. Enlarged prostate gland. 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 No perivesicular edema.   2D echo done on February 08, 2020 showed 1. Left ventricular ejection fraction, by estimation, is 40 to 45%. The  left ventricle has mildly decreased function. The left ventricle  demonstrates global hypokinesis. The left ventricular internal cavity size  was mildly to moderately dilated. Left  ventricular diastolic parameters are consistent with Grade II diastolic  dysfunction (pseudonormalization).   Renal ultrasound  done 06.26.21 Right Kidney:  Renal measurements: 11.0 x 4.9 x 5.6 cm = volume: 157.8 mL. Mild increased parenchymal echogenicity. No mass, stone or hydronephrosis.  Left Kidney:  Renal measurements: 11.2 x 5.2 x 5.1 cm = volume: 156.3 mL. Mild increased parenchymal echogenicity. No mass, stone or hydronephrosis.  Bladder:  Mild bladder wall thickening and irregularity. No bladder mass or stone. Pre void volume 334.2 mL. Patient was unable to void.  Other:  None.  IMPRESSION: 1. Mild bladder wall thickening. Consider cystitis in the proper clinical setting. This could be due to chronic bladder outlet obstruction. 2. Mild increased bilateral renal parenchymal echogenicity consistent with medical renal disease. No masses, stones or hydronephrosis.   Impression:  Patient is a 74 year old Caucasian male with a past medical history of COPD, diabetes mellitus type 2, coronary artery disease, hypertension, thrombocytopenia, CKD stage IIIb who was admitted to the hospital on June 23 with chief complaint of lower extremity edema  1)Renal  AKI secondary to ATN               Possible obstructive component as well bladder scan showed 600 ml in bladder                Postvoid residual.PVR was 345 ml.                 AKI on CKD               CKD stage 3.               CKD since 2019               CKD secondary to multiple etiologies                 Diabetes mellitus/hypertension /obstructive uropathy/possible contribution from cardiorenal syndrome                Progression of CKD marked with AKI                 Patient AKI is worsening, patient creatinine is gone up from 1.9  To 2.6                   2)HTN blood pressure is at goal  Medication- On Diuretics  on Calcium Channel Blockers On Alpha and beta Blockers  On Vasodilators-    3)Anemia of chronic disease Patient also has a component of iron deficiency anemia  HGb  is not  at goal (9--11)  We will start  p.o. iron   patient may need GI help  4) secondary hyperparathyroidism -CKD Mineral-Bone Disorder   Secondary Hyperparathyroidism absent Phosphorus is at goal    5) combined systolic and diastolic CHF. Patient is now 3.4 L negative Patient is now better We will continue Lasix 40 mg p.o. daily for now  6) electrolytes   sodium Normonatremic   potassium Normokalemic    7)Acid base Co2 at goal     Plan:  We will continue the current treatment. We will add autoimmune work-up I discussed worsening of kidney function with the patient and his spouse.  I also discussed with the patient about the reason I asked for immune work-up Patient is a high risk candidate for biopsy as patient is on Eliquis but autoimmune work-up will help [place] with etiology/differential.  Luisfelipe Engelstad s Jonea Bukowski 02/12/2020, 5:52 PM

## 2020-02-12 NOTE — Progress Notes (Addendum)
Ace wrap was not applied to bilateral LE.  Pt reported that he is allergic to Ace wrap.  Reaction include severe skin irritation.  The Ace list was added to pt's allergies list.  Dr Billie Ruddy made aware.  Ace wrap order d/c per MD.

## 2020-02-13 ENCOUNTER — Inpatient Hospital Stay: Payer: Medicare Other

## 2020-02-13 ENCOUNTER — Encounter: Payer: Self-pay | Admitting: Family Medicine

## 2020-02-13 LAB — CBC
HCT: 24.7 % — ABNORMAL LOW (ref 39.0–52.0)
Hemoglobin: 8.6 g/dL — ABNORMAL LOW (ref 13.0–17.0)
MCH: 32 pg (ref 26.0–34.0)
MCHC: 34.8 g/dL (ref 30.0–36.0)
MCV: 91.8 fL (ref 80.0–100.0)
Platelets: 111 10*3/uL — ABNORMAL LOW (ref 150–400)
RBC: 2.69 MIL/uL — ABNORMAL LOW (ref 4.22–5.81)
RDW: 12.1 % (ref 11.5–15.5)
WBC: 6.5 10*3/uL (ref 4.0–10.5)
nRBC: 0 % (ref 0.0–0.2)

## 2020-02-13 LAB — BASIC METABOLIC PANEL
Anion gap: 15 (ref 5–15)
BUN: 64 mg/dL — ABNORMAL HIGH (ref 8–23)
CO2: 24 mmol/L (ref 22–32)
Calcium: 8.2 mg/dL — ABNORMAL LOW (ref 8.9–10.3)
Chloride: 95 mmol/L — ABNORMAL LOW (ref 98–111)
Creatinine, Ser: 2.87 mg/dL — ABNORMAL HIGH (ref 0.61–1.24)
GFR calc Af Amer: 24 mL/min — ABNORMAL LOW (ref >60)
GFR calc non Af Amer: 20 mL/min — ABNORMAL LOW (ref >60)
Glucose, Bld: 196 mg/dL — ABNORMAL HIGH (ref 70–99)
Potassium: 4 mmol/L (ref 3.5–5.1)
Sodium: 134 mmol/L — ABNORMAL LOW (ref 135–145)

## 2020-02-13 LAB — C3 COMPLEMENT: C3 Complement: 129 mg/dL (ref 82–167)

## 2020-02-13 LAB — PROTEIN ELECTROPHORESIS, SERUM
A/G Ratio: 0.9 (ref 0.7–1.7)
Albumin ELP: 2.6 g/dL — ABNORMAL LOW (ref 2.9–4.4)
Alpha-1-Globulin: 0.4 g/dL (ref 0.0–0.4)
Alpha-2-Globulin: 0.9 g/dL (ref 0.4–1.0)
Beta Globulin: 0.8 g/dL (ref 0.7–1.3)
Gamma Globulin: 0.7 g/dL (ref 0.4–1.8)
Globulin, Total: 3 g/dL (ref 2.2–3.9)
Total Protein ELP: 5.6 g/dL — ABNORMAL LOW (ref 6.0–8.5)

## 2020-02-13 LAB — C4 COMPLEMENT: Complement C4, Body Fluid: 37 mg/dL (ref 12–38)

## 2020-02-13 LAB — GLUCOSE, CAPILLARY
Glucose-Capillary: 200 mg/dL — ABNORMAL HIGH (ref 70–99)
Glucose-Capillary: 187 mg/dL — ABNORMAL HIGH (ref 70–99)
Glucose-Capillary: 214 mg/dL — ABNORMAL HIGH (ref 70–99)

## 2020-02-13 LAB — MAGNESIUM: Magnesium: 2.2 mg/dL (ref 1.7–2.4)

## 2020-02-13 LAB — GLOMERULAR BASEMENT MEMBRANE ANTIBODIES: GBM Ab: 3 units (ref 0–20)

## 2020-02-13 LAB — MPO/PR-3 (ANCA) ANTIBODIES
ANCA Proteinase 3: <3.5 U/mL (ref 0.0–3.5)
Myeloperoxidase Abs: <9 U/mL (ref 0.0–9.0)

## 2020-02-13 LAB — BRAIN NATRIURETIC PEPTIDE: B Natriuretic Peptide: 1636.5 pg/mL — ABNORMAL HIGH (ref 0.0–100.0)

## 2020-02-13 LAB — ANTINUCLEAR ANTIBODIES, IFA: ANA Ab, IFA: NEGATIVE

## 2020-02-13 LAB — ALBUMIN: Albumin: 2.6 g/dL — ABNORMAL LOW (ref 3.5–5.0)

## 2020-02-13 LAB — ANTI-DNA ANTIBODY, DOUBLE-STRANDED: ds DNA Ab: <1 IU/mL (ref 0–9)

## 2020-02-13 MED ORDER — SODIUM CHLORIDE 0.9 % IV SOLN
1.5000 g | Freq: Two times a day (BID) | INTRAVENOUS | Status: DC
  Administered 2020-02-13 – 2020-02-15 (×4): 1.5 g via INTRAVENOUS
  Filled 2020-02-13 (×4): qty 1.5
  Filled 2020-02-13: qty 4

## 2020-02-13 MED ORDER — METOLAZONE 5 MG PO TABS
5.0000 mg | ORAL_TABLET | Freq: Once | ORAL | Status: AC
  Administered 2020-02-13: 5 mg via ORAL
  Filled 2020-02-13: qty 1

## 2020-02-13 MED ORDER — TECHNETIUM TO 99M ALBUMIN AGGREGATED
4.0000 | Freq: Once | INTRAVENOUS | Status: AC | PRN
  Administered 2020-02-13: 11:00:00 4.35 via INTRAVENOUS

## 2020-02-13 MED ORDER — FUROSEMIDE 10 MG/ML IJ SOLN
60.0000 mg | Freq: Every day | INTRAMUSCULAR | Status: DC
  Administered 2020-02-14: 09:00:00 60 mg via INTRAVENOUS
  Filled 2020-02-13: qty 6

## 2020-02-13 MED ORDER — FUROSEMIDE 10 MG/ML IJ SOLN
60.0000 mg | Freq: Once | INTRAMUSCULAR | Status: AC
  Administered 2020-02-13: 60 mg via INTRAVENOUS
  Filled 2020-02-13: qty 6

## 2020-02-13 NOTE — Progress Notes (Signed)
PROGRESS NOTE    HALEY ROZA  ZTI:[id number] DOB: [birth year] DOA: 02/08/2020 PCP: [name], MD    Assessment & Plan:   Active Problems:   AKI (acute kidney injury) ([place])    Tamarion Haymond  is a 74 y.o. Caucasian male with a known history of type 2 diabetes mellitus, hypertension, coronary artery disease, COPD and osteoarthritis, presented to the emergency room with acute onset of bilateral lower extremity pain and swelling which have been worsening over the last 5 days.  Prior to that the patient had an eye procedure that required IV contrast.  He had no recent long travels or other surgeries.  He stated that he stayed in a recliner all day yesterday.  He usually has good pain intolerance and was shouting in pain per his wife.  He has chronic diarrhea but denied any nausea or vomiting or abdominal pain.  No dysuria, oliguria or hematuria or flank pain.  He has been having chills and recent cold intolerance.  He admitted to recent dyspnea on exertion with orthopnea and paroxysmal nocturnal dyspnea with no chest pain or palpitations.   # Dyspnea on exertion and orthopnea with bilateral lower extremity edema. 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  BNP 1636. PLAN: --Trial IV lasix 60 mg x1 today, with metolazone. --Strict I/O  # Acute kidney injury likely due to cardio-renal syndrome # On CKD3b  --Cr didn't improve much after MIVF.  Since pt has BLE swelling and DOE, pt may have congestion instead (cardio-renal syndrome).  Cr improved with IV Lasix.   --Nephrology consulted --Cr continues to worsen, up to 2.87 this morning.   PLAN: --Trial IV lasix 60 mg x1 today, with metolazone. --Strict I/O  # Left lower extremity DVT. -Uncertain chronicity.  Started on  IV heparin on admission, transitioned to Eliquis PLAN: -continue Eliquis   # LLE pain --spastic, and crampy in nature.  Cause unclear.   --continue oxycodone PRN  4.  Hypertension. -Continue amlodipine, Coreg, Imdur and hold off Cozaar given acute kidney injury.  5.  Type 2 diabetes mellitus. Hypoglycemic episode -Had BG 27.  Pt's home med was listed as Novolin 70/30 10u BID, however, pt reported taking 5u BID. PLAN: --continue 70/30 as 5u BID --ACHS and SSI  6.  Coronary artery disease. -continue home Imdur and Coreg.  Fever  Presumed due to UTI --up to 102.8.  2 nights in a row.   --Procal 0.2 --blood cx x2 neg so far --UA neg, but urine cx grew Klebsiella and Enterococcus, sensitivities pending, started on Unasyn PLAN: --continue Unasyn  --V/Q scan today (avoid contrast 2/2 AKI), neg for PE  # N/V and abdominal pain, resolved --symptoms started night of 6/24 and lasted about 2 days.  No diarrhea.  No BM for at least 4 days. --KUB 6/26, no acute finding --Miralax BID   DVT prophylaxis: AS:[id number] Code Status: Full code  Family Communication: fiance and daughter and son updated at the bedside Status is: inpatient Dispo:   The patient is from: home Anticipated d/c is to: home Anticipated d/c date is: unclear Patient currently is not medically stable to d/c due to: worsening AKI managed by nephrology.  Likely has CHF exacerbation.  Treating fever, presumed to be of urinary origin.   Subjective and Interval History:  Pt continued to  have left leg spastic pain.  No fever overnight.  No chest pain, abdominal pain, N/V/D, dysuria.  Still not eating well.   Objective: Vitals:   02/13/20 0842 02/13/20 1158 02/13/20 1200 02/13/20 1600  BP: 132/64 116/68  120/67  Pulse: 84 76  73  Resp:      Temp: 98.6 F (37 C) 98.1 F (36.7 C)  97.8 F (36.6 C)  TempSrc: Oral Oral    SpO2: 92% 91% 98% 93%  Weight:      Height:        Intake/Output Summary (Last 24  hours) at 02/13/2020 1702 Last data filed at 02/13/2020 0818 Gross per 24 hour  Intake 450.14 ml  Output 400 ml  Net 50.14 ml   Filed Weights   02/08/20 0212  Weight: 81.6 kg    Examination:   Constitutional: NAD, AAOx3, standing in front of sink HEENT: conjunctivae and lids normal, EOMI CV: RRR no M,R,G. Distal pulses +2.  No cyanosis.   RESP: decreased lung sounds over posterior LLL, normal respiratory effort  GI: +BS, NTND, soft Extremities: 2+ pitting edema in BLE SKIN: warm, dry and intact Neuro: II - XII grossly intact.  Sensation intact Psych: better mood and affect.     Data Reviewed: I have personally reviewed following labs and imaging studies  CBC: Recent Labs  Lab 02/08/20 0209 02/08/20 0209 02/09/20 0012 02/10/20 0442 02/11/20 0945 02/12/20 0445 02/13/20 0514  WBC 5.2   < > 3.7* 7.7 7.8 6.6 6.5  NEUTROABS 3.7  --   --   --   --   --   --   HGB 9.9*   < > 8.2* 8.6* 9.0* 8.6* 8.6*  HCT 28.4*   < > 23.5* 24.8* 24.9* 25.0* 24.7*  MCV 92.5   < > 92.9 92.5 89.6 92.6 91.8  PLT 121*   < > 92* 97* 97* 102* 111*   < > = values in this interval not displayed.   Basic Metabolic Panel: Recent Labs  Lab 02/09/20 0012 02/10/20 0442 02/11/20 0945 02/12/20 0445 02/12/20 0752 02/13/20 0514  NA 142 139 135 137  --  134*  K 3.7 3.7 3.6 3.7  --  4.0  CL 106 101 98 99  --  95*  CO2 28 26 28 29   --  24  GLUCOSE 117* 222* 298* 147*  --  196*  BUN 41* 37* 48* 51*  --  64*  CREATININE 2.14* 1.94* 2.37* 2.56*  --  2.87*  CALCIUM 8.2* 8.3* 8.3* 8.3*  --  8.2*  MG 1.7 1.6* 2.1 2.2  --  2.2  PHOS  --   --   --   --  4.0  --    GFR: Estimated Creatinine Clearance: 25.1 mL/min (A) (by C-G formula based on SCr of 2.87 mg/dL (H)). Liver Function Tests: Recent Labs  Lab 02/13/20 0514  ALBUMIN 2.6*   No results for input(s): LIPASE, AMYLASE in the last 168 hours. No results for input(s): AMMONIA in the last 168 hours. Coagulation Profile: Recent Labs  Lab  02/08/20 0209  INR 1.1   Cardiac Enzymes: No results for input(s): CKTOTAL, CKMB, CKMBINDEX, TROPONINI in the last 168 hours. BNP (last 3 results) No results for input(s): PROBNP in the last 8760 hours. HbA1C: No results for input(s): HGBA1C in the last 72 hours. CBG: Recent Labs  Lab 02/12/20 1159 02/12/20 1547 02/12/20 2042 02/13/20 0820 02/13/20 1157  GLUCAP 161* 210* 140* 200* 187*   Lipid  Profile: No results for input(s): CHOL, HDL, LDLCALC, TRIG, CHOLHDL, LDLDIRECT in the last 72 hours. Thyroid Function Tests: No results for input(s): TSH, T4TOTAL, FREET4, T3FREE, THYROIDAB in the last 72 hours. Anemia Panel: Recent Labs    02/11/20 1559  VITAMINB12 309  FOLATE 13.2  FERRITIN 240  TIBC 167*  IRON 15*  RETICCTPCT 1.1   Sepsis Labs: Recent Labs  Lab 02/10/20 1024 02/11/20 0945 02/12/20 0445  PROCALCITON 0.20 0.30 0.32    Recent Results (from the past 240 hour(s))  SARS Coronavirus 2 by RT PCR (hospital order, performed in [facility] hospital lab) Nasopharyngeal Nasopharyngeal Swab     Status: None   Collection Time: 02/08/20  5:26 AM   Specimen: Nasopharyngeal Swab  Result Value Ref Range Status   SARS Coronavirus 2 NEGATIVE NEGATIVE Final    Comment: (NOTE) SARS-CoV-2 target nucleic acids are NOT DETECTED.  The SARS-CoV-2 RNA is generally detectable in upper and lower respiratory specimens during the acute phase of infection. The lowest concentration of SARS-CoV-2 viral copies this assay can detect is 250 copies / mL. A negative result does not preclude SARS-CoV-2 infection and should not be used as the sole basis for treatment or other patient management decisions.  A negative result may occur with improper specimen collection / handling, submission of specimen other than nasopharyngeal swab, presence of viral mutation(s) within the areas targeted by this assay, and inadequate number of viral copies (<250 copies / mL). A negative result must be  combined with clinical observations, patient history, and epidemiological information.  Fact Sheet for Patients:   StrictlyIdeas.no  Fact Sheet for Healthcare Providers: BankingDealers.co.za  This test is not yet approved or  cleared by the [place] FDA and has been authorized for detection and/or diagnosis of SARS-CoV-2 by FDA under an Emergency Use Authorization (EUA).  This EUA will remain in effect (meaning this test can be used) for the duration of the COVID-19 declaration under Section 564(b)(1) of the Act, 21 U.S.C. section 360bbb-3(b)(1), unless the authorization is terminated or revoked sooner.  Performed at [facility], [address] [place], [place] [address]   Culture, blood (Routine X 2) w Reflex to ID Panel     Status: None (Preliminary result)   Collection Time: 02/10/20 10:24 AM   Specimen: BLOOD  Result Value Ref Range Status   Specimen Description BLOOD LEFT ANTECUBITAL  Final   Special Requests   Final    BOTTLES DRAWN AEROBIC AND ANAEROBIC Blood Culture adequate volume   Culture   Final    NO GROWTH 3 DAYS Performed at [facility], [address] [place], [place] [address]    Report Status PENDING  Incomplete  Culture, blood (Routine X 2) w Reflex to ID Panel     Status: None (Preliminary result)   Collection Time: 02/10/20 10:30 AM   Specimen: BLOOD  Result Value Ref Range Status   Specimen Description BLOOD BLOOD LEFT HAND  Final   Special Requests   Final    BOTTLES DRAWN AEROBIC AND ANAEROBIC Blood Culture results may not be optimal due to an excessive volume of blood received in culture bottles   Culture   Final    NO GROWTH 3 DAYS Performed at [facility], [address], [place], [place] [address]    Report Status PENDING  Incomplete  Urine Culture     Status: Abnormal (Preliminary result)   Collection Time: 02/11/20  4:08 PM   Specimen: Urine, Clean Catch  Result Value Ref Range Status   Specimen Description   Final    URINE, CLEAN CATCH Performed at [facility], [address] [place] [address], [place], [place] [address]    Special Requests   Final    NONE Performed at [facility], [address] [place], [place] [address]    Culture (A)  Final    >=100,000 COLONIES/mL KLEBSIELLA PNEUMONIAE >=100,000 COLONIES/mL ENTEROCOCCUS FAECALIS SUSCEPTIBILITIES TO FOLLOW Performed at [facility], [place] [address], [place], [place] [address]    Report Status PENDING  Incomplete   Organism ID, Bacteria KLEBSIELLA PNEUMONIAE (A)  Final      Susceptibility   Klebsiella pneumoniae - MIC*    AMPICILLIN >=32 RESISTANT Resistant     CEFAZOLIN <=4 SENSITIVE Sensitive     CEFTRIAXONE <=0.25 SENSITIVE Sensitive     CIPROFLOXACIN <=0.25 SENSITIVE Sensitive     GENTAMICIN <=1 SENSITIVE Sensitive     IMIPENEM <=0.25 SENSITIVE Sensitive     NITROFURANTOIN 64 INTERMEDIATE Intermediate     TRIMETH/SULFA <=20 SENSITIVE Sensitive     AMPICILLIN/SULBACTAM 4 SENSITIVE Sensitive     PIP/TAZO <=4 SENSITIVE Sensitive     * >=100,000 COLONIES/mL KLEBSIELLA PNEUMONIAE      Radiology Studies: DG Chest 2 View  Result Date: 02/13/2020 CLINICAL DATA:  Congestive heart failure. EXAM: CHEST - 2 VIEW COMPARISON:  February 13, 2020 FINDINGS: The heart size remains enlarged. The patient is status post prior median sternotomy. There is a moderate-sized left-sided pleural effusion with adjacent atelectasis. There is no pneumothorax. There may be a small right-sided pleural effusion. There is vascular congestion. IMPRESSION: No significant interval change. Persistent cardiomegaly with small to moderate-sized bilateral pleural effusions, left greater than right. Electronically Signed   By: Constance Holster M.D.   On: 02/13/2020 15:33   NM Pulmonary Perfusion  Result Date: 02/13/2020 CLINICAL DATA:  Hypoxemia. Pain and swelling in the left leg with left lower  extremity DVT. Is going on here EXAM: NUCLEAR MEDICINE PERFUSION LUNG SCAN TECHNIQUE: Perfusion images were obtained in multiple projections after intravenous injection of radiopharmaceutical. Ventilation scans intentionally deferred if perfusion scan and chest x-ray adequate for interpretation during COVID 19 epidemic. RADIOPHARMACEUTICALS:  4.4 mCi Tc-30m MAA IV COMPARISON:  Chest x-ray earlier same day. FINDINGS: Multiplanar perfusion imaging shows no peripheral wedge-shaped perfusion defect in either lung. Decreased perfusion at the left base is compatible with the collapse/consolidation and small effusion seen on chest x-ray earlier today. IMPRESSION: Perfusion imaging is negative for pulmonary embolus. Electronically Signed   By: Misty Stanley M.D.   On: 02/13/2020 11:55   DG Chest Port 1 View  Result Date: 02/13/2020 CLINICAL DATA:  Blood clot in left leg.  History of COPD. EXAM: PORTABLE CHEST 1 VIEW COMPARISON:  02/12/2020 FINDINGS: There is bilateral diffuse mild interstitial thickening. There is a trace right pleural effusion. There is a small left pleural effusion. There is no pneumothorax. There is stable cardiomegaly. There is evidence of prior CABG. There is no acute osseous abnormality. IMPRESSION: Cardiomegaly with pulmonary vascular congestion. Trace right and small left pleural effusions. Electronically Signed   By: Kathreen Devoid   On: 02/13/2020 10:03   DG Chest Port 1 View  Result Date: 02/12/2020 CLINICAL DATA:  Hypoxia, acute renal failure, COPD, diabetes mellitus, hypertension, DVT, coronary artery disease post CABG EXAM: PORTABLE CHEST 1 VIEW COMPARISON:  Portable exam 1003 hours compared to 02/08/2020 FINDINGS: Minimal enlargement of cardiac silhouette post CABG. Mediastinal contours and pulmonary vascularity normal. LEFT  pleural effusion and basilar atelectasis. Questionable tiny RIGHT pleural effusion. No pneumothorax or segmental consolidation. Osseous structures demineralized.  IMPRESSION: Minimal enlargement of cardiac silhouette post CABG. LEFT pleural effusion and basilar atelectasis. Electronically Signed   By: Lavonia Dana M.D.   On: 02/12/2020 10:28     Scheduled Meds: . amLODipine  10 mg Oral Daily  . apixaban  10 mg Oral BID   Followed by  . [START ON 02/16/2020] apixaban  5 mg Oral BID  . aspirin EC  81 mg Oral Daily  . carvedilol  6.25 mg Oral BID WC  . feeding supplement (ENSURE ENLIVE)  237 mL Oral BID BM  . ferrous sulfate  325 mg Oral BID WC  . insulin aspart  0-9 Units Subcutaneous TID WC  . insulin aspart protamine- aspart  5 Units Subcutaneous BID WC  . isosorbide mononitrate  30 mg Oral Daily  . mouth rinse  15 mL Mouth Rinse BID  . polyethylene glycol  17 g Oral BID   Continuous Infusions: . sodium chloride Stopped (02/12/20 1802)  . ampicillin-sulbactam (UNASYN) IV       LOS: 5 days     Enzo Bi, MD [facility] If 7PM-7AM, please contact night-coverage 02/13/2020, 5:02 PM

## 2020-02-13 NOTE — Progress Notes (Signed)
Pt returned from [place]. O2 sats 85% RA. O2 sats increased to 91% with deep breathing encouragement; however, unable to maintain. 2L [place] applied. Will continue to monitor.

## 2020-02-13 NOTE — Care Management Important Message (Signed)
Important Message  Patient Details  Name: PAXTYN BOYAR MRN: [id number] Date of Birth: [birth year]   Medicare Important Message Given:  Yes     Juliann Pulse A Romuald Mccaslin 02/13/2020, 11:08 AM

## 2020-02-13 NOTE — Progress Notes (Signed)
PHARMACY NOTE:  ANTIMICROBIAL RENAL DOSAGE ADJUSTMENT  Current antimicrobial regimen includes a mismatch between antimicrobial dosage and estimated renal function.  As per policy approved by the Pharmacy & Therapeutics and Medical Executive Committees, the antimicrobial dosage will be adjusted accordingly.  Current antimicrobial dosage:  Unasyn 1.5 g q6 hours  Indication: UTI  Renal Function:  Estimated Creatinine Clearance: 25.1 mL/min (A) (by C-G formula based on SCr of 2.87 mg/dL (H)). []      On intermittent HD, scheduled: []      On CRRT    Antimicrobial dosage has been changed to:  Unasyn 1.5 g q12 hours  Additional comments:  N/A   Thank you for allowing pharmacy to be a part of this patient's care.  Gerald Dexter, [facility] 02/13/2020 8:55 AM

## 2020-02-13 NOTE — Progress Notes (Signed)
Pt being transported to [facility] for VQ scan in stable condition.

## 2020-02-13 NOTE — Progress Notes (Signed)
[facility]  ROUNDING NOTE   Subjective:   Family at bedside. Patient complains of shortness of breath. V/Q study negative.  Given furosemide 60mg  IV x 1 and 40mg  PO x 1  Objective:  Vital signs in last 24 hours:  Temp:  [98.1 F (36.7 C)-98.6 F (37 C)] 98.1 F (36.7 C) (06/28 1158) Pulse Rate:  [76-84] 76 (06/28 1158) Resp:  [16-22] 16 (06/28 0436) BP: (110-142)/(64-78) 116/68 (06/28 1158) SpO2:  [83 %-98 %] 98 % (06/28 1200)  Weight change:  Filed Weights   02/08/20 0212  Weight: 81.6 kg    Intake/Output: I/O last 3 completed shifts: In: 210.1 [I.V.:10.1; IV Piggyback:200] Out: 550 [Urine:550]   Intake/Output this shift:  Total I/O In: 240 [P.O.:240] Out: 400 [Urine:400]  Physical Exam: General: NAD,   Head: Normocephalic, atraumatic. Moist oral mucosal membranes  Eyes: Anicteric, PERRL  Neck: Supple, trachea midline  Lungs:  Left base diminished  Heart: Regular rate and rhythm  Abdomen:  Soft, nontender,   Extremities:  + peripheral edema.  Neurologic: Nonfocal, moving all four extremities  Skin: No lesions        Basic Metabolic Panel: Recent Labs  Lab 02/09/20 0012 02/09/20 0012 02/10/20 0442 02/10/20 0442 02/11/20 0945 02/12/20 0445 02/12/20 0752 02/13/20 0514  NA 142  --  139  --  135 137  --  134*  K 3.7  --  3.7  --  3.6 3.7  --  4.0  CL 106  --  101  --  98 99  --  95*  CO2 28  --  26  --  28 29  --  24  GLUCOSE 117*  --  222*  --  298* 147*  --  196*  BUN 41*  --  37*  --  48* 51*  --  64*  CREATININE 2.14*  --  1.94*  --  2.37* 2.56*  --  2.87*  CALCIUM 8.2*   < > 8.3*   < > 8.3* 8.3*  --  8.2*  MG 1.7  --  1.6*  --  2.1 2.2  --  2.2  PHOS  --   --   --   --   --   --  4.0  --    < > = values in this interval not displayed.    Liver Function Tests: Recent Labs  Lab 02/13/20 0514  ALBUMIN 2.6*   No results for input(s): LIPASE, AMYLASE in the last 168 hours. No results for input(s): AMMONIA in the last 168  hours.  CBC: Recent Labs  Lab 02/08/20 0209 02/08/20 0209 02/09/20 0012 02/10/20 0442 02/11/20 0945 02/12/20 0445 02/13/20 0514  WBC 5.2   < > 3.7* 7.7 7.8 6.6 6.5  NEUTROABS 3.7  --   --   --   --   --   --   HGB 9.9*   < > 8.2* 8.6* 9.0* 8.6* 8.6*  HCT 28.4*   < > 23.5* 24.8* 24.9* 25.0* 24.7*  MCV 92.5   < > 92.9 92.5 89.6 92.6 91.8  PLT 121*   < > 92* 97* 97* 102* 111*   < > = values in this interval not displayed.    Cardiac Enzymes: No results for input(s): CKTOTAL, CKMB, CKMBINDEX, TROPONINI in the last 168 hours.  BNP: Invalid input(s): POCBNP  CBG: Recent Labs  Lab 02/12/20 1159 02/12/20 1547 02/12/20 2042 02/13/20 0820 02/13/20 1157  GLUCAP 161* 210* 140* 200* 187*  Microbiology: Results for orders placed or performed during the hospital encounter of 02/08/20  SARS Coronavirus 2 by RT PCR (hospital order, performed in [facility] hospital lab) Nasopharyngeal Nasopharyngeal Swab     Status: None   Collection Time: 02/08/20  5:26 AM   Specimen: Nasopharyngeal Swab  Result Value Ref Range Status   SARS Coronavirus 2 NEGATIVE NEGATIVE Final    Comment: (NOTE) SARS-CoV-2 target nucleic acids are NOT DETECTED.  The SARS-CoV-2 RNA is generally detectable in upper and lower respiratory specimens during the acute phase of infection. The lowest concentration of SARS-CoV-2 viral copies this assay can detect is 250 copies / mL. A negative result does not preclude SARS-CoV-2 infection and should not be used as the sole basis for treatment or other patient management decisions.  A negative result may occur with improper specimen collection / handling, submission of specimen other than nasopharyngeal swab, presence of viral mutation(s) within the areas targeted by this assay, and inadequate number of viral copies (<250 copies / mL). A negative result must be combined with clinical observations, patient history, and epidemiological information.  Fact Sheet for  Patients:   StrictlyIdeas.no  Fact Sheet for Healthcare Providers: BankingDealers.co.za  This test is not yet approved or  cleared by the [place] FDA and has been authorized for detection and/or diagnosis of SARS-CoV-2 by FDA under an Emergency Use Authorization (EUA).  This EUA will remain in effect (meaning this test can be used) for the duration of the COVID-19 declaration under Section 564(b)(1) of the Act, 21 U.S.C. section 360bbb-3(b)(1), unless the authorization is terminated or revoked sooner.  Performed at [facility], [address] [place], [place] [address]   Culture, blood (Routine X 2) w Reflex to ID Panel     Status: None (Preliminary result)   Collection Time: 02/10/20 10:24 AM   Specimen: BLOOD  Result Value Ref Range Status   Specimen Description BLOOD LEFT ANTECUBITAL  Final   Special Requests   Final    BOTTLES DRAWN AEROBIC AND ANAEROBIC Blood Culture adequate volume   Culture   Final    NO GROWTH 3 DAYS Performed at [facility], [address], [place], [place] [address]    Report Status PENDING  Incomplete  Culture, blood (Routine X 2) w Reflex to ID Panel     Status: None (Preliminary result)   Collection Time: 02/10/20 10:30 AM   Specimen: BLOOD  Result Value Ref Range Status   Specimen Description BLOOD BLOOD LEFT HAND  Final   Special Requests   Final    BOTTLES DRAWN AEROBIC AND ANAEROBIC Blood Culture results may not be optimal due to an excessive volume of blood received in culture bottles   Culture   Final    NO GROWTH 3 DAYS Performed at [facility], [address] [place], [place] [address]    Report Status PENDING  Incomplete  Urine Culture     Status: Abnormal (Preliminary result)   Collection Time: 02/11/20  4:08 PM   Specimen: Urine, Clean Catch  Result Value Ref Range Status   Specimen Description   Final    URINE, CLEAN CATCH Performed at  [facility], [address], [place], [place] [address]    Special Requests   Final    NONE Performed at [facility], [address] [place],  [address]    Culture (A)  Final    >=100,000 COLONIES/mL KLEBSIELLA PNEUMONIAE >=100,000 COLONIES/mL ENTEROCOCCUS FAECALIS SUSCEPTIBILITIES TO  FOLLOW Performed at [facility], [place] [address] [place], [place] [address]    Report Status PENDING  Incomplete   Organism ID, Bacteria KLEBSIELLA PNEUMONIAE (A)  Final      Susceptibility   Klebsiella pneumoniae - MIC*    AMPICILLIN >=32 RESISTANT Resistant     CEFAZOLIN <=4 SENSITIVE Sensitive     CEFTRIAXONE <=0.25 SENSITIVE Sensitive     CIPROFLOXACIN <=0.25 SENSITIVE Sensitive     GENTAMICIN <=1 SENSITIVE Sensitive     IMIPENEM <=0.25 SENSITIVE Sensitive     NITROFURANTOIN 64 INTERMEDIATE Intermediate     TRIMETH/SULFA <=20 SENSITIVE Sensitive     AMPICILLIN/SULBACTAM 4 SENSITIVE Sensitive     PIP/TAZO <=4 SENSITIVE Sensitive     * >=100,000 COLONIES/mL KLEBSIELLA PNEUMONIAE    Coagulation Studies: No results for input(s): LABPROT, INR in the last 72 hours.  Urinalysis: Recent Labs    02/11/20 1608  COLORURINE YELLOW*  LABSPEC 1.013  PHURINE 5.0  GLUCOSEU NEGATIVE  HGBUR MODERATE*  BILIRUBINUR NEGATIVE  KETONESUR NEGATIVE  PROTEINUR 30*  NITRITE NEGATIVE  LEUKOCYTESUR NEGATIVE      Imaging: NM Pulmonary Perfusion  Result Date: 02/13/2020 CLINICAL DATA:  Hypoxemia. Pain and swelling in the left leg with left lower extremity DVT. Is going on here EXAM: NUCLEAR MEDICINE PERFUSION LUNG SCAN TECHNIQUE: Perfusion images were obtained in multiple projections after intravenous injection of radiopharmaceutical. Ventilation scans intentionally deferred if perfusion scan and chest x-ray adequate for interpretation during COVID 19 epidemic. RADIOPHARMACEUTICALS:  4.4 mCi Tc-1m MAA IV COMPARISON:  Chest x-ray earlier same day. FINDINGS: Multiplanar  perfusion imaging shows no peripheral wedge-shaped perfusion defect in either lung. Decreased perfusion at the left base is compatible with the collapse/consolidation and small effusion seen on chest x-ray earlier today. IMPRESSION: Perfusion imaging is negative for pulmonary embolus. Electronically Signed   By: Misty Stanley M.D.   On: 02/13/2020 11:55   DG Chest Port 1 View  Result Date: 02/13/2020 CLINICAL DATA:  Blood clot in left leg.  History of COPD. EXAM: PORTABLE CHEST 1 VIEW COMPARISON:  02/12/2020 FINDINGS: There is bilateral diffuse mild interstitial thickening. There is a trace right pleural effusion. There is a small left pleural effusion. There is no pneumothorax. There is stable cardiomegaly. There is evidence of prior CABG. There is no acute osseous abnormality. IMPRESSION: Cardiomegaly with pulmonary vascular congestion. Trace right and small left pleural effusions. Electronically Signed   By: Kathreen Devoid   On: 02/13/2020 10:03   DG Chest Port 1 View  Result Date: 02/12/2020 CLINICAL DATA:  Hypoxia, acute renal failure, COPD, diabetes mellitus, hypertension, DVT, coronary artery disease post CABG EXAM: PORTABLE CHEST 1 VIEW COMPARISON:  Portable exam 1003 hours compared to 02/08/2020 FINDINGS: Minimal enlargement of cardiac silhouette post CABG. Mediastinal contours and pulmonary vascularity normal. LEFT pleural effusion and basilar atelectasis. Questionable tiny RIGHT pleural effusion. No pneumothorax or segmental consolidation. Osseous structures demineralized. IMPRESSION: Minimal enlargement of cardiac silhouette post CABG. LEFT pleural effusion and basilar atelectasis. Electronically Signed   By: Lavonia Dana M.D.   On: 02/12/2020 10:28     Medications:    sodium chloride Stopped (02/12/20 1802)   ampicillin-sulbactam (UNASYN) IV      amLODipine  10 mg Oral Daily   apixaban  10 mg Oral BID   Followed by   Derrill Memo ON 02/16/2020] apixaban  5 mg Oral BID   aspirin EC  81 mg  Oral Daily   carvedilol  6.25 mg Oral BID WC  feeding supplement (ENSURE ENLIVE)  237 mL Oral BID BM   ferrous sulfate  325 mg Oral BID WC   insulin aspart  0-9 Units Subcutaneous TID WC   insulin aspart protamine- aspart  5 Units Subcutaneous BID WC   isosorbide mononitrate  30 mg Oral Daily   mouth rinse  15 mL Mouth Rinse BID   polyethylene glycol  17 g Oral BID   sodium chloride, acetaminophen, magnesium hydroxide, nitroGLYCERIN, ondansetron **OR** ondansetron (ZOFRAN) IV, oxyCODONE, traZODone  Assessment/ Plan:  [name] is a 74 y.o. white male with COPD, insulin dependent diabetes mellitus type 2, coronary artery disease, hypertension, thrombocytopenia, who was admitted to [facility] on 02/08/2020 for AKI (acute kidney injury) ([place]) [N17.9] Acute deep vein thrombosis (DVT) of distal vein of left lower extremity ([place]) [I82.4Z2] Acute renal failure, unspecified acute renal failure type ([place]) [N17.9]  1. Acute kidney injury on chronic kidney disease stage IIIB with proteinuria: Baseline creatinine 1.8, GFR of 37.  Chronic kidney disease secondary to diabetic nephropathy.  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 No IV contrast exposure.  Creatinine continues to rise. Concern for overdiuresis.  Hold loop diuretics  No indication for renal biopsy.   2. 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 Echo EF 45-50% with grade II diastolic dysfunction on 4/15.  BNP elevated - Check 2 view CXR.   3. Diabetes mellitus type II with chronic kidney disease: insulin dependent. History of poor control. Hemoglobin A1c of 10.9 on 2/4.   4. Anemia of chronic kidney disease: hemoglobin 8.6, normocytic. Consistent with iron deficiency.   5. DVT: unknown chronicity.  - continue apixaban. Continue to monitor thrombocytopenia.     LOS: 5 Tamelia Michalowski 6/28/20213:27 PM

## 2020-02-13 NOTE — Progress Notes (Signed)
PT Cancellation Note  Patient Details Name: Rick Zuniga MRN: [id number] DOB: 21-May-1946   Cancelled Treatment:    Reason Eval/Treat Not Completed: Patient at procedure or test/unavailable (RN reports pt goign off floor for imaging studies.) Will attempt eval again at later date/time.   4:29 PM, 02/13/20 Etta Grandchild, PT, DPT Physical Therapist - [facility]  [phone] ([place])      [place] C 02/13/2020, 4:29 PM

## 2020-02-14 LAB — CBC
HCT: 22.4 % — ABNORMAL LOW (ref 39.0–52.0)
Hemoglobin: 8.3 g/dL — ABNORMAL LOW (ref 13.0–17.0)
MCH: 32.5 pg (ref 26.0–34.0)
MCHC: 37.1 g/dL — ABNORMAL HIGH (ref 30.0–36.0)
MCV: 87.8 fL (ref 80.0–100.0)
Platelets: 123 10*3/uL — ABNORMAL LOW (ref 150–400)
RBC: 2.55 MIL/uL — ABNORMAL LOW (ref 4.22–5.81)
RDW: 12 % (ref 11.5–15.5)
WBC: 5.5 10*3/uL (ref 4.0–10.5)
nRBC: 0 % (ref 0.0–0.2)

## 2020-02-14 LAB — BASIC METABOLIC PANEL
Anion gap: 12 (ref 5–15)
BUN: 72 mg/dL — ABNORMAL HIGH (ref 8–23)
CO2: 26 mmol/L (ref 22–32)
Calcium: 8.2 mg/dL — ABNORMAL LOW (ref 8.9–10.3)
Chloride: 96 mmol/L — ABNORMAL LOW (ref 98–111)
Creatinine, Ser: 2.92 mg/dL — ABNORMAL HIGH (ref 0.61–1.24)
GFR calc Af Amer: 23 mL/min — ABNORMAL LOW (ref >60)
GFR calc non Af Amer: 20 mL/min — ABNORMAL LOW (ref >60)
Glucose, Bld: 202 mg/dL — ABNORMAL HIGH (ref 70–99)
Potassium: 3.8 mmol/L (ref 3.5–5.1)
Sodium: 134 mmol/L — ABNORMAL LOW (ref 135–145)

## 2020-02-14 LAB — GLUCOSE, CAPILLARY
Glucose-Capillary: 213 mg/dL — ABNORMAL HIGH (ref 70–99)
Glucose-Capillary: 194 mg/dL — ABNORMAL HIGH (ref 70–99)
Glucose-Capillary: 334 mg/dL — ABNORMAL HIGH (ref 70–99)
Glucose-Capillary: 146 mg/dL — ABNORMAL HIGH (ref 70–99)
Glucose-Capillary: 118 mg/dL — ABNORMAL HIGH (ref 70–99)

## 2020-02-14 LAB — ANCA TITERS
Atypical P-ANCA titer: <1:20 {titer}
C-ANCA: <1:20 {titer}
P-ANCA: <1:20 {titer}

## 2020-02-14 LAB — IMMUNOFIXATION ELECTROPHORESIS
IgA: 243 mg/dL (ref 61–437)
IgG (Immunoglobin G), Serum: 832 mg/dL (ref 603–1613)
IgM (Immunoglobulin M), Srm: 49 mg/dL (ref 15–143)
Total Protein ELP: 5.4 g/dL — ABNORMAL LOW (ref 6.0–8.5)

## 2020-02-14 LAB — URINE CULTURE: Culture: >=100000 — AB

## 2020-02-14 LAB — COMPLEMENT, TOTAL: Compl, Total (CH50): >60 U/mL (ref >41)

## 2020-02-14 LAB — MAGNESIUM: Magnesium: 2.3 mg/dL (ref 1.7–2.4)

## 2020-02-14 MED ORDER — ALBUMIN HUMAN 25 % IV SOLN
25.0000 g | Freq: Two times a day (BID) | INTRAVENOUS | Status: AC
  Administered 2020-02-14 (×2): 25 g via INTRAVENOUS
  Filled 2020-02-14 (×2): qty 100

## 2020-02-14 NOTE — Progress Notes (Signed)
[facility]  ROUNDING NOTE   Subjective:   Wife at bedside. Patient worked with physical therapy this morning.   Creatinine 2.87 -> 2.92  IV albumin given today.   Objective:  Vital signs in last 24 hours:  Temp:  [98.1 F (36.7 C)-99.5 F (37.5 C)] 98.1 F (36.7 C) (06/29 1623) Pulse Rate:  [70-79] 70 (06/29 1623) Resp:  [18-20] 18 (06/29 1623) BP: (114-120)/(62-73) 120/73 (06/29 1623) SpO2:  [86 %-97 %] 94 % (06/29 1221)  Weight change:  Filed Weights   02/08/20 0212  Weight: 81.6 kg    Intake/Output: I/O last 3 completed shifts: In: 591.4 [P.O.:480; IV Piggyback:111.4] Out: 825 [Urine:825]   Intake/Output this shift:  Total I/O In: 600 [P.O.:600] Out: -   Physical Exam: General: NAD,   Head: Normocephalic, atraumatic. Moist oral mucosal membranes  Eyes: Anicteric, PERRL  Neck: Supple, trachea midline  Lungs:  Left base diminished  Heart: Regular rate and rhythm  Abdomen:  Soft, nontender,   Extremities:  + peripheral edema.  Neurologic: Nonfocal, moving all four extremities  Skin: No lesions        Basic Metabolic Panel: Recent Labs  Lab 02/10/20 0442 02/10/20 0442 02/11/20 0945 02/11/20 0945 02/12/20 0445 02/12/20 0752 02/13/20 0514 02/14/20 0441  NA 139  --  135  --  137  --  134* 134*  K 3.7  --  3.6  --  3.7  --  4.0 3.8  CL 101  --  98  --  99  --  95* 96*  CO2 26  --  28  --  29  --  24 26  GLUCOSE 222*  --  298*  --  147*  --  196* 202*  BUN 37*  --  48*  --  51*  --  64* 72*  CREATININE 1.94*  --  2.37*  --  2.56*  --  2.87* 2.92*  CALCIUM 8.3*   < > 8.3*   < > 8.3*  --  8.2* 8.2*  MG 1.6*  --  2.1  --  2.2  --  2.2 2.3  PHOS  --   --   --   --   --  4.0  --   --    < > = values in this interval not displayed.    Liver Function Tests: Recent Labs  Lab 02/13/20 0514  ALBUMIN 2.6*   No results for input(s): LIPASE, AMYLASE in the last 168 hours. No results for input(s): AMMONIA in the last 168  hours.  CBC: Recent Labs  Lab 02/08/20 0209 02/09/20 0012 02/10/20 0442 02/11/20 0945 02/12/20 0445 02/13/20 0514 02/14/20 0441  WBC 5.2   < > 7.7 7.8 6.6 6.5 5.5  NEUTROABS 3.7  --   --   --   --   --   --   HGB 9.9*   < > 8.6* 9.0* 8.6* 8.6* 8.3*  HCT 28.4*   < > 24.8* 24.9* 25.0* 24.7* 22.4*  MCV 92.5   < > 92.5 89.6 92.6 91.8 87.8  PLT 121*   < > 97* 97* 102* 111* 123*   < > = values in this interval not displayed.    Cardiac Enzymes: No results for input(s): CKTOTAL, CKMB, CKMBINDEX, TROPONINI in the last 168 hours.  BNP: Invalid input(s): POCBNP  CBG: Recent Labs  Lab 02/13/20 1652 02/13/20 2116 02/14/20 0755 02/14/20 1205 02/14/20 1621  GLUCAP 213* 214* 194* 334* 146*    Microbiology:  Results for orders placed or performed during the hospital encounter of 02/08/20  SARS Coronavirus 2 by RT PCR (hospital order, performed in [facility] hospital lab) Nasopharyngeal Nasopharyngeal Swab     Status: None   Collection Time: 02/08/20  5:26 AM   Specimen: Nasopharyngeal Swab  Result Value Ref Range Status   SARS Coronavirus 2 NEGATIVE NEGATIVE Final    Comment: (NOTE) SARS-CoV-2 target nucleic acids are NOT DETECTED.  The SARS-CoV-2 RNA is generally detectable in upper and lower respiratory specimens during the acute phase of infection. The lowest concentration of SARS-CoV-2 viral copies this assay can detect is 250 copies / mL. A negative result does not preclude SARS-CoV-2 infection and should not be used as the sole basis for treatment or other patient management decisions.  A negative result may occur with improper specimen collection / handling, submission of specimen other than nasopharyngeal swab, presence of viral mutation(s) within the areas targeted by this assay, and inadequate number of viral copies (<250 copies / mL). A negative result must be combined with clinical observations, patient history, and epidemiological information.  Fact Sheet for  Patients:   StrictlyIdeas.no  Fact Sheet for Healthcare Providers: BankingDealers.co.za  This test is not yet approved or  cleared by the [place] FDA and has been authorized for detection and/or diagnosis of SARS-CoV-2 by FDA under an Emergency Use Authorization (EUA).  This EUA will remain in effect (meaning this test can be used) for the duration of the COVID-19 declaration under Section 564(b)(1) of the Act, 21 U.S.C. section 360bbb-3(b)(1), unless the authorization is terminated or revoked sooner.  Performed at [facility], [address] [place], [place] [address]   Culture, blood (Routine X 2) w Reflex to ID Panel     Status: None (Preliminary result)   Collection Time: 02/10/20 10:24 AM   Specimen: BLOOD  Result Value Ref Range Status   Specimen Description BLOOD LEFT ANTECUBITAL  Final   Special Requests   Final    BOTTLES DRAWN AEROBIC AND ANAEROBIC Blood Culture adequate volume   Culture   Final    NO GROWTH 4 DAYS Performed at [facility], [address], [place], [place] [address]    Report Status PENDING  Incomplete  Culture, blood (Routine X 2) w Reflex to ID Panel     Status: None (Preliminary result)   Collection Time: 02/10/20 10:30 AM   Specimen: BLOOD  Result Value Ref Range Status   Specimen Description BLOOD BLOOD LEFT HAND  Final   Special Requests   Final    BOTTLES DRAWN AEROBIC AND ANAEROBIC Blood Culture results may not be optimal due to an excessive volume of blood received in culture bottles   Culture   Final    NO GROWTH 4 DAYS Performed at [facility], [address], [place], [place] [address]    Report Status PENDING  Incomplete  Urine Culture     Status: Abnormal   Collection Time: 02/11/20  4:08 PM   Specimen: Urine, Clean Catch  Result Value Ref Range Status   Specimen Description   Final    URINE, CLEAN CATCH Performed at [facility], [address] [place], [place] [address]    Special Requests   Final    NONE Performed at [facility], [address] [place], [place] [address]    Culture (A)  Final    >=100,000 COLONIES/mL KLEBSIELLA PNEUMONIAE >=100,000 COLONIES/mL ENTEROCOCCUS FAECALIS    Report Status  02/14/2020 FINAL  Final   Organism ID, Bacteria KLEBSIELLA PNEUMONIAE (A)  Final   Organism ID, Bacteria ENTEROCOCCUS FAECALIS (A)  Final      Susceptibility   Enterococcus faecalis - MIC*    AMPICILLIN <=2 SENSITIVE Sensitive     NITROFURANTOIN <=16 SENSITIVE Sensitive     VANCOMYCIN 1 SENSITIVE Sensitive     * >=100,000 COLONIES/mL ENTEROCOCCUS FAECALIS   Klebsiella pneumoniae - MIC*    AMPICILLIN >=32 RESISTANT Resistant     CEFAZOLIN <=4 SENSITIVE Sensitive     CEFTRIAXONE <=0.25 SENSITIVE Sensitive     CIPROFLOXACIN <=0.25 SENSITIVE Sensitive     GENTAMICIN <=1 SENSITIVE Sensitive     IMIPENEM <=0.25 SENSITIVE Sensitive     NITROFURANTOIN 64 INTERMEDIATE Intermediate     TRIMETH/SULFA <=20 SENSITIVE Sensitive     AMPICILLIN/SULBACTAM 4 SENSITIVE Sensitive     PIP/TAZO <=4 SENSITIVE Sensitive     * >=100,000 COLONIES/mL KLEBSIELLA PNEUMONIAE    Coagulation Studies: No results for input(s): LABPROT, INR in the last 72 hours.  Urinalysis: No results for input(s): COLORURINE, LABSPEC, PHURINE, GLUCOSEU, HGBUR, BILIRUBINUR, KETONESUR, PROTEINUR, UROBILINOGEN, NITRITE, LEUKOCYTESUR in the last 72 hours.  Invalid input(s): APPERANCEUR    Imaging: DG Chest 2 View  Result Date: 02/13/2020 CLINICAL DATA:  Congestive heart failure. EXAM: CHEST - 2 VIEW COMPARISON:  February 13, 2020 FINDINGS: The heart size remains enlarged. The patient is status post prior median sternotomy. There is a moderate-sized left-sided pleural effusion with adjacent atelectasis. There is no pneumothorax. There may be a small right-sided pleural effusion. There is vascular congestion. IMPRESSION: No significant interval  change. Persistent cardiomegaly with small to moderate-sized bilateral pleural effusions, left greater than right. Electronically Signed   By: Constance Holster M.D.   On: 02/13/2020 15:33   NM Pulmonary Perfusion  Result Date: 02/13/2020 CLINICAL DATA:  Hypoxemia. Pain and swelling in the left leg with left lower extremity DVT. Is going on here EXAM: NUCLEAR MEDICINE PERFUSION LUNG SCAN TECHNIQUE: Perfusion images were obtained in multiple projections after intravenous injection of radiopharmaceutical. Ventilation scans intentionally deferred if perfusion scan and chest x-ray adequate for interpretation during COVID 19 epidemic. RADIOPHARMACEUTICALS:  4.4 mCi Tc-78m MAA IV COMPARISON:  Chest x-ray earlier same day. FINDINGS: Multiplanar perfusion imaging shows no peripheral wedge-shaped perfusion defect in either lung. Decreased perfusion at the left base is compatible with the collapse/consolidation and small effusion seen on chest x-ray earlier today. IMPRESSION: Perfusion imaging is negative for pulmonary embolus. Electronically Signed   By: Misty Stanley M.D.   On: 02/13/2020 11:55   DG Chest Port 1 View  Result Date: 02/13/2020 CLINICAL DATA:  Blood clot in left leg.  History of COPD. EXAM: PORTABLE CHEST 1 VIEW COMPARISON:  02/12/2020 FINDINGS: There is bilateral diffuse mild interstitial thickening. There is a trace right pleural effusion. There is a small left pleural effusion. There is no pneumothorax. There is stable cardiomegaly. There is evidence of prior CABG. There is no acute osseous abnormality. IMPRESSION: Cardiomegaly with pulmonary vascular congestion. Trace right and small left pleural effusions. Electronically Signed   By: Kathreen Devoid   On: 02/13/2020 10:03     Medications:   . sodium chloride Stopped (02/12/20 1802)  . albumin human 25 g (02/14/20 1130)  . ampicillin-sulbactam (UNASYN) IV 1.5 g (02/14/20 1747)   . amLODipine  10 mg Oral Daily  . apixaban  10 mg Oral BID    Followed by  . [START ON 02/16/2020] apixaban  5 mg Oral  BID  . aspirin EC  81 mg Oral Daily  . carvedilol  6.25 mg Oral BID WC  . feeding supplement (ENSURE ENLIVE)  237 mL Oral BID BM  . ferrous sulfate  325 mg Oral BID WC  . insulin aspart  0-9 Units Subcutaneous TID WC  . insulin aspart protamine- aspart  5 Units Subcutaneous BID WC  . isosorbide mononitrate  30 mg Oral Daily  . mouth rinse  15 mL Mouth Rinse BID  . polyethylene glycol  17 g Oral BID   sodium chloride, acetaminophen, magnesium hydroxide, nitroGLYCERIN, ondansetron **OR** ondansetron (ZOFRAN) IV, oxyCODONE, traZODone  Assessment/ Plan:  Rick Zuniga is a 74 y.o. white male with COPD, insulin dependent diabetes mellitus type 2, coronary artery disease, hypertension, thrombocytopenia, who was admitted to [facility] on 02/08/2020 for AKI (acute kidney injury) ([place]) [N17.9] Acute deep vein thrombosis (DVT) of distal vein of left lower extremity ([place]) [I82.4Z2] Acute renal failure, unspecified acute renal failure type ([place]) [N17.9]  1. Acute kidney injury on chronic kidney disease stage IIIB with proteinuria: Baseline creatinine 1.8, GFR of 37.  Chronic kidney disease secondary to diabetic nephropathy.  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 No IV contrast exposure.   2. 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 Echo EF 45-50% with grade II diastolic dysfunction on 4/03.  BNP elevated  3. Diabetes mellitus type II with chronic kidney disease: insulin dependent. History of poor control. Hemoglobin A1c of 10.9 on 2/4.   4. Anemia of chronic kidney disease:  normocytic. Consistent with iron deficiency.   5. DVT: unknown chronicity.  - continue apixaban. Continue to monitor thrombocytopenia.     LOS: 6 Mariona Scholes 6/29/20216:45 PM

## 2020-02-14 NOTE — TOC Progression Note (Addendum)
Transition of Care [facility]) - Progression Note    Patient Details  Name: Rick Zuniga MRN: [id number] Date of Birth: Nov 29, 1945  Transition of Care [facility]) CM/SW Contact  Shelbie Hutching, RN Phone Number: 02/14/2020, 1:39 PM  Clinical Narrative:     Patient awake and alert today, significant other is at the bedside.  PT recommends home health and patient is in agreement.  Patient chooses [facility] given referral for HHPT and OT.   Patient will also need a rolling walker at discharge.  [employer] will provide walker.   Expected Discharge Plan: [place] Barriers to Discharge: Continued Medical Work up  Expected Discharge Plan and Services Expected Discharge Plan: [place]   Discharge Planning Services: CM Consult   Living arrangements for the past 2 months: Single Family Home                           HH Arranged: PT, OT [facility] Agency: [place] Date [place]: 02/14/20 Time [place]: 4680 Representative spoke with at [place]: [place] ([place]) Interventions    Readmission Risk Interventions No flowsheet data found.

## 2020-02-14 NOTE — Evaluation (Signed)
Physical Therapy Evaluation Patient Details Name: Rick Zuniga MRN: [id number] DOB: 1945-09-14 Today's Date: 02/14/2020   History of Present Illness  Rick Zuniga is a 61yoM , PMH: DM2, HTN, CAD, COPD, OA. c/o BLE pain/edema for 5 days. Left lower extremity doppler showed nonocclusive thrombus in the left common femoral vein, saphenofemoral junction and saphenous vein and uncertain acuity and no DVT in the femoral or popliteal vein.  Right lower extremity venous Dopplers is negative for DVT. Pt is in AKI c UTI. VQ scan done on 6/28 without evidence of PE.  Clinical Impression  Pt admitted with above diagnosis. Pt currently with functional limitations due to the deficits listed below (see "PT Problem List"). Upon entry, pt in bed, awake and agreeable to participate. The pt is alert and oriented x4, pleasant, conversational, and generally a good historian. Pain has been severely limiting to weightbearing the last few days, but today is improved at rest, as well as with use of RW for off-loading. Pt performs bed mobility, transfers, and AMB c RW at supervision level or better. Pt educated on safe performance of stairs with antalgia focus. Functional mobility assessment demonstrates increased effort/time requirements, poor tolerance, and need for physical assistance, whereas the patient performed these at a higher level of independence PTA. Pt will benefit from skilled PT intervention to increase independence and safety with basic mobility in preparation for discharge to the venue listed below.       Follow Up Recommendations Home health PT    Equipment Recommendations  Rolling walker with 5" wheels    Recommendations for Other Services       Precautions / Restrictions Precautions Precautions: Fall      Mobility  Bed Mobility Overal bed mobility: Modified Independent                Transfers Overall transfer level: Modified independent Equipment used: Rolling walker (2  wheeled)             General transfer comment: labored effort, no LOB  Ambulation/Gait   Gait Distance (Feet): 180 Feet Assistive device: Rolling walker (2 wheeled) Gait Pattern/deviations: Step-to pattern     General Gait Details: 3-point RW gait for antalgia  Stairs Stairs: Yes   Stair Management: One rail Left;Step to pattern;Sideways Number of Stairs: 4    Wheelchair Mobility    Modified Rankin (Stroke Patients Only)       Balance Overall balance assessment: Modified Independent;Mild deficits observed, not formally tested                                           Pertinent Vitals/Pain Pain Assessment: 0-10 Pain Score: 4  Pain Location: Left calf cramping Pain Intervention(s): Limited activity within patient's tolerance;Monitored during session;Repositioned    Home Living Family/patient expects to be discharged to:: Private residence Living Arrangements: Spouse/significant other Available Help at Discharge: Family Type of Home: House Home Access: Stairs to enter Entrance Stairs-Rails: Left Entrance Stairs-Number of Steps: 4 in garage Home Layout: One level Home Equipment: None      Prior Function Level of Independence: Independent               Hand Dominance        Extremity/Trunk Assessment   Upper Extremity Assessment Upper Extremity Assessment: Overall WFL for tasks assessed    Lower Extremity Assessment Lower Extremity Assessment: Overall WFL for tasks  assessed;Generalized weakness;LLE deficits/detail (swelling, pain)       Communication      Cognition Arousal/Alertness: Awake/alert Behavior During Therapy: WFL for tasks assessed/performed Overall Cognitive Status: Within Functional Limits for tasks assessed                                        General Comments      Exercises     Assessment/Plan    PT Assessment Patient needs continued PT services  PT Problem List Decreased  strength;Decreased range of motion;Decreased activity tolerance;Decreased balance;Decreased mobility;Decreased knowledge of precautions;Decreased knowledge of use of DME       PT Treatment Interventions DME instruction;Gait training;Stair training;Functional mobility training;Therapeutic activities;Therapeutic exercise;Patient/family education    PT Goals (Current goals can be found in the Care Plan section)  Acute Rehab PT Goals Patient Stated Goal: reduce pain in leg PT Goal Formulation: With patient Time For Goal Achievement: 02/28/20 Potential to Achieve Goals: Good    Frequency Min 2X/week   Barriers to discharge        Co-evaluation               AM-PAC PT "6 Clicks" Mobility  Outcome Measure Help needed turning from your back to your side while in a flat bed without using bedrails?: None Help needed moving from lying on your back to sitting on the side of a flat bed without using bedrails?: None Help needed moving to and from a bed to a chair (including a wheelchair)?: None Help needed standing up from a chair using your arms (e.g., wheelchair or bedside chair)?: None Help needed to walk in hospital room?: A Little Help needed climbing 3-5 steps with a railing? : A Little 6 Click Score: 22    End of Session Equipment Utilized During Treatment: Gait belt;Oxygen Activity Tolerance: Patient tolerated treatment well;No increased pain;Patient limited by fatigue Patient left: in bed;with family/visitor present;with nursing/sitter in room;with call bell/phone within reach Nurse Communication: Mobility status PT Visit Diagnosis: Difficulty in walking, not elsewhere classified (R26.2)    Time: 9169-4503 PT Time Calculation (min) (ACUTE ONLY): 32 min   Charges:   PT Evaluation $PT Eval Moderate Complexity: 1 Mod PT Treatments $Gait Training: 8-22 mins        12:01 PM, 02/14/20 Etta Grandchild, PT, DPT Physical Therapist - [facility]  [phone] ([place])   Enigma C 02/14/2020, 11:58 AM

## 2020-02-14 NOTE — Progress Notes (Signed)
PROGRESS NOTE    Rick Zuniga  JOI:[id number] DOB: 1946-02-18 DOA: 02/08/2020 PCP: [name], MD    Assessment & Plan:   Active Problems:   AKI (acute kidney injury) ([place])    Rick Zuniga  is a 74 y.o. Caucasian male with a known history of type 2 diabetes mellitus, hypertension, coronary artery disease, COPD and osteoarthritis, presented to the emergency room with acute onset of bilateral lower extremity pain and swelling which have been worsening over the last 5 days.  Prior to that the patient had an eye procedure that required IV contrast.  He had no recent long travels or other surgeries.  He stated that he stayed in a recliner all day yesterday.  He usually has good pain intolerance and was shouting in pain per his wife.  He has chronic diarrhea but denied any nausea or vomiting or abdominal pain.  No dysuria, oliguria or hematuria or flank pain.  He has been having chills and recent cold intolerance.  He admitted to recent dyspnea on exertion with orthopnea and paroxysmal nocturnal dyspnea with no chest pain or palpitations.   # Dyspnea on exertion and orthopnea with bilateral lower extremity edema. 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  BNP 1636. --Intermittently received IV lasix PLAN: --IV lasix 60 mg x1 today --IV albumin 25g BID today --Strict I/O  # Acute kidney injury likely due to cardio-renal syndrome # On CKD3b  --Cr 2.23 on presentation.  Cr didn't improve much after initial MIVF.  Since pt has BLE swelling and DOE, pt may have congestion instead (cardio-renal syndrome). --Nephrology consulted  --Cr continues to worsen, up to 2.92 this morning.   PLAN: --IV lasix 60 mg x1 today --IV albumin 25g BID today --Strict I/O  # Left lower  extremity DVT. -Uncertain chronicity.  Started on IV heparin on admission, transitioned to Eliquis --V/Q scan (avoid contrast 2/2 AKI), neg for PE PLAN: -continue Eliquis   # LLE pain --spastic, and crampy in nature.  Cause unclear.   --continue oxycodone PRN  4.  Hypertension. -Continue amlodipine, Coreg, Imdur and hold off Cozaar given acute kidney injury.  5.  Type 2 diabetes mellitus. Hypoglycemic episode -Had BG 27.  Pt's home med was listed as Novolin 70/30 10u BID, however, pt reported taking 5u BID. PLAN: --continue 70/30 as 5u BID --ACHS and SSI  6.  Coronary artery disease. -continue home Imdur and Coreg.  Fever  Presumed due to UTI --up to 102.8.  2 nights in a row.   --Procal 0.2 --blood cx x2 neg so far --UA neg, but urine cx grew Klebsiella and Enterococcus, sensitivities pending, started on Unasyn --V/Q scan (avoid contrast 2/2 AKI), neg for PE PLAN: --continue Unasyn   # N/V and abdominal pain, likely due to constipation, resolved --symptoms started night of 6/24 and lasted about 2 days.  No diarrhea.  No BM for at least 4 days. --KUB 6/26, no acute finding --Miralax BID   DVT prophylaxis: OB:[id number] Code Status: Full code  Family Communication: fiance updated at the bedside Status is: inpatient Dispo:   The patient is from: home Anticipated d/c is to: home Anticipated d/c date is: unclear Patient currently is not medically stable to d/c due to: worsening Cr, AKI managed by nephrology.  Likely has CHF exacerbation.  Treating fever,  presumed to be of urinary origin.   Subjective and Interval History:  Pt reported pain in his left leg improved today.  Had a BM finally, and reported eating more.  Reported lots urine output, though not reflected in the I/O charting.  No more abdominal pain, N/V.  No fever, dyspnea, chest pain, abdominal pain, diarrhea, dysuria.    Objective: Vitals:   02/14/20 1146 02/14/20 1221 02/14/20 1623 02/14/20 1931  BP:   119/63 120/73 110/61  Pulse:  75 70 69  Resp:  18 18 17   Temp:  98.1 F (36.7 C) 98.1 F (36.7 C) 98.1 F (36.7 C)  TempSrc:      SpO2: 96% 94%  98%  Weight:      Height:        Intake/Output Summary (Last 24 hours) at 02/14/2020 2110 Last data filed at 02/14/2020 1924 Gross per 24 hour  Intake 1430 ml  Output 1425 ml  Net 5 ml   Filed Weights   02/08/20 0212  Weight: 81.6 kg    Examination:   Constitutional: NAD, AAOx3, standing in front of sink HEENT: conjunctivae and lids normal, EOMI CV: RRR no M,R,G. Distal pulses +2.  No cyanosis.   RESP: decreased lung sounds over posterior LLL, normal respiratory effort, on RA GI: +BS, NTND, soft Extremities: 2+ pitting edema in BLE SKIN: warm, dry and intact Neuro: II - XII grossly intact.  Sensation intact Psych: better mood and affect.     Data Reviewed: I have personally reviewed following labs and imaging studies  CBC: Recent Labs  Lab 02/08/20 0209 02/09/20 0012 02/10/20 0442 02/11/20 0945 02/12/20 0445 02/13/20 0514 02/14/20 0441  WBC 5.2   < > 7.7 7.8 6.6 6.5 5.5  NEUTROABS 3.7  --   --   --   --   --   --   HGB 9.9*   < > 8.6* 9.0* 8.6* 8.6* 8.3*  HCT 28.4*   < > 24.8* 24.9* 25.0* 24.7* 22.4*  MCV 92.5   < > 92.5 89.6 92.6 91.8 87.8  PLT 121*   < > 97* 97* 102* 111* 123*   < > = values in this interval not displayed.   Basic Metabolic Panel: Recent Labs  Lab 02/10/20 0442 02/11/20 0945 02/12/20 0445 02/12/20 0752 02/13/20 0514 02/14/20 0441  NA 139 135 137  --  134* 134*  K 3.7 3.6 3.7  --  4.0 3.8  CL 101 98 99  --  95* 96*  CO2 26 28 29   --  24 26  GLUCOSE 222* 298* 147*  --  196* 202*  BUN 37* 48* 51*  --  64* 72*  CREATININE 1.94* 2.37* 2.56*  --  2.87* 2.92*  CALCIUM 8.3* 8.3* 8.3*  --  8.2* 8.2*  MG 1.6* 2.1 2.2  --  2.2 2.3  PHOS  --   --   --  4.0  --   --    GFR: Estimated Creatinine Clearance: 24.7 mL/min (A) (by C-G formula based on SCr of 2.92 mg/dL (H)). Liver Function  Tests: Recent Labs  Lab 02/13/20 0514  ALBUMIN 2.6*   No results for input(s): LIPASE, AMYLASE in the last 168 hours. No results for input(s): AMMONIA in the last 168 hours. Coagulation Profile: Recent Labs  Lab 02/08/20 0209  INR 1.1   Cardiac Enzymes: No results for input(s): CKTOTAL, CKMB, CKMBINDEX, TROPONINI in the last 168 hours. BNP (last 3 results) No results for input(s): PROBNP in the  last 8760 hours. HbA1C: No results for input(s): HGBA1C in the last 72 hours. CBG: Recent Labs  Lab 02/13/20 2116 02/14/20 0755 02/14/20 1205 02/14/20 1621 02/14/20 2056  GLUCAP 214* 194* 334* 146* 118*   Lipid Profile: No results for input(s): CHOL, HDL, LDLCALC, TRIG, CHOLHDL, LDLDIRECT in the last 72 hours. Thyroid Function Tests: No results for input(s): TSH, T4TOTAL, FREET4, T3FREE, THYROIDAB in the last 72 hours. Anemia Panel: No results for input(s): VITAMINB12, FOLATE, FERRITIN, TIBC, IRON, RETICCTPCT in the last 72 hours. Sepsis Labs: Recent Labs  Lab 02/10/20 1024 02/11/20 0945 02/12/20 0445  PROCALCITON 0.20 0.30 0.32    Recent Results (from the past 240 hour(s))  SARS Coronavirus 2 by RT PCR (hospital order, performed in [facility] hospital lab) Nasopharyngeal Nasopharyngeal Swab     Status: None   Collection Time: 02/08/20  5:26 AM   Specimen: Nasopharyngeal Swab  Result Value Ref Range Status   SARS Coronavirus 2 NEGATIVE NEGATIVE Final    Comment: (NOTE) SARS-CoV-2 target nucleic acids are NOT DETECTED.  The SARS-CoV-2 RNA is generally detectable in upper and lower respiratory specimens during the acute phase of infection. The lowest concentration of SARS-CoV-2 viral copies this assay can detect is 250 copies / mL. A negative result does not preclude SARS-CoV-2 infection and should not be used as the sole basis for treatment or other patient management decisions.  A negative result may occur with improper specimen collection / handling, submission of  specimen other than nasopharyngeal swab, presence of viral mutation(s) within the areas targeted by this assay, and inadequate number of viral copies (<250 copies / mL). A negative result must be combined with clinical observations, patient history, and epidemiological information.  Fact Sheet for Patients:   StrictlyIdeas.no  Fact Sheet for Healthcare Providers: BankingDealers.co.za  This test is not yet approved or  cleared by the [place] FDA and has been authorized for detection and/or diagnosis of SARS-CoV-2 by FDA under an Emergency Use Authorization (EUA).  This EUA will remain in effect (meaning this test can be used) for the duration of the COVID-19 declaration under Section 564(b)(1) of the Act, 21 U.S.C. section 360bbb-3(b)(1), unless the authorization is terminated or revoked sooner.  Performed at [facility], [address] [place], [place] [address]   Culture, blood (Routine X 2) w Reflex to ID Panel     Status: None (Preliminary result)   Collection Time: 02/10/20 10:24 AM   Specimen: BLOOD  Result Value Ref Range Status   Specimen Description BLOOD LEFT ANTECUBITAL  Final   Special Requests   Final    BOTTLES DRAWN AEROBIC AND ANAEROBIC Blood Culture adequate volume   Culture   Final    NO GROWTH 4 DAYS Performed at [facility], [address] [place], [place] [address]    Report Status PENDING  Incomplete  Culture, blood (Routine X 2) w Reflex to ID Panel     Status: None (Preliminary result)   Collection Time: 02/10/20 10:30 AM   Specimen: BLOOD  Result Value Ref Range Status   Specimen Description BLOOD BLOOD LEFT HAND  Final   Special Requests   Final    BOTTLES DRAWN AEROBIC AND ANAEROBIC Blood Culture results may not be optimal due to an excessive volume of blood received in culture bottles   Culture   Final    NO GROWTH 4 DAYS Performed at [facility], [address] [place],  [address]    Report Status  PENDING  Incomplete  Urine Culture     Status: Abnormal   Collection Time: 02/11/20  4:08 PM   Specimen: Urine, Clean Catch  Result Value Ref Range Status   Specimen Description   Final    URINE, CLEAN CATCH Performed at [facility], [address] [place], [place] [address]    Special Requests   Final    NONE Performed at [facility], [place], [place] [address]    Culture (A)  Final    >=100,000 COLONIES/mL KLEBSIELLA PNEUMONIAE >=100,000 COLONIES/mL ENTEROCOCCUS FAECALIS    Report Status 02/14/2020 FINAL  Final   Organism ID, Bacteria KLEBSIELLA PNEUMONIAE (A)  Final   Organism ID, Bacteria ENTEROCOCCUS FAECALIS (A)  Final      Susceptibility   Enterococcus faecalis - MIC*    AMPICILLIN <=2 SENSITIVE Sensitive     NITROFURANTOIN <=16 SENSITIVE Sensitive     VANCOMYCIN 1 SENSITIVE Sensitive     * >=100,000 COLONIES/mL ENTEROCOCCUS FAECALIS   Klebsiella pneumoniae - MIC*    AMPICILLIN >=32 RESISTANT Resistant     CEFAZOLIN <=4 SENSITIVE Sensitive     CEFTRIAXONE <=0.25 SENSITIVE Sensitive     CIPROFLOXACIN <=0.25 SENSITIVE Sensitive     GENTAMICIN <=1 SENSITIVE Sensitive     IMIPENEM <=0.25 SENSITIVE Sensitive     NITROFURANTOIN 64 INTERMEDIATE Intermediate     TRIMETH/SULFA <=20 SENSITIVE Sensitive     AMPICILLIN/SULBACTAM 4 SENSITIVE Sensitive     PIP/TAZO <=4 SENSITIVE Sensitive     * >=100,000 COLONIES/mL KLEBSIELLA PNEUMONIAE      Radiology Studies: DG Chest 2 View  Result Date: 02/13/2020 CLINICAL DATA:  Congestive heart failure. EXAM: CHEST - 2 VIEW COMPARISON:  February 13, 2020 FINDINGS: The heart size remains enlarged. The patient is status post prior median sternotomy. There is a moderate-sized left-sided pleural effusion with adjacent atelectasis. There is no pneumothorax. There may be a small right-sided pleural effusion. There is vascular congestion. IMPRESSION: No  significant interval change. Persistent cardiomegaly with small to moderate-sized bilateral pleural effusions, left greater than right. Electronically Signed   By: Constance Holster M.D.   On: 02/13/2020 15:33   NM Pulmonary Perfusion  Result Date: 02/13/2020 CLINICAL DATA:  Hypoxemia. Pain and swelling in the left leg with left lower extremity DVT. Is going on here EXAM: NUCLEAR MEDICINE PERFUSION LUNG SCAN TECHNIQUE: Perfusion images were obtained in multiple projections after intravenous injection of radiopharmaceutical. Ventilation scans intentionally deferred if perfusion scan and chest x-ray adequate for interpretation during COVID 19 epidemic. RADIOPHARMACEUTICALS:  4.4 mCi Tc-69m MAA IV COMPARISON:  Chest x-ray earlier same day. FINDINGS: Multiplanar perfusion imaging shows no peripheral wedge-shaped perfusion defect in either lung. Decreased perfusion at the left base is compatible with the collapse/consolidation and small effusion seen on chest x-ray earlier today. IMPRESSION: Perfusion imaging is negative for pulmonary embolus. Electronically Signed   By: Misty Stanley M.D.   On: 02/13/2020 11:55   DG Chest Port 1 View  Result Date: 02/13/2020 CLINICAL DATA:  Blood clot in left leg.  History of COPD. EXAM: PORTABLE CHEST 1 VIEW COMPARISON:  02/12/2020 FINDINGS: There is bilateral diffuse mild interstitial thickening. There is a trace right pleural effusion. There is a small left pleural effusion. There is no pneumothorax. There is stable cardiomegaly. There is evidence of prior CABG. There is no acute osseous abnormality. IMPRESSION: Cardiomegaly with pulmonary vascular congestion. Trace right and small left pleural effusions. Electronically Signed   By: Kathreen Devoid  On: 02/13/2020 10:03     Scheduled Meds: . amLODipine  10 mg Oral Daily  . apixaban  10 mg Oral BID   Followed by  . [START ON 02/16/2020] apixaban  5 mg Oral BID  . aspirin EC  81 mg Oral Daily  . carvedilol  6.25 mg Oral  BID WC  . feeding supplement (ENSURE ENLIVE)  237 mL Oral BID BM  . ferrous sulfate  325 mg Oral BID WC  . insulin aspart  0-9 Units Subcutaneous TID WC  . insulin aspart protamine- aspart  5 Units Subcutaneous BID WC  . isosorbide mononitrate  30 mg Oral Daily  . mouth rinse  15 mL Mouth Rinse BID  . polyethylene glycol  17 g Oral BID   Continuous Infusions: . sodium chloride Stopped (02/12/20 1802)  . albumin human Stopped (02/14/20 1958)  . ampicillin-sulbactam (UNASYN) IV 1.5 g (02/14/20 1747)     LOS: 6 days     Enzo Bi, MD [facility] If 7PM-7AM, please contact night-coverage 02/14/2020, 9:10 PM

## 2020-02-15 LAB — CBC
HCT: 23.4 % — ABNORMAL LOW (ref 39.0–52.0)
Hemoglobin: 8.6 g/dL — ABNORMAL LOW (ref 13.0–17.0)
MCH: 32.2 pg (ref 26.0–34.0)
MCHC: 36.8 g/dL — ABNORMAL HIGH (ref 30.0–36.0)
MCV: 87.6 fL (ref 80.0–100.0)
Platelets: 134 10*3/uL — ABNORMAL LOW (ref 150–400)
RBC: 2.67 MIL/uL — ABNORMAL LOW (ref 4.22–5.81)
RDW: 12.1 % (ref 11.5–15.5)
WBC: 5.7 10*3/uL (ref 4.0–10.5)
nRBC: 0 % (ref 0.0–0.2)

## 2020-02-15 LAB — BASIC METABOLIC PANEL
Anion gap: 14 (ref 5–15)
BUN: 74 mg/dL — ABNORMAL HIGH (ref 8–23)
CO2: 26 mmol/L (ref 22–32)
Calcium: 8.4 mg/dL — ABNORMAL LOW (ref 8.9–10.3)
Chloride: 95 mmol/L — ABNORMAL LOW (ref 98–111)
Creatinine, Ser: 2.72 mg/dL — ABNORMAL HIGH (ref 0.61–1.24)
GFR calc Af Amer: 25 mL/min — ABNORMAL LOW (ref >60)
GFR calc non Af Amer: 22 mL/min — ABNORMAL LOW (ref >60)
Glucose, Bld: 159 mg/dL — ABNORMAL HIGH (ref 70–99)
Potassium: 3.6 mmol/L (ref 3.5–5.1)
Sodium: 135 mmol/L (ref 135–145)

## 2020-02-15 LAB — CULTURE, BLOOD (ROUTINE X 2)
Culture: NO GROWTH
Culture: NO GROWTH
Special Requests: ADEQUATE

## 2020-02-15 LAB — GLUCOSE, CAPILLARY
Glucose-Capillary: 169 mg/dL — ABNORMAL HIGH (ref 70–99)
Glucose-Capillary: 167 mg/dL — ABNORMAL HIGH (ref 70–99)

## 2020-02-15 LAB — MAGNESIUM: Magnesium: 2.4 mg/dL (ref 1.7–2.4)

## 2020-02-15 MED ORDER — AMOXICILLIN-POT CLAVULANATE 500-125 MG PO TABS: 1.0000 | ORAL_TABLET | Freq: Two times a day (BID) | ORAL | 0 refills | Status: DC

## 2020-02-15 MED ORDER — FERROUS SULFATE 325 (65 FE) MG PO TABS: 325.0000 mg | ORAL_TABLET | Freq: Two times a day (BID) | ORAL | 1 refills | Status: DC

## 2020-02-15 MED ORDER — ENSURE ENLIVE PO LIQD: 237.0000 mL | Freq: Two times a day (BID) | ORAL | 12 refills | Status: DC

## 2020-02-15 MED ORDER — AMOXICILLIN-POT CLAVULANATE 500-125 MG PO TABS
1.0000 | ORAL_TABLET | Freq: Two times a day (BID) | ORAL | Status: DC
  Administered 2020-02-15: 500 mg via ORAL
  Filled 2020-02-15 (×2): qty 1

## 2020-02-15 MED ORDER — APIXABAN 5 MG PO TABS: 5.0000 mg | ORAL_TABLET | Freq: Two times a day (BID) | ORAL | 1 refills | Status: DC

## 2020-02-15 NOTE — TOC Transition Note (Signed)
Transition of Care [facility]) - CM/SW Discharge Note   Patient Details  Name: Rick Zuniga MRN: [id number] Date of Birth: [birth year]  Transition of Care [facility]) CM/SW Contact:  Shelbie Hutching, RN Phone Number: 02/15/2020, 12:30 PM   Clinical Narrative:    Patient has been medically cleared by nephrology and hospitalist for discharge home.  Patient is set up with home health services through [facility] for RN and PT.  Patient will need a rolling walker which will be provided by [employer] and brought up to the room before patient leaves.  Significant other Letta Median will provide transportation.    Final next level of care: [place] Barriers to Discharge: Barriers Resolved   Patient Goals and CMS Choice Patient states their goals for this hospitalization and ongoing recovery are:: Pt sleeping CMS Medicare.gov Compare Post Acute Care list provided to:: Patient Choice offered to / list presented to : Patient  Discharge Placement                       Discharge Plan and Services   Discharge Planning Services: CM Consult            DME Arranged: Gilford Rile rolling DME Agency: [employer] Date DME Agency Contacted: 02/15/20 Time DME Agency Contacted: 1229 Representative spoke with at DME Agency: [place]: PT, RN [facility] Agency: [place] Date [place]: 02/15/20 Time [place]: 12 Representative spoke with at [place]: [place] ([place]) Interventions     Readmission Risk Interventions No flowsheet data found.

## 2020-02-15 NOTE — Discharge Summary (Signed)
Physician Discharge Summary  [name]:[id number] DOB: 1946-03-17 DOA: 02/08/2020  PCP: [name], MD  Admit date: 02/08/2020 Discharge date: 02/15/2020  Admitted From: home Disposition:  home  Recommendations for Outpatient Follow-up:  1. Follow up with PCP in 1-2 weeks 2. Please obtain BMP/CBC in one week 3. Please follow up on patient's blood pressure.  Losartan was discontinued on discharge, because BP's were normal with it held.  Please resume if BP tolerates and clinically indicated.  Home Health: Yes, RN and PT  Equipment/Devices: rolling walker   Discharge Condition: Stable  CODE STATUS: Full  Diet recommendation: Regular    Discharge Diagnoses: Active Problems:   AKI (acute kidney injury) [facility])    Summary of HPI and Hospital Course:  Rick Zuniga  is a 74 y.o. Caucasian male with a known history of type 2 diabetes mellitus, hypertension, coronary artery disease, COPD and osteoarthritis, presented to the ED on 02/08/2020 with worsening bilateral lower extremity pain and swelling over the past 5 days.  Prior to that the patient had an eye procedure that required IV contrast.  He had no recent long travels or other surgeries.  He stated that he stayed in a recliner all day yesterday.  He usually has good pain tolerance and was shouting in pain per his wife.  He has chronic diarrhea but denied any nausea or vomiting or abdominal pain.  No dysuria, oliguria or hematuria or flank pain.  He has been having chills and recent cold intolerance.  He admitted to recent dyspnea on exertion with orthopnea and paroxysmal nocturnal dyspnea with no chest pain or palpitations.   Dyspnea on exertion and orthopnea with bilateral lower extremity edema. 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  BNP elevated 1636. Treated with IV Lasix with improvement.    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  Cr didn't improve much after initial fluids, suspect multifactorial with recent IV contrast exposure, cardio-renal physiology, and possibly some obstructive uropathy due to chronic cystisis.  Nephrology was consulted.  Close follow up outpatient.  Left lower extremity DVT -Uncertain chronicity.  Started on IV heparin on admission, transitioned to Eliquis --V/Q scan (avoid contrast 2/2 AKI), neg for PE --discharged on Eliquis  LLE pain suspect due to DVT, cramping in nature.  Treated with PRN oxycodone with relief.  Hypertension Continue amlodipine, Coreg, Imdur and hold off Cozaar given acute kidney injury.  Type 2 diabetes mellitus - takes Novolin 70/30 10u BID, he reported taking only 5u BID. Hypoglycemic episode had BG 27.   Was continued on 70/30 as 5u BID during admission.  Coronary artery disease continue home Imdur and Coreg.  Fever due to UTI - temp up to 102.8.  2 nights in a row.  Procal 0.2.  Blood cx x2 neg to date. UA neg, but urine cx grew Klebsiella and Enterococcus.  He was started on Unasyn and discharge with 4 remaining days of Augmentin to complete the course.    N/V and abdominal pain, likely due to constipation, resolved  --symptoms started night of 6/24 and lasted about 2 days.  No diarrhea.  No BM for at least 4 days.  KUB 6/26, no acute finding.  Miralax BID    Discharge Instructions  Discharge Instructions    Call MD for:  extreme fatigue   Complete by: As directed    Call MD for:  persistant dizziness or light-headedness   Complete by: As directed    Call MD for:  severe uncontrolled pain   Complete by: As directed    Call MD for:  temperature >100.4   Complete by: As directed    Diet - low sodium heart healthy   Complete by:  As directed    Discharge instructions   Complete by: As directed    Take Augmentin (antibiotic) twice daily until completely finished. Take Eliquis twice daily for blood clot in your leg.  Please see your PCP for refills.   Increase activity slowly   Complete by: As directed    Walker rolling   Complete by: As directed      Allergies as of 02/15/2020      Reactions   Pioglitazone Swelling   Metformin And Related Diarrhea   Other    Pt reported that he is allergic to Ace wrap.  Reaction include severe skin irritation.   Atorvastatin Rash   Benazepril Rash   Latex Rash   (tape only)   Tape Rash      Medication List    STOP taking these medications   losartan 100 MG tablet Commonly known as: COZAAR     TAKE these medications   amLODipine 10 MG tablet Commonly known as: NORVASC Take 1 tablet (10 mg total) by mouth daily.   amoxicillin-clavulanate 500-125 MG tablet Commonly known as: AUGMENTIN Take 1 tablet (500 mg total) by mouth 2 (two) times daily.   apixaban 5 MG Tabs tablet Commonly known as: ELIQUIS Take 1 tablet (5 mg total) by mouth 2 (two) times daily. Start taking on: February 16, 2020   aspirin EC 81 MG tablet Take 81 mg by mouth daily.   carvedilol 6.25 MG tablet Commonly known as: COREG Take 6.25 mg by mouth 2 (two) times daily with a meal.   feeding supplement (ENSURE ENLIVE) Liqd Take 237 mLs by mouth 2 (two) times daily between meals.   ferrous sulfate 325 (65 FE) MG tablet Take 1 tablet (325 mg total) by mouth 2 (two) times daily with a meal.   glipiZIDE 10 MG tablet Commonly known as: GLUCOTROL Take 10 mg by mouth 2 (two) times daily.   insulin NPH-regular Human (70-30) 100 UNIT/ML injection Inject 10 Units into the skin 2 (two) times daily.   isosorbide mononitrate 30 MG 24 hr tablet Commonly known as: IMDUR Take 30 mg by mouth daily.   nitroGLYCERIN 0.4 MG SL tablet Commonly known as: NITROSTAT Place 0.4 mg under the tongue every 5  (five) minutes as needed for chest pain.   torsemide 20 MG tablet Commonly known as: DEMADEX Take 20 mg by mouth 2 (two) times daily.            Durable Medical Equipment  (From admission, onward)         Start     Ordered   02/14/20 1344  For home use only DME Walker rolling  Once       Question Answer Comment  Walker: With 5 Inch Wheels   Patient needs a walker to treat with the following condition Weakness generalized      02/14/20 1343          Allergies  Allergen Reactions  . Pioglitazone Swelling  . Metformin And Related Diarrhea  . Other  Pt reported that he is allergic to Ace wrap.  Reaction include severe skin irritation.  . Atorvastatin Rash  . Benazepril Rash  . Latex Rash    (tape only)  . Tape Rash    Consultations:  nephrology    Procedures/Studies: DG Chest 2 View  Result Date: 02/13/2020 CLINICAL DATA:  Congestive heart failure. EXAM: CHEST - 2 VIEW COMPARISON:  February 13, 2020 FINDINGS: The heart size remains enlarged. The patient is status post prior median sternotomy. There is a moderate-sized left-sided pleural effusion with adjacent atelectasis. There is no pneumothorax. There may be a small right-sided pleural effusion. There is vascular congestion. IMPRESSION: No significant interval change. Persistent cardiomegaly with small to moderate-sized bilateral pleural effusions, left greater than right. Electronically Signed   By: Constance Holster M.D.   On: 02/13/2020 15:33   DG Abd 1 View  Result Date: 02/11/2020 CLINICAL DATA:  Abdominal pain and nausea for 1 week. EXAM: ABDOMEN - 1 VIEW COMPARISON:  CT, 05/25/2019 FINDINGS: Normal bowel gas pattern. No evidence of renal or ureteral stones. Arterial vascular calcifications are noted in the pelvis and along the femoral arteries. Right upper quadrant surgical vascular clips from a prior cholecystectomy. Soft tissues are otherwise unremarkable. No acute skeletal abnormality. There is opacity at  the left lung base, incompletely visualized. IMPRESSION: 1. No acute findings.  Normal bowel gas pattern. Electronically Signed   By: Lajean Manes M.D.   On: 02/11/2020 15:20   NM Pulmonary Perfusion  Result Date: 02/13/2020 CLINICAL DATA:  Hypoxemia. Pain and swelling in the left leg with left lower extremity DVT. Is going on here EXAM: NUCLEAR MEDICINE PERFUSION LUNG SCAN TECHNIQUE: Perfusion images were obtained in multiple projections after intravenous injection of radiopharmaceutical. Ventilation scans intentionally deferred if perfusion scan and chest x-ray adequate for interpretation during COVID 19 epidemic. RADIOPHARMACEUTICALS:  4.4 mCi Tc-21m MAA IV COMPARISON:  Chest x-ray earlier same day. FINDINGS: Multiplanar perfusion imaging shows no peripheral wedge-shaped perfusion defect in either lung. Decreased perfusion at the left base is compatible with the collapse/consolidation and small effusion seen on chest x-ray earlier today. IMPRESSION: Perfusion imaging is negative for pulmonary embolus. Electronically Signed   By: Misty Stanley M.D.   On: 02/13/2020 11:55   US RENAL  Result Date: 02/11/2020 CLINICAL DATA:  Acute tubular necrosis. EXAM: RENAL / URINARY TRACT ULTRASOUND COMPLETE COMPARISON:  None. FINDINGS: Right Kidney: Renal measurements: 11.0 x 4.9 x 5.6 cm = volume: 157.8 mL. Mild increased parenchymal echogenicity. No mass, stone or hydronephrosis. Left Kidney: Renal measurements: 11.2 x 5.2 x 5.1 cm = volume: 156.3 mL. Mild increased parenchymal echogenicity. No mass, stone or hydronephrosis. Bladder: Mild bladder wall thickening and irregularity. No bladder mass or stone. Pre void volume 334.2 mL. Patient was unable to void. Other: None. IMPRESSION: 1. Mild bladder wall thickening. Consider cystitis in the proper clinical setting. This could be due to chronic bladder outlet obstruction. 2. Mild increased bilateral renal parenchymal echogenicity consistent with medical renal disease.  No masses, stones or hydronephrosis. Electronically Signed   By: Lajean Manes M.D.   On: 02/11/2020 12:28   US Venous Img Lower Unilateral Left  Result Date: 02/08/2020 CLINICAL DATA:  Left swelling and pain for 2 days. EXAM: LEFT LOWER EXTREMITY VENOUS DOPPLER ULTRASOUND TECHNIQUE: Gray-scale sonography with compression, as well as color and duplex ultrasound, were performed to evaluate the deep venous system(s) from the level of the common femoral vein through the popliteal and proximal calf veins. COMPARISON:  None.  FINDINGS: VENOUS There is nonocclusive thrombus within the common femoral vein and at the saphenofemoral junction. Thrombus appears eccentric. Thrombus extends to involve the saphenous vein. Normal compressibility of the superficial femoral and popliteal veins, as well as the visualized calf veins. Visualized portions of profunda femoral vein and great saphenous vein unremarkable. No filling defects to suggest DVT on grayscale or color Doppler imaging. Doppler waveforms show normal direction of venous flow, normal respiratory plasticity and response to augmentation. Limited views of the contralateral common femoral vein are unremarkable. OTHER Soft tissue edema in the calf. Limitations: None IMPRESSION: 1. Positive for nonocclusive thrombus in the left common femoral vein, saphenofemoral junction and saphenous vein. Acuity is uncertain as this appears eccentric, however no prior exams are available for comparison. 2. No DVT in the femoral or popliteal veins. These results were called by telephone at the time of interpretation on 02/08/2020 at 3:45 am to Dr Marjean [name] , who verbally acknowledged these results. Electronically Signed   By: Keith Rake M.D.   On: 02/08/2020 03:46   US Venous Img Lower Unilateral Right  Result Date: 02/08/2020 CLINICAL DATA:  Pain and swelling in the legs. Left lower extremity DVT. EXAM: Right LOWER EXTREMITY VENOUS DOPPLER ULTRASOUND TECHNIQUE: Gray-scale  sonography with compression, as well as color and duplex ultrasound, were performed to evaluate the deep venous system(s) from the level of the common femoral vein through the popliteal and proximal calf veins. COMPARISON:  Left lower extremity venous Doppler ultrasound 02/08/2020. FINDINGS: VENOUS Normal compressibility of the common femoral, superficial femoral, and popliteal veins, as well as the visualized calf veins. Visualized portions of profunda femoral vein and great saphenous vein unremarkable. No filling defects to suggest DVT on grayscale or color Doppler imaging. Doppler waveforms show normal direction of venous flow, normal respiratory plasticity and response to augmentation. Limited views of the contralateral common femoral vein again demonstrate the previously diagnosis nonocclusive thrombus. OTHER None. Limitations: none IMPRESSION: Normal right lower extremity venous Doppler ultrasound. Electronically Signed   By: San Morelle M.D.   On: 02/08/2020 06:34   DG Chest Port 1 View  Result Date: 02/13/2020 CLINICAL DATA:  Blood clot in left leg.  History of COPD. EXAM: PORTABLE CHEST 1 VIEW COMPARISON:  02/12/2020 FINDINGS: There is bilateral diffuse mild interstitial thickening. There is a trace right pleural effusion. There is a small left pleural effusion. There is no pneumothorax. There is stable cardiomegaly. There is evidence of prior CABG. There is no acute osseous abnormality. IMPRESSION: Cardiomegaly with pulmonary vascular congestion. Trace right and small left pleural effusions. Electronically Signed   By: Kathreen Devoid   On: 02/13/2020 10:03   DG Chest Port 1 View  Result Date: 02/12/2020 CLINICAL DATA:  Hypoxia, acute renal failure, COPD, diabetes mellitus, hypertension, DVT, coronary artery disease post CABG EXAM: PORTABLE CHEST 1 VIEW COMPARISON:  Portable exam 1003 hours compared to 02/08/2020 FINDINGS: Minimal enlargement of cardiac silhouette post CABG. Mediastinal  contours and pulmonary vascularity normal. LEFT pleural effusion and basilar atelectasis. Questionable tiny RIGHT pleural effusion. No pneumothorax or segmental consolidation. Osseous structures demineralized. IMPRESSION: Minimal enlargement of cardiac silhouette post CABG. LEFT pleural effusion and basilar atelectasis. Electronically Signed   By: Lavonia Dana M.D.   On: 02/12/2020 10:28   DG Chest Portable 1 View  Result Date: 02/08/2020 CLINICAL DATA:  Orthopnea. Left leg swelling and cramping for 2 days. Left lower extremity DVT EXAM: PORTABLE CHEST 1 VIEW COMPARISON:  One-view chest x-ray 05/25/2019 FINDINGS: Heart is  enlarged. A new left pleural effusion is present. Associated left lower lobe airspace disease is present. Mild pulmonary vascular congestion is noted. No focal airspace disease is present on the right. IMPRESSION: New left pleural effusion with associated airspace disease. This may represent atelectasis or infection. Pulmonary embolus is also considered. Recommend CTA chest for further evaluation. Electronically Signed   By: San Morelle M.D.   On: 02/08/2020 07:23   ECHOCARDIOGRAM COMPLETE  Result Date: 02/08/2020    ECHOCARDIOGRAM REPORT   Patient Name:   Rick Zuniga Date of Exam: 02/08/2020 Medical Rec #:  [id number]           Height:       73.0 in Accession #:    [id number]          Weight:       180.0 lb Date of Birth:  1945/10/23           BSA:          2.057 m Patient Age:    74 years            BP:           139/74 mmHg Patient Gender: M                   HR:           80 bpm. Exam Location:  [facility] Procedure: 2D Echo, Color Doppler and Cardiac Doppler Indications:     R06.00 Dyspnea  History:         Patient has prior history of Echocardiogram examinations. COPD;                  Risk Factors:Hypertension and Diabetes.  Sonographer:     Charmayne Sheer RDCS (AE) Referring Phys:  [id number] Arvella Merles MANSY Diagnosing Phys: Yolonda Kida MD IMPRESSIONS  1. Left ventricular  ejection fraction, by estimation, is 40 to 45%. The left ventricle has mildly decreased function. The left ventricle demonstrates global hypokinesis. The left ventricular internal cavity size was mildly to moderately dilated. Left ventricular diastolic parameters are consistent with Grade II diastolic dysfunction (pseudonormalization).  2. Right ventricular systolic function is normal. The right ventricular size is normal.  3. Left atrial size was mildly dilated.  4. Right atrial size was mildly dilated.  5. The mitral valve is grossly normal. No evidence of mitral valve regurgitation.  6. The aortic valve is grossly normal. Aortic valve regurgitation is not visualized. FINDINGS  Left Ventricle: Left ventricular ejection fraction, by estimation, is 40 to 45%. The left ventricle has mildly decreased function. The left ventricle demonstrates global hypokinesis. The left ventricular internal cavity size was mildly to moderately dilated. There is no left ventricular hypertrophy. Left ventricular diastolic parameters are consistent with Grade II diastolic dysfunction (pseudonormalization). Right Ventricle: The right ventricular size is normal. No increase in right ventricular wall thickness. Right ventricular systolic function is normal. Left Atrium: Left atrial size was mildly dilated. Right Atrium: Right atrial size was mildly dilated. Pericardium: There is no evidence of pericardial effusion. Mitral Valve: The mitral valve is grossly normal. No evidence of mitral valve regurgitation. MV peak gradient, 4.2 mmHg. The mean mitral valve gradient is 2.0 mmHg. Tricuspid Valve: The tricuspid valve is normal in structure. Tricuspid valve regurgitation is trivial. Aortic Valve: The aortic valve is grossly normal. Aortic valve regurgitation is not visualized. Aortic valve mean gradient measures 4.0 mmHg. Aortic valve peak gradient measures 9.7 mmHg. Aortic valve area, by  VTI measures 2.83 cm. Pulmonic Valve: The pulmonic valve  was normal in structure. Pulmonic valve regurgitation is not visualized. Aorta: The aortic root is normal in size and structure. IAS/Shunts: No atrial level shunt detected by color flow Doppler.  LEFT VENTRICLE PLAX 2D LVIDd:         5.54 cm  Diastology LVIDs:         4.39 cm  LV e' lateral:   8.05 cm/s LV PW:         0.86 cm  LV E/e' lateral: 10.7 LV IVS:        0.63 cm  LV e' medial:    4.79 cm/s LVOT diam:     2.20 cm  LV E/e' medial:  18.0 LV SV:         77 LV SV Index:   38 LVOT Area:     3.80 cm  RIGHT VENTRICLE RV Basal diam:  3.99 cm LEFT ATRIUM             Index       RIGHT ATRIUM           Index LA diam:        4.20 cm 2.04 cm/m  RA Area:     12.70 cm LA Vol (A2C):   53.9 ml 26.20 ml/m RA Volume:   25.30 ml  12.30 ml/m LA Vol (A4C):   30.2 ml 14.68 ml/m LA Biplane Vol: 42.3 ml 20.56 ml/m  AORTIC VALVE                   PULMONIC VALVE AV Area (Vmax):    2.29 cm    PV Vmax:       1.10 m/s AV Area (Vmean):   2.45 cm    PV Vmean:      79.700 cm/s AV Area (VTI):     2.83 cm    PV VTI:        0.238 m AV Vmax:           156.00 cm/s PV Peak grad:  4.8 mmHg AV Vmean:          94.900 cm/s PV Mean grad:  3.0 mmHg AV VTI:            0.273 m AV Peak Grad:      9.7 mmHg AV Mean Grad:      4.0 mmHg LVOT Vmax:         93.80 cm/s LVOT Vmean:        61.200 cm/s LVOT VTI:          0.203 m LVOT/AV VTI ratio: 0.74  AORTA Ao Root diam: 3.40 cm MITRAL VALVE MV Area (PHT): 3.27 cm    SHUNTS MV Peak grad:  4.2 mmHg    Systemic VTI:  0.20 m MV Mean grad:  2.0 mmHg    Systemic Diam: 2.20 cm MV Vmax:       1.02 m/s MV Vmean:      72.8 cm/s MV Decel Time: 232 msec MV E velocity: 86.30 cm/s MV A velocity: 58.20 cm/s MV E/A ratio:  1.48 Dwayne D Callwood MD Electronically signed by Yolonda Kida MD Signature Date/Time: 02/08/2020/4:28:10 PM    Final    Intravitreal Injection, Pharmacologic Agent - OD - Right Eye  Result Date: 02/07/2020 Time Out 02/06/2020. 5:01 PM. Confirmed correct patient, procedure, site, and  patient consented. Anesthesia Topical anesthesia was used. Anesthetic medications included Lidocaine 2%, Proparacaine 0.5%. Procedure Preparation included  5% betadine to ocular surface, eyelid speculum. A supplied needle was used. Injection: 1.25 mg Bevacizumab (AVASTIN) SOLN   NDC: 14970-263-78, Lot: [id number] , Expiration date: 04/11/2020   Route: Intravitreal, Site: Right Eye, Waste: 0 mL Post-op Post injection exam found visual acuity of at least counting fingers. The patient tolerated the procedure well. There were no complications. The patient received written and verbal post procedure care education.   Intravitreal Injection, Pharmacologic Agent - OS - Left Eye  Result Date: 02/07/2020 Time Out 02/06/2020. 5:02 PM. Confirmed correct patient, procedure, site, and patient consented. Anesthesia Topical anesthesia was used. Anesthetic medications included Lidocaine 2%, Proparacaine 0.5%. Procedure Preparation included 5% betadine to ocular surface, eyelid speculum. A supplied needle was used. Injection: 1.25 mg Bevacizumab (AVASTIN) SOLN   NDC: 02/19/2020, Lot: [id number] , Expiration date: 03/28/2020   Route: Intravitreal, Site: Left Eye, Waste: 0 mL Post-op Post injection exam found visual acuity of at least counting fingers. The patient tolerated the procedure well. There were no complications. The patient received written and verbal post procedure care education.   OCT, Retina - OU - Both Eyes  Result Date: 02/07/2020 Right Eye Quality was good. Central Foveal Thickness: 573. Progression has no prior data. Findings include abnormal foveal contour, intraretinal fluid, subretinal fluid. Left Eye Quality was good. Central Foveal Thickness: 506. Progression has no prior data. Findings include abnormal foveal contour, intraretinal fluid, subretinal fluid. Notes *Images captured and stored on drive Diagnosis / Impression: +DME OU Clinical management: See below Abbreviations: NFP - Normal foveal profile. CME  - cystoid macular edema. PED - pigment epithelial detachment. IRF - intraretinal fluid. SRF - subretinal fluid. EZ - ellipsoid zone. ERM - epiretinal membrane. ORA - outer retinal atrophy. ORT - outer retinal tubulation. SRHM - subretinal hyper-reflective material  Fluorescein Angiography Optos (Transit OS)  Result Date: 02/07/2020 Right Eye Progression has no prior data. Early phase findings include microaneurysm, vascular perfusion defect. Mid/Late phase findings include microaneurysm, vascular perfusion defect, leakage (No NV). Left Eye Progression has no prior data. Early phase findings include delayed filling, blockage, microaneurysm, vascular perfusion defect (Delayed venous return). Mid/Late phase findings include blockage, microaneurysm, vascular perfusion defect, leakage (No NV). 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  Reports feeling well.  Had BM.  No fevers or chills, chest pain, SOB, N/V or other acute complaints.  Wants to go home.   Discharge Exam: Vitals:   02/15/20 0417 02/15/20 0810  BP: 125/69 124/62  Pulse: 87 79  Resp: 16 18  Temp: 98.2 F (36.8 C) 98 F (36.7 C)  SpO2: 96% 96%   Vitals:   02/14/20 1931 02/14/20 2330 02/15/20 0417 02/15/20 0810  BP: 110/61 123/66 125/69 124/62  Pulse: 69 80 87 79  Resp: 17 17 16 18   Temp: 98.1 F (36.7 C) 98.2 F (36.8 C) 98.2 F (36.8 C) 98 F (36.7 C)  TempSrc:   Oral Oral  SpO2: 98% 91% 96% 96%  Weight:      Height:        General: Pt is alert, awake, not in acute distress Cardiovascular: RRR, S1/S2 +, no rubs, no gallops Respiratory: diminished bases otherwise CTAB, no wheezing, no rhonchi Abdominal: Soft, NT, ND, bowel sounds + Extremities: no edema, no cyanosis    The results of significant diagnostics from this hospitalization (including imaging, microbiology, ancillary and laboratory) are  listed  below for reference.     Microbiology: Recent Results (from the past 240 hour(s))  SARS Coronavirus 2 by RT PCR (hospital order, performed in [facility] hospital lab) Nasopharyngeal Nasopharyngeal Swab     Status: None   Collection Time: 02/08/20  5:26 AM   Specimen: Nasopharyngeal Swab  Result Value Ref Range Status   SARS Coronavirus 2 NEGATIVE NEGATIVE Final    Comment: (NOTE) SARS-CoV-2 target nucleic acids are NOT DETECTED.  The SARS-CoV-2 RNA is generally detectable in upper and lower respiratory specimens during the acute phase of infection. The lowest concentration of SARS-CoV-2 viral copies this assay can detect is 250 copies / mL. A negative result does not preclude SARS-CoV-2 infection and should not be used as the sole basis for treatment or other patient management decisions.  A negative result may occur with improper specimen collection / handling, submission of specimen other than nasopharyngeal swab, presence of viral mutation(s) within the areas targeted by this assay, and inadequate number of viral copies (<250 copies / mL). A negative result must be combined with clinical observations, patient history, and epidemiological information.  Fact Sheet for Patients:   StrictlyIdeas.no  Fact Sheet for Healthcare Providers: BankingDealers.co.za  This test is not yet approved or  cleared by the [place] FDA and has been authorized for detection and/or diagnosis of SARS-CoV-2 by FDA under an Emergency Use Authorization (EUA).  This EUA will remain in effect (meaning this test can be used) for the duration of the COVID-19 declaration under Section 564(b)(1) of the Act, 21 U.S.C. section 360bbb-3(b)(1), unless the authorization is terminated or revoked sooner.  Performed at [facility], [address] [place], [place] [address]   Culture, blood (Routine X 2) w Reflex to ID Panel     Status: None    Collection Time: 02/10/20 10:24 AM   Specimen: BLOOD  Result Value Ref Range Status   Specimen Description BLOOD LEFT ANTECUBITAL  Final   Special Requests   Final    BOTTLES DRAWN AEROBIC AND ANAEROBIC Blood Culture adequate volume   Culture   Final    NO GROWTH 5 DAYS Performed at [facility], [address] [place], [place] [address]    Report Status 02/15/2020 FINAL  Final  Culture, blood (Routine X 2) w Reflex to ID Panel     Status: None   Collection Time: 02/10/20 10:30 AM   Specimen: BLOOD  Result Value Ref Range Status   Specimen Description BLOOD BLOOD LEFT HAND  Final   Special Requests   Final    BOTTLES DRAWN AEROBIC AND ANAEROBIC Blood Culture results may not be optimal due to an excessive volume of blood received in culture bottles   Culture   Final    NO GROWTH 5 DAYS Performed at [facility], [address], [place], [place] [address]    Report Status 02/15/2020 FINAL  Final  Urine Culture     Status: Abnormal   Collection Time: 02/11/20  4:08 PM   Specimen: Urine, Clean Catch  Result Value Ref Range Status   Specimen Description   Final    URINE, CLEAN CATCH Performed at [facility], [address] [place], [place] [address]    Special Requests   Final    NONE Performed at [facility], [address] [place], [place] [address]    Culture (A)  Final    >=100,000 COLONIES/mL KLEBSIELLA PNEUMONIAE >=100,000 COLONIES/mL ENTEROCOCCUS FAECALIS    Report Status  02/14/2020 FINAL  Final   Organism ID, Bacteria KLEBSIELLA PNEUMONIAE (A)  Final   Organism ID, Bacteria ENTEROCOCCUS FAECALIS (A)  Final      Susceptibility   Enterococcus faecalis - MIC*    AMPICILLIN <=2 SENSITIVE Sensitive     NITROFURANTOIN <=16 SENSITIVE Sensitive     VANCOMYCIN 1 SENSITIVE Sensitive     * >=100,000 COLONIES/mL ENTEROCOCCUS FAECALIS   Klebsiella pneumoniae - MIC*    AMPICILLIN >=32 RESISTANT Resistant     CEFAZOLIN <=4 SENSITIVE  Sensitive     CEFTRIAXONE <=0.25 SENSITIVE Sensitive     CIPROFLOXACIN <=0.25 SENSITIVE Sensitive     GENTAMICIN <=1 SENSITIVE Sensitive     IMIPENEM <=0.25 SENSITIVE Sensitive     NITROFURANTOIN 64 INTERMEDIATE Intermediate     TRIMETH/SULFA <=20 SENSITIVE Sensitive     AMPICILLIN/SULBACTAM 4 SENSITIVE Sensitive     PIP/TAZO <=4 SENSITIVE Sensitive     * >=100,000 COLONIES/mL KLEBSIELLA PNEUMONIAE     Labs: BNP (last 3 results) Recent Labs    02/13/20 0514  BNP 3,474.2*   Basic Metabolic Panel: Recent Labs  Lab 02/11/20 0945 02/12/20 0445 02/12/20 0752 02/13/20 0514 02/14/20 0441 02/15/20 0505  NA 135 137  --  134* 134* 135  K 3.6 3.7  --  4.0 3.8 3.6  CL 98 99  --  95* 96* 95*  CO2 28 29  --  24 26 26   GLUCOSE 298* 147*  --  196* 202* 159*  BUN 48* 51*  --  64* 72* 74*  CREATININE 2.37* 2.56*  --  2.87* 2.92* 2.72*  CALCIUM 8.3* 8.3*  --  8.2* 8.2* 8.4*  MG 2.1 2.2  --  2.2 2.3 2.4  PHOS  --   --  4.0  --   --   --    Liver Function Tests: Recent Labs  Lab 02/13/20 0514  ALBUMIN 2.6*   No results for input(s): LIPASE, AMYLASE in the last 168 hours. No results for input(s): AMMONIA in the last 168 hours. CBC: Recent Labs  Lab 02/11/20 0945 02/12/20 0445 02/13/20 0514 02/14/20 0441 02/15/20 0505  WBC 7.8 6.6 6.5 5.5 5.7  HGB 9.0* 8.6* 8.6* 8.3* 8.6*  HCT 24.9* 25.0* 24.7* 22.4* 23.4*  MCV 89.6 92.6 91.8 87.8 87.6  PLT 97* 102* 111* 123* 134*   Cardiac Enzymes: No results for input(s): CKTOTAL, CKMB, CKMBINDEX, TROPONINI in the last 168 hours. BNP: Invalid input(s): POCBNP CBG: Recent Labs  Lab 02/14/20 0755 02/14/20 1205 02/14/20 1621 02/14/20 2056 02/15/20 0812  GLUCAP 194* 334* 146* 118* 169*   D-Dimer No results for input(s): DDIMER in the last 72 hours. Hgb A1c No results for input(s): HGBA1C in the last 72 hours. Lipid Profile No results for input(s): CHOL, HDL, LDLCALC, TRIG, CHOLHDL, LDLDIRECT in the last 72 hours. Thyroid  function studies No results for input(s): TSH, T4TOTAL, T3FREE, THYROIDAB in the last 72 hours.  Invalid input(s): FREET3 Anemia work up No results for input(s): VITAMINB12, FOLATE, FERRITIN, TIBC, IRON, RETICCTPCT in the last 72 hours. Urinalysis    Component Value Date/Time   COLORURINE YELLOW (A) 02/11/2020 1608   APPEARANCEUR HAZY (A) 02/11/2020 1608   LABSPEC 1.013 02/11/2020 1608   PHURINE 5.0 02/11/2020 1608   GLUCOSEU NEGATIVE 02/11/2020 1608   HGBUR MODERATE (A) 02/11/2020 1608   BILIRUBINUR NEGATIVE 02/11/2020 [place] 02/11/2020 1608   PROTEINUR 30 (A) 02/11/2020 1608   NITRITE NEGATIVE 02/11/2020 [place] 02/11/2020 1608  Sepsis Labs Invalid input(s): PROCALCITONIN,  WBC,  LACTICIDVEN Microbiology Recent Results (from the past 240 hour(s))  SARS Coronavirus 2 by RT PCR (hospital order, performed in [facility] hospital lab) Nasopharyngeal Nasopharyngeal Swab     Status: None   Collection Time: 02/08/20  5:26 AM   Specimen: Nasopharyngeal Swab  Result Value Ref Range Status   SARS Coronavirus 2 NEGATIVE NEGATIVE Final    Comment: (NOTE) SARS-CoV-2 target nucleic acids are NOT DETECTED.  The SARS-CoV-2 RNA is generally detectable in upper and lower respiratory specimens during the acute phase of infection. The lowest concentration of SARS-CoV-2 viral copies this assay can detect is 250 copies / mL. A negative result does not preclude SARS-CoV-2 infection and should not be used as the sole basis for treatment or other patient management decisions.  A negative result may occur with improper specimen collection / handling, submission of specimen other than nasopharyngeal swab, presence of viral mutation(s) within the areas targeted by this assay, and inadequate number of viral copies (<250 copies / mL). A negative result must be combined with clinical observations, patient history, and epidemiological information.  Fact Sheet  for Patients:   StrictlyIdeas.no  Fact Sheet for Healthcare Providers: BankingDealers.co.za  This test is not yet approved or  cleared by the [place] FDA and has been authorized for detection and/or diagnosis of SARS-CoV-2 by FDA under an Emergency Use Authorization (EUA).  This EUA will remain in effect (meaning this test can be used) for the duration of the COVID-19 declaration under Section 564(b)(1) of the Act, 21 U.S.C. section 360bbb-3(b)(1), unless the authorization is terminated or revoked sooner.  Performed at [facility], [address] [place], [place] [address]   Culture, blood (Routine X 2) w Reflex to ID Panel     Status: None   Collection Time: 02/10/20 10:24 AM   Specimen: BLOOD  Result Value Ref Range Status   Specimen Description BLOOD LEFT ANTECUBITAL  Final   Special Requests   Final    BOTTLES DRAWN AEROBIC AND ANAEROBIC Blood Culture adequate volume   Culture   Final    NO GROWTH 5 DAYS Performed at [facility], [address] [place], [place] [address]    Report Status 02/15/2020 FINAL  Final  Culture, blood (Routine X 2) w Reflex to ID Panel     Status: None   Collection Time: 02/10/20 10:30 AM   Specimen: BLOOD  Result Value Ref Range Status   Specimen Description BLOOD BLOOD LEFT HAND  Final   Special Requests   Final    BOTTLES DRAWN AEROBIC AND ANAEROBIC Blood Culture results may not be optimal due to an excessive volume of blood received in culture bottles   Culture   Final    NO GROWTH 5 DAYS Performed at [facility], [address], [place], [place] [address]    Report Status 02/15/2020 FINAL  Final  Urine Culture     Status: Abnormal   Collection Time: 02/11/20  4:08 PM   Specimen: Urine, Clean Catch  Result Value Ref Range Status   Specimen Description   Final    URINE, CLEAN CATCH Performed at [facility], [address] [place],  [place] [address]    Special Requests   Final    NONE Performed at [facility], [address] [place], [place] [address]    Culture (A)  Final    >=100,000 COLONIES/mL KLEBSIELLA PNEUMONIAE >=100,000 COLONIES/mL ENTEROCOCCUS FAECALIS  Report Status 02/14/2020 FINAL  Final   Organism ID, Bacteria KLEBSIELLA PNEUMONIAE (A)  Final   Organism ID, Bacteria ENTEROCOCCUS FAECALIS (A)  Final      Susceptibility   Enterococcus faecalis - MIC*    AMPICILLIN <=2 SENSITIVE Sensitive     NITROFURANTOIN <=16 SENSITIVE Sensitive     VANCOMYCIN 1 SENSITIVE Sensitive     * >=100,000 COLONIES/mL ENTEROCOCCUS FAECALIS   Klebsiella pneumoniae - MIC*    AMPICILLIN >=32 RESISTANT Resistant     CEFAZOLIN <=4 SENSITIVE Sensitive     CEFTRIAXONE <=0.25 SENSITIVE Sensitive     CIPROFLOXACIN <=0.25 SENSITIVE Sensitive     GENTAMICIN <=1 SENSITIVE Sensitive     IMIPENEM <=0.25 SENSITIVE Sensitive     NITROFURANTOIN 64 INTERMEDIATE Intermediate     TRIMETH/SULFA <=20 SENSITIVE Sensitive     AMPICILLIN/SULBACTAM 4 SENSITIVE Sensitive     PIP/TAZO <=4 SENSITIVE Sensitive     * >=100,000 COLONIES/mL KLEBSIELLA PNEUMONIAE     Time coordinating discharge: Over 30 minutes  SIGNED:   Ezekiel Slocumb, DO [facility] 02/15/2020, 11:57 AM   If 7PM-7AM, please contact night-coverage [email or web address]

## 2020-02-15 NOTE — Progress Notes (Signed)
[facility]  ROUNDING NOTE   Subjective:   UOP 1750.  Patient states he is feeling better.   Objective:  Vital signs in last 24 hours:  Temp:  [98 F (36.7 C)-98.2 F (36.8 C)] 98 F (36.7 C) (06/30 0810) Pulse Rate:  [69-87] 79 (06/30 0810) Resp:  [16-18] 18 (06/30 0810) BP: (110-125)/(61-73) 124/62 (06/30 0810) SpO2:  [91 %-98 %] 96 % (06/30 0810)  Weight change:  Filed Weights   02/08/20 0212  Weight: 81.6 kg    Intake/Output: I/O last 3 completed shifts: In: 1521.5 [P.O.:1230; IV Piggyback:291.5] Out: 2175 [Urine:2175]   Intake/Output this shift:  Total I/O In: 480 [P.O.:480] Out: 300 [Urine:300]  Physical Exam: General: NAD,   Head: Normocephalic, atraumatic. Moist oral mucosal membranes  Eyes: Anicteric, PERRL  Neck: Supple, trachea midline  Lungs:  Left base diminished  Heart: Regular rate and rhythm  Abdomen:  Soft, nontender,   Extremities:  trace peripheral edema.  Neurologic: Nonfocal, moving all four extremities  Skin: No lesions        Basic Metabolic Panel: Recent Labs  Lab 02/11/20 0945 02/11/20 0945 02/12/20 0445 02/12/20 0445 02/12/20 0752 02/13/20 0514 02/14/20 0441 02/15/20 0505  NA 135  --  137  --   --  134* 134* 135  K 3.6  --  3.7  --   --  4.0 3.8 3.6  CL 98  --  99  --   --  95* 96* 95*  CO2 28  --  29  --   --  24 26 26   GLUCOSE 298*  --  147*  --   --  196* 202* 159*  BUN 48*  --  51*  --   --  64* 72* 74*  CREATININE 2.37*  --  2.56*  --   --  2.87* 2.92* 2.72*  CALCIUM 8.3*   < > 8.3*   < >  --  8.2* 8.2* 8.4*  MG 2.1  --  2.2  --   --  2.2 2.3 2.4  PHOS  --   --   --   --  4.0  --   --   --    < > = values in this interval not displayed.    Liver Function Tests: Recent Labs  Lab 02/13/20 0514  ALBUMIN 2.6*   No results for input(s): LIPASE, AMYLASE in the last 168 hours. No results for input(s): AMMONIA in the last 168 hours.  CBC: Recent Labs  Lab 02/11/20 0945 02/12/20 0445  02/13/20 0514 02/14/20 0441 02/15/20 0505  WBC 7.8 6.6 6.5 5.5 5.7  HGB 9.0* 8.6* 8.6* 8.3* 8.6*  HCT 24.9* 25.0* 24.7* 22.4* 23.4*  MCV 89.6 92.6 91.8 87.8 87.6  PLT 97* 102* 111* 123* 134*    Cardiac Enzymes: No results for input(s): CKTOTAL, CKMB, CKMBINDEX, TROPONINI in the last 168 hours.  BNP: Invalid input(s): POCBNP  CBG: Recent Labs  Lab 02/14/20 1205 02/14/20 1621 02/14/20 2056 02/15/20 0812 02/15/20 1158  GLUCAP 334* 146* 118* 169* 167*    Microbiology: Results for orders placed or performed during the hospital encounter of 02/08/20  SARS Coronavirus 2 by RT PCR (hospital order, performed in [facility] hospital lab) Nasopharyngeal Nasopharyngeal Swab     Status: None   Collection Time: 02/08/20  5:26 AM   Specimen: Nasopharyngeal Swab  Result Value Ref Range Status   SARS Coronavirus 2 NEGATIVE NEGATIVE Final    Comment: (NOTE) SARS-CoV-2 target nucleic acids are  NOT DETECTED.  The SARS-CoV-2 RNA is generally detectable in upper and lower respiratory specimens during the acute phase of infection. The lowest concentration of SARS-CoV-2 viral copies this assay can detect is 250 copies / mL. A negative result does not preclude SARS-CoV-2 infection and should not be used as the sole basis for treatment or other patient management decisions.  A negative result may occur with improper specimen collection / handling, submission of specimen other than nasopharyngeal swab, presence of viral mutation(s) within the areas targeted by this assay, and inadequate number of viral copies (<250 copies / mL). A negative result must be combined with clinical observations, patient history, and epidemiological information.  Fact Sheet for Patients:   StrictlyIdeas.no  Fact Sheet for Healthcare Providers: BankingDealers.co.za  This test is not yet approved or  cleared by the [place] FDA and has been authorized for  detection and/or diagnosis of SARS-CoV-2 by FDA under an Emergency Use Authorization (EUA).  This EUA will remain in effect (meaning this test can be used) for the duration of the COVID-19 declaration under Section 564(b)(1) of the Act, 21 U.S.C. section 360bbb-3(b)(1), unless the authorization is terminated or revoked sooner.  Performed at [facility], [address] [place], [place] [address]   Culture, blood (Routine X 2) w Reflex to ID Panel     Status: None   Collection Time: 02/10/20 10:24 AM   Specimen: BLOOD  Result Value Ref Range Status   Specimen Description BLOOD LEFT ANTECUBITAL  Final   Special Requests   Final    BOTTLES DRAWN AEROBIC AND ANAEROBIC Blood Culture adequate volume   Culture   Final    NO GROWTH 5 DAYS Performed at [facility], [address] [place], [place] [address]    Report Status 02/15/2020 FINAL  Final  Culture, blood (Routine X 2) w Reflex to ID Panel     Status: None   Collection Time: 02/10/20 10:30 AM   Specimen: BLOOD  Result Value Ref Range Status   Specimen Description BLOOD BLOOD LEFT HAND  Final   Special Requests   Final    BOTTLES DRAWN AEROBIC AND ANAEROBIC Blood Culture results may not be optimal due to an excessive volume of blood received in culture bottles   Culture   Final    NO GROWTH 5 DAYS Performed at [facility], [address] [place], [place] [address]    Report Status 02/15/2020 FINAL  Final  Urine Culture     Status: Abnormal   Collection Time: 02/11/20  4:08 PM   Specimen: Urine, Clean Catch  Result Value Ref Range Status   Specimen Description   Final    URINE, CLEAN CATCH Performed at [facility], [address], [place], [place] [address]    Special Requests   Final    NONE Performed at [facility], [address] [place], [place] [address]    Culture (A)  Final    >=100,000 COLONIES/mL KLEBSIELLA PNEUMONIAE >=100,000 COLONIES/mL ENTEROCOCCUS  FAECALIS    Report Status 02/14/2020 FINAL  Final   Organism ID, Bacteria KLEBSIELLA PNEUMONIAE (A)  Final   Organism ID, Bacteria ENTEROCOCCUS FAECALIS (A)  Final      Susceptibility   Enterococcus faecalis - MIC*    AMPICILLIN <=2 SENSITIVE Sensitive     NITROFURANTOIN <=16 SENSITIVE Sensitive     VANCOMYCIN 1 SENSITIVE Sensitive     * >=100,000 COLONIES/mL ENTEROCOCCUS FAECALIS   Klebsiella pneumoniae - MIC*  AMPICILLIN >=32 RESISTANT Resistant     CEFAZOLIN <=4 SENSITIVE Sensitive     CEFTRIAXONE <=0.25 SENSITIVE Sensitive     CIPROFLOXACIN <=0.25 SENSITIVE Sensitive     GENTAMICIN <=1 SENSITIVE Sensitive     IMIPENEM <=0.25 SENSITIVE Sensitive     NITROFURANTOIN 64 INTERMEDIATE Intermediate     TRIMETH/SULFA <=20 SENSITIVE Sensitive     AMPICILLIN/SULBACTAM 4 SENSITIVE Sensitive     PIP/TAZO <=4 SENSITIVE Sensitive     * >=100,000 COLONIES/mL KLEBSIELLA PNEUMONIAE    Coagulation Studies: No results for input(s): LABPROT, INR in the last 72 hours.  Urinalysis: No results for input(s): COLORURINE, LABSPEC, PHURINE, GLUCOSEU, HGBUR, BILIRUBINUR, KETONESUR, PROTEINUR, UROBILINOGEN, NITRITE, LEUKOCYTESUR in the last 72 hours.  Invalid input(s): APPERANCEUR    Imaging: DG Chest 2 View  Result Date: 02/13/2020 CLINICAL DATA:  Congestive heart failure. EXAM: CHEST - 2 VIEW COMPARISON:  February 13, 2020 FINDINGS: The heart size remains enlarged. The patient is status post prior median sternotomy. There is a moderate-sized left-sided pleural effusion with adjacent atelectasis. There is no pneumothorax. There may be a small right-sided pleural effusion. There is vascular congestion. IMPRESSION: No significant interval change. Persistent cardiomegaly with small to moderate-sized bilateral pleural effusions, left greater than right. Electronically Signed   By: Constance Holster M.D.   On: 02/13/2020 15:33     Medications:   . sodium chloride Stopped (02/12/20 1802)   . amLODipine   10 mg Oral Daily  . amoxicillin-clavulanate  1 tablet Oral BID  . apixaban  10 mg Oral BID   Followed by  . [START ON 02/16/2020] apixaban  5 mg Oral BID  . aspirin EC  81 mg Oral Daily  . carvedilol  6.25 mg Oral BID WC  . feeding supplement (ENSURE ENLIVE)  237 mL Oral BID BM  . ferrous sulfate  325 mg Oral BID WC  . insulin aspart  0-9 Units Subcutaneous TID WC  . insulin aspart protamine- aspart  5 Units Subcutaneous BID WC  . isosorbide mononitrate  30 mg Oral Daily  . mouth rinse  15 mL Mouth Rinse BID  . polyethylene glycol  17 g Oral BID   sodium chloride, acetaminophen, magnesium hydroxide, nitroGLYCERIN, ondansetron **OR** ondansetron (ZOFRAN) IV, oxyCODONE, traZODone  Assessment/ Plan:  [name] is a 74 y.o. white male with COPD, insulin dependent diabetes mellitus type 2, coronary artery disease, hypertension, thrombocytopenia, who was admitted to [facility] on 02/08/2020 for AKI (acute kidney injury) ([place]) [N17.9] Acute deep vein thrombosis (DVT) of distal vein of left lower extremity ([place]) [I82.4Z2] Acute renal failure, unspecified acute renal failure type ([place]) [N17.9]  1. Acute kidney injury on chronic kidney disease stage IIIB with proteinuria: Baseline creatinine 1.8, GFR of 37.  Chronic kidney disease secondary to diabetic nephropathy.  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 No IV contrast exposure.  Holding diuretics.  May resume torsemide on discharge.   2. 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 Echo EF 45-50% with grade II diastolic dysfunction on 1/96.  Torsemide as above.   3. Diabetes mellitus type II with chronic kidney disease: insulin dependent. History of poor control. Hemoglobin A1c of 10.9 on 2/4.   4. Anemia of chronic kidney disease:  normocytic. Consistent with iron deficiency.   5. DVT: unknown chronicity.  - continue apixaban. Continue to  monitor thrombocytopenia.     LOS: 7 Tracie Dore 6/30/20211:56 PM

## 2020-02-15 NOTE — Plan of Care (Signed)
  Problem: Education: Goal: Knowledge of General Education information will improve Description: Including pain rating scale, medication(s)/side effects and non-pharmacologic comfort measures Outcome: Progressing   Problem: Education: Goal: Knowledge of General Education information will improve Description: Including pain rating scale, medication(s)/side effects and non-pharmacologic comfort measures Outcome: Progressing   Problem: Health Behavior/Discharge Planning: Goal: Ability to manage health-related needs will improve Outcome: Progressing   Problem: Clinical Measurements: Goal: Ability to maintain clinical measurements within normal limits will improve Outcome: Progressing Goal: Will remain free from infection Outcome: Progressing Goal: Diagnostic test results will improve Outcome: Progressing Goal: Respiratory complications will improve Outcome: Progressing Goal: Cardiovascular complication will be avoided Outcome: Progressing   Problem: Clinical Measurements: Goal: Will remain free from infection Outcome: Progressing   Problem: Clinical Measurements: Goal: Diagnostic test results will improve Outcome: Progressing   Problem: Clinical Measurements: Goal: Respiratory complications will improve Outcome: Progressing   Problem: Clinical Measurements: Goal: Cardiovascular complication will be avoided Outcome: Progressing   Problem: Clinical Measurements: Goal: Cardiovascular complication will be avoided Outcome: Progressing

## 2020-02-15 NOTE — Hospital Course (Signed)
Rick Zuniga  is a 74 y.o. Caucasian male with a known history of type 2 diabetes mellitus, hypertension, coronary artery disease, COPD and osteoarthritis, presented to the ED on 02/08/2020 with worsening bilateral lower extremity pain and swelling over the past 5 days.  Prior to that the patient had an eye procedure that required IV contrast.  He had no recent long travels or other surgeries.  He stated that he stayed in a recliner all day yesterday.  He usually has good pain tolerance and was shouting in pain per his wife.  He has chronic diarrhea but denied any nausea or vomiting or abdominal pain.  No dysuria, oliguria or hematuria or flank pain.  He has been having chills and recent cold intolerance.  He admitted to recent dyspnea on exertion with orthopnea and paroxysmal nocturnal dyspnea with no chest pain or palpitations.

## 2020-02-15 NOTE — Progress Notes (Signed)
Pt for discharge home. No resp distress. Sl d/cd. Instructions discussed with pt.  Meds/ diet / activity and f/u discussed. Verbalize understanding. Out via w/c at this time w/o c/o.

## 2020-02-28 ENCOUNTER — Inpatient Hospital Stay
Admission: EM | Admit: 2020-02-28 | Discharge: 2020-03-02 | DRG: 291 | Disposition: A | Discharge status: Home Health | Payer: Medicare Other | Attending: Internal Medicine | Admitting: Internal Medicine

## 2020-02-28 ENCOUNTER — Other Ambulatory Visit: Payer: Self-pay

## 2020-02-28 ENCOUNTER — Emergency Department: Payer: Medicare Other

## 2020-02-28 ENCOUNTER — Encounter: Payer: Self-pay | Admitting: Emergency Medicine

## 2020-02-28 DIAGNOSIS — Z8249 Family history of ischemic heart disease and other diseases of the circulatory system: Secondary | ICD-10-CM | POA: Diagnosis not present

## 2020-02-28 DIAGNOSIS — N179 Acute kidney failure, unspecified: Secondary | ICD-10-CM | POA: Diagnosis present

## 2020-02-28 DIAGNOSIS — D696 Thrombocytopenia, unspecified: Secondary | ICD-10-CM | POA: Diagnosis present

## 2020-02-28 DIAGNOSIS — I2510 Atherosclerotic heart disease of native coronary artery without angina pectoris: Secondary | ICD-10-CM | POA: Diagnosis present

## 2020-02-28 DIAGNOSIS — Z91048 Other nonmedicinal substance allergy status: Secondary | ICD-10-CM

## 2020-02-28 DIAGNOSIS — J449 Chronic obstructive pulmonary disease, unspecified: Secondary | ICD-10-CM | POA: Diagnosis present

## 2020-02-28 DIAGNOSIS — N189 Chronic kidney disease, unspecified: Secondary | ICD-10-CM | POA: Diagnosis not present

## 2020-02-28 DIAGNOSIS — Z9049 Acquired absence of other specified parts of digestive tract: Secondary | ICD-10-CM | POA: Diagnosis not present

## 2020-02-28 DIAGNOSIS — N1832 Chronic kidney disease, stage 3b: Secondary | ICD-10-CM | POA: Diagnosis present

## 2020-02-28 DIAGNOSIS — R112 Nausea with vomiting, unspecified: Secondary | ICD-10-CM | POA: Diagnosis present

## 2020-02-28 DIAGNOSIS — Z841 Family history of disorders of kidney and ureter: Secondary | ICD-10-CM

## 2020-02-28 DIAGNOSIS — I5023 Acute on chronic systolic (congestive) heart failure: Secondary | ICD-10-CM | POA: Diagnosis not present

## 2020-02-28 DIAGNOSIS — I5043 Acute on chronic combined systolic (congestive) and diastolic (congestive) heart failure: Secondary | ICD-10-CM

## 2020-02-28 DIAGNOSIS — Z951 Presence of aortocoronary bypass graft: Secondary | ICD-10-CM

## 2020-02-28 DIAGNOSIS — Z86718 Personal history of other venous thrombosis and embolism: Secondary | ICD-10-CM | POA: Diagnosis not present

## 2020-02-28 DIAGNOSIS — E875 Hyperkalemia: Secondary | ICD-10-CM | POA: Diagnosis present

## 2020-02-28 DIAGNOSIS — Z794 Long term (current) use of insulin: Secondary | ICD-10-CM

## 2020-02-28 DIAGNOSIS — R0602 Shortness of breath: Secondary | ICD-10-CM | POA: Diagnosis present

## 2020-02-28 DIAGNOSIS — I5033 Acute on chronic diastolic (congestive) heart failure: Secondary | ICD-10-CM | POA: Diagnosis not present

## 2020-02-28 DIAGNOSIS — Z7982 Long term (current) use of aspirin: Secondary | ICD-10-CM | POA: Diagnosis not present

## 2020-02-28 DIAGNOSIS — Z91040 Latex allergy status: Secondary | ICD-10-CM | POA: Diagnosis not present

## 2020-02-28 DIAGNOSIS — Z7901 Long term (current) use of anticoagulants: Secondary | ICD-10-CM | POA: Diagnosis not present

## 2020-02-28 DIAGNOSIS — Z888 Allergy status to other drugs, medicaments and biological substances status: Secondary | ICD-10-CM

## 2020-02-28 DIAGNOSIS — E876 Hypokalemia: Secondary | ICD-10-CM | POA: Diagnosis not present

## 2020-02-28 DIAGNOSIS — Z20822 Contact with and (suspected) exposure to covid-19: Secondary | ICD-10-CM | POA: Diagnosis present

## 2020-02-28 DIAGNOSIS — E1122 Type 2 diabetes mellitus with diabetic chronic kidney disease: Secondary | ICD-10-CM | POA: Diagnosis present

## 2020-02-28 DIAGNOSIS — I130 Hypertensive heart and chronic kidney disease with heart failure and stage 1 through stage 4 chronic kidney disease, or unspecified chronic kidney disease: Secondary | ICD-10-CM | POA: Diagnosis present

## 2020-02-28 DIAGNOSIS — R42 Dizziness and giddiness: Secondary | ICD-10-CM | POA: Diagnosis present

## 2020-02-28 DIAGNOSIS — M1990 Unspecified osteoarthritis, unspecified site: Secondary | ICD-10-CM | POA: Diagnosis present

## 2020-02-28 DIAGNOSIS — Z833 Family history of diabetes mellitus: Secondary | ICD-10-CM | POA: Diagnosis not present

## 2020-02-28 DIAGNOSIS — Z79899 Other long term (current) drug therapy: Secondary | ICD-10-CM

## 2020-02-28 DIAGNOSIS — J90 Pleural effusion, not elsewhere classified: Secondary | ICD-10-CM

## 2020-02-28 LAB — COMPREHENSIVE METABOLIC PANEL
ALT: 14 U/L (ref 0–44)
AST: 18 U/L (ref 15–41)
Albumin: 3.8 g/dL (ref 3.5–5.0)
Alkaline Phosphatase: 64 U/L (ref 38–126)
Anion gap: 12 (ref 5–15)
BUN: 59 mg/dL — ABNORMAL HIGH (ref 8–23)
CO2: 24 mmol/L (ref 22–32)
Calcium: 8.9 mg/dL (ref 8.9–10.3)
Chloride: 101 mmol/L (ref 98–111)
Creatinine, Ser: 2.5 mg/dL — ABNORMAL HIGH (ref 0.61–1.24)
GFR calc Af Amer: 28 mL/min — ABNORMAL LOW (ref >60)
GFR calc non Af Amer: 24 mL/min — ABNORMAL LOW (ref >60)
Glucose, Bld: 321 mg/dL — ABNORMAL HIGH (ref 70–99)
Potassium: 5.4 mmol/L — ABNORMAL HIGH (ref 3.5–5.1)
Sodium: 137 mmol/L (ref 135–145)
Total Bilirubin: 1.9 mg/dL — ABNORMAL HIGH (ref 0.3–1.2)
Total Protein: 7.5 g/dL (ref 6.5–8.1)

## 2020-02-28 LAB — TROPONIN I (HIGH SENSITIVITY)
Troponin I (High Sensitivity): 17 ng/L (ref <18)
Troponin I (High Sensitivity): 17 ng/L (ref <18)

## 2020-02-28 LAB — CBC
HCT: 26.7 % — ABNORMAL LOW (ref 39.0–52.0)
Hemoglobin: 9.5 g/dL — ABNORMAL LOW (ref 13.0–17.0)
MCH: 32.2 pg (ref 26.0–34.0)
MCHC: 35.6 g/dL (ref 30.0–36.0)
MCV: 90.5 fL (ref 80.0–100.0)
Platelets: 198 K/uL (ref 150–400)
RBC: 2.95 MIL/uL — ABNORMAL LOW (ref 4.22–5.81)
RDW: 13.1 % (ref 11.5–15.5)
WBC: 11.9 K/uL — ABNORMAL HIGH (ref 4.0–10.5)
nRBC: 0 % (ref 0.0–0.2)

## 2020-02-28 LAB — BRAIN NATRIURETIC PEPTIDE: B Natriuretic Peptide: 1514.4 pg/mL — ABNORMAL HIGH (ref 0.0–100.0)

## 2020-02-28 LAB — LIPASE, BLOOD: Lipase: 17 U/L (ref 11–51)

## 2020-02-28 MED ORDER — ACETAMINOPHEN 325 MG PO TABS: 650.0000 mg | ORAL_TABLET | ORAL | Status: DC | PRN

## 2020-02-28 MED ORDER — ONDANSETRON HCL 4 MG/2ML IJ SOLN: 4.0000 mg | Freq: Four times a day (QID) | INTRAMUSCULAR | Status: DC | PRN

## 2020-02-28 MED ORDER — SODIUM CHLORIDE 0.9 % IV SOLN: 250.0000 mL | INTRAVENOUS | Status: DC | PRN

## 2020-02-28 MED ORDER — PROMETHAZINE HCL 25 MG/ML IJ SOLN
12.5000 mg | Freq: Once | INTRAMUSCULAR | Status: AC
  Administered 2020-02-28: 12.5 mg via INTRAVENOUS
  Filled 2020-02-28: qty 1

## 2020-02-28 MED ORDER — IOHEXOL 9 MG/ML PO SOLN: 500.0000 mL | ORAL | Status: AC

## 2020-02-28 MED ORDER — ASPIRIN EC 81 MG PO TBEC
81.0000 mg | DELAYED_RELEASE_TABLET | Freq: Every day | ORAL | Status: DC
  Administered 2020-02-29 – 2020-03-02 (×3): 81 mg via ORAL
  Filled 2020-02-28 (×3): qty 1

## 2020-02-28 MED ORDER — SODIUM CHLORIDE 0.9% FLUSH
3.0000 mL | Freq: Two times a day (BID) | INTRAVENOUS | Status: DC
  Administered 2020-02-29 – 2020-03-02 (×5): 3 mL via INTRAVENOUS

## 2020-02-28 MED ORDER — SODIUM CHLORIDE 0.9% FLUSH: 3.0000 mL | Freq: Once | INTRAVENOUS | Status: DC

## 2020-02-28 MED ORDER — ONDANSETRON HCL 4 MG/2ML IJ SOLN
4.0000 mg | Freq: Once | INTRAMUSCULAR | Status: AC
  Administered 2020-02-28: 4 mg via INTRAVENOUS
  Filled 2020-02-28: qty 2

## 2020-02-28 MED ORDER — LACTATED RINGERS IV BOLUS
1000.0000 mL | Freq: Once | INTRAVENOUS | Status: AC
  Administered 2020-02-28: 1000 mL via INTRAVENOUS

## 2020-02-28 MED ORDER — FUROSEMIDE 10 MG/ML IJ SOLN: 40.0000 mg | Freq: Once | INTRAMUSCULAR | Status: DC

## 2020-02-28 MED ORDER — SODIUM CHLORIDE 0.9% FLUSH: 3.0000 mL | INTRAVENOUS | Status: DC | PRN

## 2020-02-28 MED ORDER — INSULIN ASPART 100 UNIT/ML ~~LOC~~ SOLN
0.0000 [IU] | Freq: Three times a day (TID) | SUBCUTANEOUS | Status: DC
  Administered 2020-02-29: 09:00:00 8 [IU] via SUBCUTANEOUS
  Administered 2020-02-29 – 2020-03-01 (×2): 3 [IU] via SUBCUTANEOUS
  Administered 2020-03-01: 5 [IU] via SUBCUTANEOUS
  Administered 2020-03-01: 16:00:00 3 [IU] via SUBCUTANEOUS
  Administered 2020-03-02: 09:00:00 5 [IU] via SUBCUTANEOUS
  Administered 2020-03-02: 8 [IU] via SUBCUTANEOUS
  Filled 2020-02-28 (×7): qty 1

## 2020-02-28 NOTE — H&P (Signed)
History and Physical    KLEBER CREAN LKT:[id number] DOB: [birth year] DOA: 02/28/2020   PCP: [name], MD   Outpatient Specialists: [facility].  Patient coming from: Home  Chief Complaint:  Nausea/Vomiting.  HPI: Rick Zuniga is a 74 y.o. male with medical history significant oftype 2 diabetes mellitus, hypertension, CAD status post CABG, COPD and osteoarthritis,DVT on Eliquis with c/o Nausea and vomiting,He feels weak and malaised but has not had any cough, chest pain, shortness of breath, dysuria, or hematuria.Pt also has sob that has been getting worse progressively. Pt reports that he has been told his heart function has decreased. Today he woke up with nausea and has not been feeling well.  Wife at bedside and is concerned he is very weak and cant ambulate and take care of himself. D/W wife that his presentation is a combination of chf and renal failure.He does take Advil as well and has been established with nephrology from his recent admission.  ED Course:  Vitals:   02/28/20 1711  BP: (!) 150/86  Pulse: 96  Resp: (!) 22  Temp: 97.6 F (36.4 C)  SpO2: 98%  Labs shows glucose of 251 and creatinine of 2.50, and baseline of 1.8,Hb of 9.5 pt has combination of ACD and AID.  Review of Systems: As per HPI otherwise 10 point review of systems negative.    Past Medical History:  Diagnosis Date  . Arthritis   . COPD (chronic obstructive pulmonary disease) ([place])   . Diabetes ([place])   . Heart disease   . Hypertension   . Indigestion   . Thrombocytopenia ([place])   . Wears dentures    full upper and lower    Past Surgical History:  Procedure Laterality Date  . APPENDECTOMY    . CARDIAC SURGERY     bypass  . CHOLECYSTECTOMY    . COLONOSCOPY  12/23/2010     reports that he has never smoked. He has never used smokeless tobacco. He reports that he does not drink alcohol and does not use drugs.  Allergies  Allergen Reactions  . Pioglitazone  Swelling  . Metformin And Related Diarrhea  . Other     Pt reported that he is allergic to Ace wrap.  Reaction include severe skin irritation.  . Atorvastatin Rash  . Benazepril Rash  . Latex Rash    (tape only)  . Tape Rash    Family History  Problem Relation Age of Onset  . CAD Mother   . Kidney failure Father   . Diabetes Father      Prior to Admission medications   Medication Sig Start Date End Date Taking? Authorizing Provider  amLODipine (NORVASC) 10 MG tablet Take 1 tablet (10 mg total) by mouth daily. 05/26/19 02/08/20  Vaughan Basta, MD  amoxicillin-clavulanate (AUGMENTIN) 500-125 MG tablet Take 1 tablet (500 mg total) by mouth 2 (two) times daily. 02/15/20   Ezekiel Slocumb, DO  apixaban (ELIQUIS) 5 MG TABS tablet Take 1 tablet (5 mg total) by mouth 2 (two) times daily. 02/16/20   Ezekiel Slocumb, DO  aspirin EC 81 MG tablet Take 81 mg by mouth daily.     [provider]  carvedilol (COREG) 6.25 MG tablet Take 6.25 mg by mouth 2 (two) times daily with a meal.    [provider]  feeding supplement, ENSURE ENLIVE, (ENSURE ENLIVE) LIQD Take 237 mLs by mouth 2 (two) times daily between meals. 02/15/20   Ezekiel Slocumb, DO  ferrous sulfate 325 (65 FE) MG tablet Take 1 tablet (325 mg total) by mouth 2 (two) times daily with a meal. 02/15/20   Nicole Kindred A, DO  glipiZIDE (GLUCOTROL) 10 MG tablet Take 10 mg by mouth 2 (two) times daily.     [provider]  insulin NPH-regular Human (NOVOLIN 70/30) (70-30) 100 UNIT/ML injection Inject 10 Units into the skin 2 (two) times daily.     [provider]  isosorbide mononitrate (IMDUR) 30 MG 24 hr tablet Take 30 mg by mouth daily. 08/19/19   [provider]  nitroGLYCERIN (NITROSTAT) 0.4 MG SL tablet Place 0.4 mg under the tongue every 5 (five) minutes as needed for chest pain.    [provider]  torsemide (DEMADEX) 20 MG tablet Take 20 mg by mouth 2 (two) times daily.   12/05/19   [provider]    Physical Exam: Vitals:   02/28/20 1709 02/28/20 1711  BP:  (!) 150/86  Pulse:  96  Resp:  (!) 22  Temp:  97.6 F (36.4 C)  TempSrc:  Oral  SpO2:  98%  Weight: 81.6 kg   Height: 6\' 1"  (1.854 m)    Constitutional: NAD, calm, comfortable Vitals:   02/28/20 1709 02/28/20 1711  BP:  (!) 150/86  Pulse:  96  Resp:  (!) 22  Temp:  97.6 F (36.4 C)  TempSrc:  Oral  SpO2:  98%  Weight: 81.6 kg   Height: 6\' 1"  (1.854 m)    Eyes: PERRL, lids and conjunctivae normal ENMT: Mucous membranes are moist. Posterior pharynx clear of any exudate or lesions.Normal dentition.  Neck: normal, supple, no masses, no thyromegaly Respiratory: CLATBL. Cardiovascular: Regular rate and rhythm, no murmurs / rubs / gallops.Bilateral 3+ bilateral lower extremity pitting edema, left more than right.2+ pedal pulses. No carotid bruits.  Abdomen: no tenderness, no masses palpated. No hepatosplenomegaly. Bowel sounds positive.  Musculoskeletal: no clubbing / cyanosis. No joint deformity upper and lower extremities. Good ROM, no contractures. Normal muscle tone.  Skin: no rashes, lesions, ulcers. No induration Neurologic: CN 2-12 grossly intact. Pt is weak and is unable to pull himself to sitting position Psychiatric: Normal judgment and insight. Alert and oriented x 3. Normal mood.   Labs on Admission: I have personally reviewed following labs and imaging studies  CBC: Recent Labs  Lab 02/28/20 1711  WBC 11.9*  HGB 9.5*  HCT 26.7*  MCV 90.5  PLT 623   Basic Metabolic Panel: Recent Labs  Lab 02/28/20 1711  NA 137  K 5.4*  CL 101  CO2 24  GLUCOSE 321*  BUN 59*  CREATININE 2.50*  CALCIUM 8.9   GFR: Estimated Creatinine Clearance: 28.9 mL/min (A) (by C-G formula based on SCr of 2.5 mg/dL (H)). Liver Function Tests: Recent Labs  Lab 02/28/20 1711  AST 18  ALT 14  ALKPHOS 64  BILITOT 1.9*  PROT 7.5  ALBUMIN 3.8   Recent Labs  Lab 02/28/20 1711    LIPASE 17   Urine analysis:    Component Value Date/Time   COLORURINE YELLOW (A) 02/11/2020 1608   APPEARANCEUR HAZY (A) 02/11/2020 1608   LABSPEC 1.013 02/11/2020 1608   PHURINE 5.0 02/11/2020 1608   GLUCOSEU NEGATIVE 02/11/2020 1608   HGBUR MODERATE (A) 02/11/2020 [place] NEGATIVE 02/11/2020 [place] 02/11/2020 1608   PROTEINUR 30 (A) 02/11/2020 1608   NITRITE NEGATIVE 02/11/2020 1608   LEUKOCYTESUR NEGATIVE 02/11/2020 1608    Radiological Exams  on Admission: CT Abdomen Pelvis Wo Contrast  Result Date: 02/28/2020 CLINICAL DATA:  Abdominal pain with nausea and vomiting EXAM: CT ABDOMEN AND PELVIS WITHOUT CONTRAST TECHNIQUE: Multidetector CT imaging of the abdomen and pelvis was performed following the standard protocol without IV contrast. COMPARISON:  None. FINDINGS: Lower chest: There is new moderate cardiomegaly present. A moderate left and small right pleural effusion is present. Probable adjacent basilar atelectasis seen. Hepatobiliary: Although limited due to the lack of intravenous contrast, normal in appearance without gross focal abnormality. The patient is status post cholecystectomy. No biliary ductal dilation. Pancreas:  Unremarkable.  No surrounding inflammatory changes. Spleen: Normal in size. Although limited due to the lack of intravenous contrast, normal in appearance. Adrenals/Urinary Tract: Both adrenal glands appear normal. The kidneys and collecting system appear normal without evidence of urinary tract calculus or hydronephrosis. Bladder is unremarkable. Stomach/Bowel: The stomach, small bowel, and colon are normal in appearance. No inflammatory changes or obstructive findings. Scattered colonic diverticula are noted without diverticulitis. Vascular/Lymphatic: There are no enlarged abdominal or pelvic lymph nodes. Scattered aortic atherosclerotic calcifications are seen without aneurysmal dilatation. Reproductive: The prostate is unremarkable.  Other: No evidence of abdominal wall mass or hernia. There is diffuse anasarca seen. Musculoskeletal: No acute or significant osseous findings. IMPRESSION: Moderate cardiomegaly Moderate left and small right pleural effusion with adjacent basilar atelectasis Diverticulosis without diverticulitis. Diffuse anasarca Aortic Atherosclerosis (ICD10-I70.0). Electronically Signed   By: Prudencio Pair M.D.   On: 02/28/2020 21:28   DG Chest 2 View  Result Date: 02/28/2020 CLINICAL DATA:  74 year old male with left greater than right pleural effusions. EXAM: CHEST - 2 VIEW COMPARISON:  Chest radiograph 02/13/2020 and earlier. FINDINGS: Seated AP lateral views. Bilateral pleural effusions, moderate on the left and small to moderate on the right. Associated dense lung base opacification, with worsening ventilation at the right lung base from last month. Superimposed increased bilateral pulmonary vascular congestion. Stable cardiac size and mediastinal contours. Prior CABG. Visualized tracheal air column is within normal limits. No acute osseous abnormality identified. Negative visible bowel gas pattern. IMPRESSION: Acute interstitial edema with progressed bilateral pleural effusions since last month, moderate on the left. Electronically Signed   By: Genevie Ann M.D.   On: 02/28/2020 22:21    EKG: Independently reviewed. Sinus rhythm at 88/ IVCD.  Assessment/Plan Dyspnea on exertion and orthopnea with bilateral lower extremity edema. 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 BNP elevated @ 1514 from his previous of 1636. --started on  IV Lasix 60 mg iv q 12 x 2 doses, follow electrolytes.  --Cardiology consult - Placed  Acute kidney injury on chronic kidney disease stage IIIB with proteinuria: Baseline creatinine 1.8, GFR of 37.  -Chronic kidney disease secondary to diabetic nephropathy.  -Avoid  nephrotoxic agents / renally dose needed meds -Avoid NSAID's -Counselling pt on CKD and diet and sodium restriction. -nephrology consult per am team.   Left lower extremity DVT/LLE pain  -Continued on Eliquis --suspect due to DVT, cramping in nature.   -Treated with PRN oxycodone with relief.  Hypertension  -continued on coreg and held amlodipine/ Imdur to allow room for diuretic use.    Type 2 diabetes mellitus  -Hold glipizide, as pt has h/o hypoglycemia.  -Current plan for SSI/ Accucheck and hypoglycemia protocol.  Coronary artery disease  -continue home Imdur and Coreg.  N/V  -iv ppi. - ? If this is gerd or hypoglycemia  related.   DVT prophylaxis: Eliquis. Code Status: Full Family Communication: Wife at bedside Disposition Plan: STR VS [place]  Consults ordered: Cardiology/ PT Admission status:  Inpatient.  Para Skeans MD [facility] If 7PM-7AM, please contact night-coverage [email or web address] Password [facility]  02/28/2020, 11:08 PM

## 2020-02-28 NOTE — ED Triage Notes (Signed)
Pt presents to ED via [facility] with c/o N/V that started earlier today. Pt presents to ED from home. Per EMS pt seen 8 days ago for same. Per EMS pt with PMH of DM and HTN.  CBG 361 150/36 92% RA 86  4mg  Zofran given en route by EMS

## 2020-02-28 NOTE — ED Triage Notes (Signed)
Checked on pt.  Gave something behind neck for comfort.

## 2020-02-28 NOTE — ED Triage Notes (Signed)
updated wife, reassured pt is being checked on and is in more comfortable area.

## 2020-02-28 NOTE — ED Notes (Signed)
Patient transported to CT 

## 2020-02-28 NOTE — ED Provider Notes (Signed)
[facility] Emergency Department Provider Note   ____________________________________________   First MD Initiated Contact with Patient 02/28/20 1949     (approximate)  I have reviewed the triage vital signs and the nursing notes.   HISTORY  Chief Complaint Nausea and Emesis    HPI Rick Zuniga is a 74 y.o. male with past medical history of hypertension, diabetes, CAD status post CABG, CHF, COPD, and DVT on Eliquis who presents to the ED complaining of nausea and vomiting.  Patient reports that ever since last night he has been vomiting every 20 to 30 minutes and been unable to keep anything down.  Is been associated with chills but he denies any fever.  He feels weak and malaised but has not had any cough, chest pain, shortness of breath, dysuria, or hematuria.  He describes symptoms as similar to what he dealt with at the end of June, when he was admitted for 9 days.  He was found to have a new [place] as well as CHF exacerbation at that time, but his kidney function did not improve while he was in the hospital.  He denies any diarrhea, does feel like his abdomen is sore from vomiting.        Past Medical History:  Diagnosis Date  . Arthritis   . COPD (chronic obstructive pulmonary disease) ([place])   . Diabetes ([place])   . Heart disease   . Hypertension   . Indigestion   . Thrombocytopenia ([place])   . Wears dentures    full upper and lower    Patient Active Problem List   Diagnosis Date Noted  . AKI (acute kidney injury) ([place]) 02/08/2020  . 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 Nocturnal leg cramps 12/21/2019  . Pleural effusion 12/08/2019  . Shortness of breath 12/08/2019  . Chest pain 05/25/2019  . Esophageal dysphagia 05/09/2019  . Functional diarrhea 05/09/2019  . RUQ pain 05/09/2019  . Other male erectile dysfunction 11/11/2018  . Lumbar radiculitis 10/17/2018  . Pedal edema 12/14/2017  . Vertical diplopia 11/02/2017  .  Accelerated hypertension 10/27/2017  . Thrombocytopenia ([place]) 02/28/2016  . Anemia 02/28/2016  . Hyperlipidemia 10/23/2014  . CAD (coronary artery disease) 02/22/2014  . Hypertension associated with diabetes ([place]) 02/22/2014  . Type 2 diabetes mellitus with microalbuminuria, with long-term current use of insulin ([place]) 02/22/2014    Past Surgical History:  Procedure Laterality Date  . APPENDECTOMY    . CARDIAC SURGERY     bypass  . CHOLECYSTECTOMY    . COLONOSCOPY  12/23/2010    Prior to Admission medications   Medication Sig Start Date End Date Taking? Authorizing Provider  amLODipine (NORVASC) 10 MG tablet Take 1 tablet (10 mg total) by mouth daily. 05/26/19 02/08/20  Vaughan Basta, MD  amoxicillin-clavulanate (AUGMENTIN) 500-125 MG tablet Take 1 tablet (500 mg total) by mouth 2 (two) times daily. 02/15/20   Ezekiel Slocumb, DO  apixaban (ELIQUIS) 5 MG TABS tablet Take 1 tablet (5 mg total) by mouth 2 (two) times daily. 02/16/20   Ezekiel Slocumb, DO  aspirin EC 81 MG tablet Take 81 mg by mouth daily.     [provider]  carvedilol (COREG) 6.25 MG tablet Take 6.25 mg by mouth 2 (two) times daily with a meal.    [provider]  feeding supplement, ENSURE ENLIVE, (ENSURE ENLIVE) LIQD Take 237 mLs by mouth 2 (two) times daily between meals. 02/15/20   Nicole Kindred A, DO  ferrous sulfate  325 (65 FE) MG tablet Take 1 tablet (325 mg total) by mouth 2 (two) times daily with a meal. 02/15/20   Nicole Kindred A, DO  glipiZIDE (GLUCOTROL) 10 MG tablet Take 10 mg by mouth 2 (two) times daily.     [provider]  insulin NPH-regular Human (NOVOLIN 70/30) (70-30) 100 UNIT/ML injection Inject 10 Units into the skin 2 (two) times daily.     [provider]  isosorbide mononitrate (IMDUR) 30 MG 24 hr tablet Take 30 mg by mouth daily. 08/19/19   [provider]  nitroGLYCERIN (NITROSTAT) 0.4 MG SL tablet Place 0.4 mg under the tongue every 5 (five)  minutes as needed for chest pain.    [provider]  torsemide (DEMADEX) 20 MG tablet Take 20 mg by mouth 2 (two) times daily.  12/05/19   [provider]    Allergies Pioglitazone, Metformin and related, Other, Atorvastatin, Benazepril, Latex, and Tape  Family History  Problem Relation Age of Onset  . CAD Mother   . Kidney failure Father   . Diabetes Father     Social History Social History   Tobacco Use  . Smoking status: Never Smoker  . Smokeless tobacco: Never Used  Vaping Use  . Vaping Use: Never used  Substance Use Topics  . Alcohol use: No  . Drug use: No    Review of Systems  Constitutional: Positive for chills, negative for fevers.  Positive for weakness and malaise. Eyes: No visual changes. ENT: No sore throat. Cardiovascular: Denies chest pain. Respiratory: Denies shortness of breath. Gastrointestinal: Positive for abdominal pain, nausea, and vomiting.  No diarrhea.  No constipation. Genitourinary: Negative for dysuria. Musculoskeletal: Negative for back pain. Skin: Negative for rash. Neurological: Negative for headaches, focal weakness or numbness.  ____________________________________________   PHYSICAL EXAM:  VITAL SIGNS: ED Triage Vitals  Enc Vitals Group     BP 02/28/20 1711 (!) 150/86     Pulse Rate 02/28/20 1711 96     Resp 02/28/20 1711 (!) 22     Temp 02/28/20 1711 97.6 F (36.4 C)     Temp Source 02/28/20 1711 Oral     SpO2 02/28/20 1711 98 %     Weight 02/28/20 1709 180 lb (81.6 kg)     Height 02/28/20 1709 6\' 1"  (1.854 m)     Head Circumference --      Peak Flow --      Pain Score 02/28/20 1709 0     Pain Loc --      Pain Edu? --      Excl. in [place]? --     Constitutional: Alert and oriented. Eyes: Conjunctivae are normal. Head: Atraumatic. Nose: No congestion/rhinnorhea. Mouth/Throat: Mucous membranes are dry. Neck: Normal ROM Cardiovascular: Normal rate, regular rhythm. Grossly normal heart  sounds. Respiratory: Normal respiratory effort.  No retractions. Lungs CTAB. Gastrointestinal: Soft and diffusely tender to palpation with no rebound or guarding. No distention. Genitourinary: deferred Musculoskeletal: No lower extremity tenderness nor edema. Neurologic:  Normal speech and language. No gross focal neurologic deficits are appreciated. Skin:  Skin is warm, dry and intact. No rash noted. Psychiatric: Mood and affect are normal. Speech and behavior are normal.  ____________________________________________   LABS (all labs ordered are listed, but only abnormal results are displayed)  Labs Reviewed  COMPREHENSIVE METABOLIC PANEL - Abnormal; Notable for the following components:      Result Value   Potassium 5.4 (*)    Glucose, Bld 321 (*)  BUN 59 (*)    Creatinine, Ser 2.50 (*)    Total Bilirubin 1.9 (*)    GFR calc non Af Amer 24 (*)    GFR calc Af Amer 28 (*)    All other components within normal limits  CBC - Abnormal; Notable for the following components:   WBC 11.9 (*)    RBC 2.95 (*)    Hemoglobin 9.5 (*)    HCT 26.7 (*)    All other components within normal limits  BRAIN NATRIURETIC PEPTIDE - Abnormal; Notable for the following components:   B Natriuretic Peptide 1,514.4 (*)    All other components within normal limits  SARS CORONAVIRUS 2 BY RT PCR (HOSPITAL ORDER, [place] LAB)  LIPASE, BLOOD  URINALYSIS, COMPLETE (UACMP) WITH MICROSCOPIC  TROPONIN I (HIGH SENSITIVITY)  TROPONIN I (HIGH SENSITIVITY)  TROPONIN I (HIGH SENSITIVITY)   ____________________________________________  EKG  ED ECG REPORT I, Blake Divine, the attending physician, personally viewed and interpreted this ECG.   Date: 02/28/2020  EKG Time: 17:11  Rate: 88  Rhythm: normal sinus rhythm  Axis: LAD  Intervals:nonspecific intraventricular conduction delay  ST&T Change: Lateral T wave inversions, similar to prior   PROCEDURES  Procedure(s)  performed (including Critical Care):  Procedures   ____________________________________________   INITIAL IMPRESSION / ASSESSMENT AND PLAN / ED COURSE       74 year old male with past medical history of CAD status post CABG, COPD, hypertension, diabetes, CHF, DVT on Eliquis presents to the ED with persistent nausea and vomiting since last night with diffuse abdominal discomfort but no diarrhea.  He also endorses chills with generalized weakness and malaise, continues to feel nauseous with active dry heaving on my assessment.  Abdomen is diffusely tender to palpation and we will further assess with CT scan.  Lab work thus far shows renal function to be stable but with mild hyperkalemia.  We will hydrate with IV fluids, treat with IV Zofran, and reevaluate.  Patient continues to feel nauseous and vomited again after receiving Zofran.  CT was negative for intra-abdominal process however shows moderately sized bilateral pleural effusions.  Patient also with elevated BNP and effusions are likely due to CHF exacerbation.  We will diurese with IV Lasix and case was discussed with hospitalist for admission.      ____________________________________________   FINAL CLINICAL IMPRESSION(S) / ED DIAGNOSES  Final diagnoses:  Intractable vomiting with nausea, unspecified vomiting type  Pleural effusion     ED Discharge Orders    None       Note:  This document was prepared using Dragon voice recognition software and may include unintentional dictation errors.   Blake Divine, MD 02/28/20 2320

## 2020-02-29 DIAGNOSIS — N179 Acute kidney failure, unspecified: Secondary | ICD-10-CM | POA: Diagnosis not present

## 2020-02-29 DIAGNOSIS — N189 Chronic kidney disease, unspecified: Secondary | ICD-10-CM | POA: Diagnosis not present

## 2020-02-29 DIAGNOSIS — I5033 Acute on chronic diastolic (congestive) heart failure: Secondary | ICD-10-CM

## 2020-02-29 DIAGNOSIS — I5043 Acute on chronic combined systolic (congestive) and diastolic (congestive) heart failure: Secondary | ICD-10-CM

## 2020-02-29 LAB — BASIC METABOLIC PANEL
Anion gap: 13 (ref 5–15)
BUN: 67 mg/dL — ABNORMAL HIGH (ref 8–23)
CO2: 23 mmol/L (ref 22–32)
Calcium: 8.6 mg/dL — ABNORMAL LOW (ref 8.9–10.3)
Chloride: 101 mmol/L (ref 98–111)
Creatinine, Ser: 2.73 mg/dL — ABNORMAL HIGH (ref 0.61–1.24)
GFR calc Af Amer: 25 mL/min — ABNORMAL LOW (ref >60)
GFR calc non Af Amer: 22 mL/min — ABNORMAL LOW (ref >60)
Glucose, Bld: 320 mg/dL — ABNORMAL HIGH (ref 70–99)
Potassium: 5.6 mmol/L — ABNORMAL HIGH (ref 3.5–5.1)
Sodium: 137 mmol/L (ref 135–145)

## 2020-02-29 LAB — GLUCOSE, CAPILLARY
Glucose-Capillary: 294 mg/dL — ABNORMAL HIGH (ref 70–99)
Glucose-Capillary: 181 mg/dL — ABNORMAL HIGH (ref 70–99)
Glucose-Capillary: 67 mg/dL — ABNORMAL LOW (ref 70–99)
Glucose-Capillary: 95 mg/dL (ref 70–99)
Glucose-Capillary: 155 mg/dL — ABNORMAL HIGH (ref 70–99)

## 2020-02-29 LAB — SARS CORONAVIRUS 2 BY RT PCR (HOSPITAL ORDER, PERFORMED IN ~~LOC~~ HOSPITAL LAB): SARS Coronavirus 2: NEGATIVE

## 2020-02-29 LAB — MAGNESIUM: Magnesium: 2.2 mg/dL (ref 1.7–2.4)

## 2020-02-29 LAB — POTASSIUM: Potassium: 4.1 mmol/L (ref 3.5–5.1)

## 2020-02-29 MED ORDER — NITROGLYCERIN 0.4 MG SL SUBL: 0.4000 mg | SUBLINGUAL_TABLET | SUBLINGUAL | Status: DC | PRN

## 2020-02-29 MED ORDER — SODIUM POLYSTYRENE SULFONATE 15 GM/60ML PO SUSP
30.0000 g | Freq: Once | ORAL | Status: AC
  Administered 2020-02-29: 30 g via ORAL
  Filled 2020-02-29: qty 120

## 2020-02-29 MED ORDER — APIXABAN 5 MG PO TABS
5.0000 mg | ORAL_TABLET | Freq: Two times a day (BID) | ORAL | Status: DC
  Administered 2020-02-29 – 2020-03-02 (×6): 5 mg via ORAL
  Filled 2020-02-29 (×5): qty 1

## 2020-02-29 MED ORDER — FUROSEMIDE 10 MG/ML IJ SOLN
5.0000 mg/h | INTRAVENOUS | Status: DC
  Administered 2020-02-29: 16:00:00 5 mg/h via INTRAVENOUS
  Filled 2020-02-29: qty 25

## 2020-02-29 MED ORDER — PATIROMER SORBITEX CALCIUM 8.4 G PO PACK
8.4000 g | PACK | Freq: Every day | ORAL | Status: DC
  Administered 2020-02-29: 15:00:00 8.4 g via ORAL
  Filled 2020-02-29 (×2): qty 1

## 2020-02-29 MED ORDER — CARVEDILOL 3.125 MG PO TABS
6.2500 mg | ORAL_TABLET | Freq: Two times a day (BID) | ORAL | Status: DC
  Administered 2020-02-29 – 2020-03-02 (×5): 6.25 mg via ORAL
  Filled 2020-02-29 (×5): qty 2

## 2020-02-29 MED ORDER — OXYCODONE HCL 5 MG PO TABS: 5.0000 mg | ORAL_TABLET | ORAL | Status: DC | PRN

## 2020-02-29 MED ORDER — FUROSEMIDE 10 MG/ML IJ SOLN
60.0000 mg | Freq: Two times a day (BID) | INTRAMUSCULAR | Status: AC
  Administered 2020-02-29 (×2): 60 mg via INTRAVENOUS
  Filled 2020-02-29 (×2): qty 6

## 2020-02-29 MED ORDER — GLUCERNA SHAKE PO LIQD
237.0000 mL | Freq: Two times a day (BID) | ORAL | Status: DC
  Administered 2020-02-29 – 2020-03-02 (×4): 237 mL via ORAL

## 2020-02-29 NOTE — Progress Notes (Signed)
[facility]  ROUNDING NOTE   Subjective:  Patient well-known to [place] from prior admission. Came in this time with nausea and vomiting. Also reports weakness and malaise. Has known chronic kidney disease.  However recently his renal function appears to be deteriorated. Creatinine now up to 2.73. Elevated BNP also noted. Chest x-ray shows pleural effusions and pulmonary edema.  Objective:  Vital signs in last 24 hours:  Temp:  [97.6 F (36.4 C)-98.5 F (36.9 C)] 98.3 F (36.8 C) (07/14 1203) Pulse Rate:  [69-96] 69 (07/14 1203) Resp:  [16-22] 18 (07/14 1203) BP: (113-150)/(61-86) 115/68 (07/14 1203) SpO2:  [86 %-98 %] 91 % (07/14 1203) Weight:  [81.6 kg-85.5 kg] 85.5 kg (07/14 0500)  Weight change:  Filed Weights   02/28/20 1709 02/29/20 0500  Weight: 81.6 kg 85.5 kg    Intake/Output: I/O last 3 completed shifts: In: -  Out: 700 [Urine:700]   Intake/Output this shift:  Total I/O In: 240 [P.O.:240] Out: 500 [Urine:500]  Physical Exam: General: No acute distress  Head: Normocephalic, atraumatic. Moist oral mucosal membranes  Eyes: Anicteric  Neck: Supple, trachea midline  Lungs:  Basilar rales, diminished at the bases  Heart: S1S2 no rubs  Abdomen:  Soft, nontender, bowel sounds present  Extremities: 2+ peripheral edema.  Neurologic: Awake, alert, following commands  Skin: No lesions       Basic Metabolic Panel: Recent Labs  Lab 02/28/20 1711 02/28/20 2035 02/29/20 0428  NA 137  --  137  K 5.4*  --  5.6*  CL 101  --  101  CO2 24  --  23  GLUCOSE 321*  --  320*  BUN 59*  --  67*  CREATININE 2.50*  --  2.73*  CALCIUM 8.9  --  8.6*  MG  --  2.2  --     Liver Function Tests: Recent Labs  Lab 02/28/20 1711  AST 18  ALT 14  ALKPHOS 64  BILITOT 1.9*  PROT 7.5  ALBUMIN 3.8   Recent Labs  Lab 02/28/20 1711  LIPASE 17   No results for input(s): AMMONIA in the last 168 hours.  CBC: Recent Labs  Lab 02/28/20 1711  WBC 11.9*   HGB 9.5*  HCT 26.7*  MCV 90.5  PLT 198    Cardiac Enzymes: No results for input(s): CKTOTAL, CKMB, CKMBINDEX, TROPONINI in the last 168 hours.  BNP: Invalid input(s): POCBNP  CBG: Recent Labs  Lab 02/29/20 0756 02/29/20 1126  GLUCAP 294* 181*    Microbiology: Results for orders placed or performed during the hospital encounter of 02/28/20  SARS Coronavirus 2 by RT PCR (hospital order, performed in [facility] hospital lab) Nasopharyngeal Nasopharyngeal Swab     Status: None   Collection Time: 02/29/20 12:29 AM   Specimen: Nasopharyngeal Swab  Result Value Ref Range Status   SARS Coronavirus 2 NEGATIVE NEGATIVE Final    Comment: (NOTE) SARS-CoV-2 target nucleic acids are NOT DETECTED.  The SARS-CoV-2 RNA is generally detectable in upper and lower respiratory specimens during the acute phase of infection. The lowest concentration of SARS-CoV-2 viral copies this assay can detect is 250 copies / mL. A negative result does not preclude SARS-CoV-2 infection and should not be used as the sole basis for treatment or other patient management decisions.  A negative result may occur with improper specimen collection / handling, submission of specimen other than nasopharyngeal swab, presence of viral mutation(s) within the areas targeted by this assay, and inadequate number of viral  copies (<250 copies / mL). A negative result must be combined with clinical observations, patient history, and epidemiological information.  Fact Sheet for Patients:   StrictlyIdeas.no  Fact Sheet for Healthcare Providers: BankingDealers.co.za  This test is not yet approved or  cleared by the [place] FDA and has been authorized for detection and/or diagnosis of SARS-CoV-2 by FDA under an Emergency Use Authorization (EUA).  This EUA will remain in effect (meaning this test can be used) for the duration of the COVID-19 declaration under Section  564(b)(1) of the Act, 21 U.S.C. section 360bbb-3(b)(1), unless the authorization is terminated or revoked sooner.  Performed at [facility], [address] [place],  [address]     Coagulation Studies: No results for input(s): LABPROT, INR in the last 72 hours.  Urinalysis: No results for input(s): COLORURINE, LABSPEC, PHURINE, GLUCOSEU, HGBUR, BILIRUBINUR, KETONESUR, PROTEINUR, UROBILINOGEN, NITRITE, LEUKOCYTESUR in the last 72 hours.  Invalid input(s): APPERANCEUR    Imaging: CT Abdomen Pelvis Wo Contrast  Result Date: 02/28/2020 CLINICAL DATA:  Abdominal pain with nausea and vomiting EXAM: CT ABDOMEN AND PELVIS WITHOUT CONTRAST TECHNIQUE: Multidetector CT imaging of the abdomen and pelvis was performed following the standard protocol without IV contrast. COMPARISON:  None. FINDINGS: Lower chest: There is new moderate cardiomegaly present. A moderate left and small right pleural effusion is present. Probable adjacent basilar atelectasis seen. Hepatobiliary: Although limited due to the lack of intravenous contrast, normal in appearance without gross focal abnormality. The patient is status post cholecystectomy. No biliary ductal dilation. Pancreas:  Unremarkable.  No surrounding inflammatory changes. Spleen: Normal in size. Although limited due to the lack of intravenous contrast, normal in appearance. Adrenals/Urinary Tract: Both adrenal glands appear normal. The kidneys and collecting system appear normal without evidence of urinary tract calculus or hydronephrosis. Bladder is unremarkable. Stomach/Bowel: The stomach, small bowel, and colon are normal in appearance. No inflammatory changes or obstructive findings. Scattered colonic diverticula are noted without diverticulitis. Vascular/Lymphatic: There are no enlarged abdominal or pelvic lymph nodes. Scattered aortic atherosclerotic calcifications are seen without aneurysmal dilatation. Reproductive: The prostate is  unremarkable. Other: No evidence of abdominal wall mass or hernia. There is diffuse anasarca seen. Musculoskeletal: No acute or significant osseous findings. IMPRESSION: Moderate cardiomegaly Moderate left and small right pleural effusion with adjacent basilar atelectasis Diverticulosis without diverticulitis. Diffuse anasarca Aortic Atherosclerosis (ICD10-I70.0). Electronically Signed   By: Prudencio Pair M.D.   On: 02/28/2020 21:28   DG Chest 2 View  Result Date: 02/28/2020 CLINICAL DATA:  74 year old male with left greater than right pleural effusions. EXAM: CHEST - 2 VIEW COMPARISON:  Chest radiograph 02/13/2020 and earlier. FINDINGS: Seated AP lateral views. Bilateral pleural effusions, moderate on the left and small to moderate on the right. Associated dense lung base opacification, with worsening ventilation at the right lung base from last month. Superimposed increased bilateral pulmonary vascular congestion. Stable cardiac size and mediastinal contours. Prior CABG. Visualized tracheal air column is within normal limits. No acute osseous abnormality identified. Negative visible bowel gas pattern. IMPRESSION: Acute interstitial edema with progressed bilateral pleural effusions since last month, moderate on the left. Electronically Signed   By: Genevie Ann M.D.   On: 02/28/2020 22:21     Medications:   . sodium chloride     . apixaban  5 mg Oral BID  . aspirin EC  81 mg Oral Daily  . carvedilol  6.25 mg Oral BID WC  . feeding supplement (GLUCERNA SHAKE)  237 mL Oral BID BM  .  insulin aspart  0-15 Units Subcutaneous TID WC  . sodium chloride flush  3 mL Intravenous Once  . sodium chloride flush  3 mL Intravenous Q12H   sodium chloride, acetaminophen, nitroGLYCERIN, ondansetron (ZOFRAN) IV, oxyCODONE, sodium chloride flush  Assessment/ Plan:  74 y.o. male with COPD, insulin dependent diabetes mellitus type 2, coronary artery disease, hypertension, thrombocytopenia, who was admitted with nausea  and vomiting and also found to have dyspnea with exertion and acute on chronic systolic and diastolic heart failure.  Patient also found to have acute kidney injury.  1.  Acute kidney injury/chronic kidney disease stage IIIb/diabetes mellitus type 2 with chronic kidney disease.  Recently his baseline creatinine has been degraded.  In addition he has significant generalized edema and pleural effusions as well as pulmonary edema.  We will start the patient on Lasix drip at 5 mg/h and follow urine output closely.  2.  Acute on chronic systolic heart failure exacerbation.  As above we will be starting Lasix drip.  3.  Hypertension.  Maintain the patient on carvedilol 6.25 mg twice daily at this time.  4.  Hyperkalemia.  Serum potassium 5.6.  Start the patient on Veltassa 8.4 g p.o. daily.   LOS: 1 Dariane Natzke 7/14/20212:13 PM

## 2020-02-29 NOTE — Progress Notes (Signed)
Inpatient Diabetes Program Recommendations  AACE/ADA: New Consensus Statement on Inpatient Glycemic Control (2015)  Target Ranges:  Prepandial:   less than 140 mg/dL      Peak postprandial:   less than 180 mg/dL (1-2 hours)      Critically ill patients:  140 - 180 mg/dL   Results for Rick Zuniga, Rick Zuniga (MRN [id number]) as of 02/29/2020 10:14  Ref. Range 02/29/2020 07:56  Glucose-Capillary Latest Ref Range: 70 - 99 mg/dL 294 (H)  8 units NOVOLOG     Admit with: Dyspnea on exertion and orthopnea with bilateral lower extremity edema/ Acute on chronic systolic and diastolic CHF exacerbation/ Acute kidney injury on chronic kidney disease   History: DM, COPD  Home DM Meds: Glipizide 10 mg BID       70/30 Insulin 8-10 units BID  Current Orders: Novolog Moderate Correction Scale/ SSI (0-15 units) TID AC      MD- Note patient takes 70/30 Insulin BID at home.  Not an ideal insulin when patient having poor PO intake.  CBG was 294 this AM.  Please consider adding weight based dose of basal insulin to hospital regimen--Can restart 70/30 Insulin at time of d/c home:  Consider Levemir 9 units Daily (0.1 unit/kg)--Please start today     --Will follow patient during hospitalization--  Wyn Quaker RN, MSN, CDE Diabetes Coordinator Inpatient Glycemic Control Team Team Pager: [phone] (8a-5p)

## 2020-02-29 NOTE — Progress Notes (Signed)
Since pt. Arrive at the floor, V/S taken, SCD applied,  Tele X2 applied, continuous O2 via [place] applied, water pitcher with fluid, urinal, hygiene stuff provided,  extra Blankets and linen provided by the primary RN.

## 2020-02-29 NOTE — Discharge Instructions (Signed)

## 2020-02-29 NOTE — TOC Initial Note (Signed)
Transition of Care [facility]) - Initial/Assessment Note    Patient Details  Name: Rick Zuniga MRN: [id number] Date of Birth: Apr 28, 1946  Transition of Care [facility] CM/SW Contact:    Shelbie Hutching, RN Phone Number: 02/29/2020, 10:28 AM  Clinical Narrative:                  Patient admitted to the hospital for nausea and vomiting, shortness of breath and pleural effusion.  Patient was recently discharged on 6/30 for acute renal failure.  Patient's significant other Letta Median is at the bedside with the patient and reports that things did not go great once he went home.  Letta Median reports that home health did come out once but she and the patient both misunderstood what home health did and the patient ultimately refused for PT to come back out.  Chery Rose with [facility] reports that patient is still open with them and they have been providing services and they will pick the patient back up once he is discharged.  Patient's significant other does not live with the patient and tends to confuse certain information. Patient is currently sleeping.   RNCM will cont to follow and assist with discharge planning.   Expected Discharge Plan: [place] Barriers to Discharge: Continued Medical Work up   Patient Goals and CMS Choice   CMS Medicare.gov Compare Post Acute Care list provided to:: Patient Choice offered to / list presented to : Patient  Expected Discharge Plan and Services Expected Discharge Plan: [place]   Discharge Planning Services: CM Consult   Living arrangements for the past 2 months: Single Family Home                                      Prior Living Arrangements/Services Living arrangements for the past 2 months: Single Family Home Lives with:: Self Patient language and need for interpreter reviewed:: Yes Do you feel safe going back to the place where you live?: Yes      Need for Family Participation in Patient Care: Yes (Comment)  (intractable nausea and vomiting) Care giver support system in place?: Yes (comment) (sister, significant other) Current home services: DME (walker) Criminal Activity/Legal Involvement Pertinent to Current Situation/Hospitalization: No - Comment as needed  Activities of Daily Living Home Assistive Devices/Equipment: Eyeglasses ADL Screening (condition at time of admission) Patient's cognitive ability adequate to safely complete daily activities?: Yes Is the patient deaf or have difficulty hearing?: No Does the patient have difficulty seeing, even when wearing glasses/contacts?: Yes Does the patient have difficulty concentrating, remembering, or making decisions?: No Patient able to express need for assistance with ADLs?: Yes Does the patient have difficulty dressing or bathing?: Yes Independently performs ADLs?: No Communication: Independent Grooming: Independent Feeding: Independent Bathing: Needs assistance Is this a change from baseline?: Change from baseline, expected to last <3 days Toileting: Needs assistance Is this a change from baseline?: Change from baseline, expected to last <3 days In/Out Bed: Needs assistance Is this a change from baseline?: Change from baseline, expected to last <3 days Walks in Home: Independent with device (comment) (2 wheels walker) Does the patient have difficulty walking or climbing stairs?: No Weakness of Legs: Both Weakness of Arms/Hands: None  Permission Sought/Granted Permission sought to share information with : Case Manager, Family Supports Permission granted to share information with : Yes, Verbal Permission Granted  Share Information  with NAME: Ileana Roup     Permission granted to share info w Relationship: significant other     Emotional Assessment Appearance:: Appears stated age Attitude/Demeanor/Rapport: Lethargic   Orientation: : Oriented to Self, Oriented to Place, Oriented to  Time, Oriented to Situation Alcohol / Substance  Use: Not Applicable Psych Involvement: No (comment)  Admission diagnosis:  Shortness of breath [R06.02] SOB (shortness of breath) [R06.02] Pleural effusion [J90] Intractable vomiting with nausea, unspecified vomiting type [R11.2] Patient Active Problem List   Diagnosis Date Noted  . SOB (shortness of breath) 02/28/2020  . AKI (acute kidney injury) ([place]) 02/08/2020  . 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 Nocturnal leg cramps 12/21/2019  . Pleural effusion 12/08/2019  . Shortness of breath 12/08/2019  . Chest pain 05/25/2019  . Esophageal dysphagia 05/09/2019  . Functional diarrhea 05/09/2019  . RUQ pain 05/09/2019  . Other male erectile dysfunction 11/11/2018  . Lumbar radiculitis 10/17/2018  . Pedal edema 12/14/2017  . Vertical diplopia 11/02/2017  . Accelerated hypertension 10/27/2017  . Thrombocytopenia ([place]) 02/28/2016  . Anemia 02/28/2016  . Hyperlipidemia 10/23/2014  . CAD (coronary artery disease) 02/22/2014  . Hypertension associated with diabetes ([place]) 02/22/2014  . Type 2 diabetes mellitus with microalbuminuria, with long-term current use of insulin ([place]) 02/22/2014   PCP:  [name], MD Pharmacy:   [facility] [address] [place] [address] Phone: [phone] Fax: [phone]     Social Determinants of Health (SDOH) Interventions    Readmission Risk Interventions No flowsheet data found.

## 2020-02-29 NOTE — Progress Notes (Signed)
Pt with CBG 67 and is asymptomatic, nurse in room and pt given juice and crackers and recovered to 95.

## 2020-02-29 NOTE — Progress Notes (Signed)
PROGRESS NOTE    Rick Zuniga  BWG:[id number] DOB: Nov 06, 1945 DOA: 02/28/2020 PCP: [name], MD  Chief complaint.  Shortness of breath.  Brief Narrative:   Rick Zuniga is a 74 y.o. male with medical history significant oftype 2 diabetes mellitus, hypertension,CAD status post CABG, COPD and osteoarthritis,DVT on Eliquis with c/o Nausea and vomiting,He feels weak and malaised but has not had any cough, chest pain, shortness of breath, dysuria, or hematuria.Pt also has sob that has been getting worse progressively. Pt reports that he has been told his heart function has decreased. Today he woke up with nausea and has not been feeling well.  Wife at bedside and is concerned he is very weak and cant ambulate and take care of himself.  Patient is diagnosed with acute on chronic systolic congestive heart failure, he was placed on IV Lasix.  Echocardiogram showed ejection fraction 40 to 45%.  7/14.  Short of breath much improved, renal function get worse.  Discontinue Lasix.  Obtain nephrology consult.  Also given Kayexalate for hyperkalemia.    Assessment & Plan:   Active Problems:   SOB (shortness of breath)  #1.  Acute on chronic combined systolic and diastolic congestive heart failure. Repeat echocardiogram performed yesterday showed ejection fraction 40 to 17% with diastolic dysfunction.  Patient short of breath much improved.  Renal function is worse, I will discontinue Lasix as patient volume status is much better.  I will start oral Lasix at the time of discharge.  2.  Acute kidney injury on chronic kidney disease stage IIIb with hyperkalemia. Obtain consult from nephrology.  Kayexalate is given for hyperkalemia.  Patient volume status much improved, will discontinue IV Lasix.  #3.  History of left lower extremity DVT.  Continue Eliquis.  #4. essential hypertension. Continue Coreg, Imdur.  #5.  Type 2 diabetes. Hold off oral diabetic medications.  Continue  sliding scale insulin.   DVT prophylaxis: Eliquis Code Status: Full Family Communication: Wife in the room. Disposition Plan:   Patient came from: Home             Anticipated d/c place: Home  Barriers to d/c OR conditions which need to be met to effect a safe d/c:   Consultants:   Nephrology  Procedures: None Antimicrobials:  None Subjective: Patient had a significant nausea vomiting last night, had a very large bowel movement today.  Nausea vomiting has been better today.  No abdominal pain. Short of breath much improved today.  He slept well last night flat.  Did not have any paroxysmal nocturnal dyspnea or orthopnea.  Slight leg edema.  Objective: Vitals:   02/29/20 0357 02/29/20 0500 02/29/20 0828 02/29/20 1203  BP:   125/72 115/68  Pulse:   72 69  Resp:   16 18  Temp:   98.3 F (36.8 C) 98.3 F (36.8 C)  TempSrc:    Oral  SpO2: 97%  95% 91%  Weight:  85.5 kg    Height:        Intake/Output Summary (Last 24 hours) at 02/29/2020 1256 Last data filed at 02/29/2020 1100 Gross per 24 hour  Intake --  Output 1200 ml  Net -1200 ml   Filed Weights   02/28/20 1709 02/29/20 0500  Weight: 81.6 kg 85.5 kg    Examination:  General exam: Appears calm and comfortable  Respiratory system: Decreased breathing sounds without crackles wheezes.Marland Kitchen Respiratory effort normal. Cardiovascular system: S1 & S2 heard, RRR. No JVD, murmurs, rubs, gallops or  clicks. 1+ pedal edema. Gastrointestinal system: Abdomen is nondistended, soft and nontender. No organomegaly or masses felt. Normal bowel sounds heard. Central nervous system: Alert and oriented. No focal neurological deficits. Extremities: Symmetric  Skin: No rashes, lesions or ulcers Psychiatry: Judgement and insight appear normal. Mood & affect appropriate.     Data Reviewed: I have personally reviewed following labs and imaging studies  CBC: Recent Labs  Lab 02/28/20 1711  WBC 11.9*  HGB 9.5*  HCT 26.7*  MCV  90.5  PLT 916   Basic Metabolic Panel: Recent Labs  Lab 02/28/20 1711 02/28/20 2035 02/29/20 0428  NA 137  --  137  K 5.4*  --  5.6*  CL 101  --  101  CO2 24  --  23  GLUCOSE 321*  --  320*  BUN 59*  --  67*  CREATININE 2.50*  --  2.73*  CALCIUM 8.9  --  8.6*  MG  --  2.2  --    GFR: Estimated Creatinine Clearance: 26.4 mL/min (A) (by C-G formula based on SCr of 2.73 mg/dL (H)). Liver Function Tests: Recent Labs  Lab 02/28/20 1711  AST 18  ALT 14  ALKPHOS 64  BILITOT 1.9*  PROT 7.5  ALBUMIN 3.8   Recent Labs  Lab 02/28/20 1711  LIPASE 17   No results for input(s): AMMONIA in the last 168 hours. Coagulation Profile: No results for input(s): INR, PROTIME in the last 168 hours. Cardiac Enzymes: No results for input(s): CKTOTAL, CKMB, CKMBINDEX, TROPONINI in the last 168 hours. BNP (last 3 results) No results for input(s): PROBNP in the last 8760 hours. HbA1C: No results for input(s): HGBA1C in the last 72 hours. CBG: Recent Labs  Lab 02/29/20 0756 02/29/20 1126  GLUCAP 294* 181*   Lipid Profile: No results for input(s): CHOL, HDL, LDLCALC, TRIG, CHOLHDL, LDLDIRECT in the last 72 hours. Thyroid Function Tests: No results for input(s): TSH, T4TOTAL, FREET4, T3FREE, THYROIDAB in the last 72 hours. Anemia Panel: No results for input(s): VITAMINB12, FOLATE, FERRITIN, TIBC, IRON, RETICCTPCT in the last 72 hours. Sepsis Labs: No results for input(s): PROCALCITON, LATICACIDVEN in the last 168 hours.  Recent Results (from the past 240 hour(s))  SARS Coronavirus 2 by RT PCR (hospital order, performed in [facility] hospital lab) Nasopharyngeal Nasopharyngeal Swab     Status: None   Collection Time: 02/29/20 12:29 AM   Specimen: Nasopharyngeal Swab  Result Value Ref Range Status   SARS Coronavirus 2 NEGATIVE NEGATIVE Final    Comment: (NOTE) SARS-CoV-2 target nucleic acids are NOT DETECTED.  The SARS-CoV-2 RNA is generally detectable in upper and  lower respiratory specimens during the acute phase of infection. The lowest concentration of SARS-CoV-2 viral copies this assay can detect is 250 copies / mL. A negative result does not preclude SARS-CoV-2 infection and should not be used as the sole basis for treatment or other patient management decisions.  A negative result may occur with improper specimen collection / handling, submission of specimen other than nasopharyngeal swab, presence of viral mutation(s) within the areas targeted by this assay, and inadequate number of viral copies (<250 copies / mL). A negative result must be combined with clinical observations, patient history, and epidemiological information.  Fact Sheet for Patients:   StrictlyIdeas.no  Fact Sheet for Healthcare Providers: BankingDealers.co.za  This test is not yet approved or  cleared by the [place] FDA and has been authorized for detection and/or diagnosis of SARS-CoV-2 by FDA under an Emergency Use  Authorization (EUA).  This EUA will remain in effect (meaning this test can be used) for the duration of the COVID-19 declaration under Section 564(b)(1) of the Act, 21 U.S.C. section 360bbb-3(b)(1), unless the authorization is terminated or revoked sooner.  Performed at [facility], [address] [place], [place] [address]          Radiology Studies: CT Abdomen Pelvis Wo Contrast  Result Date: 02/28/2020 CLINICAL DATA:  Abdominal pain with nausea and vomiting EXAM: CT ABDOMEN AND PELVIS WITHOUT CONTRAST TECHNIQUE: Multidetector CT imaging of the abdomen and pelvis was performed following the standard protocol without IV contrast. COMPARISON:  None. FINDINGS: Lower chest: There is new moderate cardiomegaly present. A moderate left and small right pleural effusion is present. Probable adjacent basilar atelectasis seen. Hepatobiliary: Although limited due to the lack of intravenous  contrast, normal in appearance without gross focal abnormality. The patient is status post cholecystectomy. No biliary ductal dilation. Pancreas:  Unremarkable.  No surrounding inflammatory changes. Spleen: Normal in size. Although limited due to the lack of intravenous contrast, normal in appearance. Adrenals/Urinary Tract: Both adrenal glands appear normal. The kidneys and collecting system appear normal without evidence of urinary tract calculus or hydronephrosis. Bladder is unremarkable. Stomach/Bowel: The stomach, small bowel, and colon are normal in appearance. No inflammatory changes or obstructive findings. Scattered colonic diverticula are noted without diverticulitis. Vascular/Lymphatic: There are no enlarged abdominal or pelvic lymph nodes. Scattered aortic atherosclerotic calcifications are seen without aneurysmal dilatation. Reproductive: The prostate is unremarkable. Other: No evidence of abdominal wall mass or hernia. There is diffuse anasarca seen. Musculoskeletal: No acute or significant osseous findings. IMPRESSION: Moderate cardiomegaly Moderate left and small right pleural effusion with adjacent basilar atelectasis Diverticulosis without diverticulitis. Diffuse anasarca Aortic Atherosclerosis (ICD10-I70.0). Electronically Signed   By: Prudencio Pair M.D.   On: 02/28/2020 21:28   DG Chest 2 View  Result Date: 02/28/2020 CLINICAL DATA:  74 year old male with left greater than right pleural effusions. EXAM: CHEST - 2 VIEW COMPARISON:  Chest radiograph 02/13/2020 and earlier. FINDINGS: Seated AP lateral views. Bilateral pleural effusions, moderate on the left and small to moderate on the right. Associated dense lung base opacification, with worsening ventilation at the right lung base from last month. Superimposed increased bilateral pulmonary vascular congestion. Stable cardiac size and mediastinal contours. Prior CABG. Visualized tracheal air column is within normal limits. No acute osseous  abnormality identified. Negative visible bowel gas pattern. IMPRESSION: Acute interstitial edema with progressed bilateral pleural effusions since last month, moderate on the left. Electronically Signed   By: Genevie Ann M.D.   On: 02/28/2020 22:21        Scheduled Meds:  apixaban  5 mg Oral BID   aspirin EC  81 mg Oral Daily   carvedilol  6.25 mg Oral BID WC   insulin aspart  0-15 Units Subcutaneous TID WC   sodium chloride flush  3 mL Intravenous Once   sodium chloride flush  3 mL Intravenous Q12H   Continuous Infusions:  sodium chloride       LOS: 1 day    Time spent: 28 minutes    Sharen Hones, MD [facility]   To contact the attending provider between 7A-7P or the covering provider during after hours 7P-7A, please log into the web site [email or web address] and access using universal [place] password for that web site. If you do not have the password, please call the hospital operator.  02/29/2020, 12:56 PM

## 2020-02-29 NOTE — Progress Notes (Signed)
Initial Nutrition Assessment  DOCUMENTATION CODES:   Not applicable  INTERVENTION:  Glucerna Shake po TID, each supplement provides 220 kcal and 10 grams of protein (strawberry or vanilla) Low sodium diet education handout provided on discharge instructions   NUTRITION DIAGNOSIS:   Increased nutrient needs related to chronic illness (chronic sCHF; COPD) as evidenced by estimated needs.  GOAL:   Patient will meet greater than or equal to 90% of their needs    MONITOR:   PO intake, Supplement acceptance, Labs, Weight trends, I & O's  REASON FOR ASSESSMENT:   Malnutrition Screening Tool    ASSESSMENT:  RD working remotely.  74 year old male admitted for acute on chronic systolic CHF with past medical history of DM2, HTN, CAD s/p CABG, COPD, osteoarthritis, DVT on Eliquis presented with progressively worsening SOB, weakness and malaise.  RD attempted to contact pt via phone, sister of patient answered phone this afternoon, she reports that patient is resting. Sister reports that he ate most of his lunch today, no documented intakes for review. Patient drinks Glucerna supplements at home, likes vanilla and strawberry flavors, will order BID to aid with meeting needs. "Low Sodium Nutrition Therapy" handout for [employer] has been attached to discharge instructions for patient.   Per chart, weights have trended up ~8 lbs in the past 3 weeks, suspect d/t fluid shift, noted moderate pitting BLE edema as well as elevated BNP.  Medications reviewed and include: SSI Labs: CBGs 181,294, K 5.6 (H), BUN 67 (H), Cr 2.73 (H), BNP 1514.4 (H) Lab Results  Component Value Date   HGBA1C 9.3 (H) 02/08/2020    NUTRITION - FOCUSED PHYSICAL EXAM: Unable to complete at this time, RD working remotely.  Diet Order:   Diet Order            Diet regular Room service appropriate? Yes; Fluid consistency: Thin  Diet effective now                 EDUCATION NEEDS:    Education needs have been addressed  Skin:  Skin Assessment: Reviewed RN Assessment  Last BM:  7/12  Height:   Ht Readings from Last 1 Encounters:  02/28/20 6\' 1"  (1.854 m)    Weight:   Wt Readings from Last 1 Encounters:  02/29/20 85.5 kg    Ideal Body Weight:  83.6 kg  BMI:  Body mass index is 24.87 kg/m.  Estimated Nutritional Needs:   Kcal:  2100-2300  Protein:  100-110  Fluid:  2 L/day   Lajuan Lines, RD, LDN Clinical Nutrition After Hours/Weekend Pager # in [place]

## 2020-03-01 ENCOUNTER — Ambulatory Visit (INDEPENDENT_AMBULATORY_CARE_PROVIDER_SITE_OTHER): Payer: Medicare Other | Admitting: Nurse Practitioner

## 2020-03-01 DIAGNOSIS — N189 Chronic kidney disease, unspecified: Secondary | ICD-10-CM | POA: Diagnosis not present

## 2020-03-01 DIAGNOSIS — N179 Acute kidney failure, unspecified: Secondary | ICD-10-CM | POA: Diagnosis not present

## 2020-03-01 DIAGNOSIS — I5033 Acute on chronic diastolic (congestive) heart failure: Secondary | ICD-10-CM | POA: Diagnosis not present

## 2020-03-01 LAB — CBC WITH DIFFERENTIAL/PLATELET
Abs Immature Granulocytes: 0.01 10*3/uL (ref 0.00–0.07)
Basophils Absolute: 0 10*3/uL (ref 0.0–0.1)
Basophils Relative: 0 %
Eosinophils Absolute: 0 10*3/uL (ref 0.0–0.5)
Eosinophils Relative: 1 %
HCT: 24.6 % — ABNORMAL LOW (ref 39.0–52.0)
Hemoglobin: 8.3 g/dL — ABNORMAL LOW (ref 13.0–17.0)
Immature Granulocytes: 0 %
Lymphocytes Relative: 20 %
Lymphs Abs: 1 10*3/uL (ref 0.7–4.0)
MCH: 31.6 pg (ref 26.0–34.0)
MCHC: 33.7 g/dL (ref 30.0–36.0)
MCV: 93.5 fL (ref 80.0–100.0)
Monocytes Absolute: 0.4 10*3/uL (ref 0.1–1.0)
Monocytes Relative: 7 %
Neutro Abs: 3.6 10*3/uL (ref 1.7–7.7)
Neutrophils Relative %: 72 %
Platelets: 125 10*3/uL — ABNORMAL LOW (ref 150–400)
RBC: 2.63 MIL/uL — ABNORMAL LOW (ref 4.22–5.81)
RDW: 13.3 % (ref 11.5–15.5)
WBC: 5 10*3/uL (ref 4.0–10.5)
nRBC: 0 % (ref 0.0–0.2)

## 2020-03-01 LAB — GLUCOSE, CAPILLARY
Glucose-Capillary: 160 mg/dL — ABNORMAL HIGH (ref 70–99)
Glucose-Capillary: 206 mg/dL — ABNORMAL HIGH (ref 70–99)
Glucose-Capillary: 160 mg/dL — ABNORMAL HIGH (ref 70–99)
Glucose-Capillary: 238 mg/dL — ABNORMAL HIGH (ref 70–99)

## 2020-03-01 LAB — BASIC METABOLIC PANEL
Anion gap: 12 (ref 5–15)
BUN: 67 mg/dL — ABNORMAL HIGH (ref 8–23)
CO2: 29 mmol/L (ref 22–32)
Calcium: 9 mg/dL (ref 8.9–10.3)
Chloride: 100 mmol/L (ref 98–111)
Creatinine, Ser: 2.49 mg/dL — ABNORMAL HIGH (ref 0.61–1.24)
GFR calc Af Amer: 28 mL/min — ABNORMAL LOW (ref >60)
GFR calc non Af Amer: 24 mL/min — ABNORMAL LOW (ref >60)
Glucose, Bld: 148 mg/dL — ABNORMAL HIGH (ref 70–99)
Potassium: 3.8 mmol/L (ref 3.5–5.1)
Sodium: 141 mmol/L (ref 135–145)

## 2020-03-01 MED ORDER — MECLIZINE HCL 25 MG PO TABS
25.0000 mg | ORAL_TABLET | Freq: Three times a day (TID) | ORAL | Status: DC | PRN
  Administered 2020-03-01 – 2020-03-02 (×2): 25 mg via ORAL
  Filled 2020-03-01 (×3): qty 1

## 2020-03-01 NOTE — Progress Notes (Signed)
[facility]  ROUNDING NOTE   Subjective:  Patient started on Lasix drip. Creatinine down to 2.4. Urine output was 3.4 L over the preceding 24 hours. Patient reports lower extremity edema much improved.  Objective:  Vital signs in last 24 hours:  Temp:  [98.2 F (36.8 C)-98.8 F (37.1 C)] 98.2 F (36.8 C) (07/15 1615) Pulse Rate:  [72-81] 73 (07/15 1615) Resp:  [14-18] 16 (07/15 1615) BP: (123-132)/(68-76) 123/73 (07/15 1615) SpO2:  [96 %-100 %] 99 % (07/15 1615) Weight:  [85 kg] 85 kg (07/15 0500)  Weight change: 3.353 kg Filed Weights   02/28/20 1709 02/29/20 0500 03/01/20 0500  Weight: 81.6 kg 85.5 kg 85 kg    Intake/Output: I/O last 3 completed shifts: In: 252.6 [P.O.:240; I.V.:12.6] Out: 4750 [Urine:4750]   Intake/Output this shift:  Total I/O In: -  Out: 1950 [Urine:1950]  Physical Exam: General: No acute distress  Head: Normocephalic, atraumatic. Moist oral mucosal membranes  Eyes: Anicteric  Neck: Supple, trachea midline  Lungs:  Basilar rales, diminished at the bases  Heart: S1S2 no rubs  Abdomen:  Soft, nontender, bowel sounds present  Extremities: 1+ peripheral edema.  Neurologic: Awake, alert, following commands  Skin: No lesions       Basic Metabolic Panel: Recent Labs  Lab 02/28/20 1711 02/28/20 2035 02/29/20 0428 02/29/20 1356 03/01/20 0509  NA 137  --  137  --  141  K 5.4*  --  5.6* 4.1 3.8  CL 101  --  101  --  100  CO2 24  --  23  --  29  GLUCOSE 321*  --  320*  --  148*  BUN 59*  --  67*  --  67*  CREATININE 2.50*  --  2.73*  --  2.49*  CALCIUM 8.9  --  8.6*  --  9.0  MG  --  2.2  --   --   --     Liver Function Tests: Recent Labs  Lab 02/28/20 1711  AST 18  ALT 14  ALKPHOS 64  BILITOT 1.9*  PROT 7.5  ALBUMIN 3.8   Recent Labs  Lab 02/28/20 1711  LIPASE 17   No results for input(s): AMMONIA in the last 168 hours.  CBC: Recent Labs  Lab 02/28/20 1711 03/01/20 0509  WBC 11.9* 5.0  NEUTROABS  --   3.6  HGB 9.5* 8.3*  HCT 26.7* 24.6*  MCV 90.5 93.5  PLT 198 125*    Cardiac Enzymes: No results for input(s): CKTOTAL, CKMB, CKMBINDEX, TROPONINI in the last 168 hours.  BNP: Invalid input(s): POCBNP  CBG: Recent Labs  Lab 02/29/20 1654 02/29/20 2040 03/01/20 0758 03/01/20 1138 03/01/20 1618  GLUCAP 95 155* 160* 206* 160*    Microbiology: Results for orders placed or performed during the hospital encounter of 02/28/20  SARS Coronavirus 2 by RT PCR (hospital order, performed in [facility] hospital lab) Nasopharyngeal Nasopharyngeal Swab     Status: None   Collection Time: 02/29/20 12:29 AM   Specimen: Nasopharyngeal Swab  Result Value Ref Range Status   SARS Coronavirus 2 NEGATIVE NEGATIVE Final    Comment: (NOTE) SARS-CoV-2 target nucleic acids are NOT DETECTED.  The SARS-CoV-2 RNA is generally detectable in upper and lower respiratory specimens during the acute phase of infection. The lowest concentration of SARS-CoV-2 viral copies this assay can detect is 250 copies / mL. A negative result does not preclude SARS-CoV-2 infection and should not be used as the sole basis for  treatment or other patient management decisions.  A negative result may occur with improper specimen collection / handling, submission of specimen other than nasopharyngeal swab, presence of viral mutation(s) within the areas targeted by this assay, and inadequate number of viral copies (<250 copies / mL). A negative result must be combined with clinical observations, patient history, and epidemiological information.  Fact Sheet for Patients:   StrictlyIdeas.no  Fact Sheet for Healthcare Providers: BankingDealers.co.za  This test is not yet approved or  cleared by the [place] FDA and has been authorized for detection and/or diagnosis of SARS-CoV-2 by FDA under an Emergency Use Authorization (EUA).  This EUA will remain in effect (meaning  this test can be used) for the duration of the COVID-19 declaration under Section 564(b)(1) of the Act, 21 U.S.C. section 360bbb-3(b)(1), unless the authorization is terminated or revoked sooner.  Performed at [facility], [address] [place], [place] [address]     Coagulation Studies: No results for input(s): LABPROT, INR in the last 72 hours.  Urinalysis: No results for input(s): COLORURINE, LABSPEC, PHURINE, GLUCOSEU, HGBUR, BILIRUBINUR, KETONESUR, PROTEINUR, UROBILINOGEN, NITRITE, LEUKOCYTESUR in the last 72 hours.  Invalid input(s): APPERANCEUR    Imaging: CT Abdomen Pelvis Wo Contrast  Result Date: 02/28/2020 CLINICAL DATA:  Abdominal pain with nausea and vomiting EXAM: CT ABDOMEN AND PELVIS WITHOUT CONTRAST TECHNIQUE: Multidetector CT imaging of the abdomen and pelvis was performed following the standard protocol without IV contrast. COMPARISON:  None. FINDINGS: Lower chest: There is new moderate cardiomegaly present. A moderate left and small right pleural effusion is present. Probable adjacent basilar atelectasis seen. Hepatobiliary: Although limited due to the lack of intravenous contrast, normal in appearance without gross focal abnormality. The patient is status post cholecystectomy. No biliary ductal dilation. Pancreas:  Unremarkable.  No surrounding inflammatory changes. Spleen: Normal in size. Although limited due to the lack of intravenous contrast, normal in appearance. Adrenals/Urinary Tract: Both adrenal glands appear normal. The kidneys and collecting system appear normal without evidence of urinary tract calculus or hydronephrosis. Bladder is unremarkable. Stomach/Bowel: The stomach, small bowel, and colon are normal in appearance. No inflammatory changes or obstructive findings. Scattered colonic diverticula are noted without diverticulitis. Vascular/Lymphatic: There are no enlarged abdominal or pelvic lymph nodes. Scattered aortic atherosclerotic  calcifications are seen without aneurysmal dilatation. Reproductive: The prostate is unremarkable. Other: No evidence of abdominal wall mass or hernia. There is diffuse anasarca seen. Musculoskeletal: No acute or significant osseous findings. IMPRESSION: Moderate cardiomegaly Moderate left and small right pleural effusion with adjacent basilar atelectasis Diverticulosis without diverticulitis. Diffuse anasarca Aortic Atherosclerosis (ICD10-I70.0). Electronically Signed   By: Prudencio Pair M.D.   On: 02/28/2020 21:28   DG Chest 2 View  Result Date: 02/28/2020 CLINICAL DATA:  74 year old male with left greater than right pleural effusions. EXAM: CHEST - 2 VIEW COMPARISON:  Chest radiograph 02/13/2020 and earlier. FINDINGS: Seated AP lateral views. Bilateral pleural effusions, moderate on the left and small to moderate on the right. Associated dense lung base opacification, with worsening ventilation at the right lung base from last month. Superimposed increased bilateral pulmonary vascular congestion. Stable cardiac size and mediastinal contours. Prior CABG. Visualized tracheal air column is within normal limits. No acute osseous abnormality identified. Negative visible bowel gas pattern. IMPRESSION: Acute interstitial edema with progressed bilateral pleural effusions since last month, moderate on the left. Electronically Signed   By: Genevie Ann M.D.   On: 02/28/2020 22:21     Medications:   . sodium chloride    .  furosemide (LASIX) infusion 5 mg/hr (03/01/20 0253)   . apixaban  5 mg Oral BID  . aspirin EC  81 mg Oral Daily  . carvedilol  6.25 mg Oral BID WC  . feeding supplement (GLUCERNA SHAKE)  237 mL Oral BID BM  . insulin aspart  0-15 Units Subcutaneous TID WC  . sodium chloride flush  3 mL Intravenous Once  . sodium chloride flush  3 mL Intravenous Q12H   sodium chloride, acetaminophen, meclizine, nitroGLYCERIN, ondansetron (ZOFRAN) IV, oxyCODONE, sodium chloride flush  Assessment/ Plan:  74  y.o. male with COPD, insulin dependent diabetes mellitus type 2, coronary artery disease, hypertension, thrombocytopenia, who was admitted with nausea and vomiting and also found to have dyspnea with exertion and acute on chronic systolic and diastolic heart failure.  Patient also found to have acute kidney injury.  1.  Acute kidney injury/chronic kidney disease stage IIIb/diabetes mellitus type 2 with chronic kidney disease.  Recently his baseline creatinine has been degraded.  In addition he has significant generalized edema and pleural effusions as well as pulmonary edema.   -Renal function improved a bit.  Creatinine down to 2.4.  Patient had excellent diuretic response.  Patient had urine output of 3.4 L yesterday.  Continue Lasix drip for 1 additional day.  2.  Acute on chronic systolic heart failure exacerbation.  Continue furosemide drip 5 mg/h.  Monitor serum electrolytes.  3.  Hypertension.  Continue carvedilol.  Mathilde Mcwherter 7/15/20216:24 PM

## 2020-03-01 NOTE — Evaluation (Signed)
Physical Therapy Evaluation Patient Details Name: Rick Zuniga MRN: [id number] DOB: January 30, 1946 Today's Date: 03/01/2020   History of Present Illness  Pt is a 74 y.o. male with PMH that includes DM II, hypertension, CAD status post CABG, COPD, OA, and DVT on Eliquis.  MD assessment includes: Acute on chronic combined systolic and diastolic congestive heart failure, AKI on CKD III, hyperkalemia, LLE DVT on eliquis, HTN, and DM II.   Clinical Impression  Pt pleasant and motivated to participate during the session.  Pt found on 2LO2/min with SpO2 99-100%.  Nursing notified and agreed to trial session on room air.  Pt performed very well during the session without the need of physical assistance with any functional task.  Pt taken through graded activity progression on room air with SpO2 remaining >/= 92% throughout and mostly in the mid to upper 90s.  Pt reported feeling at his baseline physically with no desire for continued PT services.  Pt's SpO2 noted to be in the upper 90s at the end of the session with the pt left on room air, nursing notified.  Will complete PT orders at this time but will reassess pt pending a change in status upon receipt of new PT orders.      Follow Up Recommendations No PT follow up    Equipment Recommendations  None recommended by PT    Recommendations for Other Services       Precautions / Restrictions Precautions Precautions: Fall Restrictions Weight Bearing Restrictions: No      Mobility  Bed Mobility Overal bed mobility: Independent                Transfers Overall transfer level: Independent Equipment used: None             General transfer comment: Good eccentric and concentric control  Ambulation/Gait Ambulation/Gait assistance: Modified independent (Device/Increase time) Gait Distance (Feet): 75 Feet x 1, 150 Feet x 1 Assistive device: Rolling walker (2 wheeled);None Gait Pattern/deviations: WFL(Within Functional  Limits) Gait velocity: WFL   General Gait Details: Pt steady with amb with a RW and with limited amb in the room without an AD  Stairs            Wheelchair Mobility    Modified Rankin (Stroke Patients Only)       Balance Overall balance assessment: No apparent balance deficits (not formally assessed)                                           Pertinent Vitals/Pain Pain Assessment: No/denies pain    Home Living Family/patient expects to be discharged to:: Private residence Living Arrangements: Spouse/significant other Available Help at Discharge: Family;Available 24 hours/day Type of Home: House Home Access: Stairs to enter Entrance Stairs-Rails: Left Entrance Stairs-Number of Steps: 4 Home Layout: One level Home Equipment: Walker - 2 wheels      Prior Function Level of Independence: Independent         Comments: Ind Amb without an AD with only occasional RW use PRN, community ambulator, no fall history, Ind with ADLs     Hand Dominance        Extremity/Trunk Assessment   Upper Extremity Assessment Upper Extremity Assessment: Overall WFL for tasks assessed    Lower Extremity Assessment Lower Extremity Assessment: Overall WFL for tasks assessed       Communication   Communication: No  difficulties  Cognition Arousal/Alertness: Awake/alert Behavior During Therapy: WFL for tasks assessed/performed Overall Cognitive Status: Within Functional Limits for tasks assessed                                        General Comments      Exercises Other Exercises Other Exercises: Pt education provided on dyspnea spiral and principles of activity progression and energy conservation related to COPD   Assessment/Plan    PT Assessment Patent does not need any further PT services  PT Problem List         PT Treatment Interventions      PT Goals (Current goals can be found in the Care Plan section)  Acute Rehab PT  Goals PT Goal Formulation: All assessment and education complete, DC therapy    Frequency     Barriers to discharge        Co-evaluation               AM-PAC PT "6 Clicks" Mobility  Outcome Measure Help needed turning from your back to your side while in a flat bed without using bedrails?: None Help needed moving from lying on your back to sitting on the side of a flat bed without using bedrails?: None Help needed moving to and from a bed to a chair (including a wheelchair)?: None Help needed standing up from a chair using your arms (e.g., wheelchair or bedside chair)?: None Help needed to walk in hospital room?: None Help needed climbing 3-5 steps with a railing? : None 6 Click Score: 24    End of Session Equipment Utilized During Treatment: Gait belt Activity Tolerance: Patient tolerated treatment well Patient left: in chair;with call bell/phone within reach;with chair alarm set;with family/visitor present;with SCD's reapplied Nurse Communication: Mobility status;Other (comment) (Pt left in room on room air with SpO2 WNL) PT Visit Diagnosis: Muscle weakness (generalized) (M62.81)    Time: 5456-2563 PT Time Calculation (min) (ACUTE ONLY): 44 min   Charges:   PT Evaluation $PT Eval Low Complexity: 1 Low PT Treatments $Therapeutic Activity: 8-22 mins        D. Royetta Asal PT, DPT 03/01/20, 4:17 PM

## 2020-03-01 NOTE — Progress Notes (Signed)
PROGRESS NOTE    Rick Zuniga  KWI:[id number] DOB: 05-11-1946 DOA: 02/28/2020 PCP: [name], MD   Chief complaint.  Shortness of breath.  Brief Narrative:  Rick Lope Stricklandis a 74 y.o.malewith medical history significant oftype 2 diabetes mellitus, hypertension,CAD status post CABG, COPD and osteoarthritis,DVT on Eliquis with c/o Nausea and vomiting,He feels weak and malaised but has not had any cough, chest pain, shortness of breath, dysuria, or hematuria.Pt also has sob that has been getting worse progressively.Pt reports that he has been told his heart function has decreased. Today he woke up with nausea and has not been feeling well.  Wife at bedside and is concerned he is very weak and cant ambulate and take care of himself.  Patient is diagnosed with acute on chronic systolic congestive heart failure, he was placed on IV Lasix.  Echocardiogram showed ejection fraction 40 to 45%.  7/14.   Lasix was switched to IV drip per nephrology.  Received Kayexalate for hyperkalemia.     Assessment & Plan:   Active Problems:   Acute kidney injury superimposed on CKD (HCC)   SOB (shortness of breath)   Acute on chronic diastolic CHF (congestive heart failure) ([facility])  #1.  Acute on chronic combined systolic and diastolic congestive heart failure. Condition gradually improving, patient is diuresed well.  Renal function is getting better on Lasix drip.  Continue current treatment, appreciate nephrology consult.  2.  Acute kidney injury on chronic kidney disease stage IIIb with hyperkalemia. Renal function improving, Potassium level normalized.  3.  History of a left lower extremity DVT. Continue Eliquis  4.  Essential hypertension. Continue Coreg and Imdur  5.  Type 2 diabetes.  Continue sliding scale insulin.   DVT prophylaxis: Eliquis Code Status: Full Family Communication: Wife in the room. Disposition Plan:   Patient came from: Home                                                                                                                            Anticipated d/c place: Home  Barriers to d/c OR conditions which need to be met to effect a safe d/c:   Consultants:   Nephrology  Procedures: None Antimicrobials:  None   Subjective: Patient slept through the night without any short of breath.  Was able to walk in the bathroom.  No cough. No abdominal pain no nausea vomiting. No fever chills.  Objective: Vitals:   03/01/20 0200 03/01/20 0327 03/01/20 0500 03/01/20 0800  BP: 123/76 124/68  127/69  Pulse: 72 76  81  Resp:  18  14  Temp: 98.8 F (37.1 C) 98.2 F (36.8 C)  98.7 F (37.1 C)  TempSrc: Oral Oral    SpO2: 100% 96%  99%  Weight:   85 kg   Height:        Intake/Output Summary (Last 24 hours) at 03/01/2020 0902 Last data filed at 03/01/2020 0828 Gross per 24 hour  Intake 252.[place]  ml  Output 4500 ml  Net -4247.38 ml   Filed Weights   02/28/20 1709 02/29/20 0500 03/01/20 0500  Weight: 81.6 kg 85.5 kg 85 kg    Examination:  General exam: Appears calm and comfortable  Respiratory system: Decreased breathing sounds without wheezes or crackles. Respiratory effort normal. Cardiovascular system: S1 & S2 heard, RRR. No JVD, murmurs, rubs, gallops or clicks. 1+ pedal edema. Gastrointestinal system: Abdomen is nondistended, soft and nontender. No organomegaly or masses felt. Normal bowel sounds heard. Central nervous system: Alert and oriented. No focal neurological deficits. Extremities: Symmetric 5 x 5 power. Skin: No rashes, lesions or ulcers Psychiatry: Judgement and insight appear normal. Mood & affect appropriate.     Data Reviewed: I have personally reviewed following labs and imaging studies  CBC: Recent Labs  Lab 02/28/20 1711 03/01/20 0509  WBC 11.9* 5.0  NEUTROABS  --  3.6  HGB 9.5* 8.3*  HCT 26.7* 24.6*  MCV 90.5 93.5  PLT 198 767*   Basic Metabolic Panel: Recent Labs  Lab  02/28/20 1711 02/28/20 2035 02/29/20 0428 02/29/20 1356 03/01/20 0509  NA 137  --  137  --  141  K 5.4*  --  5.6* 4.1 3.8  CL 101  --  101  --  100  CO2 24  --  23  --  29  GLUCOSE 321*  --  320*  --  148*  BUN 59*  --  67*  --  67*  CREATININE 2.50*  --  2.73*  --  2.49*  CALCIUM 8.9  --  8.6*  --  9.0  MG  --  2.2  --   --   --    GFR: Estimated Creatinine Clearance: 29 mL/min (A) (by C-G formula based on SCr of 2.49 mg/dL (H)). Liver Function Tests: Recent Labs  Lab 02/28/20 1711  AST 18  ALT 14  ALKPHOS 64  BILITOT 1.9*  PROT 7.5  ALBUMIN 3.8   Recent Labs  Lab 02/28/20 1711  LIPASE 17   No results for input(s): AMMONIA in the last 168 hours. Coagulation Profile: No results for input(s): INR, PROTIME in the last 168 hours. Cardiac Enzymes: No results for input(s): CKTOTAL, CKMB, CKMBINDEX, TROPONINI in the last 168 hours. BNP (last 3 results) No results for input(s): PROBNP in the last 8760 hours. HbA1C: No results for input(s): HGBA1C in the last 72 hours. CBG: Recent Labs  Lab 02/29/20 1126 02/29/20 1604 02/29/20 1654 02/29/20 2040 03/01/20 0758  GLUCAP 181* 67* 95 155* 160*   Lipid Profile: No results for input(s): CHOL, HDL, LDLCALC, TRIG, CHOLHDL, LDLDIRECT in the last 72 hours. Thyroid Function Tests: No results for input(s): TSH, T4TOTAL, FREET4, T3FREE, THYROIDAB in the last 72 hours. Anemia Panel: No results for input(s): VITAMINB12, FOLATE, FERRITIN, TIBC, IRON, RETICCTPCT in the last 72 hours. Sepsis Labs: No results for input(s): PROCALCITON, LATICACIDVEN in the last 168 hours.  Recent Results (from the past 240 hour(s))  SARS Coronavirus 2 by RT PCR (hospital order, performed in [facility] hospital lab) Nasopharyngeal Nasopharyngeal Swab     Status: None   Collection Time: 02/29/20 12:29 AM   Specimen: Nasopharyngeal Swab  Result Value Ref Range Status   SARS Coronavirus 2 NEGATIVE NEGATIVE Final    Comment: (NOTE) SARS-CoV-2  target nucleic acids are NOT DETECTED.  The SARS-CoV-2 RNA is generally detectable in upper and lower respiratory specimens during the acute phase of infection. The lowest concentration of SARS-CoV-2 viral copies this assay  can detect is 250 copies / mL. A negative result does not preclude SARS-CoV-2 infection and should not be used as the sole basis for treatment or other patient management decisions.  A negative result may occur with improper specimen collection / handling, submission of specimen other than nasopharyngeal swab, presence of viral mutation(s) within the areas targeted by this assay, and inadequate number of viral copies (<250 copies / mL). A negative result must be combined with clinical observations, patient history, and epidemiological information.  Fact Sheet for Patients:   StrictlyIdeas.no  Fact Sheet for Healthcare Providers: BankingDealers.co.za  This test is not yet approved or  cleared by the [place] FDA and has been authorized for detection and/or diagnosis of SARS-CoV-2 by FDA under an Emergency Use Authorization (EUA).  This EUA will remain in effect (meaning this test can be used) for the duration of the COVID-19 declaration under Section 564(b)(1) of the Act, 21 U.S.C. section 360bbb-3(b)(1), unless the authorization is terminated or revoked sooner.  Performed at [facility], [address] [place], [place] [address]          Radiology Studies: CT Abdomen Pelvis Wo Contrast  Result Date: 02/28/2020 CLINICAL DATA:  Abdominal pain with nausea and vomiting EXAM: CT ABDOMEN AND PELVIS WITHOUT CONTRAST TECHNIQUE: Multidetector CT imaging of the abdomen and pelvis was performed following the standard protocol without IV contrast. COMPARISON:  None. FINDINGS: Lower chest: There is new moderate cardiomegaly present. A moderate left and small right pleural effusion is present. Probable  adjacent basilar atelectasis seen. Hepatobiliary: Although limited due to the lack of intravenous contrast, normal in appearance without gross focal abnormality. The patient is status post cholecystectomy. No biliary ductal dilation. Pancreas:  Unremarkable.  No surrounding inflammatory changes. Spleen: Normal in size. Although limited due to the lack of intravenous contrast, normal in appearance. Adrenals/Urinary Tract: Both adrenal glands appear normal. The kidneys and collecting system appear normal without evidence of urinary tract calculus or hydronephrosis. Bladder is unremarkable. Stomach/Bowel: The stomach, small bowel, and colon are normal in appearance. No inflammatory changes or obstructive findings. Scattered colonic diverticula are noted without diverticulitis. Vascular/Lymphatic: There are no enlarged abdominal or pelvic lymph nodes. Scattered aortic atherosclerotic calcifications are seen without aneurysmal dilatation. Reproductive: The prostate is unremarkable. Other: No evidence of abdominal wall mass or hernia. There is diffuse anasarca seen. Musculoskeletal: No acute or significant osseous findings. IMPRESSION: Moderate cardiomegaly Moderate left and small right pleural effusion with adjacent basilar atelectasis Diverticulosis without diverticulitis. Diffuse anasarca Aortic Atherosclerosis (ICD10-I70.0). Electronically Signed   By: Prudencio Pair M.D.   On: 02/28/2020 21:28   DG Chest 2 View  Result Date: 02/28/2020 CLINICAL DATA:  74 year old male with left greater than right pleural effusions. EXAM: CHEST - 2 VIEW COMPARISON:  Chest radiograph 02/13/2020 and earlier. FINDINGS: Seated AP lateral views. Bilateral pleural effusions, moderate on the left and small to moderate on the right. Associated dense lung base opacification, with worsening ventilation at the right lung base from last month. Superimposed increased bilateral pulmonary vascular congestion. Stable cardiac size and mediastinal  contours. Prior CABG. Visualized tracheal air column is within normal limits. No acute osseous abnormality identified. Negative visible bowel gas pattern. IMPRESSION: Acute interstitial edema with progressed bilateral pleural effusions since last month, moderate on the left. Electronically Signed   By: Genevie Ann M.D.   On: 02/28/2020 22:21        Scheduled Meds: . apixaban  5 mg Oral BID  . aspirin EC  81 mg Oral Daily  . carvedilol  6.25 mg Oral BID WC  . feeding supplement (GLUCERNA SHAKE)  237 mL Oral BID BM  . insulin aspart  0-15 Units Subcutaneous TID WC  . patiromer  8.4 g Oral Daily  . sodium chloride flush  3 mL Intravenous Once  . sodium chloride flush  3 mL Intravenous Q12H   Continuous Infusions: . sodium chloride    . furosemide (LASIX) infusion 5 mg/hr (03/01/20 0253)     LOS: 2 days    Time spent: 28 minutes    Sharen Hones, MD [facility]   To contact the attending provider between 7A-7P or the covering provider during after hours 7P-7A, please log into the web site [email or web address] and access using universal [place] password for that web site. If you do not have the password, please call the hospital operator.  03/01/2020, 9:02 AM

## 2020-03-02 DIAGNOSIS — I5043 Acute on chronic combined systolic (congestive) and diastolic (congestive) heart failure: Secondary | ICD-10-CM

## 2020-03-02 DIAGNOSIS — N189 Chronic kidney disease, unspecified: Secondary | ICD-10-CM | POA: Diagnosis not present

## 2020-03-02 DIAGNOSIS — N179 Acute kidney failure, unspecified: Secondary | ICD-10-CM | POA: Diagnosis not present

## 2020-03-02 LAB — CBC WITH DIFFERENTIAL/PLATELET
Abs Immature Granulocytes: 0.02 10*3/uL (ref 0.00–0.07)
Basophils Absolute: 0 10*3/uL (ref 0.0–0.1)
Basophils Relative: 0 %
Eosinophils Absolute: 0.1 10*3/uL (ref 0.0–0.5)
Eosinophils Relative: 3 %
HCT: 26.6 % — ABNORMAL LOW (ref 39.0–52.0)
Hemoglobin: 9.3 g/dL — ABNORMAL LOW (ref 13.0–17.0)
Immature Granulocytes: 0 %
Lymphocytes Relative: 21 %
Lymphs Abs: 1 10*3/uL (ref 0.7–4.0)
MCH: 31.8 pg (ref 26.0–34.0)
MCHC: 35 g/dL (ref 30.0–36.0)
MCV: 91.1 fL (ref 80.0–100.0)
Monocytes Absolute: 0.5 10*3/uL (ref 0.1–1.0)
Monocytes Relative: 10 %
Neutro Abs: 3.1 10*3/uL (ref 1.7–7.7)
Neutrophils Relative %: 66 %
Platelets: 135 10*3/uL — ABNORMAL LOW (ref 150–400)
RBC: 2.92 MIL/uL — ABNORMAL LOW (ref 4.22–5.81)
RDW: 13.2 % (ref 11.5–15.5)
WBC: 4.7 10*3/uL (ref 4.0–10.5)
nRBC: 0 % (ref 0.0–0.2)

## 2020-03-02 LAB — BASIC METABOLIC PANEL
Anion gap: 11 (ref 5–15)
BUN: 61 mg/dL — ABNORMAL HIGH (ref 8–23)
CO2: 32 mmol/L (ref 22–32)
Calcium: 9.1 mg/dL (ref 8.9–10.3)
Chloride: 97 mmol/L — ABNORMAL LOW (ref 98–111)
Creatinine, Ser: 2.16 mg/dL — ABNORMAL HIGH (ref 0.61–1.24)
GFR calc Af Amer: 33 mL/min — ABNORMAL LOW (ref >60)
GFR calc non Af Amer: 29 mL/min — ABNORMAL LOW (ref >60)
Glucose, Bld: 229 mg/dL — ABNORMAL HIGH (ref 70–99)
Potassium: 3.3 mmol/L — ABNORMAL LOW (ref 3.5–5.1)
Sodium: 140 mmol/L (ref 135–145)

## 2020-03-02 LAB — GLUCOSE, CAPILLARY
Glucose-Capillary: 204 mg/dL — ABNORMAL HIGH (ref 70–99)
Glucose-Capillary: 265 mg/dL — ABNORMAL HIGH (ref 70–99)

## 2020-03-02 LAB — MAGNESIUM: Magnesium: 1.8 mg/dL (ref 1.7–2.4)

## 2020-03-02 MED ORDER — POTASSIUM CHLORIDE 20 MEQ PO PACK
40.0000 meq | PACK | Freq: Two times a day (BID) | ORAL | Status: DC
  Administered 2020-03-02: 40 meq via ORAL
  Filled 2020-03-02: qty 2

## 2020-03-02 MED ORDER — MECLIZINE HCL 25 MG PO TABS: 25.0000 mg | ORAL_TABLET | Freq: Three times a day (TID) | ORAL | 0 refills | Status: AC | PRN

## 2020-03-02 NOTE — Discharge Summary (Signed)
Physician Discharge Summary  Patient ID: Rick Zuniga MRN: [id number] DOB/AGE: 75-Jan-1947 75 y.o.  Admit date: 02/28/2020 Discharge date: 03/02/2020  Admission Diagnoses:  Discharge Diagnoses:  Active Problems:   Acute kidney injury superimposed on CKD (HCC)   SOB (shortness of breath)   Acute on chronic combined systolic and diastolic CHF (congestive heart failure) [facility])   Discharged Condition: good  Hospital Course:  Rick Catanzaro Stricklandis a 74 y.o.malewith medical history significant oftype 2 diabetes mellitus, hypertension,CAD status post CABG, COPD and osteoarthritis,DVT on Eliquis with c/o Nausea and vomiting,He feels weak and malaised but has not had any cough, chest pain, shortness of breath, dysuria, or hematuria.Pt also has sob that has been getting worse progressively.Pt reports that he has been told his heart function has decreased. Today he woke up with nausea and has not been feeling well.  Wife at bedside and is concerned he is very weak and cant ambulate and take care of himself. Patient is diagnosed with acute on chronicsystoliccongestive heart failure, he was placed on IV Lasix. Echocardiogram showed ejection fraction 40 to45%.  7/14.  Lasix was switched to IV drip per nephrology.  Received Kayexalate for hyperkalemia.  7/15.  Patient condition much improved on Lasix drip.  Developed vertigo which is chronic in nature, given meclizine.  7/16.  Patient was off oxygen, short of breath resolved.  Leg edema also resolved.  Medically stable to be discharged.  #1.  Acute on chronic combined systolic and diastolic congestive heart failure. Patient initially received IV Lasix twice a day, renal function worsened afterwards.  Switch to Lasix drip, patient diuresed well with improved renal function.   2.  Acute kidney injury on chronic kidney disease stage IIIb with hyperkalemia. Renal function improving, Potassium level normalized.  Patient be followed by  nephrology in a few weeks.  3.  History of a left lower extremity DVT. Continue Eliquis  4.  Essential hypertension. Continue Coreg and Imdur  5.  Type 2 diabetes.    Resume home medicines.  6.  Vertigo. Condition improved after giving meclizine.  7.  Bilateral pleural effusion secondary to congestive heart failure. Condition improved after diuretics based on physical examination.    Consults: nephrology  Significant Diagnostic Studies:  CT ABDOMEN AND PELVIS WITHOUT CONTRAST  TECHNIQUE: Multidetector CT imaging of the abdomen and pelvis was performed following the standard protocol without IV contrast.  COMPARISON:  None.  FINDINGS: Lower chest: There is new moderate cardiomegaly present. A moderate left and small right pleural effusion is present. Probable adjacent basilar atelectasis seen.  Hepatobiliary: Although limited due to the lack of intravenous contrast, normal in appearance without gross focal abnormality. The patient is status post cholecystectomy. No biliary ductal dilation.  Pancreas:  Unremarkable.  No surrounding inflammatory changes.  Spleen: Normal in size. Although limited due to the lack of intravenous contrast, normal in appearance.  Adrenals/Urinary Tract: Both adrenal glands appear normal. The kidneys and collecting system appear normal without evidence of urinary tract calculus or hydronephrosis. Bladder is unremarkable.  Stomach/Bowel: The stomach, small bowel, and colon are normal in appearance. No inflammatory changes or obstructive findings. Scattered colonic diverticula are noted without diverticulitis.  Vascular/Lymphatic: There are no enlarged abdominal or pelvic lymph nodes. Scattered aortic atherosclerotic calcifications are seen without aneurysmal dilatation.  Reproductive: The prostate is unremarkable.  Other: No evidence of abdominal wall mass or hernia. There is diffuse anasarca seen.  Musculoskeletal: No  acute or significant osseous findings.  IMPRESSION: Moderate cardiomegaly  Moderate left  and small right pleural effusion with adjacent basilar atelectasis  Diverticulosis without diverticulitis.  Diffuse anasarca  Aortic Atherosclerosis (ICD10-I70.0).   Electronically Signed   By: Prudencio Pair M.D.   On: 02/28/2020 21:28  CHEST - 2 VIEW  COMPARISON:  Chest radiograph 02/13/2020 and earlier.  FINDINGS: Seated AP lateral views. Bilateral pleural effusions, moderate on the left and small to moderate on the right. Associated dense lung base opacification, with worsening ventilation at the right lung base from last month. Superimposed increased bilateral pulmonary vascular congestion. Stable cardiac size and mediastinal contours. Prior CABG. Visualized tracheal air column is within normal limits. No acute osseous abnormality identified. Negative visible bowel gas pattern.  IMPRESSION: Acute interstitial edema with progressed bilateral pleural effusions since last month, moderate on the left.   Electronically Signed   By: Genevie Ann M.D.   On: 02/28/2020 22:21    Treatments: IV Lasix drip.  Discharge Exam: Blood pressure 132/78, pulse 84, temperature 98.2 F (36.8 C), temperature source Oral, resp. rate 20, height 6\' 1"  (1.854 m), weight 85 kg, SpO2 96 %. General appearance: alert, cooperative and Oriented to time place and person. Resp: Decreased breathing sounds on the left lower field, no crackles or wheezes. Cardio: regular rate and rhythm, S1, S2 normal, no murmur, click, rub or gallop GI: soft, non-tender; bowel sounds normal; no masses,  no organomegaly Extremities: venous stasis dermatitis noted and Leg edema essentially resolved.  Disposition: Discharge disposition: 01-Home or Self Care       Discharge Instructions    Amb Referral to HF Clinic   Complete by: As directed    Diet - low sodium heart healthy   Complete by: As directed     Increase activity slowly   Complete by: As directed      Allergies as of 03/02/2020      Reactions   Pioglitazone Swelling   Metformin And Related Diarrhea   Other    Pt reported that he is allergic to Ace wrap.  Reaction include severe skin irritation.   Atorvastatin Rash   Benazepril Rash   Latex Rash   (tape only)   Tape Rash      Medication List    TAKE these medications   amLODipine 10 MG tablet Commonly known as: NORVASC Take 1 tablet (10 mg total) by mouth daily.   apixaban 5 MG Tabs tablet Commonly known as: ELIQUIS Take 1 tablet (5 mg total) by mouth 2 (two) times daily.   aspirin EC 81 MG tablet Take 81 mg by mouth daily.   carvedilol 6.25 MG tablet Commonly known as: COREG Take 6.25 mg by mouth 2 (two) times daily with a meal.   feeding supplement (ENSURE ENLIVE) Liqd Take 237 mLs by mouth 2 (two) times daily between meals.   ferrous sulfate 325 (65 FE) MG tablet Take 1 tablet (325 mg total) by mouth 2 (two) times daily with a meal.   glipiZIDE 10 MG tablet Commonly known as: GLUCOTROL Take 10 mg by mouth 2 (two) times daily.   insulin NPH-regular Human (70-30) 100 UNIT/ML injection Inject 10 Units into the skin 2 (two) times daily.   isosorbide mononitrate 30 MG 24 hr tablet Commonly known as: IMDUR Take 30 mg by mouth daily.   meclizine 25 MG tablet Commonly known as: ANTIVERT Take 1 tablet (25 mg total) by mouth 3 (three) times daily as needed for dizziness.   nitroGLYCERIN 0.4 MG SL tablet Commonly known as: NITROSTAT Place 0.4 mg under  the tongue every 5 (five) minutes as needed for chest pain.   oxyCODONE 5 MG immediate release tablet Commonly known as: Oxy IR/ROXICODONE Take 5 mg by mouth every 4 (four) hours as needed.   torsemide 20 MG tablet Commonly known as: DEMADEX Take 20 mg by mouth 2 (two) times daily.       Follow-up Information    [facility] Follow up on 03/20/2020.    Specialty: Cardiology Why: at 11:30am. Enter through the [place] entrance Contact information: [place] [place]       [name], MD Follow up in 1 week(s).   Specialty: Internal Medicine Contact information: [place] [address] [place], [place], [place] Follow up in 3 week(s).   Specialty: Nephrology Contact information: [place] [address] [phone]              35 minutes Signed: Sharen Hones 03/02/2020, 8:43 AM

## 2020-03-02 NOTE — Progress Notes (Signed)
[facility]  ROUNDING NOTE   Subjective:  Patient continues to have excellent diuretic response. Urine output yesterday again was 3.4 L. Creatinine also down to 2.16 with a BUN down to 61.  Objective:  Vital signs in last 24 hours:  Temp:  [98 F (36.7 C)-98.7 F (37.1 C)] 98.2 F (36.8 C) (07/16 0747) Pulse Rate:  [72-84] 84 (07/16 0747) Resp:  [16-20] 20 (07/16 0747) BP: (114-132)/(62-78) 132/78 (07/16 0747) SpO2:  [95 %-100 %] 96 % (07/16 0747)  Weight change:  Filed Weights   02/28/20 1709 02/29/20 0500 03/01/20 0500  Weight: 81.6 kg 85.5 kg 85 kg    Intake/Output: I/O last 3 completed shifts: In: -  Out: 5650 [Urine:5650]   Intake/Output this shift:  No intake/output data recorded.  Physical Exam: General: No acute distress  Head: Normocephalic, atraumatic. Moist oral mucosal membranes  Eyes: Anicteric  Neck: Supple, trachea midline  Lungs:  Clear bilateral, normal effort  Heart: S1S2 no rubs  Abdomen:  Soft, nontender, bowel sounds present  Extremities: Trace peripheral edema.  Neurologic: Awake, alert, following commands  Skin: No lesions       Basic Metabolic Panel: Recent Labs  Lab 02/28/20 1711 02/28/20 1711 02/28/20 2035 02/29/20 0428 02/29/20 1356 03/01/20 0509 03/02/20 0508  NA 137  --   --  137  --  141 140  K 5.4*  --   --  5.6* 4.1 3.8 3.3*  CL 101  --   --  101  --  100 97*  CO2 24  --   --  23  --  29 32  GLUCOSE 321*  --   --  320*  --  148* 229*  BUN 59*  --   --  67*  --  67* 61*  CREATININE 2.50*  --   --  2.73*  --  2.49* 2.16*  CALCIUM 8.9   < >  --  8.6*  --  9.0 9.1  MG  --   --  2.2  --   --   --  1.8   < > = values in this interval not displayed.    Liver Function Tests: Recent Labs  Lab 02/28/20 1711  AST 18  ALT 14  ALKPHOS 64  BILITOT 1.9*  PROT 7.5  ALBUMIN 3.8   Recent Labs  Lab 02/28/20 1711  LIPASE 17   No results for input(s): AMMONIA in the last 168 hours.  CBC: Recent Labs  Lab  02/28/20 1711 03/01/20 0509 03/02/20 0508  WBC 11.9* 5.0 4.7  NEUTROABS  --  3.6 3.1  HGB 9.5* 8.3* 9.3*  HCT 26.7* 24.6* 26.6*  MCV 90.5 93.5 91.1  PLT 198 125* 135*    Cardiac Enzymes: No results for input(s): CKTOTAL, CKMB, CKMBINDEX, TROPONINI in the last 168 hours.  BNP: Invalid input(s): POCBNP  CBG: Recent Labs  Lab 03/01/20 0758 03/01/20 1138 03/01/20 1618 03/01/20 2026 03/02/20 0736  GLUCAP 160* 206* 160* 238* 204*    Microbiology: Results for orders placed or performed during the hospital encounter of 02/28/20  SARS Coronavirus 2 by RT PCR (hospital order, performed in [facility] hospital lab) Nasopharyngeal Nasopharyngeal Swab     Status: None   Collection Time: 02/29/20 12:29 AM   Specimen: Nasopharyngeal Swab  Result Value Ref Range Status   SARS Coronavirus 2 NEGATIVE NEGATIVE Final    Comment: (NOTE) SARS-CoV-2 target nucleic acids are NOT DETECTED.  The SARS-CoV-2 RNA is generally detectable in upper and  lower respiratory specimens during the acute phase of infection. The lowest concentration of SARS-CoV-2 viral copies this assay can detect is 250 copies / mL. A negative result does not preclude SARS-CoV-2 infection and should not be used as the sole basis for treatment or other patient management decisions.  A negative result may occur with improper specimen collection / handling, submission of specimen other than nasopharyngeal swab, presence of viral mutation(s) within the areas targeted by this assay, and inadequate number of viral copies (<250 copies / mL). A negative result must be combined with clinical observations, patient history, and epidemiological information.  Fact Sheet for Patients:   StrictlyIdeas.no  Fact Sheet for Healthcare Providers: BankingDealers.co.za  This test is not yet approved or  cleared by the [place] FDA and has been authorized for detection and/or diagnosis  of SARS-CoV-2 by FDA under an Emergency Use Authorization (EUA).  This EUA will remain in effect (meaning this test can be used) for the duration of the COVID-19 declaration under Section 564(b)(1) of the Act, 21 U.S.C. section 360bbb-3(b)(1), unless the authorization is terminated or revoked sooner.  Performed at [facility], [address] [place], [place] [address]     Coagulation Studies: No results for input(s): LABPROT, INR in the last 72 hours.  Urinalysis: No results for input(s): COLORURINE, LABSPEC, PHURINE, GLUCOSEU, HGBUR, BILIRUBINUR, KETONESUR, PROTEINUR, UROBILINOGEN, NITRITE, LEUKOCYTESUR in the last 72 hours.  Invalid input(s): APPERANCEUR    Imaging: No results found.   Medications:   . sodium chloride    . furosemide (LASIX) infusion 5 mg/hr (03/01/20 2251)   . apixaban  5 mg Oral BID  . aspirin EC  81 mg Oral Daily  . carvedilol  6.25 mg Oral BID WC  . feeding supplement (GLUCERNA SHAKE)  237 mL Oral BID BM  . insulin aspart  0-15 Units Subcutaneous TID WC  . potassium chloride  40 mEq Oral BID  . sodium chloride flush  3 mL Intravenous Once  . sodium chloride flush  3 mL Intravenous Q12H   sodium chloride, acetaminophen, meclizine, nitroGLYCERIN, ondansetron (ZOFRAN) IV, oxyCODONE, sodium chloride flush  Assessment/ Plan:  74 y.o. male with COPD, insulin dependent diabetes mellitus type 2, coronary artery disease, hypertension, thrombocytopenia, who was admitted with nausea and vomiting and also found to have dyspnea with exertion and acute on chronic systolic and diastolic heart failure.  Patient also found to have acute kidney injury.  1.  Acute kidney injury/chronic kidney disease stage IIIb/diabetes mellitus type 2 with chronic kidney disease.  Recently his baseline creatinine has been degraded.  In addition he has significant generalized edema and pleural effusions as well as pulmonary edema.   -Renal function continues to improve.   BUN down to 61 with a creatinine 2.16.  Patient had excellent diuretic response.  Okay to stop Lasix drip upon discharge.  He will need outpatient follow-up for his underlying acute kidney injury and chronic kidney disease next week in our office.  This has been scheduled.  2.  Acute on chronic systolic heart failure exacerbation.  Okay to stop Lasix drip upon discharge.  3.  Hypertension.  Continue carvedilol.  4.  Hypokalemia.  Agree with repletion efforts.  Petrice Beedy 7/16/202111:48 AM

## 2020-03-02 NOTE — Care Management Important Message (Signed)
Important Message  Patient Details  Name: Rick Zuniga MRN: [id number] Date of Birth: 04-21-1946   Medicare Important Message Given:  Yes     Juliann Pulse A Natassia Guthridge 03/02/2020, 10:32 AM

## 2020-03-02 NOTE — TOC Transition Note (Signed)
Transition of Care [facility]) - CM/SW Discharge Note   Patient Details  Name: Rick Zuniga MRN: [id number] Date of Birth: November 23, 1945  [facility] CM/SW Contact:  Shelbie Hutching, RN Phone Number: 03/02/2020, 1:20 PM   Clinical Narrative:    Patient medically cleared for discharge home.  Patient will resume home health services with [facility].     Final next level of care: Home w Home Health Services Barriers to Discharge: Barriers Resolved   Patient Goals and CMS Choice   CMS Medicare.gov Compare Post Acute Care list provided to:: Patient Choice offered to / list presented to : Patient  Discharge Placement                       Discharge Plan and Services   Discharge Planning Services: CM Consult                      HH Arranged: RN, PT [facility] Agency: [place] Date [facility] Agency Contacted: 03/02/20 Time HH Agency Contacted: 0900 Representative spoke with at [place]: [place] ([place]) Interventions     Readmission Risk Interventions No flowsheet data found.

## 2020-03-02 NOTE — Progress Notes (Signed)
Pt for discharge home. A/o. No distress, iv lasix and site d/cd. Instructions discussed with pt meds/ diet activity and f/u. Verbalizes understanding.

## 2020-03-06 ENCOUNTER — Encounter (INDEPENDENT_AMBULATORY_CARE_PROVIDER_SITE_OTHER): Payer: Medicare Other | Admitting: Ophthalmology

## 2020-03-06 ENCOUNTER — Telehealth: Payer: Self-pay | Admitting: Family

## 2020-03-06 NOTE — Telephone Encounter (Signed)
Unable to reach patient regarding his new patient CHF Clinic appointment for 8/3 at 1130am. Called to attempt to confirm appointment and to follow up with patient since recent hospital discharge.   Rick Zuniga, NT

## 2020-03-20 ENCOUNTER — Ambulatory Visit: Payer: Medicare Other | Attending: Family | Admitting: Family

## 2020-03-20 ENCOUNTER — Encounter: Payer: Self-pay | Admitting: Family

## 2020-03-20 ENCOUNTER — Other Ambulatory Visit: Payer: Self-pay

## 2020-03-20 VITALS — BP 141/77 | HR 72 | Resp 16 | Ht 73.0 in | Wt 161.5 lb

## 2020-03-20 DIAGNOSIS — I10 Essential (primary) hypertension: Secondary | ICD-10-CM

## 2020-03-20 DIAGNOSIS — Z7901 Long term (current) use of anticoagulants: Secondary | ICD-10-CM | POA: Diagnosis not present

## 2020-03-20 DIAGNOSIS — E1129 Type 2 diabetes mellitus with other diabetic kidney complication: Secondary | ICD-10-CM

## 2020-03-20 DIAGNOSIS — Z8249 Family history of ischemic heart disease and other diseases of the circulatory system: Secondary | ICD-10-CM | POA: Diagnosis not present

## 2020-03-20 DIAGNOSIS — M1990 Unspecified osteoarthritis, unspecified site: Secondary | ICD-10-CM | POA: Diagnosis not present

## 2020-03-20 DIAGNOSIS — Z794 Long term (current) use of insulin: Secondary | ICD-10-CM | POA: Insufficient documentation

## 2020-03-20 DIAGNOSIS — E1122 Type 2 diabetes mellitus with diabetic chronic kidney disease: Secondary | ICD-10-CM | POA: Diagnosis not present

## 2020-03-20 DIAGNOSIS — E875 Hyperkalemia: Secondary | ICD-10-CM | POA: Insufficient documentation

## 2020-03-20 DIAGNOSIS — Z79899 Other long term (current) drug therapy: Secondary | ICD-10-CM | POA: Diagnosis not present

## 2020-03-20 DIAGNOSIS — I509 Heart failure, unspecified: Secondary | ICD-10-CM | POA: Diagnosis not present

## 2020-03-20 DIAGNOSIS — I130 Hypertensive heart and chronic kidney disease with heart failure and stage 1 through stage 4 chronic kidney disease, or unspecified chronic kidney disease: Secondary | ICD-10-CM | POA: Insufficient documentation

## 2020-03-20 DIAGNOSIS — Z7982 Long term (current) use of aspirin: Secondary | ICD-10-CM | POA: Diagnosis not present

## 2020-03-20 DIAGNOSIS — J449 Chronic obstructive pulmonary disease, unspecified: Secondary | ICD-10-CM | POA: Insufficient documentation

## 2020-03-20 DIAGNOSIS — N189 Chronic kidney disease, unspecified: Secondary | ICD-10-CM | POA: Diagnosis not present

## 2020-03-20 DIAGNOSIS — I5022 Chronic systolic (congestive) heart failure: Secondary | ICD-10-CM

## 2020-03-20 LAB — GLUCOSE, CAPILLARY: Glucose-Capillary: 278 mg/dL — ABNORMAL HIGH (ref 70–99)

## 2020-03-20 NOTE — Progress Notes (Signed)
Patient ID: Rick Zuniga, male    DOB: [birth year], 74 y.o.   MRN: [id number]  HPI  Mr Monette is a 74 y/o male with a history of DM, HTN, CKD, COPD, thrombocytopenia and chronic heart failure.   Echo report from 02/08/20 reviewed and showed an EF of 40-45% along with mild LAE.  Admitted 02/28/20 due to acute on chronic HF along with nausea. Placed on lasix drip by nephrology along with kayexalate for hyperkalemia. Discharged after 3 days.   He presents today for his initial visit with a chief complaint of minimal shortness of breath upon moderate exertion. He describes this as chronic in nature having been present for several years. He has associated fatigue and light-headedness along with this. He denies any difficulty sleeping, abdominal distention, palpitations, pedal edema, chest pain, cough or fatigue.   Past Medical History:  Diagnosis Date   Arthritis    CHF (congestive heart failure) (HCC)    Chronic kidney disease    COPD (chronic obstructive pulmonary disease) (HCC)    Diabetes ([place])    Heart disease    Hypertension    Indigestion    Thrombocytopenia (HCC)    Wears dentures    full upper and lower   Past Surgical History:  Procedure Laterality Date   APPENDECTOMY     CARDIAC SURGERY     bypass   CHOLECYSTECTOMY     COLONOSCOPY  12/23/2010   Family History  Problem Relation Age of Onset   CAD Mother    Kidney failure Father    Diabetes Father    Social History   Tobacco Use   Smoking status: Never Smoker   Smokeless tobacco: Never Used  Substance Use Topics   Alcohol use: No   Allergies  Allergen Reactions   Pioglitazone Swelling   Metformin And Related Diarrhea   Other     Pt reported that he is allergic to Ace wrap.  Reaction include severe skin irritation.   Atorvastatin Rash   Benazepril Rash   Latex Rash    (tape only)   Tape Rash   Prior to Admission medications   Medication Sig Start Date End Date Taking?  Authorizing Provider  amLODipine (NORVASC) 10 MG tablet Take 1 tablet (10 mg total) by mouth daily. 05/26/19 03/20/20 Yes Vaughan Basta, MD  apixaban (ELIQUIS) 5 MG TABS tablet Take 1 tablet (5 mg total) by mouth 2 (two) times daily. 02/16/20  Yes Nicole Kindred A, DO  aspirin EC 81 MG tablet Take 81 mg by mouth daily.    Yes [provider]  carvedilol (COREG) 6.25 MG tablet Take 6.25 mg by mouth 2 (two) times daily with a meal.   Yes [provider]  ferrous sulfate 325 (65 FE) MG tablet Take 1 tablet (325 mg total) by mouth 2 (two) times daily with a meal. 02/15/20  Yes Nicole Kindred A, DO  glipiZIDE (GLUCOTROL) 10 MG tablet Take 10 mg by mouth 2 (two) times daily.    Yes [provider]  insulin NPH-regular Human (NOVOLIN 70/30) (70-30) 100 UNIT/ML injection Inject 10 Units into the skin 2 (two) times daily.    Yes [provider]  isosorbide mononitrate (IMDUR) 30 MG 24 hr tablet Take 30 mg by mouth daily.  08/19/19  Yes [provider]  meclizine (ANTIVERT) 25 MG tablet Take 1 tablet (25 mg total) by mouth 3 (three) times daily as needed for dizziness. 03/02/20  Yes Sharen Hones, MD  nitroGLYCERIN (NITROSTAT) 0.4  MG SL tablet Place 0.4 mg under the tongue every 5 (five) minutes as needed for chest pain.   Yes [provider]  torsemide (DEMADEX) 20 MG tablet Take 20 mg by mouth every other day.  12/05/19  Yes [provider]    Review of Systems  Constitutional: Positive for fatigue. Negative for appetite change.  HENT: Negative for congestion, postnasal drip and sore throat.   Eyes: Negative.   Respiratory: Positive for shortness of breath (with moderate exertion). Negative for cough and chest tightness.   Cardiovascular: Negative for chest pain, palpitations and leg swelling.  Gastrointestinal: Negative for abdominal distention and abdominal pain.  Endocrine: Negative.   Genitourinary: Negative.   Musculoskeletal:  Negative for back pain and neck pain.  Skin: Negative.   Allergic/Immunologic: Negative.   Neurological: Positive for light-headedness. Negative for dizziness.  Hematological: Negative for adenopathy. Does not bruise/bleed easily.  Psychiatric/Behavioral: Negative for dysphoric mood and sleep disturbance (sleeping on 2 pillows). The patient is not nervous/anxious.     Vitals:   03/20/20 1138  BP: (!) 141/77  Pulse: 72  Resp: 16  SpO2: 100%  Weight: 161 lb 8 oz (73.3 kg)  Height: 6\' 1"  (1.854 m)   Wt Readings from Last 3 Encounters:  03/20/20 161 lb 8 oz (73.3 kg)  03/01/20 187 lb 6.3 oz (85 kg)  02/08/20 180 lb (81.6 kg)   Lab Results  Component Value Date   CREATININE 2.16 (H) 03/02/2020   CREATININE 2.49 (H) 03/01/2020   CREATININE 2.73 (H) 02/29/2020    Physical Exam Vitals and nursing note reviewed.  Constitutional:      Appearance: Normal appearance.  HENT:     Head: Normocephalic and atraumatic.  Cardiovascular:     Rate and Rhythm: Normal rate and regular rhythm.  Pulmonary:     Effort: Pulmonary effort is normal. No respiratory distress.     Breath sounds: No wheezing or rales.  Abdominal:     General: There is no distension.     Palpations: Abdomen is soft.     Tenderness: There is no abdominal tenderness.  Musculoskeletal:        General: No tenderness.     Cervical back: Normal range of motion and neck supple.     Right lower leg: Edema (trace pitting) present.     Left lower leg: Edema (1+pitting) present.  Skin:    General: Skin is warm and dry.  Neurological:     General: No focal deficit present.     Mental Status: He is alert and oriented to person, place, and time.  Psychiatric:        Mood and Affect: Mood normal.        Behavior: Behavior normal.        Thought Content: Thought content normal.     Assessment & Plan:  1: Chronic heart failure with mildly reduced ejection fraction- - NYHA class II - euvolemic today - weighing daily and  he was instructed to call for an overnight weight gain of >2 pounds or a weekly weight gain of >5 pounds - hasn't been recently adding salt to his food & it trying to read food labels for sodium content; reviewed the importance of closely following a low sodium diet and written dietary information along with a low sodium cookbook were given to patient - saw cardiology (Paraschos) 03/05/20 - encouraged to wear compression socks daily with removal at bedtime; also encouraged him to elevate his legs when sitting for  long periods of time - BNP 02/28/20 was 1514.4 - reports receiving both COVID vaccines  2: HTN- - BP mildly elevated today but he hasn't taken his medications yet today - saw PCP Edwina Barth) 03/07/20 - BMP 03/05/20 reviewed and showed sodium 139, potassium 4.6, creatinine 1.91 and GFR 29  3: DM with CKD- - A1c 02/08/20 was 9.3% - saw nephrology Holley Raring) 03/05/20 - fasting glucose in clinic today was 278  4: COPD- - saw pulmonology Lanney Gins) 02/03/20   Patient did not bring his medications nor a list. Each medication was verbally reviewed with the patient and he was encouraged to bring the bottles to every visit to confirm accuracy of list.  Due to HF stability, will not make a return appointment for patient at this time. Advised patient that he could call back at anytime to schedule another appointment and patient was comfortable with this plan.

## 2020-03-20 NOTE — Patient Instructions (Addendum)
Continue weighing daily and call for an overnight weight gain of > 2 pounds or a weekly weight gain of >5 pounds.  Call us if you need [place].

## 2020-04-16 MED FILL — Sodium Chloride Flush IV Soln 0.9%: INTRAVENOUS | Qty: 3 | Status: AC

## 2020-04-16 MED FILL — Nutritional Supplement Liquid: ORAL | Qty: 237 | Status: AC

## 2020-06-08 ENCOUNTER — Other Ambulatory Visit: Payer: Self-pay

## 2020-06-08 ENCOUNTER — Encounter: Payer: Self-pay | Admitting: Emergency Medicine

## 2020-06-08 ENCOUNTER — Other Ambulatory Visit: Payer: Medicare Other | Attending: Pulmonary Disease

## 2020-06-08 ENCOUNTER — Other Ambulatory Visit: Payer: Self-pay | Admitting: Pulmonary Disease

## 2020-06-08 ENCOUNTER — Emergency Department: Payer: Medicare Other

## 2020-06-08 ENCOUNTER — Inpatient Hospital Stay
Admission: EM | Admit: 2020-06-08 | Discharge: 2020-06-10 | DRG: 291 | Disposition: A | Discharge status: Home / Self Care | Payer: Medicare Other | Attending: Internal Medicine | Admitting: Internal Medicine

## 2020-06-08 DIAGNOSIS — I5043 Acute on chronic combined systolic (congestive) and diastolic (congestive) heart failure: Secondary | ICD-10-CM | POA: Diagnosis present

## 2020-06-08 DIAGNOSIS — I5023 Acute on chronic systolic (congestive) heart failure: Principal | ICD-10-CM

## 2020-06-08 DIAGNOSIS — Z8249 Family history of ischemic heart disease and other diseases of the circulatory system: Secondary | ICD-10-CM

## 2020-06-08 DIAGNOSIS — J90 Pleural effusion, not elsewhere classified: Secondary | ICD-10-CM

## 2020-06-08 DIAGNOSIS — Z841 Family history of disorders of kidney and ureter: Secondary | ICD-10-CM

## 2020-06-08 DIAGNOSIS — Z794 Long term (current) use of insulin: Secondary | ICD-10-CM

## 2020-06-08 DIAGNOSIS — M1990 Unspecified osteoarthritis, unspecified site: Secondary | ICD-10-CM | POA: Diagnosis present

## 2020-06-08 DIAGNOSIS — D631 Anemia in chronic kidney disease: Secondary | ICD-10-CM | POA: Diagnosis present

## 2020-06-08 DIAGNOSIS — Z7982 Long term (current) use of aspirin: Secondary | ICD-10-CM | POA: Diagnosis not present

## 2020-06-08 DIAGNOSIS — I509 Heart failure, unspecified: Secondary | ICD-10-CM

## 2020-06-08 DIAGNOSIS — Z91040 Latex allergy status: Secondary | ICD-10-CM

## 2020-06-08 DIAGNOSIS — Z833 Family history of diabetes mellitus: Secondary | ICD-10-CM | POA: Diagnosis not present

## 2020-06-08 DIAGNOSIS — R778 Other specified abnormalities of plasma proteins: Secondary | ICD-10-CM | POA: Diagnosis present

## 2020-06-08 DIAGNOSIS — Z9889 Other specified postprocedural states: Secondary | ICD-10-CM

## 2020-06-08 DIAGNOSIS — J449 Chronic obstructive pulmonary disease, unspecified: Secondary | ICD-10-CM | POA: Diagnosis present

## 2020-06-08 DIAGNOSIS — Z888 Allergy status to other drugs, medicaments and biological substances status: Secondary | ICD-10-CM

## 2020-06-08 DIAGNOSIS — Z7901 Long term (current) use of anticoagulants: Secondary | ICD-10-CM

## 2020-06-08 DIAGNOSIS — Z20822 Contact with and (suspected) exposure to covid-19: Secondary | ICD-10-CM | POA: Diagnosis present

## 2020-06-08 DIAGNOSIS — Z79899 Other long term (current) drug therapy: Secondary | ICD-10-CM

## 2020-06-08 DIAGNOSIS — I130 Hypertensive heart and chronic kidney disease with heart failure and stage 1 through stage 4 chronic kidney disease, or unspecified chronic kidney disease: Principal | ICD-10-CM | POA: Diagnosis present

## 2020-06-08 DIAGNOSIS — E1122 Type 2 diabetes mellitus with diabetic chronic kidney disease: Secondary | ICD-10-CM | POA: Diagnosis present

## 2020-06-08 DIAGNOSIS — E785 Hyperlipidemia, unspecified: Secondary | ICD-10-CM | POA: Diagnosis present

## 2020-06-08 DIAGNOSIS — N1832 Chronic kidney disease, stage 3b: Secondary | ICD-10-CM | POA: Diagnosis present

## 2020-06-08 DIAGNOSIS — Z86718 Personal history of other venous thrombosis and embolism: Secondary | ICD-10-CM

## 2020-06-08 DIAGNOSIS — I248 Other forms of acute ischemic heart disease: Secondary | ICD-10-CM | POA: Diagnosis present

## 2020-06-08 DIAGNOSIS — I2510 Atherosclerotic heart disease of native coronary artery without angina pectoris: Secondary | ICD-10-CM | POA: Diagnosis present

## 2020-06-08 LAB — CBC WITH DIFFERENTIAL/PLATELET
Abs Immature Granulocytes: 0.01 10*3/uL (ref 0.00–0.07)
Basophils Absolute: 0 10*3/uL (ref 0.0–0.1)
Basophils Relative: 1 %
Eosinophils Absolute: 0.1 10*3/uL (ref 0.0–0.5)
Eosinophils Relative: 1 %
HCT: 28.6 % — ABNORMAL LOW (ref 39.0–52.0)
Hemoglobin: 9.8 g/dL — ABNORMAL LOW (ref 13.0–17.0)
Immature Granulocytes: 0 %
Lymphocytes Relative: 14 %
Lymphs Abs: 0.6 10*3/uL — ABNORMAL LOW (ref 0.7–4.0)
MCH: 32.2 pg (ref 26.0–34.0)
MCHC: 34.3 g/dL (ref 30.0–36.0)
MCV: 94.1 fL (ref 80.0–100.0)
Monocytes Absolute: 0.4 10*3/uL (ref 0.1–1.0)
Monocytes Relative: 9 %
Neutro Abs: 3.1 10*3/uL (ref 1.7–7.7)
Neutrophils Relative %: 75 %
Platelets: 138 10*3/uL — ABNORMAL LOW (ref 150–400)
RBC: 3.04 MIL/uL — ABNORMAL LOW (ref 4.22–5.81)
RDW: 13.3 % (ref 11.5–15.5)
WBC: 4.1 10*3/uL (ref 4.0–10.5)
nRBC: 0 % (ref 0.0–0.2)

## 2020-06-08 LAB — BASIC METABOLIC PANEL
Anion gap: 10 (ref 5–15)
BUN: 59 mg/dL — ABNORMAL HIGH (ref 8–23)
CO2: 25 mmol/L (ref 22–32)
Calcium: 8.8 mg/dL — ABNORMAL LOW (ref 8.9–10.3)
Chloride: 103 mmol/L (ref 98–111)
Creatinine, Ser: 2.29 mg/dL — ABNORMAL HIGH (ref 0.61–1.24)
GFR, Estimated: 29 mL/min — ABNORMAL LOW (ref >60)
Glucose, Bld: 341 mg/dL — ABNORMAL HIGH (ref 70–99)
Potassium: 4.9 mmol/L (ref 3.5–5.1)
Sodium: 138 mmol/L (ref 135–145)

## 2020-06-08 LAB — TROPONIN I (HIGH SENSITIVITY)
Troponin I (High Sensitivity): 168 ng/L (ref <18)
Troponin I (High Sensitivity): 148 ng/L (ref <18)

## 2020-06-08 LAB — PROTIME-INR
INR: 1.3 — ABNORMAL HIGH (ref 0.8–1.2)
Prothrombin Time: 16 seconds — ABNORMAL HIGH (ref 11.4–15.2)

## 2020-06-08 LAB — RESPIRATORY PANEL BY RT PCR (FLU A&B, COVID)
Influenza A by PCR: NEGATIVE
Influenza B by PCR: NEGATIVE
SARS Coronavirus 2 by RT PCR: NEGATIVE

## 2020-06-08 LAB — BRAIN NATRIURETIC PEPTIDE: B Natriuretic Peptide: 1490.5 pg/mL — ABNORMAL HIGH (ref 0.0–100.0)

## 2020-06-08 LAB — GLUCOSE, CAPILLARY: Glucose-Capillary: 395 mg/dL — ABNORMAL HIGH (ref 70–99)

## 2020-06-08 MED ORDER — FERROUS SULFATE 325 (65 FE) MG PO TABS
325.0000 mg | ORAL_TABLET | Freq: Two times a day (BID) | ORAL | Status: DC
  Filled 2020-06-08 (×3): qty 1

## 2020-06-08 MED ORDER — INSULIN DETEMIR 100 UNIT/ML ~~LOC~~ SOLN
10.0000 [IU] | Freq: Every day | SUBCUTANEOUS | Status: DC
  Administered 2020-06-08 – 2020-06-09 (×2): 10 [IU] via SUBCUTANEOUS
  Filled 2020-06-08 (×3): qty 0.1

## 2020-06-08 MED ORDER — ACETAMINOPHEN 325 MG PO TABS
650.0000 mg | ORAL_TABLET | ORAL | Status: DC | PRN
  Administered 2020-06-10: 16:00:00 650 mg via ORAL
  Filled 2020-06-08: qty 2

## 2020-06-08 MED ORDER — ISOSORBIDE MONONITRATE ER 30 MG PO TB24
30.0000 mg | ORAL_TABLET | Freq: Every day | ORAL | Status: DC
  Administered 2020-06-08 – 2020-06-10 (×3): 30 mg via ORAL
  Filled 2020-06-08 (×3): qty 1

## 2020-06-08 MED ORDER — FUROSEMIDE 10 MG/ML IJ SOLN
40.0000 mg | Freq: Once | INTRAMUSCULAR | Status: AC
  Administered 2020-06-08: 40 mg via INTRAVENOUS
  Filled 2020-06-08: qty 4

## 2020-06-08 MED ORDER — SODIUM CHLORIDE 0.9 % IV SOLN: 250.0000 mL | INTRAVENOUS | Status: DC | PRN

## 2020-06-08 MED ORDER — FUROSEMIDE 10 MG/ML IJ SOLN
40.0000 mg | Freq: Two times a day (BID) | INTRAMUSCULAR | Status: DC
  Administered 2020-06-08 – 2020-06-10 (×4): 40 mg via INTRAVENOUS
  Filled 2020-06-08 (×4): qty 4

## 2020-06-08 MED ORDER — CARVEDILOL 3.125 MG PO TABS
6.2500 mg | ORAL_TABLET | Freq: Two times a day (BID) | ORAL | Status: DC
  Administered 2020-06-08 – 2020-06-10 (×4): 6.25 mg via ORAL
  Filled 2020-06-08 (×4): qty 2

## 2020-06-08 MED ORDER — INSULIN ASPART 100 UNIT/ML ~~LOC~~ SOLN
0.0000 [IU] | Freq: Every day | SUBCUTANEOUS | Status: DC
  Administered 2020-06-08: 5 [IU] via SUBCUTANEOUS
  Administered 2020-06-09: 3 [IU] via SUBCUTANEOUS
  Filled 2020-06-08 (×2): qty 1

## 2020-06-08 MED ORDER — SODIUM CHLORIDE 0.9% FLUSH
3.0000 mL | Freq: Two times a day (BID) | INTRAVENOUS | Status: DC
  Administered 2020-06-08 – 2020-06-10 (×4): 3 mL via INTRAVENOUS

## 2020-06-08 MED ORDER — INSULIN ASPART 100 UNIT/ML ~~LOC~~ SOLN
0.0000 [IU] | Freq: Three times a day (TID) | SUBCUTANEOUS | Status: DC
  Administered 2020-06-09: 3 [IU] via SUBCUTANEOUS
  Administered 2020-06-10: 2 [IU] via SUBCUTANEOUS
  Filled 2020-06-08 (×2): qty 1

## 2020-06-08 MED ORDER — SODIUM CHLORIDE 0.9% FLUSH: 3.0000 mL | INTRAVENOUS | Status: DC | PRN

## 2020-06-08 MED ORDER — ONDANSETRON HCL 4 MG/2ML IJ SOLN: 4.0000 mg | Freq: Four times a day (QID) | INTRAMUSCULAR | Status: DC | PRN

## 2020-06-08 MED ORDER — AMLODIPINE BESYLATE 10 MG PO TABS
10.0000 mg | ORAL_TABLET | Freq: Every day | ORAL | Status: DC
  Administered 2020-06-08: 10 mg via ORAL
  Filled 2020-06-08: qty 1

## 2020-06-08 MED ORDER — ASPIRIN EC 81 MG PO TBEC
81.0000 mg | DELAYED_RELEASE_TABLET | Freq: Every day | ORAL | Status: DC
  Administered 2020-06-08 – 2020-06-10 (×2): 81 mg via ORAL
  Filled 2020-06-08 (×2): qty 1

## 2020-06-08 NOTE — ED Notes (Signed)
Placed patient on 2L [place].

## 2020-06-08 NOTE — ED Notes (Signed)
Date and time results received: 06/08/20 1249 (use smartphrase ".now" to insert current time)  Test: Trop  Critical Value: 168  Name of Provider Notified: Dr. Charlsie Quest  Orders Received? Or Actions Taken?: Critical Results Acknowledged

## 2020-06-08 NOTE — H&P (Signed)
History and Physical    Rick Zuniga GYI:[id number] DOB: 08/19/1945 DOA: 06/08/2020  PCP: [name], MD   Patient coming from: Home  I have personally briefly reviewed patient's old medical records in [place]  Chief Complaint: Worsening shortness of breath  HPI: Rick Zuniga is a 74 y.o. male with medical history significant of COPD, HFrEF with EF of 40 to 45%, recurrent left-sided pleural effusion, diabetes, CKD, hypertension, DVT on Eliquis and hyperlipidemia was sent to ED from his pulmonologist office with worsening shortness of breath.  According to patient he is compliant with his medication, recently increased dose of torsemide.  Having good and bad days with urinary output.  Worsening shortness of breath over the past week, unable to sleep for the past 2 nights as he cannot lay flat due to shortness of breath.  No prior history of orthopnea or PND.  Patient has an history of recurrent left-sided pleural effusion which was being managed with diuresis and blood past.  Worsening lower extremity edema extending up to thighs.  Patient denies any cough, congestion, fever or chills.  No sick contacts.  Denies any chest pain or palpitations.  No recent change in his appetite or bowel habits.  No increase in urinary urgency or frequency.  ED Course: Hemodynamically stable, labs significant for hemoglobin of 9.8, platelets of 138, troponin I 68>>148, blood glucose of 341, BUN of 59 and creatinine of 2.29 which appears at baseline, chest x-ray with moderate left pleural effusion and negative for pulmonary edema, ultrasound-guided thoracentesis was ordered by ED provider which was postponed till tomorrow morning as COVID-19 was not done.  Review of Systems: As per HPI otherwise 10 point review of systems negative.   Past Medical History:  Diagnosis Date  . Arthritis   . CHF (congestive heart failure) ([place])   . Chronic kidney disease   . COPD (chronic obstructive  pulmonary disease) ([place])   . Diabetes ([place])   . Heart disease   . Hypertension   . Indigestion   . Thrombocytopenia ([place])   . Wears dentures    full upper and lower    Past Surgical History:  Procedure Laterality Date  . APPENDECTOMY    . CARDIAC SURGERY     bypass  . CHOLECYSTECTOMY    . COLONOSCOPY  12/23/2010     reports that he has never smoked. He has never used smokeless tobacco. He reports that he does not drink alcohol and does not use drugs.  Allergies  Allergen Reactions  . Pioglitazone Swelling  . Metformin And Related Diarrhea  . Other     Pt reported that he is allergic to Ace wrap.  Reaction include severe skin irritation.  . Atorvastatin Rash  . Benazepril Rash  . Latex Rash    (tape only)  . Tape Rash    Family History  Problem Relation Age of Onset  . CAD Mother   . Kidney failure Father   . Diabetes Father     Prior to Admission medications   Medication Sig Start Date End Date Taking? Authorizing Provider  amLODipine (NORVASC) 10 MG tablet Take 1 tablet (10 mg total) by mouth daily. 05/26/19 03/20/20  Vaughan Basta, MD  apixaban (ELIQUIS) 5 MG TABS tablet Take 1 tablet (5 mg total) by mouth 2 (two) times daily. 02/16/20   Ezekiel Slocumb, DO  aspirin EC 81 MG tablet Take 81 mg by mouth daily.     [provider]  carvedilol (  COREG) 6.25 MG tablet Take 6.25 mg by mouth 2 (two) times daily with a meal.    [provider]  ferrous sulfate 325 (65 FE) MG tablet Take 1 tablet (325 mg total) by mouth 2 (two) times daily with a meal. 02/15/20   Nicole Kindred A, DO  glipiZIDE (GLUCOTROL) 10 MG tablet Take 10 mg by mouth 2 (two) times daily.     [provider]  insulin NPH-regular Human (NOVOLIN 70/30) (70-30) 100 UNIT/ML injection Inject 10 Units into the skin 2 (two) times daily.     [provider]  isosorbide mononitrate (IMDUR) 30 MG 24 hr tablet Take 30 mg by mouth daily.  08/19/19   [provider]    meclizine (ANTIVERT) 25 MG tablet Take 1 tablet (25 mg total) by mouth 3 (three) times daily as needed for dizziness. 03/02/20   Sharen Hones, MD  nitroGLYCERIN (NITROSTAT) 0.4 MG SL tablet Place 0.4 mg under the tongue every 5 (five) minutes as needed for chest pain.    [provider]  torsemide (DEMADEX) 20 MG tablet Take 20 mg by mouth every other day.  12/05/19   [provider]    Physical Exam: Vitals:   06/08/20 1107 06/08/20 1108 06/08/20 1438  BP: (!) 142/66  137/77  Pulse: 78  84  Resp: (!) 22  18  Temp: 97.8 F (36.6 C)  97.6 F (36.4 C)  TempSrc: Oral  Oral  SpO2: 97%  99%  Weight:  73.3 kg   Height:  6\' 1"  (1.854 m)     General: Vital signs reviewed.  Patient is well-developed and well-nourished, in no acute distress and cooperative with exam.  Head: Normocephalic and atraumatic. Eyes: EOMI, conjunctivae normal, no scleral icterus.  ENMT: Mucous membranes are moist.  Cardiovascular: RRR, S1 normal, S2 normal, no murmurs, gallops, or rubs. Pulmonary/Chest: Decreased breath sounds at left base, no crackles. Abdominal: Soft, non-tender, non-distended, BS +,  Extremities: 2+ LE lower extremity edema bilaterally,  pulses symmetric and intact bilaterally.  Neurological: A&O x3, Strength is normal and symmetric bilaterally, cranial nerve II-XII are grossly intact, no focal motor deficit, sensory intact to light touch bilaterally.  Skin: Warm, dry and intact.  Psychiatric: Normal mood and affect.   Labs on Admission: I have personally reviewed following labs and imaging studies  CBC: Recent Labs  Lab 06/08/20 1113  WBC 4.1  NEUTROABS 3.1  HGB 9.8*  HCT 28.6*  MCV 94.1  PLT 573*   Basic Metabolic Panel: Recent Labs  Lab 06/08/20 1113  NA 138  K 4.9  CL 103  CO2 25  GLUCOSE 341*  BUN 59*  CREATININE 2.29*  CALCIUM 8.8*   GFR: Estimated Creatinine Clearance: 28.9 mL/min (A) (by C-G formula based on SCr of 2.29 mg/dL (H)). Liver Function  Tests: No results for input(s): AST, ALT, ALKPHOS, BILITOT, PROT, ALBUMIN in the last 168 hours. No results for input(s): LIPASE, AMYLASE in the last 168 hours. No results for input(s): AMMONIA in the last 168 hours. Coagulation Profile: Recent Labs  Lab 06/08/20 1543  INR 1.3*   Cardiac Enzymes: No results for input(s): CKTOTAL, CKMB, CKMBINDEX, TROPONINI in the last 168 hours. BNP (last 3 results) No results for input(s): PROBNP in the last 8760 hours. HbA1C: No results for input(s): HGBA1C in the last 72 hours. CBG: No results for input(s): GLUCAP in the last 168 hours. Lipid Profile: No results for input(s): CHOL, HDL, LDLCALC, TRIG, CHOLHDL, LDLDIRECT in the last 72  hours. Thyroid Function Tests: No results for input(s): TSH, T4TOTAL, FREET4, T3FREE, THYROIDAB in the last 72 hours. Anemia Panel: No results for input(s): VITAMINB12, FOLATE, FERRITIN, TIBC, IRON, RETICCTPCT in the last 72 hours. Urine analysis:    Component Value Date/Time   COLORURINE YELLOW (A) 02/11/2020 1608   APPEARANCEUR HAZY (A) 02/11/2020 1608   LABSPEC 1.013 02/11/2020 1608   PHURINE 5.0 02/11/2020 1608   GLUCOSEU NEGATIVE 02/11/2020 1608   HGBUR MODERATE (A) 02/11/2020 1608   BILIRUBINUR NEGATIVE 02/11/2020 1608   KETONESUR NEGATIVE 02/11/2020 1608   PROTEINUR 30 (A) 02/11/2020 1608   NITRITE NEGATIVE 02/11/2020 1608   LEUKOCYTESUR NEGATIVE 02/11/2020 1608    Radiological Exams on Admission: DG Chest 2 View  Result Date: 06/08/2020 CLINICAL DATA:  Short of breath.  Fluid in lungs. EXAM: CHEST - 2 VIEW COMPARISON:  02/28/2020 FINDINGS: Postop CABG. Negative for vascular congestion. Moderate left effusion unchanged with left lower lobe atelectasis. Interval improvement in right effusion and right lower lobe atelectasis since the prior study. No pulmonary edema. IMPRESSION: Moderate left effusion and left lower lobe atelectasis unchanged. Improved aeration right lung base. Negative for edema.  Electronically Signed   By: Franchot Gallo M.D.   On: 06/08/2020 11:42   EKG: Independently reviewed.   Assessment/Plan Active Problems:   Acute exacerbation of CHF (congestive heart failure) (HCC)   Acute on chronic combined systolic and diastolic heart failure.  Patient has EF of 40 to 45% with grade 2 diastolic dysfunction on an echo done in June 2021.  He was given 1 dose of IV Lasix.  Saturating well on room air, appears volume overload with significant lower extremity edema. -IV Lasix 40 mg twice daily. -Continue with home dose of Coreg. -Daily weight and BMP. -Strict intake and output  Left-sided pleural effusion.  Low risk for infectious etiology.  Has an history of prior pleural effusion which were taken care with diuresis. US guided thoracentesis was ordered but unable to done as patient did not had her Covid testing done.  Most likely be done tomorrow morning.  Elevated troponin.  Most likely secondary to demand ischemia.  Started trending down.  No chest pain.  COPD.  No wheezing.  History of DVT.  Patient was found to have a lower extremity DVT in July 2021 and was placed on Eliquis since then.  No acute concern. Patient did not had Eliquis today, we will hold it till he has his procedure tomorrow.  Diabetes mellitus with renal complications.  CBG elevated above 300. A1c done in June was 9.3.  On glipizide and Novolin 70/30 at home. -Check A1c. -Levemir 10 units at bedtime. -SSI. -We will get benefit with Jardiance instead of glipizide on discharge.  CKD stage IIIb.  Creatinine seems stable. -Continue to monitor renal function. -Avoid nephrotoxins.  DVT prophylaxis:   Code Status: Full code Family Communication: Wife was updated at bedside Disposition Plan: Home  Consults called: Cardiology Admission status: Inpatient   Lorella Nimrod MD [facility]  If 7PM-7AM, please contact night-coverage [email or web address]  06/08/2020, 5:02 PM   This record has been  created using Systems analyst. Errors have been sought and corrected,but may not always be located. Such creation errors do not reflect on the standard of care.

## 2020-06-08 NOTE — Progress Notes (Signed)
Pt arrived from ED, alert and oriented, sitting up on side of bed. Nurse tech at bedside doing vital signs at this time

## 2020-06-08 NOTE — ED Notes (Signed)
Received call from ultrasound stating that no current covid test has been done, and being that it will take 2 hours to result, radiologist will have left by 5pm. Therefore unlikely to have thoracentesis completed today. MD notified.

## 2020-06-08 NOTE — ED Notes (Signed)
Called Lab to add on new blood orders to blood already sent to lab

## 2020-06-08 NOTE — ED Notes (Signed)
Provided urinal. Placed at bedside for use.

## 2020-06-08 NOTE — Progress Notes (Signed)
Report received from [place] in the ED.  Pt to transfer to 101 for continued medical treatment and a thoracentesis in the am per her report

## 2020-06-08 NOTE — ED Triage Notes (Signed)
Pt presents to ED via wheelchair from [facility] with c/o increasing SOB, had appt with pulmonologist this morning and was sent to ED to have fluid drawn off his lungs. Pt states has been having increasing SOB x 3 days.

## 2020-06-08 NOTE — ED Provider Notes (Addendum)
[facility] Emergency Department Provider Note  ____________________________________________   First MD Initiated Contact with Patient 06/08/20 1516     (approximate)  I have reviewed the triage vital signs and the nursing notes.   HISTORY  Chief Complaint Shortness of Breath    HPI Rick Zuniga is a 74 y.o. male  With PMHx COPD, CHF, CKD, HTN, HLD, here with SOB. Pt reports that over the last week, he's had progressively worsening SOB and cough. He states that over the last 2 days, his SOB has worsened and he was essentially unable to breathe at all overnight. He felt like he was "not going to make it" overnight. He went to Dr. Mickey Farber office today and was sent here for thoracentesis. Pt has a long h/o COPD, CKD, CHF and recurrent L sided effusions, though he states that last time he was able to diurese it off w/o thoracentesis. He is on Eliquis and did take It last night but not this AM. No fever, chills. No sputum production. No chest pain. SOB is worse w/ position changes and lying flat. No alleviating factors.       Past Medical History:  Diagnosis Date  . Arthritis   . CHF (congestive heart failure) ([place])   . Chronic kidney disease   . COPD (chronic obstructive pulmonary disease) ([place])   . Diabetes ([place])   . Heart disease   . Hypertension   . Indigestion   . Thrombocytopenia ([place])   . Wears dentures    full upper and lower    Patient Active Problem List   Diagnosis Date Noted  . Acute on chronic combined systolic and diastolic CHF (congestive heart failure) ([place]) 02/29/2020  . SOB (shortness of breath) 02/28/2020  . Acute kidney injury superimposed on CKD ([place]) 02/08/2020  . 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 Nocturnal leg cramps 12/21/2019  . Pleural effusion 12/08/2019  . Shortness of breath 12/08/2019  . Chest pain 05/25/2019  . Esophageal dysphagia 05/09/2019  . Functional diarrhea 05/09/2019  . RUQ pain 05/09/2019  .  Other male erectile dysfunction 11/11/2018  . Lumbar radiculitis 10/17/2018  . Pedal edema 12/14/2017  . Vertical diplopia 11/02/2017  . Accelerated hypertension 10/27/2017  . Thrombocytopenia ([place]) 02/28/2016  . Anemia 02/28/2016  . Hyperlipidemia 10/23/2014  . CAD (coronary artery disease) 02/22/2014  . Hypertension associated with diabetes ([place]) 02/22/2014  . Type 2 diabetes mellitus with microalbuminuria, with long-term current use of insulin ([place]) 02/22/2014    Past Surgical History:  Procedure Laterality Date  . APPENDECTOMY    . CARDIAC SURGERY     bypass  . CHOLECYSTECTOMY    . COLONOSCOPY  12/23/2010    Prior to Admission medications   Medication Sig Start Date End Date Taking? Authorizing Provider  amLODipine (NORVASC) 10 MG tablet Take 1 tablet (10 mg total) by mouth daily. 05/26/19 03/20/20  Vaughan Basta, MD  apixaban (ELIQUIS) 5 MG TABS tablet Take 1 tablet (5 mg total) by mouth 2 (two) times daily. 02/16/20   Ezekiel Slocumb, DO  aspirin EC 81 MG tablet Take 81 mg by mouth daily.     [provider]  carvedilol (COREG) 6.25 MG tablet Take 6.25 mg by mouth 2 (two) times daily with a meal.    [provider]  ferrous sulfate 325 (65 FE) MG tablet Take 1 tablet (325 mg total) by mouth 2 (two) times daily with a meal. 02/15/20   Nicole Kindred A, DO  glipiZIDE ([place])  10 MG tablet Take 10 mg by mouth 2 (two) times daily.     [provider]  insulin NPH-regular Human (NOVOLIN 70/30) (70-30) 100 UNIT/ML injection Inject 10 Units into the skin 2 (two) times daily.     [provider]  isosorbide mononitrate (IMDUR) 30 MG 24 hr tablet Take 30 mg by mouth daily.  08/19/19   [provider]  meclizine (ANTIVERT) 25 MG tablet Take 1 tablet (25 mg total) by mouth 3 (three) times daily as needed for dizziness. 03/02/20   Sharen Hones, MD  nitroGLYCERIN (NITROSTAT) 0.4 MG SL tablet Place 0.4 mg under the tongue every 5 (five)  minutes as needed for chest pain.    [provider]  torsemide (DEMADEX) 20 MG tablet Take 20 mg by mouth every other day.  12/05/19   [provider]    Allergies Pioglitazone, Metformin and related, Other, Atorvastatin, Benazepril, Latex, and Tape  Family History  Problem Relation Age of Onset  . CAD Mother   . Kidney failure Father   . Diabetes Father     Social History Social History   Tobacco Use  . Smoking status: Never Smoker  . Smokeless tobacco: Never Used  Vaping Use  . Vaping Use: Never used  Substance Use Topics  . Alcohol use: No  . Drug use: No    Review of Systems  Review of Systems  Constitutional: Positive for fatigue. Negative for chills and fever.  HENT: Negative for sore throat.   Respiratory: Positive for cough and shortness of breath.   Cardiovascular: Negative for chest pain.  Gastrointestinal: Negative for abdominal pain.  Genitourinary: Negative for flank pain.  Musculoskeletal: Negative for neck pain.  Skin: Negative for rash and wound.  Allergic/Immunologic: Negative for immunocompromised state.  Neurological: Positive for weakness. Negative for numbness.  Hematological: Does not bruise/bleed easily.  All other systems reviewed and are negative.    ____________________________________________  PHYSICAL EXAM:      VITAL SIGNS: ED Triage Vitals  Enc Vitals Group     BP 06/08/20 1107 (!) 142/66     Pulse Rate 06/08/20 1107 78     Resp 06/08/20 1107 (!) 22     Temp 06/08/20 1107 97.8 F (36.6 C)     Temp Source 06/08/20 1107 Oral     SpO2 06/08/20 1107 97 %     Weight 06/08/20 1108 161 lb 9.6 oz (73.3 kg)     Height 06/08/20 1108 6\' 1"  (1.854 m)     Head Circumference --      Peak Flow --      Pain Score 06/08/20 1108 0     Pain Loc --      Pain Edu? --      Excl. in [place]? --      Physical Exam Vitals and nursing note reviewed.  Constitutional:      General: He is not in acute distress.    Appearance: He is  well-developed.  HENT:     Head: Normocephalic and atraumatic.  Eyes:     Conjunctiva/sclera: Conjunctivae normal.  Cardiovascular:     Rate and Rhythm: Normal rate and regular rhythm.     Heart sounds: Normal heart sounds. No murmur heard.  No friction rub.  Pulmonary:     Effort: Pulmonary effort is normal. Tachypnea present. No respiratory distress.     Breath sounds: Examination of the left-middle field reveals decreased breath sounds. Examination of the right-lower field reveals rales. Examination of  the left-lower field reveals decreased breath sounds. Decreased breath sounds and rales present. No wheezing.  Abdominal:     General: There is no distension.     Palpations: Abdomen is soft.     Tenderness: There is no abdominal tenderness.  Musculoskeletal:     Cervical back: Neck supple.     Right lower leg: Edema present.     Left lower leg: Edema present.  Skin:    General: Skin is warm.     Capillary Refill: Capillary refill takes less than 2 seconds.  Neurological:     Mental Status: He is alert and oriented to person, place, and time.     Motor: No abnormal muscle tone.       ____________________________________________   LABS (all labs ordered are listed, but only abnormal results are displayed)  Labs Reviewed  CBC WITH DIFFERENTIAL/PLATELET - Abnormal; Notable for the following components:      Result Value   RBC 3.04 (*)    Hemoglobin 9.8 (*)    HCT 28.6 (*)    Platelets 138 (*)    Lymphs Abs 0.6 (*)    All other components within normal limits  BASIC METABOLIC PANEL - Abnormal; Notable for the following components:   Glucose, Bld 341 (*)    BUN 59 (*)    Creatinine, Ser 2.29 (*)    Calcium 8.8 (*)    GFR, Estimated 29 (*)    All other components within normal limits  TROPONIN I (HIGH SENSITIVITY) - Abnormal; Notable for the following components:   Troponin I (High Sensitivity) 168 (*)    All other components within normal limits  TROPONIN I (HIGH  SENSITIVITY) - Abnormal; Notable for the following components:   Troponin I (High Sensitivity) 148 (*)    All other components within normal limits  RESPIRATORY PANEL BY RT PCR (FLU A&B, COVID)  PROTIME-INR  BRAIN NATRIURETIC PEPTIDE    ____________________________________________  EKG: Normal sinus rhythm, ventricular rate 80.  PR 192, QRS 140, QTc 44.  Nonspecific intraventricular block.  Nonspecific T wave changes.  No ST elevations. ________________________________________  RADIOLOGY All imaging, including plain films, CT scans, and ultrasounds, independently reviewed by me, and interpretations confirmed via formal radiology reads.  ED MD interpretation:   CXR: Mod left effusion and LLL atelectasis, improved aeration R lung base  Official radiology report(s): DG Chest 2 View  Result Date: 06/08/2020 CLINICAL DATA:  Short of breath.  Fluid in lungs. EXAM: CHEST - 2 VIEW COMPARISON:  02/28/2020 FINDINGS: Postop CABG. Negative for vascular congestion. Moderate left effusion unchanged with left lower lobe atelectasis. Interval improvement in right effusion and right lower lobe atelectasis since the prior study. No pulmonary edema. IMPRESSION: Moderate left effusion and left lower lobe atelectasis unchanged. Improved aeration right lung base. Negative for edema. Electronically Signed   By: Franchot Gallo M.D.   On: 06/08/2020 11:42    ____________________________________________  PROCEDURES   Procedure(s) performed (including Critical Care):  Procedures  ____________________________________________  INITIAL IMPRESSION / MDM / [place] / ED COURSE  As part of my medical decision making, I reviewed the following data within the [facility] notes reviewed and incorporated, Old chart reviewed, Notes from prior ED visits, and [place] Controlled Substance Database       *TARRON KROLAK was evaluated in Emergency Department on 06/08/2020 for the  symptoms described in the history of present illness. He was evaluated in the context of the global COVID-19 pandemic,  which necessitated consideration that the patient might be at risk for infection with the SARS-CoV-2 virus that causes COVID-19. Institutional protocols and algorithms that pertain to the evaluation of patients at risk for COVID-19 are in a state of rapid change based on information released by regulatory bodies including the CDC and federal and state organizations. These policies and algorithms were followed during the patient's care in the ED.  Some ED evaluations and interventions may be delayed as a result of limited staffing during the pandemic.*     Medical Decision Making:  73 yo M here with SOB, recurrent L sided effusion, and pitting edema bl LE. Suspect acute on chronic CHF exacerbation with recurrent effusion causing dyspnea, midl demand ischemia. Radiology contacted but states they cannot perform thora without COVID test result and they leave in 1.5 hr. Given his normal sats on RA, likely chronic nature of effusion and otherwise clinica stability, feel it's reasonable to admit for diuresis, NPO at MN, and hold his Eliquis. Otherwise, labs show CKD, trop elevation in the 140s which I suspect is demand. No ST elevations on EKG. CXR reviewed by me and shows L sided pleural effusion.   IV lasix given, will admit to Hospitalist. Dr. Lanney Gins notified and in agreement. Pt monitored on telemetry, reviewed and shows sinus rhythm, no ectopy, no significant abnormalities. BNP, trop both elevated - likely CHF as discussed.  ____________________________________________  FINAL CLINICAL IMPRESSION(S) / ED DIAGNOSES  Final diagnoses:  Pleural effusion on left  Acute on chronic systolic congestive heart failure (HCC)  Elevated troponin     MEDICATIONS GIVEN DURING THIS VISIT:  Medications  furosemide (LASIX) injection 40 mg (has no administration in time range)     ED  Discharge Orders    None       Note:  This document was prepared using Dragon voice recognition software and may include unintentional dictation errors.   Duffy Bruce, MD 06/08/20 1557    Duffy Bruce, MD 06/08/20 [id number]

## 2020-06-09 ENCOUNTER — Inpatient Hospital Stay: Payer: Medicare Other

## 2020-06-09 DIAGNOSIS — J90 Pleural effusion, not elsewhere classified: Secondary | ICD-10-CM | POA: Diagnosis not present

## 2020-06-09 DIAGNOSIS — I5023 Acute on chronic systolic (congestive) heart failure: Secondary | ICD-10-CM | POA: Diagnosis not present

## 2020-06-09 DIAGNOSIS — R778 Other specified abnormalities of plasma proteins: Secondary | ICD-10-CM | POA: Diagnosis not present

## 2020-06-09 LAB — BASIC METABOLIC PANEL
Anion gap: 6 (ref 5–15)
BUN: 59 mg/dL — ABNORMAL HIGH (ref 8–23)
CO2: 29 mmol/L (ref 22–32)
Calcium: 8.6 mg/dL — ABNORMAL LOW (ref 8.9–10.3)
Chloride: 108 mmol/L (ref 98–111)
Creatinine, Ser: 2.26 mg/dL — ABNORMAL HIGH (ref 0.61–1.24)
GFR, Estimated: 30 mL/min — ABNORMAL LOW (ref >60)
Glucose, Bld: 168 mg/dL — ABNORMAL HIGH (ref 70–99)
Potassium: 4.1 mmol/L (ref 3.5–5.1)
Sodium: 143 mmol/L (ref 135–145)

## 2020-06-09 LAB — HEPARIN LEVEL (UNFRACTIONATED): Heparin Unfractionated: 1.45 IU/mL — ABNORMAL HIGH (ref 0.30–0.70)

## 2020-06-09 LAB — GLUCOSE, CAPILLARY
Glucose-Capillary: 70 mg/dL (ref 70–99)
Glucose-Capillary: 100 mg/dL — ABNORMAL HIGH (ref 70–99)
Glucose-Capillary: 231 mg/dL — ABNORMAL HIGH (ref 70–99)
Glucose-Capillary: 257 mg/dL — ABNORMAL HIGH (ref 70–99)

## 2020-06-09 LAB — APTT: aPTT: 35 seconds (ref 24–36)

## 2020-06-09 MED ORDER — ENOXAPARIN SODIUM 80 MG/0.8ML ~~LOC~~ SOLN
1.0000 mg/kg | Freq: Two times a day (BID) | SUBCUTANEOUS | Status: DC
  Administered 2020-06-09 – 2020-06-10 (×2): 80 mg via SUBCUTANEOUS
  Filled 2020-06-09 (×4): qty 0.8

## 2020-06-09 MED ORDER — ENOXAPARIN SODIUM 40 MG/0.4ML ~~LOC~~ SOLN: 40.0000 mg | SUBCUTANEOUS | Status: DC

## 2020-06-09 NOTE — Progress Notes (Addendum)
PROGRESS NOTE    Rick Zuniga  FYB:[id number] DOB: [birth year] DOA: 06/08/2020 PCP: [name], MD   Brief Narrative: Taken from H&P Rick Zuniga is a 74 y.o. male with medical history significant of COPD, HFrEF with EF of 40 to 45%, recurrent left-sided pleural effusion, diabetes, CKD, hypertension, DVT on Eliquis and hyperlipidemia was sent to ED from his pulmonologist office with worsening shortness of breath.  According to patient he is compliant with his medication, recently increased dose of torsemide.  Patient endorses orthopnea and PND for the past couple of nights.  Chest x-ray with moderate left pleural effusion.  Per patient he did had multiple prior pleural effusions which were being managed with diuresis in the past.  Thoracentesis was ordered but never done as COVID-19 testing results were not back on time.  No one here to do a thoracentesis over the weekend.  Significant improvement in his symptoms with IV diuresis.  Subjective: Patient was feeling little better when seen today.  He had a good night sleep after few nights of not sleeping well at night.  Wife was at bedside.  He was resting comfortably with somewhat upright position.  Assessment & Plan:   Active Problems:   Pleural effusion on left   Acute exacerbation of CHF (congestive heart failure) (HCC)  Acute on chronic combined systolic and diastolic heart failure.  Patient has EF of 40 to 45% with grade 2 diastolic dysfunction on an echo done in June 2021.  Cardiology was consulted and they are recommending continuation of diuresis. -Continue with IV Lasix 40 mg twice daily. -Continue with daily weight and BMP. -Strict intake and output  Left-sided pleural effusion.  Low risk for infectious etiology.  Has an history of prior pleural effusion which were taken care with diuresis. US guided thoracentesis was ordered but unable to done as patient did not had her Covid testing results back on time.   Repeat chest x-ray with similar findings but clinically seems responding to diuresis. -Can obtain thoracentesis on Monday after some diuresis if needed.  Elevated troponin.  Most likely secondary to demand ischemia.  Started trending down.  No chest pain.  COPD.  No wheezing.  History of DVT.  Patient was found to have a lower extremity DVT in July 2021 and was placed on Eliquis since then.  No acute concern. Eliquis was held for thoracentesis.  Can be restarted after procedure.  Diabetes mellitus with renal complications.  Prior A1c of 9.3.  Repeat A1c pending.  CBG within goal today. -Continue with Levemir 10 units at bedtime. -Continue with SSI. -We will start him on Jardiance on discharge.  CKD stage IIIb.  Creatinine seems stable. -Continue to monitor renal function. -Avoid nephrotoxins.  Hypertension.  Blood pressure within goal. -Continue carvedilol, Lasix and Imdur. -Holding home dose of amlodipine-can be restarted at a lower dose per cardiology recommendations if needed.  Objective: Vitals:   06/09/20 0500 06/09/20 0558 06/09/20 0808 06/09/20 1214  BP:  (!) 109/57 115/60 105/62  Pulse:  67 75 73  Resp:  18 18 18   Temp:  98.5 F (36.9 C) 97.8 F (36.6 C) 98.5 F (36.9 C)  TempSrc:  Oral Oral   SpO2:  94% 91% 100%  Weight: 81.1 kg     Height:        Intake/Output Summary (Last 24 hours) at 06/09/2020 1351 Last data filed at 06/09/2020 1300 Gross per 24 hour  Intake 0 ml  Output 2525 ml  Net -  2525 ml   Filed Weights   06/08/20 1108 06/09/20 0500  Weight: 73.3 kg 81.1 kg    Examination:  General exam: Appears calm and comfortable  Respiratory system: Decreased breath sounds at left base. Respiratory effort normal. Cardiovascular system: S1 & S2 heard, RRR.  Gastrointestinal system: Soft, nontender, nondistended, bowel sounds positive. Central nervous system: Alert and oriented. No focal neurological deficits. Extremities: 1+ LE edema, no cyanosis,  pulses intact and symmetrical. Psychiatry: Judgement and insight appear normal.     DVT prophylaxis: Lovenox Code Status: Full Family Communication: Wife was updated at bed last Thursday site. Disposition Plan:  Status is: Inpatient  Remains inpatient appropriate because:Inpatient level of care appropriate due to severity of illness   Dispo: The patient is from: Home              Anticipated d/c is to: Home              Anticipated d/c date is: 2 days              Patient currently is not medically stable to d/c.  Consultants:   Cardiology  Procedures:  Antimicrobials:   Data Reviewed: I have personally reviewed following labs and imaging studies  CBC: Recent Labs  Lab 06/08/20 1113  WBC 4.1  NEUTROABS 3.1  HGB 9.8*  HCT 28.6*  MCV 94.1  PLT 259*   Basic Metabolic Panel: Recent Labs  Lab 06/08/20 1113 06/09/20 0405  NA 138 143  K 4.9 4.1  CL 103 108  CO2 25 29  GLUCOSE 341* 168*  BUN 59* 59*  CREATININE 2.29* 2.26*  CALCIUM 8.8* 8.6*   GFR: Estimated Creatinine Clearance: 31.9 mL/min (A) (by C-G formula based on SCr of 2.26 mg/dL (H)). Liver Function Tests: No results for input(s): AST, ALT, ALKPHOS, BILITOT, PROT, ALBUMIN in the last 168 hours. No results for input(s): LIPASE, AMYLASE in the last 168 hours. No results for input(s): AMMONIA in the last 168 hours. Coagulation Profile: Recent Labs  Lab 06/08/20 1543  INR 1.3*   Cardiac Enzymes: No results for input(s): CKTOTAL, CKMB, CKMBINDEX, TROPONINI in the last 168 hours. BNP (last 3 results) No results for input(s): PROBNP in the last 8760 hours. HbA1C: No results for input(s): HGBA1C in the last 72 hours. CBG: Recent Labs  Lab 06/08/20 2003 06/09/20 0809 06/09/20 1213  GLUCAP 395* 70 100*   Lipid Profile: No results for input(s): CHOL, HDL, LDLCALC, TRIG, CHOLHDL, LDLDIRECT in the last 72 hours. Thyroid Function Tests: No results for input(s): TSH, T4TOTAL, FREET4, T3FREE,  THYROIDAB in the last 72 hours. Anemia Panel: No results for input(s): VITAMINB12, FOLATE, FERRITIN, TIBC, IRON, RETICCTPCT in the last 72 hours. Sepsis Labs: No results for input(s): PROCALCITON, LATICACIDVEN in the last 168 hours.  Recent Results (from the past 240 hour(s))  Respiratory Panel by RT PCR (Flu A&B, Covid) - Nasopharyngeal Swab     Status: None   Collection Time: 06/08/20  3:43 PM   Specimen: Nasopharyngeal Swab  Result Value Ref Range Status   SARS Coronavirus 2 by RT PCR NEGATIVE NEGATIVE Final    Comment: (NOTE) SARS-CoV-2 target nucleic acids are NOT DETECTED.  The SARS-CoV-2 RNA is generally detectable in upper respiratoy specimens during the acute phase of infection. The lowest concentration of SARS-CoV-2 viral copies this assay can detect is 131 copies/mL. A negative result does not preclude SARS-Cov-2 infection and should not be used as the sole basis for treatment or other patient management decisions.  A negative result may occur with  improper specimen collection/handling, submission of specimen other than nasopharyngeal swab, presence of viral mutation(s) within the areas targeted by this assay, and inadequate number of viral copies (<131 copies/mL). A negative result must be combined with clinical observations, patient history, and epidemiological information. The expected result is Negative.  Fact Sheet for Patients:  PinkCheek.be  Fact Sheet for Healthcare Providers:  GravelBags.it  This test is no t yet approved or cleared by the [place] FDA and  has been authorized for detection and/or diagnosis of SARS-CoV-2 by FDA under an Emergency Use Authorization (EUA). This EUA will remain  in effect (meaning this test can be used) for the duration of the COVID-19 declaration under Section 564(b)(1) of the Act, 21 U.S.C. section 360bbb-3(b)(1), unless the authorization is terminated or revoked  sooner.     Influenza A by PCR NEGATIVE NEGATIVE Final   Influenza B by PCR NEGATIVE NEGATIVE Final    Comment: (NOTE) The Xpert Xpress SARS-CoV-2/FLU/RSV assay is intended as an aid in  the diagnosis of influenza from Nasopharyngeal swab specimens and  should not be used as a sole basis for treatment. Nasal washings and  aspirates are unacceptable for Xpert Xpress SARS-CoV-2/FLU/RSV  testing.  Fact Sheet for Patients: PinkCheek.be  Fact Sheet for Healthcare Providers: GravelBags.it  This test is not yet approved or cleared by the [place] FDA and  has been authorized for detection and/or diagnosis of SARS-CoV-2 by  FDA under an Emergency Use Authorization (EUA). This EUA will remain  in effect (meaning this test can be used) for the duration of the  Covid-19 declaration under Section 564(b)(1) of the Act, 21  U.S.C. section 360bbb-3(b)(1), unless the authorization is  terminated or revoked. Performed at [facility], [address], [place], [place] [address]      Radiology Studies: DG Chest 2 View  Result Date: 06/09/2020 CLINICAL DATA:  Left pleural effusion. 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 Stable moderate left pleural effusion with associated left basilar atelectasis. Probable tiny amount of residual right pleural fluid. Pulmonary venous hypertension present without overt airspace edema. No pneumothorax. IMPRESSION: Stable moderate left pleural effusion and associated left basilar atelectasis. Probable tiny amount of residual right pleural fluid. Electronically Signed   By: Aletta Edouard M.D.   On: 06/09/2020 12:07   DG Chest 2 View  Result Date: 06/08/2020 CLINICAL DATA:  Short of breath.  Fluid in lungs. EXAM: CHEST - 2 VIEW COMPARISON:  02/28/2020 FINDINGS: Postop CABG. Negative for vascular congestion. Moderate left effusion unchanged with  left lower lobe atelectasis. Interval improvement in right effusion and right lower lobe atelectasis since the prior study. No pulmonary edema. IMPRESSION: Moderate left effusion and left lower lobe atelectasis unchanged. Improved aeration right lung base. Negative for edema. Electronically Signed   By: Franchot Gallo M.D.   On: 06/08/2020 11:42    Scheduled Meds: . aspirin EC  81 mg Oral Daily  . carvedilol  6.25 mg Oral BID WC  . ferrous sulfate  325 mg Oral BID WC  . furosemide  40 mg Intravenous BID  . insulin aspart  0-5 Units Subcutaneous QHS  . insulin aspart  0-9 Units Subcutaneous TID WC  . insulin detemir  10 Units Subcutaneous QHS  . isosorbide mononitrate  30 mg Oral Daily  . sodium chloride flush  3 mL Intravenous Q12H   Continuous Infusions: . sodium chloride  LOS: 1 day   Time spent: 35 minutes.  Lorella Nimrod, MD [facility]  If 7PM-7AM, please contact night-coverage [email or web address]  06/09/2020, 1:51 PM   This record has been created using Systems analyst. Errors have been sought and corrected,but may not always be located. Such creation errors do not reflect on the standard of care.

## 2020-06-09 NOTE — Progress Notes (Signed)
Left eye is noted with red blood vessel eruption. Pt states this occurred after I woke up a few days ago. Pt reports he had a headache all that day that was unrelieved and when he woke up, his eye was in the current condition. No visual defects are reported.

## 2020-06-09 NOTE — Consult Note (Signed)
[facility] Consultation Note  Patient ID: Rick Zuniga, MRN: [id number], DOB/AGE: 01/10/1946 74 y.o. Admit date: 06/08/2020   Date of Consult: 06/09/2020 Primary Physician: [name], MD Primary Cardiologist: [place]  Chief Complaint:  Chief Complaint  Patient presents with  . Shortness of Breath   Reason for Consult: Heart failure  HPI: 74 y.o. male with known coronary artery disease status post coronary bypass graft previous myocardial infarction with previous echocardiogram showing mild segmental LV systolic dysfunction with ejection fraction of 50%.  The patient has had chronic kidney disease stage III with a glomerular filtration rate of 30 and chronic anemia with a hemoglobin of 9.8.  The patient has had waxing and waning issues with lower extremity edema pulmonary edema and shortness of breath.  This has been tailored and treated with furosemide although changes in furosemide to torsemide have caused some significant issues with dehydration and hypotension.  The patient then had recent issues with worsening lower extremity edema pulmonary edema by chest x-ray and with BNP of 149.  After intravenous diuretics last night the patient does have a significant improvements of shortness of breath.  There is continued lower extremity edema.  The patient has had no evidence of myocardial infarction with a flat troponin of 148, 168.  No evidence of acute coronary syndrome.  Currently there is no evidence of anginal symptoms today  Past Medical History:  Diagnosis Date  . Arthritis   . CHF (congestive heart failure) ([place])   . Chronic kidney disease   . COPD (chronic obstructive pulmonary disease) ([place])   . Diabetes ([place])   . Heart disease   . Hypertension   . Indigestion   . Thrombocytopenia ([place])   . Wears dentures    full upper and lower      Surgical History:  Past Surgical History:  Procedure Laterality Date  . APPENDECTOMY    . CARDIAC SURGERY      bypass  . CHOLECYSTECTOMY    . COLONOSCOPY  12/23/2010     Home Meds: Prior to Admission medications   Medication Sig Start Date End Date Taking? Authorizing Provider  amLODipine (NORVASC) 10 MG tablet Take 1 tablet (10 mg total) by mouth daily. Patient taking differently: Take 5 mg by mouth daily.  05/26/19 06/08/20 Yes Vaughan Basta, MD  apixaban (ELIQUIS) 5 MG TABS tablet Take 1 tablet (5 mg total) by mouth 2 (two) times daily. 02/16/20  Yes Nicole Kindred A, DO  aspirin EC 81 MG tablet Take 81 mg by mouth daily.    Yes [provider]  carvedilol (COREG) 6.25 MG tablet Take 6.25 mg by mouth 2 (two) times daily with a meal.   Yes [provider]  glipiZIDE (GLUCOTROL) 10 MG tablet Take 10 mg by mouth 2 (two) times daily.    Yes [provider]  insulin NPH-regular Human (NOVOLIN 70/30) (70-30) 100 UNIT/ML injection Inject 15 Units into the skin at bedtime.    Yes [provider]  isosorbide mononitrate (IMDUR) 30 MG 24 hr tablet Take 30 mg by mouth daily.  08/19/19  Yes [provider]  meclizine (ANTIVERT) 25 MG tablet Take 1 tablet (25 mg total) by mouth 3 (three) times daily as needed for dizziness. 03/02/20  Yes Sharen Hones, MD  nitroGLYCERIN (NITROSTAT) 0.4 MG SL tablet Place 0.4 mg under the tongue every 5 (five) minutes as needed for chest pain.   Yes [provider]  torsemide (DEMADEX) 20 MG tablet Take 20  mg by mouth 2 (two) times daily.  12/05/19  Yes [provider]    Inpatient Medications:  . amLODipine  10 mg Oral Daily  . aspirin EC  81 mg Oral Daily  . carvedilol  6.25 mg Oral BID WC  . ferrous sulfate  325 mg Oral BID WC  . furosemide  40 mg Intravenous BID  . insulin aspart  0-5 Units Subcutaneous QHS  . insulin aspart  0-9 Units Subcutaneous TID WC  . insulin detemir  10 Units Subcutaneous QHS  . isosorbide mononitrate  30 mg Oral Daily  . sodium chloride flush  3 mL Intravenous Q12H   . sodium  chloride      Allergies:  Allergies  Allergen Reactions  . Pioglitazone Swelling  . Metformin And Related Diarrhea  . Other     Pt reported that he is allergic to Ace wrap.  Reaction include severe skin irritation.  . Atorvastatin Rash  . Benazepril Rash  . Latex Rash    (tape only)  . Tape Rash    Social History   Socioeconomic History  . Marital status: Single    Spouse name: Not on file  . Number of children: Not on file  . Years of education: Not on file  . Highest education level: Not on file  Occupational History  . Not on file  Tobacco Use  . Smoking status: Never Smoker  . Smokeless tobacco: Never Used  Vaping Use  . Vaping Use: Never used  Substance and Sexual Activity  . Alcohol use: No  . Drug use: No  . Sexual activity: Not on file  Other Topics Concern  . Not on file  Social History Narrative  . Not on file   Social Determinants of Health   Financial Resource Strain:   . Difficulty of Paying Living Expenses: Not on file  Food Insecurity:   . Worried About Charity fundraiser in the Last Year: Not on file  . Ran Out of Food in the Last Year: Not on file  Transportation Needs:   . Lack of Transportation (Medical): Not on file  . Lack of Transportation (Non-Medical): Not on file  Physical Activity:   . Days of Exercise per Week: Not on file  . Minutes of Exercise per Session: Not on file  Stress:   . Feeling of Stress : Not on file  Social Connections:   . Frequency of Communication with Friends and Family: Not on file  . Frequency of Social Gatherings with Friends and Family: Not on file  . Attends Religious Services: Not on file  . Active Member of Clubs or Organizations: Not on file  . Attends Archivist Meetings: Not on file  . Marital Status: Not on file  Intimate Partner Violence:   . Fear of Current or Ex-Partner: Not on file  . Emotionally Abused: Not on file  . Physically Abused: Not on file  . Sexually Abused: Not on  file     Family History  Problem Relation Age of Onset  . CAD Mother   . Kidney failure Father   . Diabetes Father      Review of Systems Positive for shortness of breath edema PND orthopnea Negative for: General:  chills, fever, night sweats or weight changes.  Cardiovascular: Positive for PND orthopnea negative for syncope dizziness  Dermatological skin lesions rashes Respiratory: Cough congestion Urologic: Frequent urination urination at night and hematuria Abdominal: negative for nausea, vomiting, diarrhea, bright red  blood per rectum, melena, or hematemesis Neurologic: negative for visual changes, and/or hearing changes  All other systems reviewed and are otherwise negative except as noted above.  Labs: No results for input(s): CKTOTAL, CKMB, TROPONINI in the last 72 hours. Lab Results  Component Value Date   WBC 4.1 06/08/2020   HGB 9.8 (L) 06/08/2020   HCT 28.6 (L) 06/08/2020   MCV 94.1 06/08/2020   PLT 138 (L) 06/08/2020    Recent Labs  Lab 06/09/20 0405  NA 143  K 4.1  CL 108  CO2 29  BUN 59*  CREATININE 2.26*  CALCIUM 8.6*  GLUCOSE 168*   Lab Results  Component Value Date   CHOL 149 05/26/2019   HDL 38 (L) 05/26/2019   LDLCALC 55 05/26/2019   TRIG 282 (H) 05/26/2019   No results found for: DDIMER  Radiology/Studies:  DG Chest 2 View  Result Date: 06/08/2020 CLINICAL DATA:  Short of breath.  Fluid in lungs. EXAM: CHEST - 2 VIEW COMPARISON:  02/28/2020 FINDINGS: Postop CABG. Negative for vascular congestion. Moderate left effusion unchanged with left lower lobe atelectasis. Interval improvement in right effusion and right lower lobe atelectasis since the prior study. No pulmonary edema. IMPRESSION: Moderate left effusion and left lower lobe atelectasis unchanged. Improved aeration right lung base. Negative for edema. Electronically Signed   By: Franchot Gallo M.D.   On: 06/08/2020 11:42      Weights: Filed Weights   06/08/20 1108 06/09/20 0500   Weight: 73.3 kg 81.1 kg     Physical Exam: Blood pressure (!) 109/57, pulse 67, temperature 98.5 F (36.9 C), temperature source Oral, resp. rate 18, height 6\' 1"  (1.854 m), weight 81.1 kg, SpO2 94 %. Body mass index is 23.59 kg/m. General: Well developed, well nourished, in no acute distress. Head eyes ears nose throat: Normocephalic, atraumatic, sclera non-icteric, no xanthomas, nares are without discharge. No apparent thyromegaly and/or mass  Lungs: Normal respiratory effort.  Few wheezes, basilar rales, no rhonchi decreased left base.  Heart: RRR with normal S1 S2. no murmur gallop, no rub, PMI is normal size and placement, carotid upstroke normal without bruit, jugular venous pressure is normal Abdomen: Soft, non-tender, non-distended with normoactive bowel sounds. No hepatomegaly. No rebound/guarding. No obvious abdominal masses. Abdominal aorta is normal size without bruit Extremities: 1+ edema. no cyanosis, no clubbing, no ulcers  Peripheral : 2+ bilateral upper extremity pulses, 2+ bilateral femoral pulses, 2+ bilateral dorsal pedal pulse Neuro: Alert and oriented. No facial asymmetry. No focal deficit. Moves all extremities spontaneously. Musculoskeletal: Normal muscle tone without kyphosis Psych:  Responds to questions appropriately with a normal affect.    Assessment: 74 year old male with acute on chronic systolic dysfunction congestive heart failure multifactorial in nature including anemia LV dysfunction chronic kidney disease and possible dietary issues without evidence of acute coronary syndrome  Plan: 1.  Continue intravenous Lasix at 40 mg injection twice per day which appears to be helping with no current evidence of worsening kidney injury 2.  Continue carvedilol for cardiomyopathy and amlodipine for hypertension control but will consider decrease amlodipine to 5 mg 3.  No further cardiac diagnostics necessary at this time 4.  Further consideration of thoracentesis  of left pleural effusion depending on improvements as per above 5.  Begin ambulation and follow-up for improvements of symptoms  Signed, Corey Skains M.D. [facility] 06/09/2020, 7:50 AM

## 2020-06-09 NOTE — Consult Note (Signed)
ANTICOAGULATION CONSULT NOTE - Initial Consult  Pharmacy Consult for Lovenox Indication: VTE prophylaxis  Allergies  Allergen Reactions  . Pioglitazone Swelling  . Metformin And Related Diarrhea  . Other     Pt reported that he is allergic to Ace wrap.  Reaction include severe skin irritation.  . Atorvastatin Rash  . Benazepril Rash  . Latex Rash    (tape only)  . Tape Rash    Patient Measurements: Height: 6\' 1"  (185.4 cm) Weight: 81.1 kg (178 lb 12.7 oz) IBW/kg (Calculated) : 79.9  Vital Signs: Temp: 98.5 F (36.9 C) (10/23 1214) Temp Source: Oral (10/23 0808) BP: 105/62 (10/23 1214) Pulse Rate: 73 (10/23 1214)  Labs: Recent Labs    06/08/20 1113 06/08/20 1434 06/08/20 1543 06/09/20 0405  HGB 9.8*  --   --   --   HCT 28.6*  --   --   --   PLT 138*  --   --   --   LABPROT  --   --  16.0*  --   INR  --   --  1.3*  --   CREATININE 2.29*  --   --  2.26*  TROPONINIHS 168* 148*  --   --     Estimated Creatinine Clearance: 31.9 mL/min (A) (by C-G formula based on SCr of 2.26 mg/dL (H)).   Medical History: Past Medical History:  Diagnosis Date  . Arthritis   . CHF (congestive heart failure) ([place])   . Chronic kidney disease   . COPD (chronic obstructive pulmonary disease) ([place])   . Diabetes ([place])   . Heart disease   . Hypertension   . Indigestion   . Thrombocytopenia ([place])   . Wears dentures    full upper and lower    Medications:  Medications Prior to Admission  Medication Sig Dispense Refill Last Dose  . amLODipine (NORVASC) 10 MG tablet Take 1 tablet (10 mg total) by mouth daily. (Patient taking differently: Take 5 mg by mouth daily. ) 30 tablet 0 06/07/2020 at 0900  . apixaban (ELIQUIS) 5 MG TABS tablet Take 1 tablet (5 mg total) by mouth 2 (two) times daily. 60 tablet 1 06/07/2020 at 1900  . aspirin EC 81 MG tablet Take 81 mg by mouth daily.    06/07/2020 at 0900  . carvedilol (COREG) 6.25 MG tablet Take 6.25 mg by mouth 2 (two) times daily with a  meal.   06/07/2020 at 0900  . glipiZIDE (GLUCOTROL) 10 MG tablet Take 10 mg by mouth 2 (two) times daily.    06/07/2020 at 1900  . insulin NPH-regular Human (NOVOLIN 70/30) (70-30) 100 UNIT/ML injection Inject 15 Units into the skin at bedtime.    06/06/2020 at 2100  . isosorbide mononitrate (IMDUR) 30 MG 24 hr tablet Take 30 mg by mouth daily.    06/07/2020 at 0900  . meclizine (ANTIVERT) 25 MG tablet Take 1 tablet (25 mg total) by mouth 3 (three) times daily as needed for dizziness. 30 tablet 0 prn at prn  . nitroGLYCERIN (NITROSTAT) 0.4 MG SL tablet Place 0.4 mg under the tongue every 5 (five) minutes as needed for chest pain.   prn at prn  . torsemide (DEMADEX) 20 MG tablet Take 20 mg by mouth 2 (two) times daily.    06/07/2020 at 0900    Assessment: 74 year old male presenting with worsening shortness of breath - acute on chronic combined systolic and diastolic heart failure. PMH includes COPD, HFrEF, recurrent left-sided pleural effusion,  diabetes, CKD, hypertension, and DVT. Pt was on apixaban 5 mg BID prior to admission for history of DVT July 2021. Pharmacy consulted to dose Lovenox.   Goal of Therapy:  Monitor platelets by anticoagulation protocol: Yes   Plan:  Lovenox 80 mg q12h Monitor CrCl since borderline for dose adjustment   Benn Moulder, PharmD Pharmacy Resident  06/09/2020 2:36 PM

## 2020-06-09 NOTE — TOC Initial Note (Signed)
Transition of Care [facility]) - Initial/Assessment Note    Patient Details  Name: Rick Zuniga MRN: [id number] Date of Birth: 26-Jun-1946  Transition of Care [facility]) CM/SW Contact:    Shelbie Hutching, RN Phone Number: 06/09/2020, 11:07 AM  Clinical Narrative:                 Patient admitted with CHF exacerbation.  Patient is from home in [place], patient lives alone but his significant other, Letta Median stays with him sometimes and he stays at her house sometimes.  Patient is very independent at home, mows his yard, drives his tractors, requires not assistive devices.  Patient does have a walker but does not use it.  Patient is current with PCP, cardiology, nephrology, and pulmonology outpatient.  Patient declines home health services at discharge.    Expected Discharge Plan: Home/Self Care Barriers to Discharge: Continued Medical Work up   Patient Goals and CMS Choice Patient states their goals for this hospitalization and ongoing recovery are:: to feel better and go home      Expected Discharge Plan and Services Expected Discharge Plan: Home/Self Care   Discharge Planning Services: CM Consult   Living arrangements for the past 2 months: Single Family Home                   DME Agency: NA       HH Arranged: Refused HH          Prior Living Arrangements/Services Living arrangements for the past 2 months: Single Family Home Lives with:: Self Patient language and need for interpreter reviewed:: Yes Do you feel safe going back to the place where you live?: Yes      Need for Family Participation in Patient Care: Yes (Comment) (CHF) Care giver support system in place?: Yes (comment) (significant other) Current home services: DME (walker) Criminal Activity/Legal Involvement Pertinent to Current Situation/Hospitalization: No - Comment as needed  Activities of Daily Living Home Assistive Devices/Equipment: None ADL Screening (condition at time of admission) Patient's  cognitive ability adequate to safely complete daily activities?: Yes Is the patient deaf or have difficulty hearing?: No Does the patient have difficulty seeing, even when wearing glasses/contacts?: No Does the patient have difficulty concentrating, remembering, or making decisions?: No Patient able to express need for assistance with ADLs?: No Does the patient have difficulty dressing or bathing?: No Independently performs ADLs?: Yes (appropriate for developmental age) Does the patient have difficulty walking or climbing stairs?: No Weakness of Legs: None Weakness of Arms/Hands: None  Permission Sought/Granted Permission sought to share information with : Case Manager, Family Supports Permission granted to share information with : Yes, Verbal Permission Granted  Share Information with NAME: Ileana Roup     Permission granted to share info w Relationship: significant other     Emotional Assessment Appearance:: Appears stated age Attitude/Demeanor/Rapport: Engaged Affect (typically observed): Accepting Orientation: : Oriented to Self, Oriented to Place, Oriented to  Time, Oriented to Situation Alcohol / Substance Use: Not Applicable Psych Involvement: No (comment)  Admission diagnosis:  Elevated troponin [R77.8] Acute exacerbation of CHF (congestive heart failure) (HCC) [I50.9] Pleural effusion on left [J90] Acute on chronic systolic congestive heart failure (HCC) [I50.23] Acute on chronic combined systolic and diastolic CHF (congestive heart failure) ([place]) [I50.43] Patient Active Problem List   Diagnosis Date Noted  . Acute exacerbation of CHF (congestive heart failure) ([place]) 06/08/2020  . Elevated troponin   . Acute on chronic combined systolic and diastolic CHF (congestive  heart failure) ([place]) 02/29/2020  . SOB (shortness of breath) 02/28/2020  . Acute kidney injury superimposed on CKD ([employer]) 02/08/2020  . 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 Nocturnal leg cramps  12/21/2019  . Pleural effusion on left 12/08/2019  . Shortness of breath 12/08/2019  . Chest pain 05/25/2019  . Esophageal dysphagia 05/09/2019  . Functional diarrhea 05/09/2019  . RUQ pain 05/09/2019  . Other male erectile dysfunction 11/11/2018  . Lumbar radiculitis 10/17/2018  . Pedal edema 12/14/2017  . Vertical diplopia 11/02/2017  . Accelerated hypertension 10/27/2017  . Thrombocytopenia ([place]) 02/28/2016  . Anemia 02/28/2016  . Hyperlipidemia 10/23/2014  . CAD (coronary artery disease) 02/22/2014  . Hypertension associated with diabetes ([place]) 02/22/2014  . Type 2 diabetes mellitus with microalbuminuria, with long-term current use of insulin ([place]) 02/22/2014   PCP:  [name], MD Pharmacy:   [facility] [address] [place] [address] [place] [address] Phone: [phone] Fax: [phone]     Social Determinants of Health (SDOH) Interventions    Readmission Risk Interventions Readmission Risk Prevention Plan 06/09/2020  Transportation Screening Complete  PCP or Specialist Appt within 3-5 Days Complete  HRI or [place] Patient refused  Social Work Consult for [place] Planning/Counseling Complete  Palliative Care Screening Not Applicable  Medication Review Press photographer) Complete  Some recent data might be hidden

## 2020-06-09 NOTE — Plan of Care (Signed)

## 2020-06-09 NOTE — Evaluation (Signed)
Physical Therapy Evaluation Patient Details Name: Rick Zuniga MRN: [id number] DOB: [birth year] Today's Date: 06/09/2020   History of Present Illness  Rick Zuniga is a 74 y.o. male with medical history significant of COPD, HFrEF with EF of 40 to 45%, recurrent left-sided pleural effusion, diabetes, CKD, hypertension, DVT on Eliquis and hyperlipidemia was sent to ED from his pulmonologist office with worsening shortness of breath. Patient admitted with CHF exacerbation.  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  Clinical Impression  Pt is able to demonstrate bed mobility ind, STS modI with UE use, and amb 213ft without AD supervision. Patient ambulates with narrow base of support, decreased step length and floot clearance bilat. While ambulating patietn is unable to complete head turns, but does return to midline and maintain balance ind with veering and stepping strategies. Patient is unable to complete SLS or tandem/semi tandem. PT suggested HHPT to pt to address high level balance impairments demonstrated to decrease fall risk with community activity, patient declined. Pts fiance also expressed that it is her wish the patient does not have further PT during his stay as he does not "need it". PT explained role in health care continuum; pt and fiance verbalize understanding.     Follow Up Recommendations Home health PT    Equipment Recommendations  Rolling walker with 5" wheels    Recommendations for Other Services       Precautions / Restrictions        Mobility  Bed Mobility Overal bed mobility: Independent                  Transfers Overall transfer level: Modified independent               General transfer comment: increased time, UE for STS  Ambulation/Gait Ambulation/Gait  assistance: Modified independent (Device/Increase time);Supervision Gait Distance (Feet): 200 Feet   Gait Pattern/deviations: Decreased step length - right;Decreased step length - left;Narrow base of support Gait velocity: decreased   General Gait Details: bilat decreased foot clearance; able to complete head turns with gait with some veering, unable to maintain head turns to either direction; but does maintain balance  Stairs            Wheelchair Mobility    Modified Rankin (Stroke Patients Only)       Balance Overall balance assessment: Needs assistance Sitting-balance support: No upper extremity supported Sitting balance-Leahy Scale: Good     Standing balance support: No upper extremity supported Standing balance-Leahy Scale: Good Standing balance comment: able to complete rhomberg eyes open and closed with increased hip/ankle strategy without vision; unable to complete tandem or SLS for any period of time Single Leg Stance - Right Leg: 0 Single Leg Stance - Left Leg: 0 Tandem Stance - Right Leg: 0 Tandem Stance - Left Leg: 0 Rhomberg - Eyes Opened: 30 Rhomberg - Eyes Closed: 30                 Pertinent Vitals/Pain      Home Living Family/patient expects to be discharged to:: Private residence Living Arrangements: Spouse/significant other Available Help at Discharge: Family;Available 24 hours/day Type of Home: House Home Access: Stairs to enter Entrance Stairs-Rails: Left Entrance Stairs-Number of Steps: 4 Home Layout: One level Home Equipment: Walker - 2 wheels      Prior Function  Level of Independence: Independent         Comments: Ind Amb without an AD with only occasional RW use PRN, community ambulator, no fall history, Ind with ADLs, drives, operates Orthoptist        Extremity/Trunk Assessment   Upper Extremity Assessment Upper Extremity Assessment: Overall WFL for tasks assessed    Lower Extremity  Assessment Lower Extremity Assessment: Generalized weakness    Cervical / Trunk Assessment Cervical / Trunk Assessment: Kyphotic  Communication   Communication: No difficulties  Cognition Arousal/Alertness: Awake/alert Behavior During Therapy: WFL for tasks assessed/performed Overall Cognitive Status: Within Functional Limits for tasks assessed                                        General Comments      Exercises Other Exercises Other Exercises: patient able to complete bed mobility ind; STS supervision with UEs needed; supervision for amb 253ft, unable to complete head turns, though he is able to maintain balance Other Exercises: static balance activity (see above) Other Exercises: Education on HHPT services and PT role in healthcare continuum with opportunity to work on impairments to decrease fall risk   Assessment/Plan    PT Assessment Patient needs continued PT services  PT Problem List Decreased strength;Decreased mobility;Decreased coordination;Decreased activity tolerance;Decreased balance;Pain       PT Treatment Interventions DME instruction;Therapeutic activities;Gait training;Therapeutic exercise;Stair training;Balance training;Functional mobility training;Neuromuscular re-education;Manual techniques;Patient/family education    PT Goals (Current goals can be found in the Care Plan section)  Acute Rehab PT Goals Patient Stated Goal: go home PT Goal Formulation: With patient Time For Goal Achievement: 06/23/20    Frequency Other (Comment) (does not wish to be picked up on caseload)   Barriers to discharge        Co-evaluation               AM-PAC PT "6 Clicks" Mobility  Outcome Measure Help needed turning from your back to your side while in a flat bed without using bedrails?: None Help needed moving from lying on your back to sitting on the side of a flat bed without using bedrails?: None Help needed moving to and from a bed to a  chair (including a wheelchair)?: A Little Help needed standing up from a chair using your arms (e.g., wheelchair or bedside chair)?: A Little Help needed to walk in hospital room?: A Little Help needed climbing 3-5 steps with a railing? : A Little 6 Click Score: 20    End of Session Equipment Utilized During Treatment: Gait belt Activity Tolerance: Patient tolerated treatment well Patient left: in bed;with call bell/phone within reach;with family/visitor present   PT Visit Diagnosis: Unsteadiness on feet (R26.81);Muscle weakness (generalized) (M62.81);Repeated falls (R29.6);Other abnormalities of gait and mobility (R26.89);Difficulty in walking, not elsewhere classified (R26.2);History of falling (Z91.81)    Time: 1415-1430 PT Time Calculation (min) (ACUTE ONLY): 15 min   Charges:   PT Evaluation $PT Eval Moderate Complexity: 1 Mod PT Treatments $Therapeutic Activity: 8-22 mins      Durwin Reges DPT  Durwin Reges 06/09/2020, 2:53 PM

## 2020-06-10 ENCOUNTER — Inpatient Hospital Stay: Payer: Medicare Other

## 2020-06-10 DIAGNOSIS — R778 Other specified abnormalities of plasma proteins: Secondary | ICD-10-CM | POA: Diagnosis not present

## 2020-06-10 DIAGNOSIS — I5023 Acute on chronic systolic (congestive) heart failure: Secondary | ICD-10-CM | POA: Diagnosis not present

## 2020-06-10 DIAGNOSIS — J90 Pleural effusion, not elsewhere classified: Secondary | ICD-10-CM | POA: Diagnosis not present

## 2020-06-10 LAB — GLUCOSE, CAPILLARY
Glucose-Capillary: 148 mg/dL — ABNORMAL HIGH (ref 70–99)
Glucose-Capillary: 154 mg/dL — ABNORMAL HIGH (ref 70–99)

## 2020-06-10 LAB — BODY FLUID CELL COUNT WITH DIFFERENTIAL
Eos, Fluid: 0 %
Lymphs, Fluid: 36 %
Monocyte-Macrophage-Serous Fluid: 49 %
Neutrophil Count, Fluid: 15 %
Total Nucleated Cell Count, Fluid: 277 cu mm

## 2020-06-10 LAB — CBC
HCT: 27.8 % — ABNORMAL LOW (ref 39.0–52.0)
Hemoglobin: 9.3 g/dL — ABNORMAL LOW (ref 13.0–17.0)
MCH: 32 pg (ref 26.0–34.0)
MCHC: 33.5 g/dL (ref 30.0–36.0)
MCV: 95.5 fL (ref 80.0–100.0)
Platelets: 126 10*3/uL — ABNORMAL LOW (ref 150–400)
RBC: 2.91 MIL/uL — ABNORMAL LOW (ref 4.22–5.81)
RDW: 13.3 % (ref 11.5–15.5)
WBC: 3.6 10*3/uL — ABNORMAL LOW (ref 4.0–10.5)
nRBC: 0 % (ref 0.0–0.2)

## 2020-06-10 LAB — LACTATE DEHYDROGENASE, PLEURAL OR PERITONEAL FLUID: LD, Fluid: 49 U/L — ABNORMAL HIGH (ref 3–23)

## 2020-06-10 LAB — GLUCOSE, PLEURAL OR PERITONEAL FLUID: Glucose, Fluid: 188 mg/dL

## 2020-06-10 LAB — BASIC METABOLIC PANEL
Anion gap: 8 (ref 5–15)
BUN: 52 mg/dL — ABNORMAL HIGH (ref 8–23)
CO2: 28 mmol/L (ref 22–32)
Calcium: 8.7 mg/dL — ABNORMAL LOW (ref 8.9–10.3)
Chloride: 107 mmol/L (ref 98–111)
Creatinine, Ser: 2.08 mg/dL — ABNORMAL HIGH (ref 0.61–1.24)
GFR, Estimated: 33 mL/min — ABNORMAL LOW (ref >60)
Glucose, Bld: 139 mg/dL — ABNORMAL HIGH (ref 70–99)
Potassium: 3.8 mmol/L (ref 3.5–5.1)
Sodium: 143 mmol/L (ref 135–145)

## 2020-06-10 LAB — PROTEIN, PLEURAL OR PERITONEAL FLUID: Total protein, fluid: <3 g/dL

## 2020-06-10 MED ORDER — FERROUS SULFATE 325 (65 FE) MG PO TABS
325.0000 mg | ORAL_TABLET | Freq: Two times a day (BID) | ORAL | 1 refills | Status: DC
Start: 2020-06-10 — End: 2021-05-02

## 2020-06-10 NOTE — Progress Notes (Signed)
Pt refused to have further ordered labs drawn

## 2020-06-10 NOTE — Discharge Summary (Signed)
Physician Discharge Summary  Rick Zuniga:[id number] DOB: 1946-02-27 DOA: 06/08/2020  PCP: [name], MD  Admit date: 06/08/2020 Discharge date: 06/10/2020  Admitted From: Home Disposition: Home   Recommendations for Outpatient Follow-up:  1. Follow up with PCP in 1-2 weeks 2. Follow-up with cardiology within next week. 3. Please obtain BMP/CBC in one week 4. 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  According to patient he is compliant with his medication, recently increased dose of torsemide.  Patient endorses orthopnea and PND for the past couple of nights.  Chest x-ray with moderate left pleural effusion.  Per patient he did had multiple prior pleural effusions which were being managed with diuresis in the past.  COVID-19 negative.  Thoracentesis was not done at the time of admission but done on Sunday morning.  Pending lab results.  Preliminary labs are more consistent with transudate.  Patient was diuresed with IV Lasix while in the hospital.  Has EF of 40 to 45% with grade 2 diastolic dysfunction on echo done in June 2021.  Cardiology was also consulted.  Total net of more than 4 L.  Change in weight of about 7 pound.  Weight on the day of discharge was 1 7 1  pounds.  Renal function continued to get improved with diuresis.  Patient wants to go home as he was clinically feeling better and able to lay flat.  He was advised to continue taking torsemide 20 mg twice daily and have a close  follow-up with his cardiologist for further recommendations.  Patient did had mildly elevated troponin without any EKG changes on admission, most likely secondary to demand ischemia with CHF exacerbation.  Patient has uncontrolled diabetes according to prior A1c of 9.3.  He was on glipizide and 70/30 at home.  With his history of heart failure he will be better with Jardiance instead of glipizide.  We discussed and deferred at this decision to his primary care provider.  Patient COPD and stage IIIb CKD seems stable.  Patient's blood pressure remained within goal with carvedilol, Lasix and Imdur.  We discontinued home dose of amlodipine as it can also contribute to his lower extremity edema.  He will need a close monitoring by his providers and can be restarted on amlodipine or any other agent if needed.  He will continue with rest of his home meds and follow-up with his providers.  Discharge Diagnoses:  Active Problems:   Pleural effusion on left   Acute exacerbation of CHF (congestive heart failure) [facility])  Discharge Instructions  Discharge Instructions    Amb Referral to HF Clinic   Complete by: As directed    Diet - low sodium heart healthy   Complete by: As directed    Discharge instructions   Complete by: As directed    It was pleasure taking care of you. We discontinued your amlodipine as it can also contribute to your legs swelling.  Keep your legs elevated whenever resting and you can use compression stockings or Ace wrap while walking around to help reduce the swelling. Continue taking torsemide 20 mg twice a day, you can take  1 pill in the morning and second around 2 PM so you can sleep during the night. As we discussed it will be better if you start taking Jardiance instead of glipizide, you can discuss this with your primary care provider. Please follow-up with your cardiologist, pulmonologist and primary care provider.   Increase activity slowly   Complete by: As directed       Allergies as of 06/10/2020      Reactions   Pioglitazone Swelling   Metformin And Related Diarrhea   Other    Pt reported that he is allergic to Ace wrap.  Reaction include severe skin irritation.   Atorvastatin Rash   Benazepril Rash   Latex Rash   (tape only)   Tape Rash      Medication List    STOP taking these medications   amLODipine 10 MG tablet Commonly known as: NORVASC     TAKE these medications   apixaban 5 MG Tabs tablet Commonly known as: ELIQUIS Take 1 tablet (5 mg total) by mouth 2 (two) times daily.   aspirin EC 81 MG tablet Take 81 mg by mouth daily.   carvedilol 6.25 MG tablet Commonly known as: COREG Take 6.25 mg by mouth 2 (two) times daily with a meal.   ferrous sulfate 325 (65 FE) MG tablet Take 1 tablet (325 mg total) by mouth 2 (two) times daily with a meal.   glipiZIDE 10 MG tablet Commonly known as: GLUCOTROL Take 10 mg by mouth 2 (two) times daily.   insulin NPH-regular Human (70-30) 100 UNIT/ML injection Inject 15 Units into the skin at bedtime.   isosorbide mononitrate 30 MG 24 hr tablet Commonly known as: IMDUR Take 30 mg by mouth daily.   meclizine 25 MG tablet Commonly known as: ANTIVERT Take 1 tablet (25 mg total) by mouth 3 (three) times daily as needed for dizziness.   nitroGLYCERIN 0.4 MG SL tablet Commonly known as: NITROSTAT Place 0.4 mg under the tongue every 5 (five) minutes as needed for chest pain.   torsemide 20 MG tablet Commonly known as: DEMADEX Take 20 mg by mouth 2 (two) times daily.       Follow-up Information    Paraschos, Alexander, MD Follow up in 1 week(s).   Specialty: Cardiology Contact information: [facility] [place] [address] [phone]        Brandon Follow up on 06/20/2020.   Specialty: Cardiology Why: at 11:00am. Enter through the [place] entrance Contact information: [place]       [name], MD. Schedule an appointment as soon as possible for a visit.   Specialty: Internal Medicine Contact information: [place] [address] [phone]        Ottie Glazier, MD. Schedule an appointment as soon as possible for a visit.   Specialty: Pulmonary Disease Contact information: [address] [place] [address] [phone]              Allergies  Allergen Reactions  . Pioglitazone Swelling  . Metformin And Related Diarrhea  . Other     Pt reported that he is allergic to Ace wrap.  Reaction include severe skin irritation.  . Atorvastatin Rash  . Benazepril Rash  . Latex Rash    (tape only)  . Tape Rash    Consultations:  Cardiology  Procedures/Studies: DG Chest 2  View  Result Date: 06/09/2020 CLINICAL DATA:  Left pleural effusion. 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 Stable moderate left pleural effusion with associated left basilar atelectasis. Probable tiny amount of residual right pleural fluid. Pulmonary venous hypertension present without overt airspace edema. No pneumothorax. IMPRESSION: Stable moderate left pleural effusion and associated left basilar atelectasis. Probable tiny amount of residual right pleural fluid. Electronically Signed   By: Aletta Edouard M.D.   On: 06/09/2020 12:07   DG Chest 2 View  Result Date: 06/08/2020 CLINICAL DATA:  Short of breath.  Fluid in lungs. EXAM: CHEST - 2 VIEW COMPARISON:  02/28/2020 FINDINGS: Postop CABG. Negative for vascular congestion. Moderate left effusion unchanged with left lower lobe atelectasis. Interval improvement in right effusion and right lower lobe atelectasis since the prior study. No pulmonary edema. IMPRESSION: Moderate left effusion and left lower lobe atelectasis unchanged. Improved aeration right lung base. Negative  for edema. Electronically Signed   By: Franchot Gallo M.D.   On: 06/08/2020 11:42   DG Chest Port 1 View  Result Date: 06/10/2020 CLINICAL DATA:  Status post thoracentesis. EXAM: PORTABLE CHEST 1 VIEW COMPARISON:  June 09, 2020. FINDINGS: Stable cardiomediastinal silhouette. No pneumothorax is noted. Right lung is clear. Left pleural effusion is significantly smaller status post thoracentesis. Bony thorax is unremarkable. IMPRESSION: Left pleural effusion is significantly smaller status post thoracentesis. No pneumothorax is noted. Electronically Signed   By: Marijo Conception M.D.   On: 06/10/2020 11:41   US THORACENTESIS ASP PLEURAL SPACE W/IMG GUIDE  Result Date: 06/10/2020 INDICATION: Patient with history of COPD, CHF, diabetes, chronic kidney disease, hypertension, dyspnea, left pleural effusion; request received for diagnostic and therapeutic left thoracentesis. EXAM: ULTRASOUND GUIDED DIAGNOSTIC AND THERAPEUTIC LEFT THORACENTESIS MEDICATIONS: 1% lidocaine to skin and subcutaneous tissue COMPLICATIONS: None immediate.  No pneumothorax on follow-up chest radiograph. PROCEDURE: An ultrasound guided thoracentesis was thoroughly discussed with the patient and questions answered. The benefits, risks, alternatives and complications were also discussed. The patient understands and wishes to proceed with the procedure. Written consent was obtained. Ultrasound was performed to localize and mark an adequate pocket of fluid in the left chest. The area was then prepped and draped in the normal sterile fashion. 1% Lidocaine was used for local anesthesia. Under ultrasound guidance a 6 Fr Safe-T-Centesis catheter was introduced. Thoracentesis was performed. The catheter was removed and a dressing applied. FINDINGS: A total of approximately 1.7 liters of amber fluid was removed. Samples were sent to the laboratory as requested by the clinical team. IMPRESSION: Successful ultrasound guided diagnostic and therapeutic  left thoracentesis yielding 1.7 liters of pleural fluid. Read by: Lindaann Pascal Electronically Signed   By: Lucrezia Europe M.D.   On: 06/10/2020 11:04     Subjective: Patient was feeling much better when seen today.  Going for thoracentesis and asking to go home after that. Talked with patient and wife on phone after thoracentesis-he tolerated the procedure well and would like to be discharge.  Discharge Exam: Vitals:   06/10/20 0712 06/10/20 1207  BP: 117/67 127/74  Pulse: 73 67  Resp: 18 16  Temp: 98.2 F (36.8 C) 97.7 F (36.5 C)  SpO2: 98% 100%   Vitals:   06/10/20 0442 06/10/20 0503 06/10/20 0712 06/10/20 1207  BP:   117/67 127/74  Pulse: 78  73 67  Resp:   18 16  Temp:   98.2 F (36.8 C) 97.7 F (36.5 C)  TempSrc:  Oral Oral  SpO2:   98% 100%  Weight:  77.6 kg    Height:        General: Pt is alert, awake, not in acute distress Cardiovascular: RRR, S1/S2 +, no rubs, no gallops Respiratory: Mildly decreased breath sounds at left base, no wheezing, no rhonchi Abdominal: Soft, NT, ND, bowel sounds + Extremities: 2+ LE edema, no cyanosis   The results of significant diagnostics from this hospitalization (including imaging, microbiology, ancillary and laboratory) are listed below for reference.    Microbiology: Recent Results (from the past 240 hour(s))  Respiratory Panel by RT PCR (Flu A&B, Covid) - Nasopharyngeal Swab     Status: None   Collection Time: 06/08/20  3:43 PM   Specimen: Nasopharyngeal Swab  Result Value Ref Range Status   SARS Coronavirus 2 by RT PCR NEGATIVE NEGATIVE Final    Comment: (NOTE) SARS-CoV-2 target nucleic acids are NOT DETECTED.  The SARS-CoV-2 RNA is generally detectable in upper respiratoy specimens during the acute phase of infection. The lowest concentration of SARS-CoV-2 viral copies this assay can detect is 131 copies/mL. A negative result does not preclude SARS-Cov-2 infection and should not be used as the sole basis for  treatment or other patient management decisions. A negative result may occur with  improper specimen collection/handling, submission of specimen other than nasopharyngeal swab, presence of viral mutation(s) within the areas targeted by this assay, and inadequate number of viral copies (<131 copies/mL). A negative result must be combined with clinical observations, patient history, and epidemiological information. The expected result is Negative.  Fact Sheet for Patients:  PinkCheek.be  Fact Sheet for Healthcare Providers:  GravelBags.it  This test is no t yet approved or cleared by the [place] FDA and  has been authorized for detection and/or diagnosis of SARS-CoV-2 by FDA under an Emergency Use Authorization (EUA). This EUA will remain  in effect (meaning this test can be used) for the duration of the COVID-19 declaration under Section 564(b)(1) of the Act, 21 U.S.C. section 360bbb-3(b)(1), unless the authorization is terminated or revoked sooner.     Influenza A by PCR NEGATIVE NEGATIVE Final   Influenza B by PCR NEGATIVE NEGATIVE Final    Comment: (NOTE) The Xpert Xpress SARS-CoV-2/FLU/RSV assay is intended as an aid in  the diagnosis of influenza from Nasopharyngeal swab specimens and  should not be used as a sole basis for treatment. Nasal washings and  aspirates are unacceptable for Xpert Xpress SARS-CoV-2/FLU/RSV  testing.  Fact Sheet for Patients: PinkCheek.be  Fact Sheet for Healthcare Providers: GravelBags.it  This test is not yet approved or cleared by the [place] FDA and  has been authorized for detection and/or diagnosis of SARS-CoV-2 by  FDA under an Emergency Use Authorization (EUA). This EUA will remain  in effect (meaning this test can be used) for the duration of the  Covid-19 declaration under Section 564(b)(1) of the Act, 21   U.S.C. section 360bbb-3(b)(1), unless the authorization is  terminated or revoked. Performed at [facility], [address] [place], [place] [address]      Labs: BNP (last 3 results) Recent Labs    02/13/20 0514 02/28/20 1711 06/08/20 1543  BNP 1,636.5* 1,514.4* 7,341.9*   Basic Metabolic Panel: Recent Labs  Lab 06/08/20 1113 06/09/20 0405 06/10/20 0413  NA 138 143 143  K 4.9 4.1 3.8  CL 103 108 107  CO2 25 29 28   GLUCOSE 341* 168* 139*  BUN 59* 59* 52*  CREATININE 2.29*  2.26* 2.08*  CALCIUM 8.8* 8.6* 8.7*   Liver Function Tests: No results for input(s): AST, ALT, ALKPHOS, BILITOT, PROT, ALBUMIN in the last 168 hours. No results for input(s): LIPASE, AMYLASE in the last 168 hours. No results for input(s): AMMONIA in the last 168 hours. CBC: Recent Labs  Lab 06/08/20 1113 06/10/20 0413  WBC 4.1 3.6*  NEUTROABS 3.1  --   HGB 9.8* 9.3*  HCT 28.6* 27.8*  MCV 94.1 95.5  PLT 138* 126*   Cardiac Enzymes: No results for input(s): CKTOTAL, CKMB, CKMBINDEX, TROPONINI in the last 168 hours. BNP: Invalid input(s): POCBNP CBG: Recent Labs  Lab 06/09/20 1213 06/09/20 1542 06/09/20 2249 06/10/20 0851 06/10/20 1209  GLUCAP 100* 231* 257* 148* 154*   D-Dimer No results for input(s): DDIMER in the last 72 hours. Hgb A1c No results for input(s): HGBA1C in the last 72 hours. Lipid Profile No results for input(s): CHOL, HDL, LDLCALC, TRIG, CHOLHDL, LDLDIRECT in the last 72 hours. Thyroid function studies No results for input(s): TSH, T4TOTAL, T3FREE, THYROIDAB in the last 72 hours.  Invalid input(s): FREET3 Anemia work up No results for input(s): VITAMINB12, FOLATE, FERRITIN, TIBC, IRON, RETICCTPCT in the last 72 hours. Urinalysis    Component Value Date/Time   COLORURINE YELLOW (A) 02/11/2020 1608   APPEARANCEUR HAZY (A) 02/11/2020 1608   LABSPEC 1.013 02/11/2020 1608   PHURINE 5.0 02/11/2020 1608   GLUCOSEU NEGATIVE 02/11/2020 1608   HGBUR  MODERATE (A) 02/11/2020 1608   BILIRUBINUR NEGATIVE 02/11/2020 1608   KETONESUR NEGATIVE 02/11/2020 1608   PROTEINUR 30 (A) 02/11/2020 1608   NITRITE NEGATIVE 02/11/2020 1608   LEUKOCYTESUR NEGATIVE 02/11/2020 1608   Sepsis Labs Invalid input(s): PROCALCITONIN,  WBC,  LACTICIDVEN Microbiology Recent Results (from the past 240 hour(s))  Respiratory Panel by RT PCR (Flu A&B, Covid) - Nasopharyngeal Swab     Status: None   Collection Time: 06/08/20  3:43 PM   Specimen: Nasopharyngeal Swab  Result Value Ref Range Status   SARS Coronavirus 2 by RT PCR NEGATIVE NEGATIVE Final    Comment: (NOTE) SARS-CoV-2 target nucleic acids are NOT DETECTED.  The SARS-CoV-2 RNA is generally detectable in upper respiratoy specimens during the acute phase of infection. The lowest concentration of SARS-CoV-2 viral copies this assay can detect is 131 copies/mL. A negative result does not preclude SARS-Cov-2 infection and should not be used as the sole basis for treatment or other patient management decisions. A negative result may occur with  improper specimen collection/handling, submission of specimen other than nasopharyngeal swab, presence of viral mutation(s) within the areas targeted by this assay, and inadequate number of viral copies (<131 copies/mL). A negative result must be combined with clinical observations, patient history, and epidemiological information. The expected result is Negative.  Fact Sheet for Patients:  PinkCheek.be  Fact Sheet for Healthcare Providers:  GravelBags.it  This test is no t yet approved or cleared by the [place] FDA and  has been authorized for detection and/or diagnosis of SARS-CoV-2 by FDA under an Emergency Use Authorization (EUA). This EUA will remain  in effect (meaning this test can be used) for the duration of the COVID-19 declaration under Section 564(b)(1) of the Act, 21 U.S.C. section  360bbb-3(b)(1), unless the authorization is terminated or revoked sooner.     Influenza A by PCR NEGATIVE NEGATIVE Final   Influenza B by PCR NEGATIVE NEGATIVE Final    Comment: (NOTE) The Xpert Xpress SARS-CoV-2/FLU/RSV assay is intended as an aid in  the diagnosis  of influenza from Nasopharyngeal swab specimens and  should not be used as a sole basis for treatment. Nasal washings and  aspirates are unacceptable for Xpert Xpress SARS-CoV-2/FLU/RSV  testing.  Fact Sheet for Patients: PinkCheek.be  Fact Sheet for Healthcare Providers: GravelBags.it  This test is not yet approved or cleared by the [place] FDA and  has been authorized for detection and/or diagnosis of SARS-CoV-2 by  FDA under an Emergency Use Authorization (EUA). This EUA will remain  in effect (meaning this test can be used) for the duration of the  Covid-19 declaration under Section 564(b)(1) of the Act, 21  U.S.C. section 360bbb-3(b)(1), unless the authorization is  terminated or revoked. Performed at [facility], [address] [place], [place] [address]     Time coordinating discharge: Over 30 minutes  SIGNED:  Lorella Nimrod, MD  [facility] 06/10/2020, 3:25 PM  If 7PM-7AM, please contact night-coverage [email or web address]  This record has been created using Systems analyst. Errors have been sought and corrected,but may not always be located. Such creation errors do not reflect on the standard of care.

## 2020-06-10 NOTE — Procedures (Signed)
Ultrasound-guided diagnostic and therapeutic left thoracentesis performed yielding 1.7 liters of amber colored fluid. No immediate complications. Follow-up chest x-ray pending. The fluid was sent to the lab for preordered studies. EBL< 1 cc.

## 2020-06-10 NOTE — Progress Notes (Signed)
[facility] Encounter Note  Patient: Rick Zuniga / Admit Date: 06/08/2020 / Date of Encounter: 06/10/2020, 9:00 AM   Subjective: Patient claims that he is significantly improved from yesterday with less shortness of breath and lower extremity edema chest x-ray was repeated showing moderate left pleural effusion multifactorial in nature including diastolic dysfunction congestive heart failure Echocardiogram showing normal LV systolic function with ejection fraction of 55 percent and no evidence of significant valvular heart disease Patient has had elevated BNP of 1490 and anemia and chronic kidney disease are contributing to above  Review of Systems: Positive for: Shortness of breath edema Negative for: Vision change, hearing change, syncope, dizziness, nausea, vomiting,diarrhea, bloody stool, stomach pain, cough, congestion, diaphoresis, urinary frequency, urinary pain,skin lesions, skin rashes Others previously listed  Objective:  Physical Exam: Blood pressure 117/67, pulse 73, temperature 98.2 F (36.8 C), temperature source Oral, resp. rate 18, height 6\' 1"  (1.854 m), weight 77.6 kg, SpO2 98 %. Body mass index is 22.57 kg/m. General: Well developed, well nourished, in no acute distress. Head: Normocephalic, atraumatic, sclera non-icteric, no xanthomas, nares are without discharge. Neck: No apparent masses Lungs: Normal respirations with no wheezes, no rhonchi, no rales , few crackles left basilar decreased breath sounds  Heart: Regular rate and rhythm, normal S1 S2, no murmur, no rub, no gallop, PMI is normal size and placement, carotid upstroke normal without bruit, jugular venous pressure normal Abdomen: Soft, non-tender, non-distended with normoactive bowel sounds. No hepatosplenomegaly. Abdominal aorta is normal size without bruit Extremities: 1+ edema, no clubbing, no cyanosis, no ulcers,  Peripheral: 2+ radial, 2+ femoral, 2+ dorsal pedal  pulses Neuro: Alert and oriented. Moves all extremities spontaneously. Psych:  Responds to questions appropriately with a normal affect.   Intake/Output Summary (Last 24 hours) at 06/10/2020 0900 Last data filed at 06/10/2020 0981 Gross per 24 hour  Intake --  Output 2750 ml  Net -2750 ml    Inpatient Medications:  . aspirin EC  81 mg Oral Daily  . carvedilol  6.25 mg Oral BID WC  . enoxaparin (LOVENOX) injection  1 mg/kg Subcutaneous Q12H  . ferrous sulfate  325 mg Oral BID WC  . furosemide  40 mg Intravenous BID  . insulin aspart  0-5 Units Subcutaneous QHS  . insulin aspart  0-9 Units Subcutaneous TID WC  . insulin detemir  10 Units Subcutaneous QHS  . isosorbide mononitrate  30 mg Oral Daily  . sodium chloride flush  3 mL Intravenous Q12H   Infusions:  . sodium chloride      Labs: Recent Labs    06/09/20 0405 06/10/20 0413  NA 143 143  K 4.1 3.8  CL 108 107  CO2 29 28  GLUCOSE 168* 139*  BUN 59* 52*  CREATININE 2.26* 2.08*  CALCIUM 8.6* 8.7*   No results for input(s): AST, ALT, ALKPHOS, BILITOT, PROT, ALBUMIN in the last 72 hours. Recent Labs    06/08/20 1113 06/10/20 0413  WBC 4.1 3.6*  NEUTROABS 3.1  --   HGB 9.8* 9.3*  HCT 28.6* 27.8*  MCV 94.1 95.5  PLT 138* 126*   No results for input(s): CKTOTAL, CKMB, TROPONINI in the last 72 hours. Invalid input(s): POCBNP No results for input(s): HGBA1C in the last 72 hours.   Weights: Filed Weights   06/08/20 1108 06/09/20 0500 06/10/20 0503  Weight: 73.3 kg 81.1 kg 77.6 kg     Radiology/Studies:  DG Chest 2 View  Result Date: 06/09/2020 CLINICAL DATA:  Left pleural effusion. 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 Stable moderate left pleural effusion with associated left basilar atelectasis. Probable tiny amount of residual right pleural fluid. Pulmonary venous hypertension present without overt airspace edema. No pneumothorax. IMPRESSION: Stable  moderate left pleural effusion and associated left basilar atelectasis. Probable tiny amount of residual right pleural fluid. Electronically Signed   By: Aletta Edouard M.D.   On: 06/09/2020 12:07   DG Chest 2 View  Result Date: 06/08/2020 CLINICAL DATA:  Short of breath.  Fluid in lungs. EXAM: CHEST - 2 VIEW COMPARISON:  02/28/2020 FINDINGS: Postop CABG. Negative for vascular congestion. Moderate left effusion unchanged with left lower lobe atelectasis. Interval improvement in right effusion and right lower lobe atelectasis since the prior study. No pulmonary edema. IMPRESSION: Moderate left effusion and left lower lobe atelectasis unchanged. Improved aeration right lung base. Negative for edema. Electronically Signed   By: Franchot Gallo M.D.   On: 06/08/2020 11:42     Assessment and Recommendation  74 y.o. male with acute on chronic diastolic dysfunction congestive heart failure known coronary disease status post coronary bypass graft diabetes hypertension hyperlipidemia chronic kidney disease and anemia without evidence of acute coronary syndrome  Acute on chronic diastolic dysfunction congestive heart failure Patient has had some improvements of urine output and shortness of breath and lower extremity edema but continued moderate pleural effusion on the left.  Would consider thoracentesis of left pleural effusion and due to need for overall improvements of symptoms in the setting of anemia and chronic kidney disease stage IV  Coronary artery disease Patient has not had any acute coronary syndrome anginal symptoms and troponin level is flat at 148, 168 consistent with renal disease rather than myocardial infarction  Hypertension Patient has had continued appropriate use of carvedilol for hypertension and cardiomyopathy and would abstain from angiotensin receptor blocker and ACE inhibitor at this time due to's concerns of kidney injury but would readdress as outpatient  Signed, Serafina Royals M.D. FACC

## 2020-06-11 LAB — PROTEIN, BODY FLUID (OTHER): Total Protein, Body Fluid Other: 1.7 g/dL

## 2020-06-11 LAB — PATHOLOGIST SMEAR REVIEW

## 2020-06-11 LAB — HEMOGLOBIN A1C
Hgb A1c MFr Bld: 10.9 % — ABNORMAL HIGH (ref 4.8–5.6)
Mean Plasma Glucose: 266 mg/dL

## 2020-06-14 LAB — BODY FLUID CULTURE: Culture: NO GROWTH

## 2020-06-20 ENCOUNTER — Ambulatory Visit: Payer: Medicare Other | Admitting: Family

## 2020-07-25 ENCOUNTER — Other Ambulatory Visit: Payer: Self-pay | Admitting: Pulmonary Disease

## 2020-07-25 ENCOUNTER — Other Ambulatory Visit
Admission: RE | Admit: 2020-07-25 | Discharge: 2020-07-25 | Disposition: A | Discharge status: Home / Self Care | Payer: Medicare Other | Source: Ambulatory Visit | Attending: Pulmonary Disease | Admitting: Pulmonary Disease

## 2020-07-25 ENCOUNTER — Other Ambulatory Visit: Payer: Self-pay

## 2020-07-25 DIAGNOSIS — Z01812 Encounter for preprocedural laboratory examination: Secondary | ICD-10-CM | POA: Diagnosis present

## 2020-07-25 DIAGNOSIS — Z20822 Contact with and (suspected) exposure to covid-19: Secondary | ICD-10-CM | POA: Insufficient documentation

## 2020-07-25 DIAGNOSIS — J90 Pleural effusion, not elsewhere classified: Secondary | ICD-10-CM

## 2020-07-25 LAB — SARS CORONAVIRUS 2 (TAT 6-24 HRS): SARS Coronavirus 2: NEGATIVE

## 2020-07-27 ENCOUNTER — Other Ambulatory Visit: Payer: Self-pay

## 2020-07-27 ENCOUNTER — Ambulatory Visit
Admission: RE | Admit: 2020-07-27 | Discharge: 2020-07-27 | Disposition: A | Discharge status: Home / Self Care | Payer: Medicare Other | Source: Ambulatory Visit | Attending: Student | Admitting: Student

## 2020-07-27 ENCOUNTER — Other Ambulatory Visit: Payer: Self-pay | Admitting: Pulmonary Disease

## 2020-07-27 ENCOUNTER — Ambulatory Visit
Admission: RE | Admit: 2020-07-27 | Discharge: 2020-07-27 | Disposition: A | Discharge status: Home / Self Care | Payer: Medicare Other | Source: Ambulatory Visit | Attending: Pulmonary Disease | Admitting: Pulmonary Disease

## 2020-07-27 ENCOUNTER — Other Ambulatory Visit: Payer: Self-pay | Admitting: Student

## 2020-07-27 DIAGNOSIS — J90 Pleural effusion, not elsewhere classified: Secondary | ICD-10-CM | POA: Diagnosis not present

## 2020-07-27 DIAGNOSIS — Z9889 Other specified postprocedural states: Secondary | ICD-10-CM

## 2020-07-27 LAB — BODY FLUID CELL COUNT WITH DIFFERENTIAL
Eos, Fluid: 0 %
Lymphs, Fluid: 32 %
Monocyte-Macrophage-Serous Fluid: 43 %
Neutrophil Count, Fluid: 22 %
Total Nucleated Cell Count, Fluid: 326 cu mm

## 2020-07-27 LAB — AMYLASE, PLEURAL OR PERITONEAL FLUID: Amylase, Fluid: 15 U/L

## 2020-07-27 LAB — LACTATE DEHYDROGENASE, PLEURAL OR PERITONEAL FLUID: LD, Fluid: 43 U/L — ABNORMAL HIGH (ref 3–23)

## 2020-07-27 LAB — PROTEIN, PLEURAL OR PERITONEAL FLUID: Total protein, fluid: <3 g/dL

## 2020-07-27 LAB — GLUCOSE, PLEURAL OR PERITONEAL FLUID: Glucose, Fluid: 191 mg/dL

## 2020-07-27 NOTE — Procedures (Signed)
PROCEDURE SUMMARY:  Successful US guided left thoracentesis. Yielded 1850 ml of clear yellow fluid. Pt tolerated procedure well. No immediate complications.  Specimen sent for labs. CXR ordered; no post-procedure pneumothorax identified  EBL < 2 mL  Theresa Duty, NP 07/27/2020 3:22 PM

## 2020-07-28 LAB — ACID FAST SMEAR (AFB, MYCOBACTERIA): Acid Fast Smear: NEGATIVE

## 2020-07-29 LAB — PROTEIN, BODY FLUID (OTHER): Total Protein, Body Fluid Other: 1.5 g/dL

## 2020-07-30 LAB — CYTOLOGY - NON PAP

## 2020-07-31 LAB — BODY FLUID CULTURE: Culture: NO GROWTH

## 2020-08-01 LAB — CHOLESTEROL, BODY FLUID: Cholesterol, Fluid: 13 mg/dL

## 2020-08-08 ENCOUNTER — Other Ambulatory Visit: Payer: Self-pay

## 2020-08-08 ENCOUNTER — Other Ambulatory Visit: Payer: Self-pay | Admitting: Pulmonary Disease

## 2020-08-08 ENCOUNTER — Other Ambulatory Visit
Admission: RE | Admit: 2020-08-08 | Discharge: 2020-08-08 | Disposition: A | Discharge status: Home / Self Care | Payer: Medicare Other | Source: Ambulatory Visit | Attending: Pulmonary Disease | Admitting: Pulmonary Disease

## 2020-08-08 DIAGNOSIS — Z01812 Encounter for preprocedural laboratory examination: Secondary | ICD-10-CM | POA: Diagnosis present

## 2020-08-08 DIAGNOSIS — J90 Pleural effusion, not elsewhere classified: Secondary | ICD-10-CM

## 2020-08-08 DIAGNOSIS — Z20822 Contact with and (suspected) exposure to covid-19: Secondary | ICD-10-CM | POA: Insufficient documentation

## 2020-08-08 LAB — SARS CORONAVIRUS 2 BY RT PCR (HOSPITAL ORDER, PERFORMED IN ~~LOC~~ HOSPITAL LAB): SARS Coronavirus 2: NEGATIVE

## 2020-08-09 ENCOUNTER — Ambulatory Visit
Admission: RE | Admit: 2020-08-09 | Discharge: 2020-08-09 | Disposition: A | Discharge status: Home / Self Care | Payer: Medicare Other | Source: Ambulatory Visit | Attending: Interventional Radiology | Admitting: Interventional Radiology

## 2020-08-09 ENCOUNTER — Other Ambulatory Visit: Payer: Self-pay | Admitting: Interventional Radiology

## 2020-08-09 ENCOUNTER — Ambulatory Visit
Admission: RE | Admit: 2020-08-09 | Discharge: 2020-08-09 | Disposition: A | Discharge status: Home / Self Care | Payer: Medicare Other | Source: Ambulatory Visit | Attending: Pulmonary Disease | Admitting: Pulmonary Disease

## 2020-08-09 DIAGNOSIS — Z9889 Other specified postprocedural states: Secondary | ICD-10-CM

## 2020-08-09 DIAGNOSIS — J90 Pleural effusion, not elsewhere classified: Secondary | ICD-10-CM

## 2020-08-09 LAB — LACTATE DEHYDROGENASE, PLEURAL OR PERITONEAL FLUID: LD, Fluid: 59 U/L — ABNORMAL HIGH (ref 3–23)

## 2020-08-09 LAB — AMYLASE, PLEURAL OR PERITONEAL FLUID: Amylase, Fluid: 21 U/L

## 2020-08-09 LAB — GLUCOSE, PLEURAL OR PERITONEAL FLUID: Glucose, Fluid: 266 mg/dL

## 2020-08-09 LAB — BODY FLUID CELL COUNT WITH DIFFERENTIAL
Eos, Fluid: 7 %
Lymphs, Fluid: 29 %
Monocyte-Macrophage-Serous Fluid: 27 %
Neutrophil Count, Fluid: 37 %
Total Nucleated Cell Count, Fluid: 132 cu mm

## 2020-08-09 LAB — PROTEIN, PLEURAL OR PERITONEAL FLUID: Total protein, fluid: <3 g/dL

## 2020-08-09 NOTE — Procedures (Signed)
  Procedure: US thoracentesis LEFT EBL:   minimal Complications:  none immediate  See full dictation in [facility].  Dillard Cannon MD Main # [phone] Pager  [phone] Mobile [phone]

## 2020-08-10 LAB — PROTEIN, BODY FLUID (OTHER): Total Protein, Body Fluid Other: 1.8 g/dL

## 2020-08-10 LAB — ACID FAST SMEAR (AFB, MYCOBACTERIA): Acid Fast Smear: NEGATIVE

## 2020-08-13 LAB — BODY FLUID CULTURE: Culture: NO GROWTH

## 2020-08-13 LAB — PH, BODY FLUID: pH, Body Fluid: 7.8

## 2020-08-14 LAB — CYTOLOGY - NON PAP

## 2020-08-28 LAB — FUNGUS CULTURE RESULT

## 2020-08-28 LAB — FUNGUS CULTURE WITH STAIN

## 2020-08-28 LAB — FUNGAL ORGANISM REFLEX

## 2020-09-09 LAB — ACID FAST CULTURE WITH REFLEXED SENSITIVITIES (MYCOBACTERIA): Acid Fast Culture: NEGATIVE

## 2020-09-11 LAB — FUNGUS CULTURE RESULT

## 2020-09-11 LAB — FUNGAL ORGANISM REFLEX

## 2020-09-11 LAB — FUNGUS CULTURE WITH STAIN

## 2020-09-22 LAB — ACID FAST CULTURE WITH REFLEXED SENSITIVITIES (MYCOBACTERIA): Acid Fast Culture: NEGATIVE

## 2020-10-15 ENCOUNTER — Other Ambulatory Visit: Payer: Self-pay | Admitting: Pulmonary Disease

## 2020-10-15 ENCOUNTER — Ambulatory Visit
Admission: RE | Admit: 2020-10-15 | Discharge: 2020-10-15 | Disposition: A | Discharge status: Home / Self Care | Payer: Medicare Other | Source: Ambulatory Visit | Attending: Pulmonary Disease | Admitting: Pulmonary Disease

## 2020-10-15 ENCOUNTER — Other Ambulatory Visit
Admission: RE | Admit: 2020-10-15 | Discharge: 2020-10-15 | Disposition: A | Discharge status: Home / Self Care | Payer: Medicare Other | Source: Ambulatory Visit | Attending: Specialist | Admitting: Specialist

## 2020-10-15 ENCOUNTER — Ambulatory Visit
Admission: RE | Admit: 2020-10-15 | Discharge: 2020-10-15 | Disposition: A | Discharge status: Home / Self Care | Payer: Medicare Other | Source: Ambulatory Visit | Attending: Student | Admitting: Student

## 2020-10-15 ENCOUNTER — Other Ambulatory Visit: Payer: Self-pay

## 2020-10-15 DIAGNOSIS — J90 Pleural effusion, not elsewhere classified: Secondary | ICD-10-CM

## 2020-10-15 DIAGNOSIS — Z01812 Encounter for preprocedural laboratory examination: Secondary | ICD-10-CM | POA: Insufficient documentation

## 2020-10-15 DIAGNOSIS — Z9889 Other specified postprocedural states: Secondary | ICD-10-CM

## 2020-10-15 DIAGNOSIS — Z20822 Contact with and (suspected) exposure to covid-19: Secondary | ICD-10-CM | POA: Diagnosis not present

## 2020-10-15 LAB — BODY FLUID CELL COUNT WITH DIFFERENTIAL
Eos, Fluid: 3 %
Lymphs, Fluid: 42 %
Monocyte-Macrophage-Serous Fluid: 40 %
Neutrophil Count, Fluid: 15 %
Total Nucleated Cell Count, Fluid: 194 cu mm

## 2020-10-15 LAB — LACTATE DEHYDROGENASE, PLEURAL OR PERITONEAL FLUID: LD, Fluid: 58 U/L — ABNORMAL HIGH (ref 3–23)

## 2020-10-15 LAB — PROTEIN, PLEURAL OR PERITONEAL FLUID: Total protein, fluid: <3 g/dL

## 2020-10-15 LAB — GLUCOSE, PLEURAL OR PERITONEAL FLUID: Glucose, Fluid: 233 mg/dL

## 2020-10-15 LAB — AMYLASE, PLEURAL OR PERITONEAL FLUID: Amylase, Fluid: 22 U/L

## 2020-10-15 LAB — SARS CORONAVIRUS 2 BY RT PCR (HOSPITAL ORDER, PERFORMED IN ~~LOC~~ HOSPITAL LAB): SARS Coronavirus 2: NEGATIVE

## 2020-10-15 NOTE — Procedures (Signed)
PROCEDURE SUMMARY:  Successful US guided left thoracentesis. Yielded 1.2 L of dark yellow fluid. Pt tolerated procedure well. No immediate complications.  Specimen sent for labs. CXR ordered; no post-procedure pneumothorax identified  EBL < 1 mL  Theresa Duty, NP 10/15/2020 4:17 PM

## 2020-10-16 LAB — ACID FAST SMEAR (AFB, MYCOBACTERIA): Acid Fast Smear: NEGATIVE

## 2020-10-16 LAB — PROTEIN, BODY FLUID (OTHER): Total Protein, Body Fluid Other: 2.6 g/dL

## 2020-10-17 LAB — PH, BODY FLUID: pH, Body Fluid: 7.4

## 2020-10-17 LAB — CYTOLOGY - NON PAP

## 2020-10-19 LAB — BODY FLUID CULTURE W GRAM STAIN
Culture: NO GROWTH
Gram Stain: NONE SEEN

## 2020-10-30 ENCOUNTER — Other Ambulatory Visit: Payer: Self-pay | Admitting: Thoracic Surgery (Cardiothoracic Vascular Surgery)

## 2020-10-30 ENCOUNTER — Other Ambulatory Visit: Payer: Self-pay

## 2020-10-30 DIAGNOSIS — J90 Pleural effusion, not elsewhere classified: Secondary | ICD-10-CM

## 2020-10-31 ENCOUNTER — Other Ambulatory Visit: Payer: Self-pay

## 2020-10-31 ENCOUNTER — Encounter: Payer: Self-pay | Admitting: Thoracic Surgery (Cardiothoracic Vascular Surgery)

## 2020-10-31 ENCOUNTER — Institutional Professional Consult (permissible substitution) (INDEPENDENT_AMBULATORY_CARE_PROVIDER_SITE_OTHER): Payer: Medicare Other | Admitting: Thoracic Surgery (Cardiothoracic Vascular Surgery)

## 2020-10-31 ENCOUNTER — Ambulatory Visit
Admission: RE | Admit: 2020-10-31 | Discharge: 2020-10-31 | Disposition: A | Discharge status: Home / Self Care | Payer: Medicare Other | Source: Ambulatory Visit | Attending: Thoracic Surgery (Cardiothoracic Vascular Surgery) | Admitting: Thoracic Surgery (Cardiothoracic Vascular Surgery)

## 2020-10-31 VITALS — BP 121/67 | HR 67 | Resp 20 | Ht 73.0 in | Wt 170.0 lb

## 2020-10-31 DIAGNOSIS — J90 Pleural effusion, not elsewhere classified: Secondary | ICD-10-CM

## 2020-10-31 NOTE — Progress Notes (Signed)
PCP is [name], MD Referring Provider is Ottie Glazier, MD  Chief Complaint  Patient presents with  . Pleural Effusion    Surgical consult, CXR- today,  Hx of thoracentesis on 10/15/20    HPI: Rick Zuniga is sent for consultation regarding a recurrent left pleural effusion.  Rick Zuniga is a 75 year old man with a past medical history significant for CAD, CABG, CHF, COPD, DVT, stage IIIb chronic kidney disease, type 2 diabetes, and thrombocytopenia.  He says he has been having problems with shortness of breath for about the past year.  He was found to have a left pleural effusion.  He has had 4 thoracenteses.  He has had 3 negative cytologies.  Pleural fluid chemistries have shown mildly elevated LDH, low total protein and elevated glucose.  After each thoracentesis he will have some improvement symptomatically that lasts a few days before symptoms start to return.  He does have shortness of breath with exertion and orthopnea.  Peripheral edema at various times but had some adjustments made to his diuretics which helped that.  He has had complaints of fatigue and significant weight loss over the past year.  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  Eliquis recently stopped due to recurrent severe nosebleeds.  Zubrod Score: At the time of surgery this patient's most appropriate activity status/level should be described as: '[]'$     0    Normal activity, no symptoms '[x]'$     1    Restricted in physical strenuous activity but ambulatory, able to do out light work '[]'$     2    Ambulatory and capable of self care, unable to do work activities, up and about >50 % of waking hours                              '[]'$     3    Only limited self care, in bed greater than 50% of waking hours '[]'$     4    Completely disabled, no self care, confined to bed or chair '[]'$     5    Moribund  Past Medical History:  Diagnosis Date  . Arthritis   . CHF (congestive heart failure)  ([place])   . Chronic kidney disease   . COPD (chronic obstructive pulmonary disease) ([place])   . Diabetes ([place])   . Heart disease   . Hypertension   . Indigestion   . Thrombocytopenia ([place])   . Wears dentures    full upper and lower    Past Surgical History:  Procedure Laterality Date  . APPENDECTOMY    . CARDIAC SURGERY     bypass  . CHOLECYSTECTOMY    . COLONOSCOPY  12/23/2010    Family History  Problem Relation Age of Onset  . CAD Mother   . Kidney failure Father   . Diabetes Father     Social History Social History   Tobacco Use  . Smoking status: Never Smoker  . Smokeless tobacco: Never Used  Vaping Use  . Vaping Use: Never used  Substance Use Topics  . Alcohol use: No  . Drug use: No    Current Outpatient Medications  Medication Sig Dispense Refill  . carvedilol (COREG) 6.25 MG tablet Take 6.25 mg by mouth 2 (two) times daily with a meal.    . glipiZIDE (GLUCOTROL) 10 MG tablet Take 10 mg by mouth 2 (two) times  daily.     Marland Kitchen HYDROcodone-acetaminophen (NORCO) 7.5-325 MG tablet Take 1 tablet by mouth every 6 (six) hours as needed for moderate pain.    Marland Kitchen insulin NPH-regular Human (NOVOLIN 70/30) (70-30) 100 UNIT/ML injection Inject 15 Units into the skin at bedtime.     . isosorbide mononitrate (IMDUR) 30 MG 24 hr tablet Take 30 mg by mouth daily.     . meclizine (ANTIVERT) 25 MG tablet Take 1 tablet (25 mg total) by mouth 3 (three) times daily as needed for dizziness. 30 tablet 0  . nitroGLYCERIN (NITROSTAT) 0.4 MG SL tablet Place 0.4 mg under the tongue every 5 (five) minutes as needed for chest pain.    Marland Kitchen torsemide (DEMADEX) 20 MG tablet Take 20 mg by mouth 2 (two) times daily.     Marland Kitchen apixaban (ELIQUIS) 5 MG TABS tablet Take 1 tablet (5 mg total) by mouth 2 (two) times daily. (Patient not taking: Reported on 10/31/2020) 60 tablet 1  . aspirin EC 81 MG tablet Take 81 mg by mouth daily.  (Patient not taking: Reported on 10/31/2020)    . ferrous sulfate 325 (65 FE) MG  tablet Take 1 tablet (325 mg total) by mouth 2 (two) times daily with a meal. (Patient not taking: Reported on 10/31/2020) 60 tablet 1   No current facility-administered medications for this visit.    Allergies  Allergen Reactions  . Pioglitazone Swelling  . Furosemide Other (See Comments)    Pt may have had swelling in his face per spouse.  Drug discontinued    . Metformin And Related Diarrhea  . Atorvastatin Rash  . Benazepril Rash  . Latex Rash    (tape only)  . Other Rash    Pt reported that he is allergic to Ace wrap.  Reaction include severe skin irritation. Pt reported that he is allergic to Ace wrap.  Reaction include severe skin irritation.  . Tape Rash    Review of Systems  Constitutional: Positive for activity change, fatigue and unexpected weight change (Significant over past year, No change last 3 months).  HENT: Positive for dental problem and nosebleeds.   Eyes: Positive for visual disturbance.  Respiratory: Positive for cough and shortness of breath. Negative for chest tightness.   Cardiovascular: Positive for palpitations and leg swelling (improved). Negative for chest pain.       Orthopnea  Gastrointestinal: Positive for abdominal pain (reflux) and diarrhea.  Genitourinary: Positive for frequency. Negative for difficulty urinating.  Musculoskeletal: Positive for arthralgias and joint swelling.  Neurological: Negative for seizures.       Frequent falls  Hematological: Bruises/bleeds easily (Eliquis).  All other systems reviewed and are negative.   BP 121/67   Pulse 67   Resp 20   Ht '6\' 1"'$  (1.854 m)   Wt 170 lb (77.1 kg)   SpO2 98%   BMI 22.43 kg/m  Physical Exam Vitals reviewed.  Constitutional:      General: He is not in acute distress. HENT:     Head: Normocephalic and atraumatic.  Eyes:     General: No scleral icterus.    Extraocular Movements: Extraocular movements intact.  Cardiovascular:     Rate and Rhythm: Normal rate and regular rhythm.      Heart sounds: Murmur (2/6 systolic) heard.    Pulmonary:     Effort: Pulmonary effort is normal. No respiratory distress.     Breath sounds: No wheezing.     Comments: Absent breath sounds with dullness to percussion  left base Abdominal:     General: Abdomen is flat. There is no distension.  Musculoskeletal:     Cervical back: Neck supple.     Right lower leg: No edema.     Left lower leg: No edema.  Lymphadenopathy:     Cervical: No cervical adenopathy.  Skin:    General: Skin is warm and dry.  Neurological:     General: No focal deficit present.     Mental Status: He is alert and oriented to person, place, and time.     Cranial Nerves: No cranial nerve deficit.    Diagnostic Tests: CHEST - 2 VIEW  COMPARISON:  Most recent radiograph 10/15/2020.  FINDINGS: Re-accumulation of left pleural effusion with opacification of the lower 1/3 of left hemithorax, moderate to large in size. Present associated compressive atelectasis. No mediastinal shift. Post median sternotomy with stable heart size, left heart borders obscured by pleural fluid. Suspect trace right pleural effusion and basilar atelectasis. No pneumothorax, including previous left basilar pneumothorax. Mild peribronchial thickening. No acute osseous abnormalities are seen.  IMPRESSION: 1. Re-accumulation of left pleural effusion, moderate to large in size. Likely associated compressive atelectasis. 2. Suspect trace right pleural effusion and basilar atelectasis.   Electronically Signed   By: Keith Rake M.D.   On: 10/31/2020 15:28 I personally reviewed the chest x-ray.  There is a moderate to large left pleural effusion.  Impression: Blanch Dykhuizen is a 75 year old man with a past medical history significant for CAD, CABG, CHF, COPD, DVT, stage IIIb chronic kidney disease, type 2 diabetes, and thrombocytopenia.  He has been having dyspnea for about a year.  He has had recurrent left pleural  effusions over the past 6 months.  He has had 4 thoracenteses.  Each time he has gotten symptomatic relief but that has been relatively short-lived and his symptoms recur usually within a few days.  His lung does reexpand afterwards, so does not appear to be trapped.  We discussed a couple of different options for managing the pleural effusion.  One would be pleural catheter placement and the other would be VATS for talc pleurodesis.  Both have advantages and disadvantages.  Pleural catheter can be done as an outpatient with minimal discomfort under local anesthesia.  Given his comorbidities there is certainly some risk to a more invasive procedure.  However, it does require ongoing care of the catheter.  The catheter usually would be in for about 2 to 3 months before it can be removed.  The other option would be a top pleurodesis.  That carries more discomfort and risk upfront but does not require the ongoing care that the catheter would.  I had a long discussion with Mr. Ewell and his fiance regarding the options.  We discussed the indications, risks, benefits, and alternatives.  They understand the risks with pleural catheter include bleeding and infection.  They understand a VATS would be a bigger procedure requiring general anesthesia and a brief hospital stay.  Risks include, but are not limited to death, MI, DVT, PE, bleeding, infection, recurrent effusion, as well as possibility of other unstable complications.    She very much favored talc pleurodesis but he strongly favored pleural catheter placement.   After much discussion he wants to try a pleural catheter.  History DVT-Eliquis currently on hold due to nosebleeds  Congestive heart failure-EF about 40% by echo, appears relatively compensated currently with no peripheral edema.  Plan: Left pleural catheter placement on Monday, 11/05/2020. We will plan  to do this as an outpatient.  We will try to ensure that the home health nurse can  be arranged to assist with catheter management discharge.  Melrose Nakayama, MD [facility] [phone]

## 2020-11-01 ENCOUNTER — Other Ambulatory Visit: Payer: Self-pay | Admitting: *Deleted

## 2020-11-01 DIAGNOSIS — J90 Pleural effusion, not elsewhere classified: Secondary | ICD-10-CM

## 2020-11-02 ENCOUNTER — Other Ambulatory Visit: Payer: Medicare Other

## 2020-11-05 ENCOUNTER — Encounter (HOSPITAL_COMMUNITY): Admission: RE | Payer: Self-pay | Source: Home / Self Care

## 2020-11-05 ENCOUNTER — Ambulatory Visit (HOSPITAL_COMMUNITY)
Admission: RE | Admit: 2020-11-05 | Payer: Medicare Other | Source: Home / Self Care | Admitting: Thoracic Surgery (Cardiothoracic Vascular Surgery)

## 2020-11-05 SURGERY — INSERTION, PLEURAL DRAINAGE CATHETER
Anesthesia: Monitor Anesthesia Care | Laterality: Left

## 2020-11-13 ENCOUNTER — Other Ambulatory Visit: Payer: Self-pay | Admitting: Pulmonary Disease

## 2020-11-13 ENCOUNTER — Other Ambulatory Visit: Payer: Self-pay

## 2020-11-13 ENCOUNTER — Other Ambulatory Visit
Admission: RE | Admit: 2020-11-13 | Discharge: 2020-11-13 | Disposition: A | Discharge status: Home / Self Care | Payer: Medicare Other | Source: Ambulatory Visit | Attending: Pulmonary Disease | Admitting: Pulmonary Disease

## 2020-11-13 DIAGNOSIS — Z01812 Encounter for preprocedural laboratory examination: Secondary | ICD-10-CM | POA: Diagnosis present

## 2020-11-13 DIAGNOSIS — J90 Pleural effusion, not elsewhere classified: Secondary | ICD-10-CM

## 2020-11-13 DIAGNOSIS — Z20822 Contact with and (suspected) exposure to covid-19: Secondary | ICD-10-CM | POA: Diagnosis not present

## 2020-11-13 LAB — SARS CORONAVIRUS 2 BY RT PCR (HOSPITAL ORDER, PERFORMED IN ~~LOC~~ HOSPITAL LAB): SARS Coronavirus 2: NEGATIVE

## 2020-11-14 ENCOUNTER — Other Ambulatory Visit: Payer: Self-pay | Admitting: Interventional Radiology

## 2020-11-14 ENCOUNTER — Ambulatory Visit
Admission: RE | Admit: 2020-11-14 | Discharge: 2020-11-14 | Disposition: A | Discharge status: Home / Self Care | Payer: Medicare Other | Source: Ambulatory Visit | Attending: Pulmonary Disease | Admitting: Pulmonary Disease

## 2020-11-14 ENCOUNTER — Ambulatory Visit
Admission: RE | Admit: 2020-11-14 | Discharge: 2020-11-14 | Disposition: A | Discharge status: Home / Self Care | Payer: Medicare Other | Source: Ambulatory Visit | Attending: Interventional Radiology | Admitting: Interventional Radiology

## 2020-11-14 DIAGNOSIS — Z9889 Other specified postprocedural states: Secondary | ICD-10-CM | POA: Diagnosis not present

## 2020-11-14 DIAGNOSIS — J90 Pleural effusion, not elsewhere classified: Secondary | ICD-10-CM | POA: Insufficient documentation

## 2020-11-14 LAB — LACTATE DEHYDROGENASE, PLEURAL OR PERITONEAL FLUID: LD, Fluid: 67 U/L — ABNORMAL HIGH (ref 3–23)

## 2020-11-14 LAB — BODY FLUID CELL COUNT WITH DIFFERENTIAL
Eos, Fluid: 30 %
Lymphs, Fluid: 55 %
Monocyte-Macrophage-Serous Fluid: 11 %
Neutrophil Count, Fluid: 3 %
Total Nucleated Cell Count, Fluid: 207 cu mm

## 2020-11-14 LAB — GLUCOSE, PLEURAL OR PERITONEAL FLUID: Glucose, Fluid: 154 mg/dL

## 2020-11-14 LAB — AMYLASE, PLEURAL OR PERITONEAL FLUID: Amylase, Fluid: 19 U/L

## 2020-11-14 LAB — PROTEIN, PLEURAL OR PERITONEAL FLUID: Total protein, fluid: <3 g/dL

## 2020-11-14 NOTE — Procedures (Signed)
Interventional Radiology Procedure Note  Procedure: Left thoracentesis  Indication: Recurrent left pleural effusion  Findings: Please refer to procedural dictation for full description.  Complications: None  EBL: < 10 mL  Rick Roux, MD [phone]

## 2020-11-15 LAB — FUNGUS CULTURE WITH STAIN

## 2020-11-15 LAB — FUNGUS CULTURE RESULT

## 2020-11-15 LAB — CYTOLOGY - NON PAP

## 2020-11-15 LAB — PROTEIN, BODY FLUID (OTHER): Total Protein, Body Fluid Other: 2.2 g/dL

## 2020-11-15 LAB — FUNGAL ORGANISM REFLEX

## 2020-11-15 LAB — ACID FAST SMEAR (AFB, MYCOBACTERIA): Acid Fast Smear: NEGATIVE

## 2020-11-16 ENCOUNTER — Ambulatory Visit: Payer: Medicare Other

## 2020-11-17 LAB — CHOLESTEROL, BODY FLUID: Cholesterol, Fluid: 20 mg/dL

## 2020-11-17 LAB — BODY FLUID CULTURE W GRAM STAIN: Culture: NO GROWTH

## 2020-11-26 NOTE — Progress Notes (Signed)
[facility] Note  11/28/2020     CHIEF COMPLAINT Patient presents for Retina Follow Up   HISTORY OF PRESENT ILLNESS: Rick Zuniga is a 75 y.o. male who presents to the clinic today for:   HPI    Retina Follow Up    Patient presents with  Diabetic Retinopathy.  In both eyes.  This started 10 months ago.  Severity is moderate.  Duration of 10 months.  Since onset it is gradually improving.  I, the attending physician,  performed the HPI with the patient and updated documentation appropriately.          Comments    75 y/o male pt here for 10 mo f/u for severe NPDR w/DME OU.  Was supposed to return 02/2020, but f/u was delayed due to the deaths of several family members, and pt being in and out of the hospital w/lung issues.  Feels like his vision OU has vastly deteriorated since last visit.  Denies pain, FOL, floaters.  BS 229 today.  A1C unknown.  No gtts.       Last edited by [name], MD on 11/28/2020 10:12 PM. (History)    Patient is delayed to follow up since June 2021 due to "family tragedies" and other health problems, pt states his vision improved after his last visit when he received Avastin  Referring physician: [name], MD [place],   [id number]  HISTORICAL INFORMATION:   Selected notes from the [facility] Referred by Dr. Ellin Mayhew for concern of DME OD   CURRENT MEDICATIONS: No current outpatient medications on file. (Ophthalmic Drugs)   No current facility-administered medications for this visit. (Ophthalmic Drugs)   Current Outpatient Medications (Other)  Medication Sig  . apixaban (ELIQUIS) 5 MG TABS tablet Take 1 tablet (5 mg total) by mouth 2 (two) times daily.  Marland Kitchen aspirin EC 81 MG tablet Take 81 mg by mouth daily.  . Calcifediol ER 30 MCG CPCR Take by mouth.  . calcitRIOL (ROCALTROL) 0.25 MCG capsule Take 0.25 mcg by mouth daily.  . carvedilol (COREG) 6.25 MG tablet Take 6.25 mg  by mouth 2 (two) times daily with a meal.  . Continuous Blood Gluc Receiver (FREESTYLE LIBRE 14 DAY READER) DEVI Use 1 Device as directed Dx: E11.29  . Continuous Blood Gluc Sensor (FREESTYLE LIBRE 14 DAY SENSOR) MISC SMARTSIG:1 Kit(s) Topical Every 2 Weeks  . ferrous sulfate 325 (65 FE) MG tablet Take 1 tablet (325 mg total) by mouth 2 (two) times daily with a meal.  . glipiZIDE (GLUCOTROL) 10 MG tablet Take 10 mg by mouth 2 (two) times daily.   Marland Kitchen HYDROcodone-acetaminophen (NORCO) 7.5-325 MG tablet Take 1 tablet by mouth every 6 (six) hours as needed for moderate pain.  Marland Kitchen insulin NPH Human (NOVOLIN N) 100 UNIT/ML injection Inject 5 units before morning mea. Inject 8 units before evening meal.  . insulin NPH-regular Human (NOVOLIN 70/30) (70-30) 100 UNIT/ML injection Inject 15 Units into the skin at bedtime.   . isosorbide mononitrate (IMDUR) 30 MG 24 hr tablet Take 30 mg by mouth daily.   . meclizine (ANTIVERT) 25 MG tablet Take 1 tablet (25 mg total) by mouth 3 (three) times daily as needed for dizziness.  . metolazone (ZAROXOLYN) 5 MG tablet Take 5 mg by mouth daily.  . nitroGLYCERIN (NITROSTAT) 0.4 MG SL tablet Place 0.4 mg under the tongue every 5 (five) minutes as needed for chest pain.  Marland Kitchen  potassium chloride (KLOR-CON) 10 MEQ tablet Take 10 mEq by mouth daily.  Marland Kitchen torsemide (DEMADEX) 20 MG tablet Take 20 mg by mouth 2 (two) times daily.    No current facility-administered medications for this visit. (Other)      REVIEW OF SYSTEMS: ROS    Positive for: Gastrointestinal, Genitourinary, Endocrine, Cardiovascular, Eyes, Respiratory   Negative for: Constitutional, Neurological, Skin, Musculoskeletal, HENT, Psychiatric, Allergic/Imm, Heme/Lymph   Last edited by Matthew Folks, COA on 11/28/2020  1:35 PM. (History)       ALLERGIES Allergies  Allergen Reactions  . Pioglitazone Swelling  . Furosemide Other (See Comments)    Pt may have had swelling in his face per spouse.  Drug  discontinued    . Metformin And Related Diarrhea  . Atorvastatin Rash  . Benazepril Rash  . Latex Rash    (tape only)  . Other Rash    Pt reported that he is allergic to Ace wrap.  Reaction include severe skin irritation. Pt reported that he is allergic to Ace wrap.  Reaction include severe skin irritation.  . Tape Rash    PAST MEDICAL HISTORY Past Medical History:  Diagnosis Date  . Arthritis   . Cataract    Mixed OU  . CHF (congestive heart failure) ([place])   . Chronic kidney disease   . COPD (chronic obstructive pulmonary disease) ([place])   . Diabetes ([place])   . Diabetic retinopathy ([place])    NPDR OU  . Heart disease   . Hypertension   . Hypertensive retinopathy    OU  . Indigestion   . Thrombocytopenia ([place])   . Wears dentures    full upper and lower   Past Surgical History:  Procedure Laterality Date  . APPENDECTOMY    . CARDIAC SURGERY     bypass  . CHOLECYSTECTOMY    . COLONOSCOPY  12/23/2010    FAMILY HISTORY Family History  Problem Relation Age of Onset  . CAD Mother   . Kidney failure Father   . Diabetes Father     SOCIAL HISTORY Social History   Tobacco Use  . Smoking status: Never Smoker  . Smokeless tobacco: Never Used  Vaping Use  . Vaping Use: Never used  Substance Use Topics  . Alcohol use: No  . Drug use: No         OPHTHALMIC EXAM:  Base Eye Exam    Visual Acuity (Snellen - Linear)      Right Left   Dist [place] 20/100 -2 20/30 -2   Dist ph [place] NI NI       Tonometry (Tonopen, 1:48 PM)      Right Left   Pressure 11 12       Pupils      Dark Light Shape React APD   Right 3 2 Round Minimal None   Left 3 2 Round Minimal None       Visual Fields (Counting fingers)      Left Right    Full Full       Extraocular Movement      Right Left    Full, Ortho Full, Ortho       Neuro/Psych    Oriented x3: Yes   Mood/Affect: Normal       Dilation    Both eyes: 1.0% Mydriacyl, 2.5% Phenylephrine @ 1:48 PM        Slit Lamp  and Fundus Exam    Slit Lamp Exam  Right Left   Lids/Lashes Dermatochalasis - upper lid, Meibomian gland dysfunction Dermatochalasis - upper lid, Meibomian gland dysfunction   Conjunctiva/Sclera Nasal and temporal Pinguecula Nasal and temporal Pinguecula   Cornea arcus, trace PEE, mild tear film debris arcus, trace PEE, mild tear film debris   Anterior Chamber deep, narrow temporal angle deep, narrow temporal angle   Iris Round and dilated, No NVI Round and dilated   Lens 2-3+ Nuclear sclerosis with brunescence, 2-3+ Cortical cataract 2-3+ Nuclear sclerosis with brunescence, 2-3+ Cortical cataract   Vitreous Vitreous syneresis Vitreous syneresis, Posterior vitreous detachment, vitreous condensations       Fundus Exam      Right Left   Disc Pink and Sharp, no NVD, temporal PPA Mild pallor, Sharp rim, no NVD, temporal PPA   C/D Ratio 0.5 0.4   Macula Blunted foveal reflex, scattered IRH/DBH, +central edema -- slightly increased Blunted foveal reflex, scattered IRH/DBH, +central edema - slighlty improved   Vessels attenuated, Tortuous attenuated, Tortuous   Periphery Attached, 360IRH/DBH, reticular degeneration Attached, 360 IRH/DBH, reticular degeneration, pigmented CR scar at 0130          IMAGING AND PROCEDURES  Imaging and Procedures for @TODAY @  OCT, Retina - OU - Both Eyes       Right Eye Quality was good. Central Foveal Thickness: 546. Progression has worsened. Findings include abnormal foveal contour, intraretinal fluid, subretinal fluid (Mild interval increase in IRF, interval improvement in SRF).   Left Eye Quality was good. Central Foveal Thickness: 364. Progression has improved. Findings include abnormal foveal contour, intraretinal fluid, subretinal fluid (Interval improvement in IRF/SRF and foveal contour).   Notes *Images captured and stored on drive  Diagnosis / Impression:  +DME OU OD: Mild interval increase in IRF, interval improvement in SRF OS: Interval  improvement in IRF/SRF and foveal contour  Clinical management:  See below  Abbreviations: NFP - Normal foveal profile. CME - cystoid macular edema. PED - pigment epithelial detachment. IRF - intraretinal fluid. SRF - subretinal fluid. EZ - ellipsoid zone. ERM - epiretinal membrane. ORA - outer retinal atrophy. ORT - outer retinal tubulation. SRHM - subretinal hyper-reflective material         Intravitreal Injection, Pharmacologic Agent - OD - Right Eye       Time Out 11/28/2020. 2:13 PM. Confirmed correct patient, procedure, site, and patient consented.   Anesthesia Topical anesthesia was used. Anesthetic medications included Lidocaine 2%, Proparacaine 0.5%.   Procedure Preparation included 5% betadine to ocular surface, eyelid speculum. A supplied needle was used.   Injection:  1.25 mg Bevacizumab (AVASTIN) 1.25mg /0.16mL SOLN   NDC: 61607-371-06, Lot: [id number] , Expiration date: 12/30/2020   Route: Intravitreal, Site: Right Eye, Waste: 0 mL  Post-op Post injection exam found visual acuity of at least counting fingers. The patient tolerated the procedure well. There were no complications. The patient received written and verbal post procedure care education.        Intravitreal Injection, Pharmacologic Agent - OS - Left Eye       Time Out 11/28/2020. 2:14 PM. Confirmed correct patient, procedure, site, and patient consented.   Anesthesia Topical anesthesia was used. Anesthetic medications included Lidocaine 2%, Proparacaine 0.5%.   Procedure Preparation included 5% betadine to ocular surface, eyelid speculum. A (32g) needle was used.   Injection:  1.25 mg Bevacizumab (AVASTIN) 1.25mg /0.69mL SOLN   NDC: 80m, [id number], Expiration date: 01/06/2021   Route: Intravitreal, Site: Left Eye, Waste: 0.05 mL  Post-op Post injection exam found visual acuity  of at least counting fingers. The patient tolerated the procedure well. There were no complications. The  patient received written and verbal post procedure care education.                 ASSESSMENT/PLAN:    ICD-10-CM   1. 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 Retinal edema  H35.81 OCT, Retina - OU - Both Eyes  3. Essential hypertension  I10   4. Hypertensive retinopathy of both eyes  H35.033   5. Combined forms of age-related cataract of both eyes  H25.813     1,2. Severe Non-proliferative diabetic retinopathy, OU  - pt lost to follow up since 06.21.21 (10 mos)  - last A1c was 10.2 on 03.18.22  - exam with scattered heme and edema OU  - s/p IVA OU #1 (06.21.21)  - FA (06.21.21) shows late-leaking MA, vascular perfusion defects, no NV OU  - OCT shows OD: Mild interval increase in IRF, interval improvement in SRF; OS: Interval improvement in IRF/SRF and foveal contour  - recommend IVA OU #2 today, 04.13.22  - pt wishes to proceed with injections  - RBA of procedure discussed, questions answered  - Avastin informed consent obtained, signed and scanned, 06.21.21   - see procedure note  - f/u in 4 wks, DFE, OCT, FA (transit OD), possible injections  3,4. Hypertensive retinopathy OU  - discussed importance of tight BP control  - monitor  5. Mixed cataract OU  - The symptoms of cataract, surgical options, and treatments and risks were discussed with patient.  - discussed diagnosis and progression  - approaching visual significance  - monitor for now  Ophthalmic Meds Ordered this visit:  Meds ordered this encounter  Medications  . Bevacizumab (AVASTIN) SOLN 1.25 mg  . Bevacizumab (AVASTIN) SOLN 1.25 mg      Return in about 4 weeks (around 12/26/2020) for f/u NPDR OU, DFE,  OCT.  There are no Patient Instructions on file for this visit.   Explained the diagnoses, plan, and follow up with the patient and they expressed understanding.  Patient expressed understanding of the importance of proper follow up care.  This document serves as a record of services personally performed by Gardiner Sleeper, MD, PhD. It was created on their behalf by Roselee Nova, COMT. The creation of this record is the provider's dictation and/or activities during the visit.  Electronically signed by: Roselee Nova, COMT 11/28/20 10:22 PM   This document serves as a record of services personally performed by Gardiner Sleeper, MD, PhD. It was created on their behalf by San Jetty. Owens Shark, OA an ophthalmic technician. The creation of this record is the provider's dictation and/or activities during the visit.    Electronically signed by: San Jetty. Marguerita Merles 04.13.2022 10:22 PM  Gardiner Sleeper, M.D., Ph.D. Diseases & Surgery of the Retina and Vitreous [facility]  I have reviewed the above documentation for accuracy and completeness, and I agree with the above. Gardiner Sleeper, M.D., Ph.D. 11/28/20 10:22 PM   Abbreviations: M myopia (nearsighted); A astigmatism; H hyperopia (farsighted); P presbyopia; Mrx spectacle prescription;  CTL contact lenses; OD right eye; OS left eye;  OU both eyes  XT exotropia; ET esotropia; PEK punctate epithelial keratitis; PEE punctate epithelial erosions; DES dry eye syndrome; MGD meibomian gland dysfunction; ATs artificial tears; PFAT's preservative free artificial tears; [place] nuclear sclerotic cataract; PSC posterior subcapsular cataract; ERM epi-retinal membrane; PVD posterior vitreous detachment; RD retinal detachment; DM diabetes mellitus; DR diabetic retinopathy; NPDR non-proliferative diabetic retinopathy; PDR proliferative diabetic retinopathy; CSME clinically significant macular edema; DME diabetic macular edema; dbh dot blot hemorrhages;  CWS cotton wool spot; POAG primary open angle glaucoma; C/D cup-to-disc ratio; HVF humphrey visual field; GVF goldmann visual field; OCT optical coherence tomography; IOP intraocular pressure; BRVO Branch retinal vein occlusion; CRVO central retinal vein occlusion; CRAO central retinal artery occlusion; BRAO branch retinal artery occlusion; RT retinal tear; SB scleral buckle; PPV pars plana vitrectomy; VH Vitreous hemorrhage; PRP panretinal laser photocoagulation; IVK intravitreal kenalog; VMT vitreomacular traction; MH Macular hole;  NVD neovascularization of the disc; NVE neovascularization elsewhere; AREDS age related eye disease study; ARMD age related macular degeneration; POAG primary open angle glaucoma; EBMD epithelial/anterior basement membrane dystrophy; ACIOL anterior chamber intraocular lens; IOL intraocular lens; PCIOL posterior chamber intraocular lens; Phaco/IOL phacoemulsification with intraocular lens placement; [place] photorefractive keratectomy; LASIK laser assisted in situ keratomileusis; HTN hypertension; DM diabetes mellitus; COPD chronic obstructive pulmonary disease

## 2020-11-28 ENCOUNTER — Encounter (INDEPENDENT_AMBULATORY_CARE_PROVIDER_SITE_OTHER): Payer: Self-pay | Admitting: Ophthalmology

## 2020-11-28 ENCOUNTER — Ambulatory Visit (INDEPENDENT_AMBULATORY_CARE_PROVIDER_SITE_OTHER): Payer: Medicare Other | Admitting: Ophthalmology

## 2020-11-28 ENCOUNTER — Other Ambulatory Visit: Payer: Self-pay

## 2020-11-28 DIAGNOSIS — E113413 Type 2 diabetes mellitus with severe nonproliferative diabetic retinopathy with macular edema, bilateral: Secondary | ICD-10-CM | POA: Diagnosis not present

## 2020-11-28 DIAGNOSIS — I10 Essential (primary) hypertension: Secondary | ICD-10-CM

## 2020-11-28 DIAGNOSIS — H35033 Hypertensive retinopathy, bilateral: Secondary | ICD-10-CM

## 2020-11-28 DIAGNOSIS — H3581 Retinal edema: Secondary | ICD-10-CM

## 2020-11-28 DIAGNOSIS — H25813 Combined forms of age-related cataract, bilateral: Secondary | ICD-10-CM

## 2020-11-28 LAB — ACID FAST CULTURE WITH REFLEXED SENSITIVITIES (MYCOBACTERIA): Acid Fast Culture: NEGATIVE

## 2020-11-28 MED ORDER — BEVACIZUMAB CHEMO INJECTION 1.25MG/0.05ML SYRINGE FOR KALEIDOSCOPE
1.2500 mg | INTRAVITREAL | Status: AC | PRN
Start: 2020-11-28 — End: 2020-11-28
  Administered 2020-11-28: 1.25 mg via INTRAVITREAL

## 2020-11-28 MED ORDER — BEVACIZUMAB CHEMO INJECTION 1.25MG/0.05ML SYRINGE FOR KALEIDOSCOPE
1.2500 mg | INTRAVITREAL | Status: AC | PRN
  Administered 2020-11-28: 1.25 mg via INTRAVITREAL

## 2020-12-15 LAB — FUNGUS CULTURE WITH STAIN

## 2020-12-15 LAB — FUNGUS CULTURE RESULT

## 2020-12-15 LAB — FUNGAL ORGANISM REFLEX

## 2020-12-25 NOTE — Progress Notes (Signed)
[facility] - Clinic Note  12/26/2020     CHIEF COMPLAINT Patient presents for Retina Follow Up   HISTORY OF PRESENT ILLNESS: Rick Zuniga is a 75 y.o. male who presents to the clinic today for:   HPI    Retina Follow Up    Patient presents with  Diabetic Retinopathy.  In both eyes.  This started 4 weeks ago.  Since onset it is stable.  I, the attending physician,  performed the HPI with the patient and updated documentation appropriately.          Comments    Pt here for retinal follow up 4wks NPDR. Pt states vision is about the same, has gotten better he thinks but battling some allergy issues. No ocular pain or discomfort. Pt mentioned cataracts, has been considering surgery and wanting to discuss. Has not been scheduled yet. Pt just came from endocrinologist who told him he was doing great. Pts blood sugar is 158 currently,       Last edited by Rennis Chris, MD on 12/26/2020  4:02 PM. (History)    Patient states vision has improved   Referring physician: Gracelyn Nurse, MD [address] [place],  [place] [address]  HISTORICAL INFORMATION:   Selected notes from the MEDICAL RECORD NUMBER Referred by Dr. Clydene Pugh for concern of DME OD   CURRENT MEDICATIONS: No current outpatient medications on file. (Ophthalmic Drugs)   No current facility-administered medications for this visit. (Ophthalmic Drugs)   Current Outpatient Medications (Other)  Medication Sig  . apixaban (ELIQUIS) 5 MG TABS tablet Take 1 tablet (5 mg total) by mouth 2 (two) times daily.  Marland Kitchen aspirin EC 81 MG tablet Take 81 mg by mouth daily.  . calcitRIOL (ROCALTROL) 0.25 MCG capsule Take 0.25 mcg by mouth daily.  . carvedilol (COREG) 6.25 MG tablet Take 6.25 mg by mouth 2 (two) times daily with a meal.  . Continuous Blood Gluc Receiver (FREESTYLE LIBRE 14 DAY READER) DEVI Use 1 Device as directed Dx: E11.29  . Continuous Blood Gluc Sensor (FREESTYLE LIBRE 14 DAY SENSOR) MISC  SMARTSIG:1 Kit(s) Topical Every 2 Weeks  . ferrous sulfate 325 (65 FE) MG tablet Take 1 tablet (325 mg total) by mouth 2 (two) times daily with a meal.  . glipiZIDE (GLUCOTROL) 10 MG tablet Take 10 mg by mouth 2 (two) times daily.   Marland Kitchen HYDROcodone-acetaminophen (NORCO) 7.5-325 MG tablet Take 1 tablet by mouth every 6 (six) hours as needed for moderate pain.  Marland Kitchen insulin NPH Human (NOVOLIN N) 100 UNIT/ML injection Inject 5 units before morning mea. Inject 8 units before evening meal.  . insulin NPH-regular Human (NOVOLIN 70/30) (70-30) 100 UNIT/ML injection Inject 15 Units into the skin at bedtime.   . isosorbide mononitrate (IMDUR) 30 MG 24 hr tablet Take 30 mg by mouth daily.   . meclizine (ANTIVERT) 25 MG tablet Take 1 tablet (25 mg total) by mouth 3 (three) times daily as needed for dizziness.  . metolazone (ZAROXOLYN) 5 MG tablet Take 5 mg by mouth daily.  . nitroGLYCERIN (NITROSTAT) 0.4 MG SL tablet Place 0.4 mg under the tongue every 5 (five) minutes as needed for chest pain.  . potassium chloride (KLOR-CON) 10 MEQ tablet Take 10 mEq by mouth daily.  Marland Kitchen torsemide (DEMADEX) 20 MG tablet Take 20 mg by mouth 2 (two) times daily.    No current facility-administered medications for this visit. (Other)      REVIEW OF SYSTEMS: ROS  Positive for: Gastrointestinal, Genitourinary, Endocrine, Cardiovascular, Eyes, Respiratory   Negative for: Constitutional, Neurological, Skin, Musculoskeletal, HENT, Psychiatric, Allergic/Imm, Heme/Lymph   Last edited by Kingsley Spittle, COT on 12/26/2020  2:35 PM. (History)       ALLERGIES Allergies  Allergen Reactions  . Pioglitazone Swelling  . Furosemide Other (See Comments)    Pt may have had swelling in his face per spouse.  Drug discontinued    . Metformin And Related Diarrhea  . Atorvastatin Rash  . Benazepril Rash  . Latex Rash    (tape only)  . Other Rash    Pt reported that he is allergic to Ace wrap.  Reaction include severe skin  irritation. Pt reported that he is allergic to Ace wrap.  Reaction include severe skin irritation.  . Tape Rash    PAST MEDICAL HISTORY Past Medical History:  Diagnosis Date  . Arthritis   . Cataract    Mixed OU  . CHF (congestive heart failure) ([place])   . Chronic kidney disease   . COPD (chronic obstructive pulmonary disease) ([place])   . Diabetes ([place])   . Diabetic retinopathy ([place])    NPDR OU  . Heart disease   . Hypertension   . Hypertensive retinopathy    OU  . Indigestion   . Thrombocytopenia ([place])   . Wears dentures    full upper and lower   Past Surgical History:  Procedure Laterality Date  . APPENDECTOMY    . CARDIAC SURGERY     bypass  . CHOLECYSTECTOMY    . COLONOSCOPY  12/23/2010    FAMILY HISTORY Family History  Problem Relation Age of Onset  . CAD Mother   . Kidney failure Father   . Diabetes Father     SOCIAL HISTORY Social History   Tobacco Use  . Smoking status: Never Smoker  . Smokeless tobacco: Never Used  Vaping Use  . Vaping Use: Never used  Substance Use Topics  . Alcohol use: No  . Drug use: No         OPHTHALMIC EXAM:  Base Eye Exam    Visual Acuity (Snellen - Linear)      Right Left   Dist [place] 20/80 -1 20/30 -1   Dist ph [place] NI NI       Tonometry (Tonopen, 2:50 PM)      Right Left   Pressure 14 15       Pupils      Dark Light Shape React APD   Right 3 2 Round Minimal None   Left 3 2 Round Minimal None       Visual Fields (Counting fingers)      Left Right    Full Full       Extraocular Movement      Right Left    Full, Ortho Full, Ortho       Neuro/Psych    Oriented x3: Yes   Mood/Affect: Normal       Dilation    Both eyes: 1.0% Mydriacyl, 2.5% Phenylephrine @ 2:50 PM        Slit Lamp and Fundus Exam    Slit Lamp Exam      Right Left   Lids/Lashes Dermatochalasis - upper lid, Meibomian gland dysfunction Dermatochalasis - upper lid, Meibomian gland dysfunction   Conjunctiva/Sclera Nasal and temporal  Pinguecula Nasal and temporal Pinguecula   Cornea arcus, trace PEE, mild tear film debris arcus, trace PEE, mild tear film debris   Anterior Chamber  deep, narrow temporal angle deep, narrow temporal angle   Iris Round and dilated, No NVI Round and dilated   Lens 2-3+ Nuclear sclerosis with brunescence, 2-3+ Cortical cataract 2-3+ Nuclear sclerosis with brunescence, 2-3+ Cortical cataract   Vitreous Vitreous syneresis Vitreous syneresis, Posterior vitreous detachment, vitreous condensations       Fundus Exam      Right Left   Disc Pink and Sharp, no NVD, temporal PPA Mild pallor, Sharp rim, no NVD, temporal PPA   C/D Ratio 0.5 0.4   Macula Blunted foveal reflex, scattered IRH/DBH -- improved, +central edema -- slightly improved Blunted foveal reflex, scattered IRH/DBH, +central edema - slighlty improving   Vessels attenuated, Tortuous attenuated, Tortuous   Periphery Attached, 360IRH/DBH, reticular degeneration Attached, 360 IRH/DBH, reticular degeneration, pigmented CR scar at [place] and Procedures for @TODAY @  OCT, Retina - OU - Both Eyes       Right Eye Quality was good. Central Foveal Thickness: 441. Progression has improved. Findings include abnormal foveal contour, intraretinal fluid, no SRF (Interval improvement in IRF/SRF/foveal profile).   Left Eye Quality was good. Central Foveal Thickness: 324. Progression has improved. Findings include abnormal foveal contour, intraretinal fluid, subretinal fluid (Interval improvement in IRF).   Notes *Images captured and stored on drive  Diagnosis / Impression:  +DME OU OD: Interval improvement in IRF/SRF/foveal profile OS: Interval improvement in IRF  Clinical management:  See below  Abbreviations: NFP - Normal foveal profile. CME - cystoid macular edema. PED - pigment epithelial detachment. IRF - intraretinal fluid. SRF - subretinal fluid. EZ - ellipsoid zone. ERM - epiretinal membrane.  ORA - outer retinal atrophy. ORT - outer retinal tubulation. SRHM - subretinal hyper-reflective material         Intravitreal Injection, Pharmacologic Agent - OD - Right Eye       Time Out 12/26/2020. 3:18 PM. Confirmed correct patient, procedure, site, and patient consented.   Anesthesia Topical anesthesia was used. Anesthetic medications included Lidocaine 2%, Proparacaine 0.5%.   Procedure Preparation included 5% betadine to ocular surface, eyelid speculum. A supplied needle was used.   Injection:  1.25 mg Bevacizumab (AVASTIN) 1.25mg /0.35mL SOLN   NDC: 28366-294-76, Lot: [id number], Expiration date: 01/26/2021   Route: Intravitreal, Site: Right Eye, Waste: 0 mL  Post-op Post injection exam found visual acuity of at least counting fingers. The patient tolerated the procedure well. There were no complications. The patient received written and verbal post procedure care education.        Intravitreal Injection, Pharmacologic Agent - OS - Left Eye       Time Out 12/26/2020. 3:34 PM. Confirmed correct patient, procedure, site, and patient consented.   Anesthesia Topical anesthesia was used. Anesthetic medications included Lidocaine 2%, Proparacaine 0.5%.   Procedure Preparation included 5% betadine to ocular surface, eyelid speculum. A (32g) needle was used.   Injection:  1.25 mg Bevacizumab (AVASTIN) 1.25mg /0.56mL SOLN   NDC: H061816, Lot: [id number], Expiration date: 02/27/2021   Route: Intravitreal, Site: Left Eye, Waste: 0.05 mL  Post-op Post injection exam found visual acuity of at least counting fingers. The patient tolerated the procedure well. There were no complications. The patient received written and verbal post procedure care education.        Fluorescein Angiography Optos (Transit OD)       Right Eye   Progression has no prior data. Early phase findings include microaneurysm, vascular perfusion defect. Mid/Late phase findings  include microaneurysm,  leakage (No NV).   Left Eye   Progression has no prior data. Early phase findings include microaneurysm, vascular perfusion defect. Mid/Late phase findings include leakage, microaneurysm (No NV).   Notes **Images stored on drive**  Impression: Severe NPDR OU Late leaking MA OU No NV OU                  ASSESSMENT/PLAN:    ICD-10-CM   1. 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 Retinal edema  H35.81 OCT, Retina - OU - Both Eyes  3. Essential hypertension  I10   4. Hypertensive retinopathy of both eyes  H35.033 Fluorescein Angiography Optos (Transit OD)  5. Combined forms of age-related cataract of both eyes  H25.813     1,2. Severe Non-proliferative diabetic retinopathy, OU  - pt lost to follow up from 06.21.21 to 04.13.22 (10 mos)  - last A1c was 10.2 on 03.18.22  - exam with scattered heme and edema OU  - s/p IVA OU #1 (06.21.21), #2 (04.13.22)  - FA (06.21.21) shows late-leaking MA, vascular perfusion defects, no NV OU  - OCT shows OD: Interval improvement in IRF/SRF/foveal profile; OS: Interval improvement in IRF  - recommend IVA OU #3 today, 05.11.22  - pt wishes to proceed with injections  - RBA of procedure discussed, questions answered  - Avastin informed consent obtained, signed and scanned, 06.21.21   - see procedure note  - f/u in 4 wks, DFE, OCT, possible injections  3,4. Hypertensive retinopathy OU  - discussed importance of tight BP control  - monitor  5. Mixed cataract OU  - The symptoms of cataract, surgical options, and treatments and risks were discussed with patient.  - discussed diagnosis and progression  - approaching visual significance  - pt  requests referral to Dr. Wallace Going at [facility] Ordered this visit:  Meds ordered this encounter  Medications  . Bevacizumab (AVASTIN) SOLN 1.25 mg  . Bevacizumab (AVASTIN) SOLN 1.25 mg      Return in about 4 weeks (around 01/23/2021) for f/u NPDR OU, DFE, OCT.  There are no Patient Instructions on file for this visit.   Explained the diagnoses, plan, and follow up with the patient and they expressed understanding.  Patient expressed understanding of the importance of proper follow up care.  This document serves as a record of services personally performed by Gardiner Sleeper, MD, PhD. It was created on their behalf by Roselee Nova, COMT. The creation of this record is the provider's dictation and/or activities during the visit.  Electronically signed by: Roselee Nova, COMT 12/26/20 4:08 PM   This document serves as a record of services personally performed by Gardiner Sleeper, MD, PhD. It was created on their behalf by San Jetty. Owens Shark, OA an ophthalmic technician. The creation of this record is the provider's dictation and/or activities during the visit.    Electronically signed by: San Jetty. Owens Shark, [place] 05.11.2022 4:08 PM  Gardiner Sleeper, M.D., Ph.D. Diseases & Surgery of the Retina and Vitreous [facility]  I have reviewed the above documentation for accuracy and completeness, and I agree with the above. Sharyon Cable  Coralyn Pear, M.D., Ph.D. 12/26/20 4:08 PM  Abbreviations: M myopia (nearsighted); A astigmatism; H hyperopia (farsighted); P presbyopia; Mrx spectacle prescription;  CTL contact lenses; OD right eye; OS left eye; OU both eyes  XT exotropia; ET esotropia; PEK punctate epithelial keratitis; PEE punctate epithelial erosions; DES dry eye syndrome; MGD meibomian gland dysfunction; ATs artificial tears; PFAT's preservative free artificial tears; [place] nuclear sclerotic cataract; PSC posterior subcapsular cataract; ERM epi-retinal membrane;  PVD posterior vitreous detachment; RD retinal detachment; DM diabetes mellitus; DR diabetic retinopathy; NPDR non-proliferative diabetic retinopathy; PDR proliferative diabetic retinopathy; CSME clinically significant macular edema; DME diabetic macular edema; dbh dot blot hemorrhages; CWS cotton wool spot; POAG primary open angle glaucoma; C/D cup-to-disc ratio; HVF humphrey visual field; GVF goldmann visual field; OCT optical coherence tomography; IOP intraocular pressure; BRVO Branch retinal vein occlusion; CRVO central retinal vein occlusion; CRAO central retinal artery occlusion; BRAO branch retinal artery occlusion; RT retinal tear; SB scleral buckle; PPV pars plana vitrectomy; VH Vitreous hemorrhage; PRP panretinal laser photocoagulation; IVK intravitreal kenalog; VMT vitreomacular traction; MH Macular hole;  NVD neovascularization of the disc; NVE neovascularization elsewhere; AREDS age related eye disease study; ARMD age related macular degeneration; POAG primary open angle glaucoma; EBMD epithelial/anterior basement membrane dystrophy; ACIOL anterior chamber intraocular lens; IOL intraocular lens; PCIOL posterior chamber intraocular lens; Phaco/IOL phacoemulsification with intraocular lens placement; [place] photorefractive keratectomy; LASIK laser assisted in situ keratomileusis; HTN hypertension; DM diabetes mellitus; COPD chronic obstructive pulmonary disease

## 2020-12-26 ENCOUNTER — Encounter (INDEPENDENT_AMBULATORY_CARE_PROVIDER_SITE_OTHER): Payer: Self-pay | Admitting: Ophthalmology

## 2020-12-26 ENCOUNTER — Other Ambulatory Visit: Payer: Self-pay

## 2020-12-26 ENCOUNTER — Ambulatory Visit (INDEPENDENT_AMBULATORY_CARE_PROVIDER_SITE_OTHER): Payer: Medicare Other | Admitting: Ophthalmology

## 2020-12-26 ENCOUNTER — Encounter (INDEPENDENT_AMBULATORY_CARE_PROVIDER_SITE_OTHER): Payer: Medicare Other | Admitting: Ophthalmology

## 2020-12-26 DIAGNOSIS — E113413 Type 2 diabetes mellitus with severe nonproliferative diabetic retinopathy with macular edema, bilateral: Secondary | ICD-10-CM | POA: Diagnosis not present

## 2020-12-26 DIAGNOSIS — H35033 Hypertensive retinopathy, bilateral: Secondary | ICD-10-CM | POA: Diagnosis not present

## 2020-12-26 DIAGNOSIS — H3581 Retinal edema: Secondary | ICD-10-CM

## 2020-12-26 DIAGNOSIS — I10 Essential (primary) hypertension: Secondary | ICD-10-CM

## 2020-12-26 DIAGNOSIS — H25813 Combined forms of age-related cataract, bilateral: Secondary | ICD-10-CM

## 2020-12-26 MED ORDER — BEVACIZUMAB CHEMO INJECTION 1.25MG/0.05ML SYRINGE FOR KALEIDOSCOPE
1.2500 mg | INTRAVITREAL | Status: AC | PRN
  Administered 2020-12-26: 1.25 mg via INTRAVITREAL

## 2020-12-27 ENCOUNTER — Other Ambulatory Visit (INDEPENDENT_AMBULATORY_CARE_PROVIDER_SITE_OTHER): Payer: Self-pay

## 2020-12-27 MED ORDER — ERYTHROMYCIN 5 MG/GM OP OINT: 1.0000 "application " | TOPICAL_OINTMENT | Freq: Four times a day (QID) | OPHTHALMIC | 0 refills | Status: AC

## 2020-12-28 LAB — ACID FAST CULTURE WITH REFLEXED SENSITIVITIES (MYCOBACTERIA): Acid Fast Culture: NEGATIVE

## 2021-01-02 ENCOUNTER — Other Ambulatory Visit: Payer: Self-pay | Admitting: Pulmonary Disease

## 2021-01-02 DIAGNOSIS — J90 Pleural effusion, not elsewhere classified: Secondary | ICD-10-CM

## 2021-01-03 ENCOUNTER — Ambulatory Visit
Admission: RE | Admit: 2021-01-03 | Discharge: 2021-01-03 | Disposition: A | Discharge status: Home / Self Care | Payer: Medicare Other | Source: Ambulatory Visit | Attending: Pulmonary Disease | Admitting: Pulmonary Disease

## 2021-01-03 ENCOUNTER — Other Ambulatory Visit: Payer: Self-pay

## 2021-01-03 ENCOUNTER — Other Ambulatory Visit: Payer: Self-pay | Admitting: Pulmonary Disease

## 2021-01-03 DIAGNOSIS — Z951 Presence of aortocoronary bypass graft: Secondary | ICD-10-CM | POA: Diagnosis not present

## 2021-01-03 DIAGNOSIS — J90 Pleural effusion, not elsewhere classified: Secondary | ICD-10-CM | POA: Insufficient documentation

## 2021-01-03 DIAGNOSIS — Z9889 Other specified postprocedural states: Secondary | ICD-10-CM | POA: Insufficient documentation

## 2021-01-03 LAB — PROTEIN, PLEURAL OR PERITONEAL FLUID: Total protein, fluid: <3 g/dL

## 2021-01-03 LAB — LACTATE DEHYDROGENASE, PLEURAL OR PERITONEAL FLUID: LD, Fluid: 80 U/L — ABNORMAL HIGH (ref 3–23)

## 2021-01-03 LAB — GLUCOSE, PLEURAL OR PERITONEAL FLUID: Glucose, Fluid: 164 mg/dL

## 2021-01-03 LAB — BODY FLUID CELL COUNT WITH DIFFERENTIAL
Eos, Fluid: 0 %
Lymphs, Fluid: 66 %
Monocyte-Macrophage-Serous Fluid: 28 %
Neutrophil Count, Fluid: 6 %
Total Nucleated Cell Count, Fluid: 65 cu mm

## 2021-01-03 LAB — AMYLASE, PLEURAL OR PERITONEAL FLUID: Amylase, Fluid: 20 U/L

## 2021-01-03 NOTE — Procedures (Signed)
  Procedure: [place] L thoracentesis   EBL:   minimal Complications:  none immediate  See full dictation in [facility].  Dillard Cannon MD Main # [phone] Pager  [phone] Mobile [phone]

## 2021-01-04 LAB — PROTEIN, BODY FLUID (OTHER): Total Protein, Body Fluid Other: 2.7 g/dL

## 2021-01-04 LAB — CYTOLOGY - NON PAP

## 2021-01-04 LAB — PH, BODY FLUID: pH, Body Fluid: 7.4

## 2021-01-04 LAB — ACID FAST SMEAR (AFB, MYCOBACTERIA): Acid Fast Smear: NEGATIVE

## 2021-01-06 LAB — BODY FLUID CULTURE W GRAM STAIN: Culture: NO GROWTH

## 2021-01-06 LAB — CHOLESTEROL, BODY FLUID: Cholesterol, Fluid: 27 mg/dL

## 2021-01-11 ENCOUNTER — Ambulatory Visit: Payer: Medicare Other

## 2021-01-23 ENCOUNTER — Encounter (INDEPENDENT_AMBULATORY_CARE_PROVIDER_SITE_OTHER): Payer: Medicare Other | Admitting: Ophthalmology

## 2021-01-23 ENCOUNTER — Encounter (INDEPENDENT_AMBULATORY_CARE_PROVIDER_SITE_OTHER): Payer: Self-pay

## 2021-01-31 LAB — FUNGAL ORGANISM REFLEX

## 2021-01-31 LAB — FUNGUS CULTURE RESULT

## 2021-01-31 LAB — FUNGUS CULTURE WITH STAIN

## 2021-02-17 LAB — ACID FAST CULTURE WITH REFLEXED SENSITIVITIES (MYCOBACTERIA): Acid Fast Culture: NEGATIVE

## 2021-04-05 ENCOUNTER — Other Ambulatory Visit: Payer: Self-pay | Admitting: Pulmonary Disease

## 2021-04-05 DIAGNOSIS — J90 Pleural effusion, not elsewhere classified: Secondary | ICD-10-CM

## 2021-04-08 ENCOUNTER — Ambulatory Visit
Admission: RE | Admit: 2021-04-08 | Discharge: 2021-04-08 | Disposition: A | Discharge status: Home / Self Care | Payer: Medicare Other | Source: Ambulatory Visit | Attending: Radiology | Admitting: Radiology

## 2021-04-08 ENCOUNTER — Ambulatory Visit
Admission: RE | Admit: 2021-04-08 | Discharge: 2021-04-08 | Disposition: A | Discharge status: Home / Self Care | Payer: Medicare Other | Source: Ambulatory Visit | Attending: Pulmonary Disease | Admitting: Pulmonary Disease

## 2021-04-08 DIAGNOSIS — J90 Pleural effusion, not elsewhere classified: Secondary | ICD-10-CM | POA: Diagnosis not present

## 2021-04-08 DIAGNOSIS — Z9889 Other specified postprocedural states: Secondary | ICD-10-CM

## 2021-04-08 LAB — AMYLASE, PLEURAL OR PERITONEAL FLUID: Amylase, Fluid: 27 U/L

## 2021-04-08 LAB — BODY FLUID CELL COUNT WITH DIFFERENTIAL
Eos, Fluid: 2 %
Lymphs, Fluid: 76 %
Monocyte-Macrophage-Serous Fluid: 15 %
Neutrophil Count, Fluid: 7 %
Other Cells, Fluid: 0 %
Total Nucleated Cell Count, Fluid: 83 cu mm

## 2021-04-08 LAB — LACTATE DEHYDROGENASE, PLEURAL OR PERITONEAL FLUID: LD, Fluid: 92 U/L — ABNORMAL HIGH (ref 3–23)

## 2021-04-08 LAB — PROTEIN, PLEURAL OR PERITONEAL FLUID: Total protein, fluid: <3 g/dL

## 2021-04-08 LAB — GLUCOSE, PLEURAL OR PERITONEAL FLUID: Glucose, Fluid: 213 mg/dL

## 2021-04-08 NOTE — Procedures (Signed)
PROCEDURE SUMMARY:  Successful US guided Left thoracentesis. Yielded 1200 mL's of clear yellow fluid. Pt tolerated procedure well. No immediate complications.  Specimen was sent for labs. CXR ordered.  EBL < 5 mL  Hedy Jacob PA-C 04/08/2021 3:15 PM

## 2021-04-09 LAB — PROTEIN, BODY FLUID (OTHER): Total Protein, Body Fluid Other: 3 g/dL

## 2021-04-10 LAB — ACID FAST SMEAR (AFB, MYCOBACTERIA): Acid Fast Smear: NEGATIVE

## 2021-04-10 LAB — CYTOLOGY - NON PAP

## 2021-04-12 LAB — CHOLESTEROL, BODY FLUID: Cholesterol, Fluid: 32 mg/dL

## 2021-04-17 LAB — PH, BODY FLUID: pH, Body Fluid: 7.6

## 2021-04-18 ENCOUNTER — Other Ambulatory Visit: Payer: Self-pay | Admitting: Thoracic Surgery (Cardiothoracic Vascular Surgery)

## 2021-04-18 ENCOUNTER — Encounter: Payer: Medicare Other | Admitting: Thoracic Surgery (Cardiothoracic Vascular Surgery)

## 2021-04-18 DIAGNOSIS — J90 Pleural effusion, not elsewhere classified: Secondary | ICD-10-CM

## 2021-04-19 ENCOUNTER — Encounter: Payer: Medicare Other | Admitting: Thoracic Surgery (Cardiothoracic Vascular Surgery)

## 2021-04-24 IMAGING — CR DG CHEST 2V
2 series · 2 of 2 positions shown · non-contrast
Comparison: February 13, 2020

CLINICAL DATA: Congestive heart failure.

EXAM:
CHEST - 2 VIEW

[chest lat]
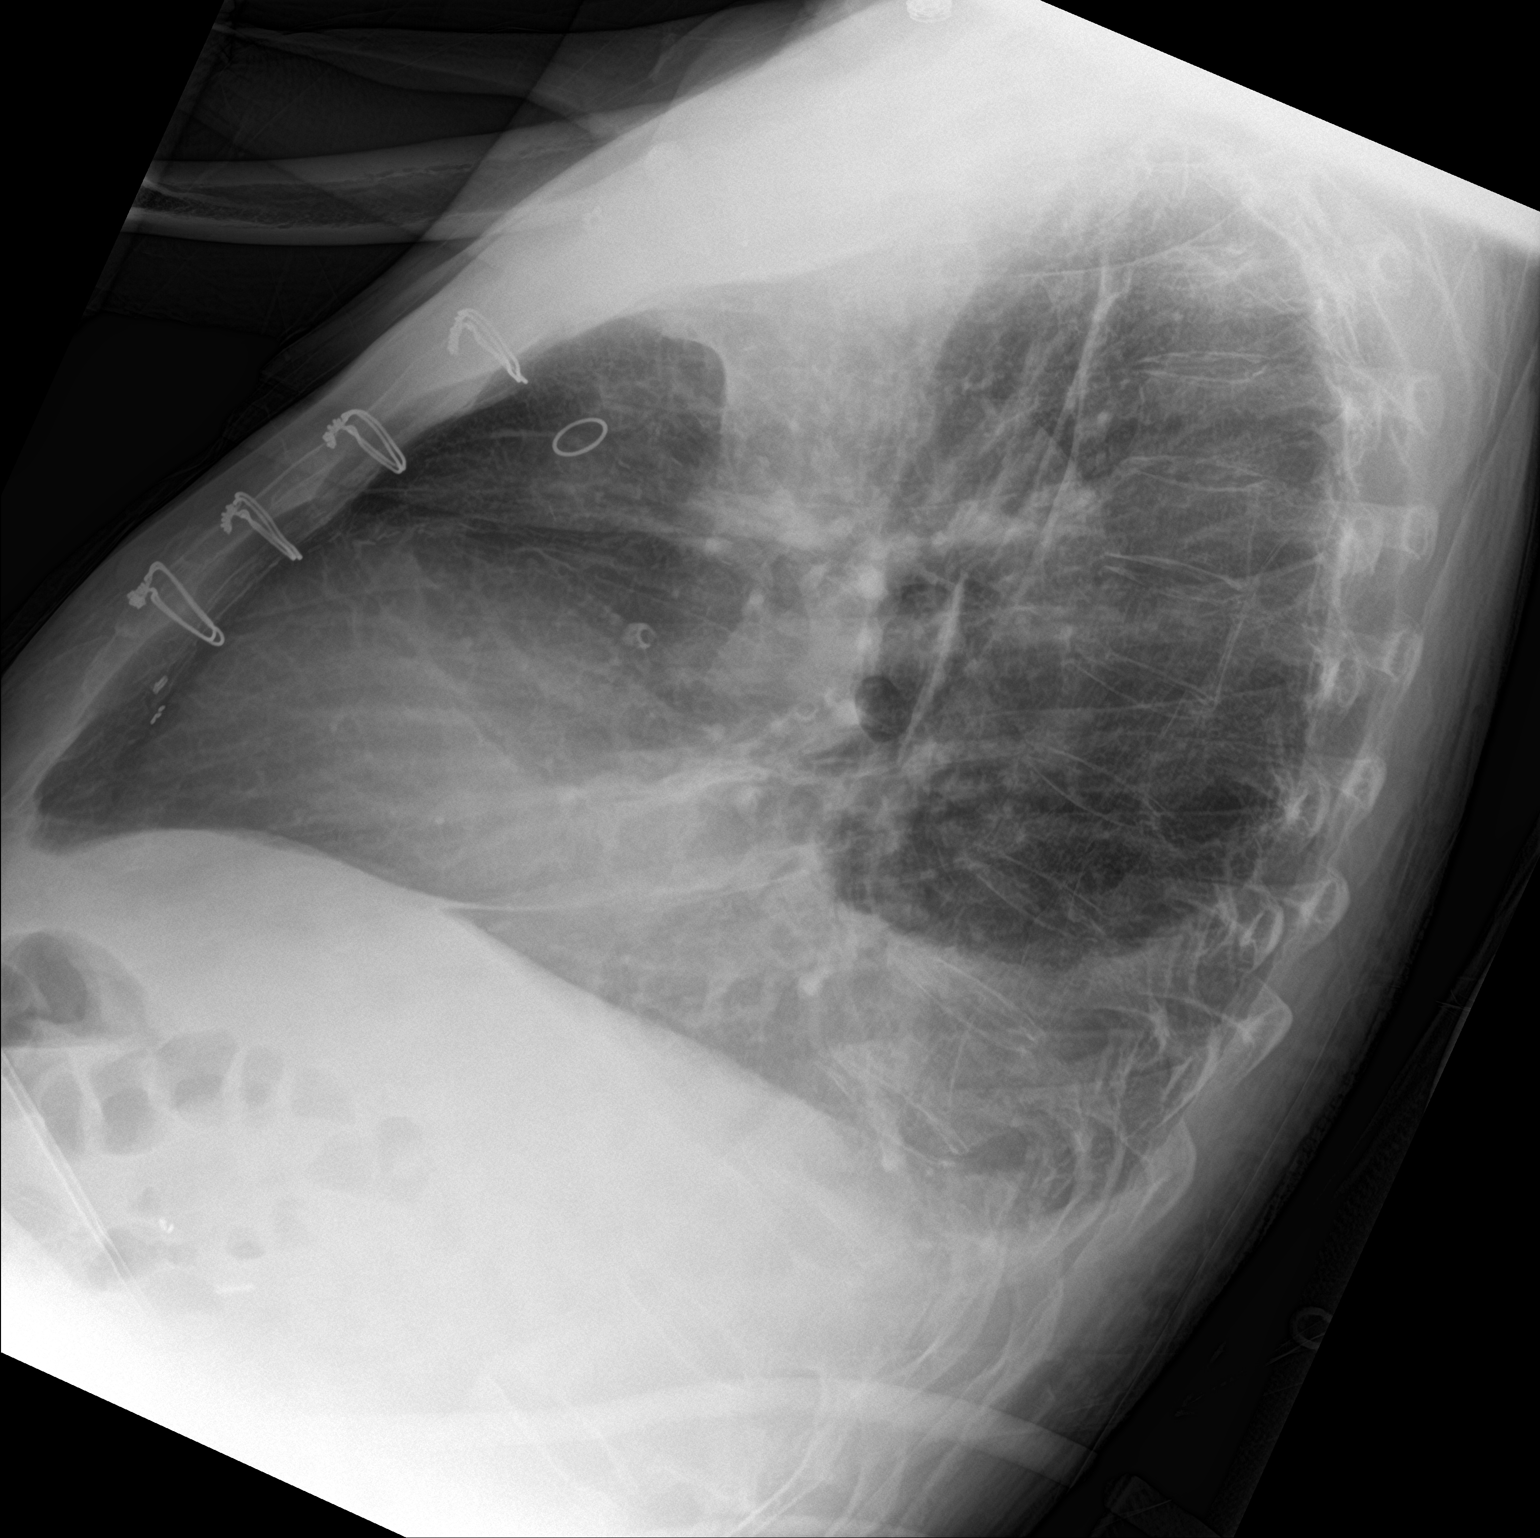

[chest ap]
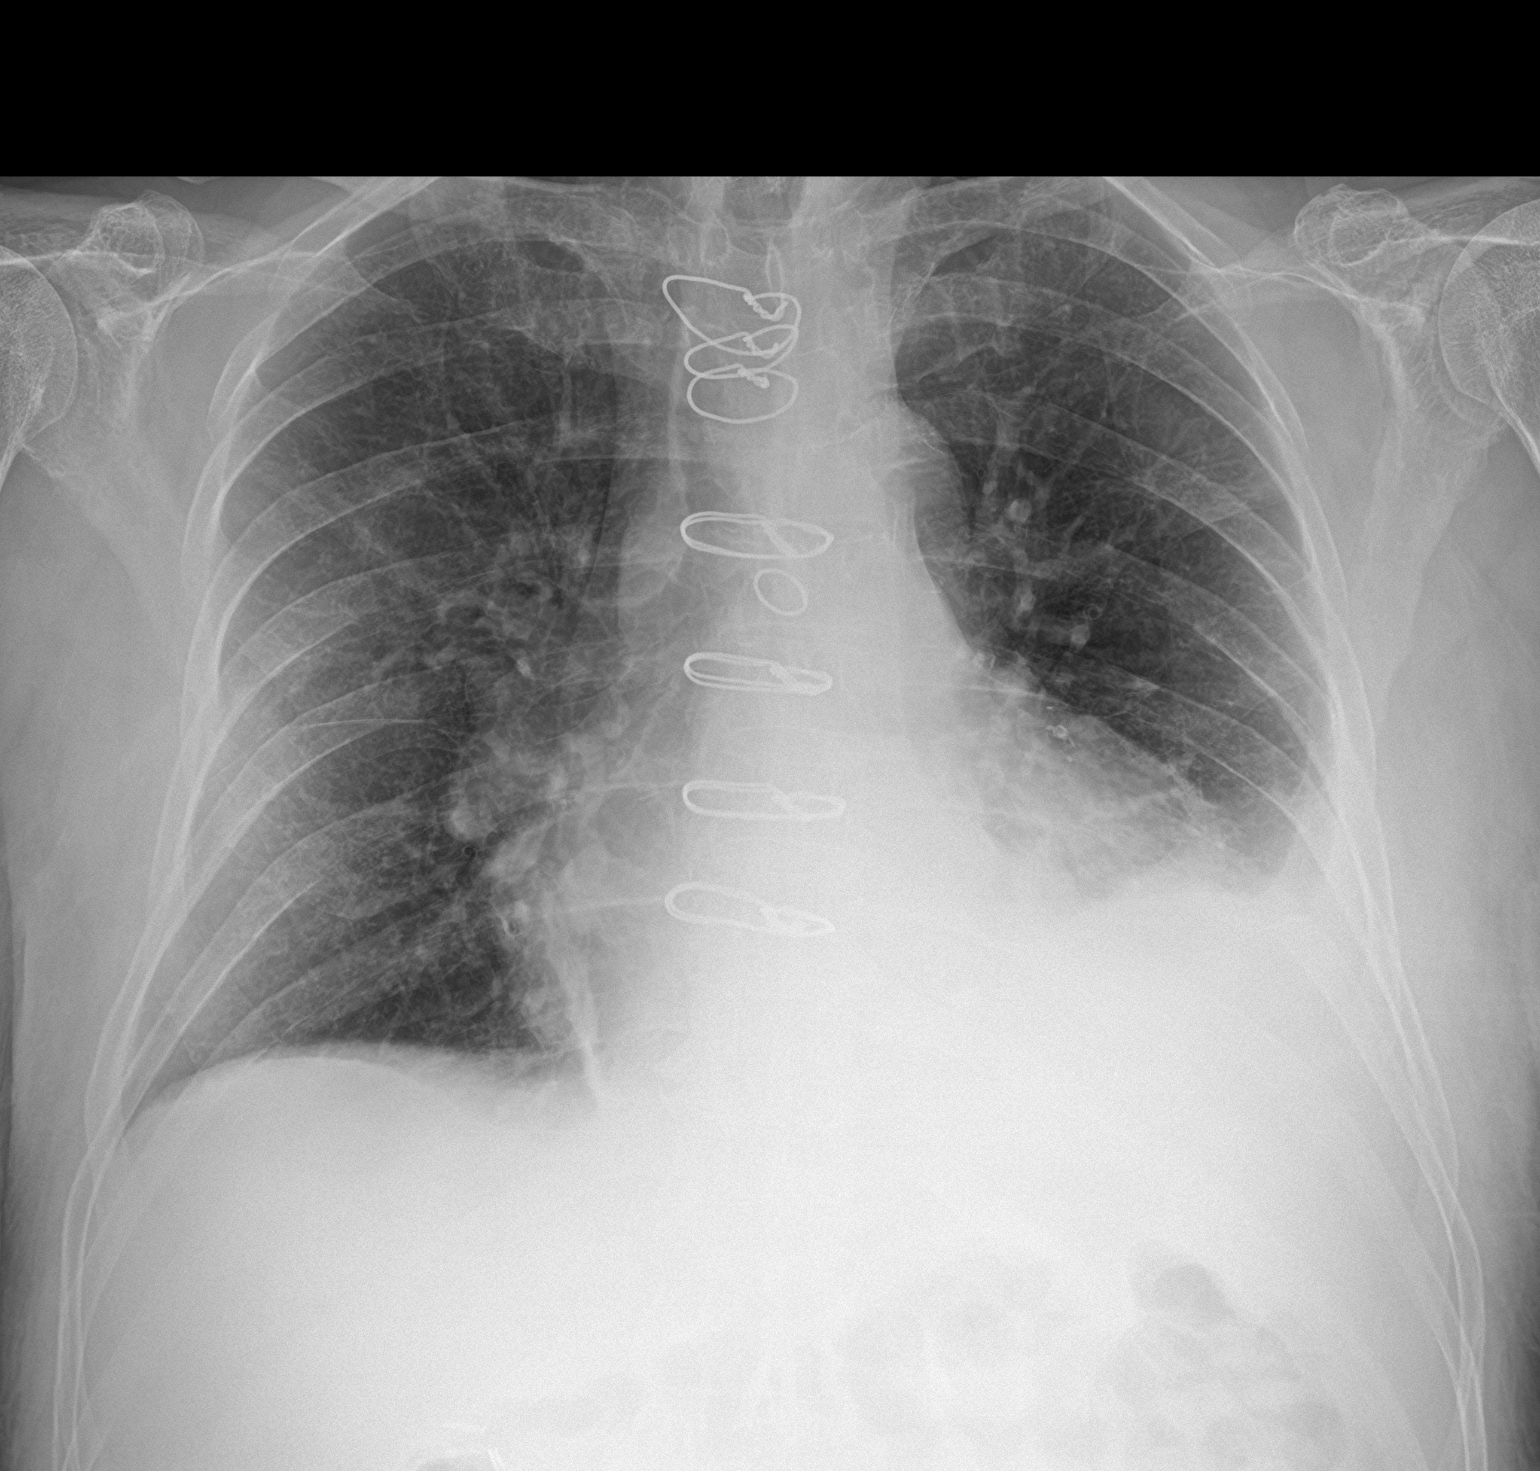

[2 of 2 positions shown; findings below may reference images not displayed]

FINDINGS: The heart size remains enlarged. The patient is status post prior
median sternotomy. There is a moderate-sized left-sided pleural
effusion with adjacent atelectasis. There is no pneumothorax. There
may be a small right-sided pleural effusion. There is vascular
congestion.
IMPRESSION: No significant interval change. Persistent cardiomegaly with small
to moderate-sized bilateral pleural effusions, left greater than
right.

## 2021-04-30 ENCOUNTER — Encounter: Payer: Medicare Other | Admitting: Thoracic Surgery (Cardiothoracic Vascular Surgery)

## 2021-05-01 ENCOUNTER — Encounter: Payer: Medicare Other | Admitting: Thoracic Surgery (Cardiothoracic Vascular Surgery)

## 2021-05-02 ENCOUNTER — Ambulatory Visit
Admission: RE | Admit: 2021-05-02 | Discharge: 2021-05-02 | Disposition: A | Discharge status: Home / Self Care | Payer: Medicare Other | Source: Ambulatory Visit | Attending: Thoracic Surgery (Cardiothoracic Vascular Surgery) | Admitting: Thoracic Surgery (Cardiothoracic Vascular Surgery)

## 2021-05-02 ENCOUNTER — Other Ambulatory Visit: Payer: Self-pay

## 2021-05-02 ENCOUNTER — Encounter: Payer: Self-pay | Admitting: Thoracic Surgery (Cardiothoracic Vascular Surgery)

## 2021-05-02 ENCOUNTER — Institutional Professional Consult (permissible substitution) (INDEPENDENT_AMBULATORY_CARE_PROVIDER_SITE_OTHER): Payer: Medicare Other | Admitting: Thoracic Surgery (Cardiothoracic Vascular Surgery)

## 2021-05-02 VITALS — BP 150/83 | HR 71 | Resp 20 | Ht 73.0 in | Wt 166.6 lb

## 2021-05-02 DIAGNOSIS — J90 Pleural effusion, not elsewhere classified: Secondary | ICD-10-CM

## 2021-05-02 NOTE — Progress Notes (Signed)
[address]       [place] [address]             [phone]      HPI: Rick Zuniga returns further discuss placement of a pleural catheter for his recurrent pleural effusions.  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  He has had issues with recurrent left pleural effusions for almost a year now.  He has had over half a dozen thoracenteses.  With these thoracentesis he has had between 1500- 2,000 mL removed.  It is always been serous in nature.  He does experience symptomatic relief which can last for a month or 2.  Other times he can be symptomatic again within a couple of weeks.  His most recent drainage was on 04/08/2021.  About 1500 mL of fluid was removed.  Findings overall are consistent with transudate although the LDH was mildly elevated.  He has had multiple negative cytologies.  He has congestive heart failure and stage IV chronic kidney disease.  He says Dr. Saralyn Pilar told him his heart was okay.  From reading his note from 02/20/2021 he recommended a stress test but the patient declined.  There was a plan to do an echocardiogram, but I do not see any evidence that that was done in Care Everywhere.  The most recent BNP level I can find was from October 2021 and was 1490.  His chronic kidney disease has been stable with a creatinine about 2.4 and a GFR of 26.  He says that he has had a lot of pain after his most recent thoracenteses.  It does not hurt during the procedure arrest of fluids strain but later when the local wears off he has pain where the needle was placed.  He says he been having right-sided chest pain recently.  Past Medical History:  Diagnosis Date   Arthritis    Cataract    Mixed OU   CHF  (congestive heart failure) (HCC)    Chronic kidney disease    COPD (chronic obstructive pulmonary disease) (HCC)    Diabetes ([place])    Diabetic retinopathy ([place])    NPDR OU   Heart disease    Hypertension    Hypertensive retinopathy    OU   Indigestion    Thrombocytopenia (HCC)    Wears dentures    full upper and lower   Past Surgical History:  Procedure Laterality Date   APPENDECTOMY     CARDIAC SURGERY     bypass   CHOLECYSTECTOMY     COLONOSCOPY  12/23/2010    Current Outpatient Medications  Medication Sig Dispense Refill   apixaban (ELIQUIS) 5 MG TABS tablet Take 1 tablet (5 mg total) by mouth 2 (two) times daily. 60 tablet 1   aspirin EC 81 MG tablet Take 81 mg by mouth daily.     carvedilol (COREG) 6.25 MG tablet Take 6.25 mg by mouth 2 (two) times daily with a meal.     Continuous Blood Gluc Receiver (FREESTYLE LIBRE 14 DAY READER) DEVI Use 1 Device as directed Dx: E11.29     Continuous Blood Gluc Sensor (FREESTYLE LIBRE 14 DAY SENSOR) MISC SMARTSIG:1 Kit(s) Topical Every 2 Weeks     glipiZIDE (GLUCOTROL) 10 MG tablet Take 10 mg by mouth 2 (  two) times daily.      HYDROcodone-acetaminophen (NORCO) 7.5-325 MG tablet Take 1 tablet by mouth every 6 (six) hours as needed for moderate pain.     insulin NPH Human (NOVOLIN N) 100 UNIT/ML injection Inject 5 units before morning mea. Inject 8 units before evening meal.     isosorbide mononitrate (IMDUR) 30 MG 24 hr tablet Take 30 mg by mouth daily.      meclizine (ANTIVERT) 25 MG tablet Take 1 tablet (25 mg total) by mouth 3 (three) times daily as needed for dizziness. 30 tablet 0   nitroGLYCERIN (NITROSTAT) 0.4 MG SL tablet Place 0.4 mg under the tongue every 5 (five) minutes as needed for chest pain.     torsemide (DEMADEX) 20 MG tablet Take 20 mg by mouth 2 (two) times daily.      No current facility-administered medications for this visit.    Physical Exam BP (!) 150/83 (BP Location: Left Arm, Patient Position: Sitting, Cuff  Size: Normal)   Pulse 71   Resp 20   Ht _0  (1.854 m)   Wt 166 lb 9.6 oz (75.6 kg)   SpO2 98%   BMI 21.98 kg/m  Elderly man in no acute distress Alert and oriented x3 with no focal deficits No JVD Cardiac regular rate and rhythm, faint murmur Lungs slightly diminished at left base but otherwise clear Trace peripheral edema  Diagnostic Tests: I reviewed the CT chest from [facility] that came on CD.  There are several small lung nodules.  No suspicious nodules.  There was a moderate pleural effusion at that time.  There was some compressive atelectasis.  His chest x-ray today shows a small left pleural effusion.  No fluid on the right side.  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   Recurrent left pleural effusions-have been a problem dating back about a year.  He has had numerous thoracenteses.  Fluid mostly consistent with a transudate.  Likely due to a combination of his congestive heart failure and stage IV chronic kidney disease.  It is hard to tell how much those might be a factor.  I offered him the option of placement of a pleural catheter for symptomatic management of the effusion.  Hopefully over time he would develop pleural synthesis and stop collecting fluid in the left pleural space.  However, I warned him that if the fluid stopped collecting in the left pleural space, it is likely to collect elsewhere, because the underlying problem is the kidneys and/or heart not the lung.  I am going to order a BNP just to see where that is because all the ones in our system have been markedly elevated.  There may not be much that can be done about that due to his chronic kidney disease.  I explained the basic procedure of placement  of the pleural catheter.  He would need to hold his Eliquis for 2 days prior to the procedure.  We will do it in the operating room with IV sedation and local anesthesia.  We will plan to do it on an outpatient basis.  I informed him of the indications, risks, benefits, and alternatives.  He understands the risks include, but not limited to bleeding, injury to underlying structures, and infection.  He wishes to proceed with pleural catheter  placement.  There is not enough fluid currently to do the procedure safely.  We have to wait for the effusion to reaccumulate to some degree.  I will plan to see him back with a chest x-ray in 3 weeks to see if there is enough fluid to schedule catheter placement.  Plan: Return in 3 weeks with PA lateral chest x-ray  Melrose Nakayama, MD [facility] [phone]

## 2021-05-08 LAB — FUNGUS CULTURE RESULT

## 2021-05-08 LAB — FUNGUS CULTURE WITH STAIN

## 2021-05-08 LAB — FUNGAL ORGANISM REFLEX

## 2021-05-22 LAB — ACID FAST CULTURE WITH REFLEXED SENSITIVITIES (MYCOBACTERIA): Acid Fast Culture: NEGATIVE

## 2021-05-27 ENCOUNTER — Other Ambulatory Visit: Payer: Self-pay | Admitting: *Deleted

## 2021-05-27 DIAGNOSIS — J90 Pleural effusion, not elsewhere classified: Secondary | ICD-10-CM

## 2021-05-28 ENCOUNTER — Other Ambulatory Visit: Payer: Self-pay

## 2021-05-28 ENCOUNTER — Ambulatory Visit (INDEPENDENT_AMBULATORY_CARE_PROVIDER_SITE_OTHER): Payer: Medicare Other | Admitting: Thoracic Surgery (Cardiothoracic Vascular Surgery)

## 2021-05-28 ENCOUNTER — Other Ambulatory Visit: Payer: Self-pay | Admitting: Thoracic Surgery (Cardiothoracic Vascular Surgery)

## 2021-05-28 ENCOUNTER — Ambulatory Visit
Admission: RE | Admit: 2021-05-28 | Discharge: 2021-05-28 | Disposition: A | Discharge status: Home / Self Care | Payer: Medicare Other | Source: Ambulatory Visit | Attending: Thoracic Surgery (Cardiothoracic Vascular Surgery) | Admitting: Thoracic Surgery (Cardiothoracic Vascular Surgery)

## 2021-05-28 VITALS — BP 120/64 | HR 63 | Resp 20 | Ht 73.0 in | Wt 178.0 lb

## 2021-05-28 DIAGNOSIS — I160 Hypertensive urgency: Secondary | ICD-10-CM

## 2021-05-28 DIAGNOSIS — J90 Pleural effusion, not elsewhere classified: Secondary | ICD-10-CM

## 2021-05-28 NOTE — Progress Notes (Signed)
[address]       [place] [address]             [phone]       HPI: [name] returns for further follow-up regarding his left pleural effusion.  Rick Zuniga is a 75 year old man with a history of coronary disease, coronary bypass grafting, chronic congestive heart failure, COPD, stage IV chronic kidney disease, DVT, type 2 diabetes, diabetic nephropathy, diabetic retinopathy, and thrombocytopenia.  He has been having difficulty with recurrent pleural effusions for a year now.  Most recently it was drained in August.  He wanted to proceed with a pleural catheter placement.  I recommended that he wait until the fluid had reaccumulated and we will recheck a chest x-ray and then we can schedule the procedure.  He says that he has been getting progressively more short of breath.  It is interfering with his activities.  He is also having some left-sided chest pain.  Past Medical History:  Diagnosis Date   Arthritis    Cataract    Mixed OU   CHF (congestive heart failure) (HCC)    Chronic kidney disease    COPD (chronic obstructive pulmonary disease) (HCC)    Diabetes ([place])    Diabetic retinopathy ([place])    NPDR OU   Heart disease    Hypertension    Hypertensive retinopathy    OU   Indigestion    Thrombocytopenia (HCC)    Wears dentures    full upper and lower    Current Outpatient Medications  Medication Sig Dispense Refill   apixaban (ELIQUIS) 5 MG TABS tablet Take 1 tablet (5 mg total) by mouth 2 (two) times daily. 60 tablet 1   aspirin EC 81 MG tablet Take 81 mg by mouth daily.     Continuous Blood Gluc Receiver (FREESTYLE LIBRE 14 DAY READER) DEVI Use 1 Device as directed Dx: E11.29     Continuous Blood Gluc Sensor (FREESTYLE LIBRE 14 DAY SENSOR) MISC SMARTSIG:1 Kit(s) Topical Every 2 Weeks     glipiZIDE (GLUCOTROL) 10 MG tablet Take 10 mg by mouth 2 (two) times daily.      HYDROcodone-acetaminophen (NORCO) 7.5-325 MG tablet Take 1  tablet by mouth every 6 (six) hours as needed for moderate pain.     insulin NPH Human (NOVOLIN N) 100 UNIT/ML injection Inject 5 units before morning mea. Inject 8 units before evening meal.     isosorbide mononitrate (IMDUR) 30 MG 24 hr tablet Take 30 mg by mouth daily.      meclizine (ANTIVERT) 25 MG tablet Take 1 tablet (25 mg total) by mouth 3 (three) times daily as needed for dizziness. 30 tablet 0   nitroGLYCERIN (NITROSTAT) 0.4 MG SL tablet Place 0.4 mg under the tongue every 5 (five) minutes as needed for chest pain.     torsemide (DEMADEX) 20 MG tablet Take 20 mg by mouth 2 (two) times daily.      carvedilol (COREG) 6.25 MG tablet Take 6.25 mg by mouth 2 (two) times daily with a meal. (Patient not taking: Reported on 05/28/2021)     No current facility-administered medications for this visit.    Physical Exam BP 120/64   Pulse 63   Resp 20   Ht $R'6\' 1"'ft$  (1.854 m)   Wt 178 lb (80.7 kg)   SpO2 95% Comment: RA  BMI 23.29 kg/m  75 year old man in no acute distress Alert and oriented x3 with no focal deficits  Diminished breath sounds left base Cardiac regular rate and rhythm 1+ edema lower extremities  Diagnostic Tests: I personally viewed the chest x-ray.  There is a moderate left pleural effusion.  Post operative changes from previous bypass surgery.  Impression: Rick Zuniga is a 75 year old man with numerous medical problems who has recurrent left pleural effusions.  Likely a combination of congestive heart failure and chronic kidney disease.  He has had multiple negative cytologies.  He usually can go a few weeks between thoracentesis.  I offered him the option of left pleural catheter placement for symptomatic management of his pleural effusions.  He is aware that this does not treat the underlying cause.  He understands that even if the pleural synthesis is achieved, he may start collecting fluid elsewhere.  He understands the general nature of the procedure.  He is  aware of the indications, risk, benefits, and alternatives.  He understands the risks include bleeding, pneumothorax, catheter malposition, catheter occlusion, and infection.  He is accompanied by his wife who had numerous questions.  She is very anxious about the procedure.  Plan: For now he wishes to hold off on oral catheter placement, he wants to think about it some more. If he does wish to proceed he can call the office and we can be scheduled. He will need to hold Eliquis for 48 hours prior to catheter placement  Melrose Nakayama, MD [facility] [phone]

## 2021-05-28 NOTE — Progress Notes (Signed)
  HPI:  Patient returns for routine postoperative follow-up having undergone  The patient's early postoperative recovery while in the hospital was notable for Since hospital discharge the patient reports   Current Outpatient Medications  Medication Sig Dispense Refill   apixaban (ELIQUIS) 5 MG TABS tablet Take 1 tablet (5 mg total) by mouth 2 (two) times daily. 60 tablet 1   aspirin EC 81 MG tablet Take 81 mg by mouth daily.     Continuous Blood Gluc Receiver (FREESTYLE LIBRE 14 DAY READER) DEVI Use 1 Device as directed Dx: E11.29     Continuous Blood Gluc Sensor (FREESTYLE LIBRE 14 DAY SENSOR) MISC SMARTSIG:1 Kit(s) Topical Every 2 Weeks     glipiZIDE (GLUCOTROL) 10 MG tablet Take 10 mg by mouth 2 (two) times daily.      HYDROcodone-acetaminophen (NORCO) 7.5-325 MG tablet Take 1 tablet by mouth every 6 (six) hours as needed for moderate pain.     insulin NPH Human (NOVOLIN N) 100 UNIT/ML injection Inject 5 units before morning mea. Inject 8 units before evening meal.     isosorbide mononitrate (IMDUR) 30 MG 24 hr tablet Take 30 mg by mouth daily.      meclizine (ANTIVERT) 25 MG tablet Take 1 tablet (25 mg total) by mouth 3 (three) times daily as needed for dizziness. 30 tablet 0   nitroGLYCERIN (NITROSTAT) 0.4 MG SL tablet Place 0.4 mg under the tongue every 5 (five) minutes as needed for chest pain.     torsemide (DEMADEX) 20 MG tablet Take 20 mg by mouth 2 (two) times daily.      carvedilol (COREG) 6.25 MG tablet Take 6.25 mg by mouth 2 (two) times daily with a meal. (Patient not taking: Reported on 05/28/2021)     No current facility-administered medications for this visit.    Physical Exam  Diagnostic Tests:   Impression:  Plan:   Melrose Nakayama, MD [facility] [phone]

## 2021-06-20 ENCOUNTER — Other Ambulatory Visit: Payer: Self-pay | Admitting: Pulmonary Disease

## 2021-06-20 ENCOUNTER — Other Ambulatory Visit: Payer: Self-pay | Admitting: Family Medicine

## 2021-06-20 DIAGNOSIS — J90 Pleural effusion, not elsewhere classified: Secondary | ICD-10-CM

## 2021-06-25 ENCOUNTER — Other Ambulatory Visit: Payer: Self-pay

## 2021-06-25 ENCOUNTER — Ambulatory Visit
Admission: RE | Admit: 2021-06-25 | Discharge: 2021-06-25 | Disposition: A | Discharge status: Home / Self Care | Payer: Medicare Other | Source: Ambulatory Visit | Attending: Radiology | Admitting: Radiology

## 2021-06-25 ENCOUNTER — Other Ambulatory Visit: Payer: Self-pay | Admitting: Radiology

## 2021-06-25 ENCOUNTER — Ambulatory Visit
Admission: RE | Admit: 2021-06-25 | Discharge: 2021-06-25 | Disposition: A | Discharge status: Home / Self Care | Payer: Medicare Other | Source: Ambulatory Visit | Attending: Pulmonary Disease | Admitting: Pulmonary Disease

## 2021-06-25 DIAGNOSIS — J90 Pleural effusion, not elsewhere classified: Secondary | ICD-10-CM | POA: Insufficient documentation

## 2021-06-25 DIAGNOSIS — E1122 Type 2 diabetes mellitus with diabetic chronic kidney disease: Secondary | ICD-10-CM | POA: Diagnosis not present

## 2021-06-25 DIAGNOSIS — E785 Hyperlipidemia, unspecified: Secondary | ICD-10-CM | POA: Diagnosis not present

## 2021-06-25 DIAGNOSIS — Z951 Presence of aortocoronary bypass graft: Secondary | ICD-10-CM | POA: Diagnosis not present

## 2021-06-25 DIAGNOSIS — I509 Heart failure, unspecified: Secondary | ICD-10-CM | POA: Insufficient documentation

## 2021-06-25 DIAGNOSIS — N189 Chronic kidney disease, unspecified: Secondary | ICD-10-CM | POA: Diagnosis not present

## 2021-06-25 DIAGNOSIS — I130 Hypertensive heart and chronic kidney disease with heart failure and stage 1 through stage 4 chronic kidney disease, or unspecified chronic kidney disease: Secondary | ICD-10-CM | POA: Insufficient documentation

## 2021-06-25 NOTE — Procedures (Signed)
The patient presented today for image guided left sided tunneled pleurX catheter placement with thoracentesis. This is a 75 year old patient with history of CABG x 4, HTN, HLP, DM, CHF, and CKD not requiring dialysis. The patient states he is getting left thoracentesis every 6 weeks with improvement in his symptoms. He lives by himself and does his ADLs independently. He also mentions he recently had his diuretics increased which helped his swelling and symptoms however ran out of the medication, he is scheduled to see his cardiologist tomorrow. His case and imaging was discussed with my attending, Dr. Maryelizabeth Kaufmann today and unfortunately a PleurX is not indicated at this time, we would recommend optimizing medical management and thoracentesis when needed. The risks and benefits and a pleurX catheter versus repeat thoracentesis procedures were discussed at length today with the patient and his family member. They both verbalized understanding of the plan and wish to proceed with thoracentesis when needed. The ordering provider, Dr. Lanney Gins was contacted regarding the above discussion as well.   PROCEDURE SUMMARY:  Successful US guided left thoracentesis. Yielded 1 liter of clear yellow fluid. Pt tolerated procedure well. No immediate complications.  Specimen was not sent for labs. CXR ordered.  EBL N/A  Rockney Ghee 06/25/2021 2:31 PM

## 2021-07-31 ENCOUNTER — Encounter: Payer: Self-pay | Admitting: Ophthalmology

## 2021-08-02 ENCOUNTER — Other Ambulatory Visit: Payer: Self-pay | Admitting: Pulmonary Disease

## 2021-08-02 DIAGNOSIS — J90 Pleural effusion, not elsewhere classified: Secondary | ICD-10-CM

## 2021-08-02 NOTE — Anesthesia Preprocedure Evaluation (Addendum)
Anesthesia Evaluation  Patient identified by MRN, date of birth, ID band Patient awake    Reviewed: Allergy & Precautions, H&P , NPO status , Patient's Chart, lab work & pertinent test results  Airway Mallampati: II  TM Distance: >3 FB Neck ROM: full    Dental  (+) Upper Dentures, Lower Dentures   Pulmonary shortness of breath, COPD,  L chronic pleural effusion, tx'ed with thoracentesis every 6 weeks.  Most recent 2 days ago.  Reduced BS L  Pulmonary exam normal        Cardiovascular hypertension, + CAD, + CABG and +CHF  Normal cardiovascular exam Rhythm:regular Rate:Normal     Neuro/Psych    GI/Hepatic   Endo/Other  diabetes  Renal/GU      Musculoskeletal  (+) Arthritis ,   Abdominal   Peds  Hematology   Anesthesia Other Findings   Reproductive/Obstetrics                            Anesthesia Physical Anesthesia Plan  ASA: 4  Anesthesia Plan: MAC   Post-op Pain Management: Minimal or no pain anticipated   Induction:   PONV Risk Score and Plan: 1 and Treatment may vary due to age or medical condition, Midazolam and TIVA  Airway Management Planned:   Additional Equipment:   Intra-op Plan:   Post-operative Plan:   Informed Consent: I have reviewed the patients History and Physical, chart, labs and discussed the procedure including the risks, benefits and alternatives for the proposed anesthesia with the patient or authorized representative who has indicated his/her understanding and acceptance.     Dental Advisory Given  Plan Discussed with: CRNA  Anesthesia Plan Comments:         Anesthesia Quick Evaluation

## 2021-08-05 ENCOUNTER — Ambulatory Visit
Admission: RE | Admit: 2021-08-05 | Discharge: 2021-08-05 | Disposition: A | Discharge status: Home / Self Care | Payer: Medicare Other | Source: Ambulatory Visit | Attending: Pulmonary Disease | Admitting: Pulmonary Disease

## 2021-08-05 ENCOUNTER — Ambulatory Visit
Admission: RE | Admit: 2021-08-05 | Discharge: 2021-08-05 | Disposition: A | Discharge status: Home / Self Care | Payer: Medicare Other | Source: Ambulatory Visit | Attending: Radiology | Admitting: Radiology

## 2021-08-05 DIAGNOSIS — J90 Pleural effusion, not elsewhere classified: Secondary | ICD-10-CM | POA: Diagnosis not present

## 2021-08-05 DIAGNOSIS — Z9889 Other specified postprocedural states: Secondary | ICD-10-CM | POA: Diagnosis not present

## 2021-08-05 LAB — PROTEIN, PLEURAL OR PERITONEAL FLUID: Total protein, fluid: <3 g/dL

## 2021-08-05 LAB — BODY FLUID CELL COUNT WITH DIFFERENTIAL
Eos, Fluid: 0 %
Lymphs, Fluid: 79 %
Monocyte-Macrophage-Serous Fluid: 17 %
Neutrophil Count, Fluid: 4 %
Total Nucleated Cell Count, Fluid: 116 cu mm

## 2021-08-05 LAB — AMYLASE, PLEURAL OR PERITONEAL FLUID: Amylase, Fluid: 21 U/L

## 2021-08-05 LAB — GLUCOSE, PLEURAL OR PERITONEAL FLUID: Glucose, Fluid: 184 mg/dL

## 2021-08-05 LAB — LACTATE DEHYDROGENASE, PLEURAL OR PERITONEAL FLUID: LD, Fluid: 54 U/L — ABNORMAL HIGH (ref 3–23)

## 2021-08-05 NOTE — Discharge Instructions (Signed)

## 2021-08-05 NOTE — Procedures (Signed)
PROCEDURE SUMMARY:  Successful US guided left thoracentesis. Yielded 1 liter of clear yellow fluid. Pt tolerated procedure well. No immediate complications.  Specimen was sent for labs. CXR ordered.  EBL None.  Tsosie Billing D PA-C 08/05/2021 2:48 PM

## 2021-08-07 ENCOUNTER — Ambulatory Visit: Payer: Medicare Other | Admitting: Anesthesiology

## 2021-08-07 ENCOUNTER — Encounter
Admission: RE | Disposition: A | Discharge status: Home / Self Care | Payer: Self-pay | Source: Home / Self Care | Attending: Ophthalmology

## 2021-08-07 ENCOUNTER — Ambulatory Visit
Admission: RE | Admit: 2021-08-07 | Discharge: 2021-08-07 | Disposition: A | Discharge status: Home / Self Care | Payer: Medicare Other | Attending: Ophthalmology | Admitting: Ophthalmology

## 2021-08-07 ENCOUNTER — Other Ambulatory Visit: Payer: Self-pay

## 2021-08-07 ENCOUNTER — Encounter: Payer: Self-pay | Admitting: Ophthalmology

## 2021-08-07 DIAGNOSIS — I130 Hypertensive heart and chronic kidney disease with heart failure and stage 1 through stage 4 chronic kidney disease, or unspecified chronic kidney disease: Secondary | ICD-10-CM | POA: Diagnosis not present

## 2021-08-07 DIAGNOSIS — E113293 Type 2 diabetes mellitus with mild nonproliferative diabetic retinopathy without macular edema, bilateral: Secondary | ICD-10-CM | POA: Insufficient documentation

## 2021-08-07 DIAGNOSIS — I509 Heart failure, unspecified: Secondary | ICD-10-CM | POA: Insufficient documentation

## 2021-08-07 DIAGNOSIS — E1122 Type 2 diabetes mellitus with diabetic chronic kidney disease: Secondary | ICD-10-CM | POA: Diagnosis not present

## 2021-08-07 DIAGNOSIS — E1136 Type 2 diabetes mellitus with diabetic cataract: Secondary | ICD-10-CM | POA: Diagnosis not present

## 2021-08-07 DIAGNOSIS — H2512 Age-related nuclear cataract, left eye: Secondary | ICD-10-CM | POA: Insufficient documentation

## 2021-08-07 DIAGNOSIS — Z7984 Long term (current) use of oral hypoglycemic drugs: Secondary | ICD-10-CM | POA: Insufficient documentation

## 2021-08-07 DIAGNOSIS — N189 Chronic kidney disease, unspecified: Secondary | ICD-10-CM | POA: Insufficient documentation

## 2021-08-07 HISTORY — PX: CATARACT EXTRACTION W/PHACO: SHX586

## 2021-08-07 HISTORY — DX: Pleural effusion, not elsewhere classified: J90

## 2021-08-07 LAB — ACID FAST SMEAR (AFB, MYCOBACTERIA): Acid Fast Smear: NEGATIVE

## 2021-08-07 LAB — CYTOLOGY - NON PAP

## 2021-08-07 LAB — PROTEIN, BODY FLUID (OTHER): Total Protein, Body Fluid Other: 2 g/dL

## 2021-08-07 LAB — GLUCOSE, CAPILLARY
Glucose-Capillary: 158 mg/dL — ABNORMAL HIGH (ref 70–99)
Glucose-Capillary: 170 mg/dL — ABNORMAL HIGH (ref 70–99)

## 2021-08-07 SURGERY — PHACOEMULSIFICATION, CATARACT, WITH IOL INSERTION
Anesthesia: Monitor Anesthesia Care | Site: Eye | Laterality: Left

## 2021-08-07 MED ORDER — TETRACAINE HCL 0.5 % OP SOLN
1.0000 [drp] | OPHTHALMIC | Status: DC | PRN
  Administered 2021-08-07 (×3): 1 [drp] via OPHTHALMIC

## 2021-08-07 MED ORDER — ACETAMINOPHEN 325 MG PO TABS: 325.0000 mg | ORAL_TABLET | Freq: Once | ORAL | Status: DC

## 2021-08-07 MED ORDER — SIGHTPATH DOSE#1 NA HYALUR & NA CHOND-NA HYALUR IO KIT
PACK | INTRAOCULAR | Status: DC | PRN
  Administered 2021-08-07: 1 via OPHTHALMIC

## 2021-08-07 MED ORDER — MIDAZOLAM HCL 2 MG/2ML IJ SOLN
INTRAMUSCULAR | Status: DC | PRN
Start: 2021-08-07 — End: 2021-08-07
  Administered 2021-08-07: .5 mg via INTRAVENOUS

## 2021-08-07 MED ORDER — SIGHTPATH DOSE#1 BSS IO SOLN
INTRAOCULAR | Status: DC | PRN
  Administered 2021-08-07: 15 mL via INTRAOCULAR

## 2021-08-07 MED ORDER — ACETAMINOPHEN 160 MG/5ML PO SOLN: 325.0000 mg | Freq: Once | ORAL | Status: DC

## 2021-08-07 MED ORDER — BRIMONIDINE TARTRATE-TIMOLOL 0.2-0.5 % OP SOLN
OPHTHALMIC | Status: DC | PRN
  Administered 2021-08-07: 1 [drp] via OPHTHALMIC

## 2021-08-07 MED ORDER — CEFUROXIME OPHTHALMIC INJECTION 1 MG/0.1 ML
INJECTION | OPHTHALMIC | Status: DC | PRN
  Administered 2021-08-07: 0.1 mL via INTRACAMERAL

## 2021-08-07 MED ORDER — ARMC OPHTHALMIC DILATING DROPS
1.0000 "application " | OPHTHALMIC | Status: DC | PRN
  Administered 2021-08-07 (×3): 1 via OPHTHALMIC

## 2021-08-07 MED ORDER — SIGHTPATH DOSE#1 BSS IO SOLN
INTRAOCULAR | Status: DC | PRN
  Administered 2021-08-07: 08:00:00 92 mL via OPHTHALMIC

## 2021-08-07 MED ORDER — LACTATED RINGERS IV SOLN: INTRAVENOUS | Status: DC

## 2021-08-07 MED ORDER — SIGHTPATH DOSE#1 BSS IO SOLN
INTRAOCULAR | Status: DC | PRN
  Administered 2021-08-07: 2 mL

## 2021-08-07 MED ORDER — FENTANYL CITRATE (PF) 100 MCG/2ML IJ SOLN
INTRAMUSCULAR | Status: DC | PRN
  Administered 2021-08-07: 50 ug via INTRAVENOUS

## 2021-08-07 SURGICAL SUPPLY — 11 items
CANNULA ANT/CHMB 27G (MISCELLANEOUS) IMPLANT
CANNULA ANT/CHMB 27GA (MISCELLANEOUS) IMPLANT
GLOVE SRG 8 PF TXTR STRL LF DI (GLOVE) ×1 IMPLANT
GLOVE SURG GAMMEX PI TX LF 7.5 (GLOVE) ×2 IMPLANT
GLOVE SURG UNDER POLY LF SZ8 (GLOVE) ×3
LENS IOL TECNIS EYHANCE 20.5 (Intraocular Lens) ×2 IMPLANT
NDL FILTER BLUNT 18X1 1/2 (NEEDLE) ×1 IMPLANT
NEEDLE FILTER BLUNT 18X 1/2SAF (NEEDLE) ×2
NEEDLE FILTER BLUNT 18X1 1/2 (NEEDLE) ×1 IMPLANT
SYR 3ML LL SCALE MARK (SYRINGE) ×3 IMPLANT
WATER STERILE IRR 250ML POUR (IV SOLUTION) ×3 IMPLANT

## 2021-08-07 NOTE — H&P (Signed)
[place]   Primary Care Physician:  [name], MD Ophthalmologist: Dr. Leandrew Koyanagi  Pre-Procedure History & Physical: HPI:  Rick Zuniga is a 75 y.o. male here for ophthalmic surgery.   Past Medical History:  Diagnosis Date   Arthritis    Cataract    Mixed OU   CHF (congestive heart failure) (HCC)    Chronic kidney disease    COPD (chronic obstructive pulmonary disease) (HCC)    Diabetes (HCC)    Diabetic retinopathy ([place])    NPDR OU   Heart disease    Hypertension    Hypertensive retinopathy    OU   Indigestion    Recurrent pleural effusion on left    Thrombocytopenia (HCC)    Wears dentures    full upper and lower    Past Surgical History:  Procedure Laterality Date   APPENDECTOMY     CARDIAC SURGERY  12/21/2009   bypass. 4 vessel   CHOLECYSTECTOMY     COLONOSCOPY  12/23/2010    Prior to Admission medications   Medication Sig Start Date End Date Taking? Authorizing Provider  apixaban (ELIQUIS) 5 MG TABS tablet Take 1 tablet (5 mg total) by mouth 2 (two) times daily. 02/16/20  Yes Nicole Kindred A, DO  aspirin EC 81 MG tablet Take 81 mg by mouth daily.   Yes [provider]  carvedilol (COREG) 6.25 MG tablet Take 6.25 mg by mouth 2 (two) times daily with a meal.   Yes [provider]  glipiZIDE (GLUCOTROL) 10 MG tablet Take 10 mg by mouth 2 (two) times daily. 5 mg in the morning   Yes [provider]  insulin NPH Human (NOVOLIN N) 100 UNIT/ML injection Inject 5 units before morning mea. Inject 8 units before evening meal. 11/23/20  Yes [provider]  isosorbide mononitrate (IMDUR) 30 MG 24 hr tablet Take 60 mg by mouth daily. 08/19/19  Yes [provider]  meclizine (ANTIVERT) 25 MG tablet Take 1 tablet (25 mg total) by mouth 3 (three) times daily as needed for dizziness. 03/02/20  Yes Sharen Hones, MD  nitroGLYCERIN (NITROSTAT) 0.4 MG SL tablet Place 0.4 mg under the tongue every 5 (five)  minutes as needed for chest pain.   Yes [provider]  torsemide (DEMADEX) 20 MG tablet Take 20 mg by mouth 2 (two) times daily.  12/05/19  Yes [provider]  Continuous Blood Gluc Receiver (FREESTYLE LIBRE 14 DAY READER) DEVI Use 1 Device as directed Dx: E11.29 02/22/20   [provider]  Continuous Blood Gluc Sensor (FREESTYLE LIBRE 14 DAY SENSOR) MISC SMARTSIG:1 Kit(s) Topical Every 2 Weeks 10/29/20   [provider]    Allergies as of 07/05/2021 - Review Complete 05/28/2021  Allergen Reaction Noted   Pioglitazone Swelling 02/14/2016   Furosemide Other (See Comments) 03/05/2020   Metformin and related Diarrhea 02/08/2020   Atorvastatin Rash 02/14/2016   Benazepril Rash 02/14/2016   Latex Rash 02/14/2016   Other Rash 02/14/2016   Tape Rash 02/14/2016    Family History  Problem Relation Age of Onset   CAD Mother    Kidney failure Father    Diabetes Father     Social History   Socioeconomic History   Marital status: Single    Spouse name: Not on file   Number of children: Not on file   Years of education: Not on file   Highest education level: Not on file  Occupational History   Not on  file  Tobacco Use   Smoking status: Never   Smokeless tobacco: Never  Vaping Use   Vaping Use: Never used  Substance and Sexual Activity   Alcohol use: No   Drug use: No   Sexual activity: Not on file  Other Topics Concern   Not on file  Social History Narrative   Not on file   Social Determinants of Health   Financial Resource Strain: Not on file  Food Insecurity: Not on file  Transportation Needs: Not on file  Physical Activity: Not on file  Stress: Not on file  Social Connections: Not on file  Intimate Partner Violence: Not on file    Review of Systems: See HPI, otherwise negative ROS  Physical Exam: BP 140/80    Pulse 74    Temp (!) 97.3 F (36.3 C) (Temporal)    Resp 16    Ht $R'6\' 1"'Hw$  (1.854 m)    Wt 77.6 kg    SpO2 98%    BMI 22.56  kg/m  General:   Alert,  pleasant and cooperative in NAD Head:  Normocephalic and atraumatic. Lungs:  Clear to auscultation.    Heart:  Regular rate and rhythm.   Impression/Plan: Rick Zuniga is here for ophthalmic surgery.  Risks, benefits, limitations, and alternatives regarding ophthalmic surgery have been reviewed with the patient.  Questions have been answered.  All parties agreeable.   Leandrew Koyanagi, MD  08/07/2021, 7:36 AM

## 2021-08-07 NOTE — Anesthesia Postprocedure Evaluation (Signed)
Anesthesia Post Note  Patient: Rick Zuniga  Procedure(s) Performed: CATARACT EXTRACTION PHACO AND INTRAOCULAR LENS PLACEMENT (IOC) LEFT DIABETIC 9.06 01:42.2 (Left: Eye)     Patient location during evaluation: PACU Anesthesia Type: MAC Level of consciousness: awake and alert and oriented Pain management: satisfactory to patient Vital Signs Assessment: post-procedure vital signs reviewed and stable Respiratory status: spontaneous breathing, nonlabored ventilation and respiratory function stable Cardiovascular status: blood pressure returned to baseline and stable Postop Assessment: Adequate PO intake and No signs of nausea or vomiting Anesthetic complications: no   No notable events documented.  Raliegh Ip

## 2021-08-07 NOTE — Op Note (Signed)
°  OPERATIVE NOTE  NAIM MURTHA [id number] 08/07/2021   PREOPERATIVE DIAGNOSIS:  Nuclear sclerotic cataract left eye. H25.12   POSTOPERATIVE DIAGNOSIS:    Nuclear sclerotic cataract left eye.     PROCEDURE:  Phacoemusification with posterior chamber intraocular lens placement of the left eye  Ultrasound time: Procedure(s) with comments: CATARACT EXTRACTION PHACO AND INTRAOCULAR LENS PLACEMENT (IOC) LEFT DIABETIC 9.06 01:42.2 (Left) - Diabetic  LENS:   Implant Name Type Inv. Item Serial No. Manufacturer Lot No. LRB No. Used Action  LENS IOL TECNIS EYHANCE 20.5 - [id number] Intraocular Lens LENS IOL TECNIS EYHANCE 20.5 [id number] JOHNSON   Left 1 Implanted      SURGEON:  Wyonia Hough, MD   ANESTHESIA:  Topical with tetracaine drops and 2% Xylocaine jelly, augmented with 1% preservative-free intracameral lidocaine.    COMPLICATIONS:  None.   DESCRIPTION OF PROCEDURE:  The patient was identified in the holding room and transported to the operating room and placed in the supine position under the operating microscope.  The left eye was identified as the operative eye and it was prepped and draped in the usual sterile ophthalmic fashion.   A 1 millimeter clear-corneal paracentesis was made at the 1:30 position.  0.5 ml of preservative-free 1% lidocaine was injected into the anterior chamber.  The anterior chamber was filled with Viscoat viscoelastic.  A 2.4 millimeter keratome was used to make a near-clear corneal incision at the 10:30 position.  .  A curvilinear capsulorrhexis was made with a cystotome and capsulorrhexis forceps.  Balanced salt solution was used to hydrodissect and hydrodelineate the nucleus.   Phacoemulsification was then used in stop and chop fashion to remove the lens nucleus and epinucleus.  The remaining cortex was then removed using the irrigation and aspiration handpiece. Provisc was then placed into the capsular bag to distend it for lens placement.   A lens was then injected into the capsular bag.  The remaining viscoelastic was aspirated.   Wounds were hydrated with balanced salt solution.  The anterior chamber was inflated to a physiologic pressure with balanced salt solution.  No wound leaks were noted. Cefuroxime 0.1 ml of a 10mg /ml solution was injected into the anterior chamber for a dose of 1 mg of intracameral antibiotic at the completion of the case.   Timolol and Brimonidine drops were applied to the eye.  The patient was taken to the recovery room in stable condition without complications of anesthesia or surgery.  Charlise Giovanetti 08/07/2021, 8:07 AM

## 2021-08-07 NOTE — Transfer of Care (Signed)
Immediate Anesthesia Transfer of Care Note  Patient: Rick Zuniga  Procedure(s) Performed: CATARACT EXTRACTION PHACO AND INTRAOCULAR LENS PLACEMENT (IOC) LEFT DIABETIC 9.06 01:42.2 (Left: Eye)  Patient Location: PACU  Anesthesia Type: MAC  Level of Consciousness: awake, alert  and patient cooperative  Airway and Oxygen Therapy: Patient Spontanous Breathing and Patient connected to supplemental oxygen  Post-op Assessment: Post-op Vital signs reviewed, Patient's Cardiovascular Status Stable, Respiratory Function Stable, Patent Airway and No signs of Nausea or vomiting  Post-op Vital Signs: Reviewed and stable  Complications: No notable events documented.

## 2021-08-08 ENCOUNTER — Encounter: Payer: Self-pay | Admitting: Ophthalmology

## 2021-08-08 LAB — CHOLESTEROL, BODY FLUID: Cholesterol, Fluid: 14 mg/dL

## 2021-08-09 LAB — BODY FLUID CULTURE W GRAM STAIN
Culture: NO GROWTH
Gram Stain: NONE SEEN

## 2021-08-21 LAB — MISC LABCORP TEST (SEND OUT): Labcorp test code: 9985

## 2021-08-28 ENCOUNTER — Other Ambulatory Visit: Payer: Self-pay | Admitting: Pulmonary Disease

## 2021-08-28 DIAGNOSIS — J90 Pleural effusion, not elsewhere classified: Secondary | ICD-10-CM

## 2021-08-29 ENCOUNTER — Other Ambulatory Visit: Payer: Self-pay

## 2021-08-29 ENCOUNTER — Ambulatory Visit
Admission: RE | Admit: 2021-08-29 | Discharge: 2021-08-29 | Disposition: A | Discharge status: Home / Self Care | Payer: Medicare Other | Source: Ambulatory Visit | Attending: Student | Admitting: Student

## 2021-08-29 ENCOUNTER — Other Ambulatory Visit: Payer: Self-pay | Admitting: Student

## 2021-08-29 ENCOUNTER — Ambulatory Visit
Admission: RE | Admit: 2021-08-29 | Discharge: 2021-08-29 | Disposition: A | Discharge status: Home / Self Care | Payer: Medicare Other | Source: Ambulatory Visit | Attending: Pulmonary Disease | Admitting: Pulmonary Disease

## 2021-08-29 DIAGNOSIS — Z9889 Other specified postprocedural states: Secondary | ICD-10-CM

## 2021-08-29 DIAGNOSIS — J90 Pleural effusion, not elsewhere classified: Secondary | ICD-10-CM

## 2021-08-29 LAB — GLUCOSE, PLEURAL OR PERITONEAL FLUID: Glucose, Fluid: 135 mg/dL

## 2021-08-29 LAB — PROTEIN, PLEURAL OR PERITONEAL FLUID: Total protein, fluid: <3 g/dL

## 2021-08-29 LAB — AMYLASE, PLEURAL OR PERITONEAL FLUID: Amylase, Fluid: 21 U/L

## 2021-08-29 LAB — BODY FLUID CELL COUNT WITH DIFFERENTIAL
Eos, Fluid: 0 %
Lymphs, Fluid: 73 %
Monocyte-Macrophage-Serous Fluid: 21 %
Neutrophil Count, Fluid: 6 %
Total Nucleated Cell Count, Fluid: 319 cu mm

## 2021-08-29 LAB — LACTATE DEHYDROGENASE, PLEURAL OR PERITONEAL FLUID: LD, Fluid: 53 U/L — ABNORMAL HIGH (ref 3–23)

## 2021-08-29 NOTE — Procedures (Signed)
PROCEDURE SUMMARY:  Successful US guided left thoracentesis. Yielded 1.2 L of clear yellow fluid. Pt tolerated procedure well. No immediate complications.  Specimen sent for labs. CXR ordered; no post-procedure pneumothorax identified.   EBL < 2 mL  Theresa Duty, NP 08/29/2021 3:42 PM

## 2021-08-30 LAB — PROTEIN, BODY FLUID (OTHER): Total Protein, Body Fluid Other: 1.9 g/dL

## 2021-08-31 LAB — ACID FAST SMEAR (AFB, MYCOBACTERIA): Acid Fast Smear: NEGATIVE

## 2021-09-01 LAB — CHOLESTEROL, BODY FLUID: Cholesterol, Fluid: 15 mg/dL

## 2021-09-02 LAB — BODY FLUID CULTURE W GRAM STAIN: Culture: NO GROWTH

## 2021-09-02 LAB — CYTOLOGY - NON PAP

## 2021-09-03 LAB — FUNGUS CULTURE WITH STAIN

## 2021-09-03 LAB — FUNGUS CULTURE RESULT

## 2021-09-03 LAB — FUNGAL ORGANISM REFLEX

## 2021-09-19 LAB — ACID FAST CULTURE WITH REFLEXED SENSITIVITIES (MYCOBACTERIA): Acid Fast Culture: NEGATIVE

## 2021-09-27 LAB — FUNGUS CULTURE WITH STAIN

## 2021-09-27 LAB — FUNGUS CULTURE RESULT

## 2021-09-27 LAB — FUNGAL ORGANISM REFLEX

## 2021-10-14 LAB — ACID FAST CULTURE WITH REFLEXED SENSITIVITIES (MYCOBACTERIA): Acid Fast Culture: NEGATIVE

## 2021-10-14 LAB — MISC LABCORP TEST (SEND OUT): Labcorp test code: 5367

## 2021-10-15 ENCOUNTER — Other Ambulatory Visit: Payer: Self-pay | Admitting: Pulmonary Disease

## 2021-10-15 DIAGNOSIS — R601 Generalized edema: Secondary | ICD-10-CM

## 2021-10-17 ENCOUNTER — Other Ambulatory Visit: Payer: Self-pay

## 2021-10-17 ENCOUNTER — Ambulatory Visit
Admission: RE | Admit: 2021-10-17 | Discharge: 2021-10-17 | Disposition: A | Discharge status: Home / Self Care | Payer: Medicare Other | Source: Ambulatory Visit | Attending: Pulmonary Disease | Admitting: Pulmonary Disease

## 2021-10-17 ENCOUNTER — Other Ambulatory Visit: Payer: Self-pay | Admitting: Physician Assistant

## 2021-10-17 ENCOUNTER — Ambulatory Visit
Admission: RE | Admit: 2021-10-17 | Discharge: 2021-10-17 | Disposition: A | Discharge status: Home / Self Care | Payer: Medicare Other | Source: Ambulatory Visit | Attending: Physician Assistant | Admitting: Physician Assistant

## 2021-10-17 DIAGNOSIS — J90 Pleural effusion, not elsewhere classified: Secondary | ICD-10-CM

## 2021-10-17 DIAGNOSIS — N189 Chronic kidney disease, unspecified: Secondary | ICD-10-CM | POA: Insufficient documentation

## 2021-10-17 DIAGNOSIS — Z9889 Other specified postprocedural states: Secondary | ICD-10-CM | POA: Insufficient documentation

## 2021-10-17 DIAGNOSIS — R601 Generalized edema: Secondary | ICD-10-CM

## 2021-10-17 DIAGNOSIS — J984 Other disorders of lung: Secondary | ICD-10-CM | POA: Insufficient documentation

## 2021-10-17 DIAGNOSIS — I509 Heart failure, unspecified: Secondary | ICD-10-CM | POA: Insufficient documentation

## 2021-10-17 NOTE — Procedures (Signed)
PROCEDURE SUMMARY: ? ?Successful image-guided left thoracentesis. ?Yielded 1.0 liters of clear yellow fluid. ?Patient tolerated procedure well. ?EBL < 1 mL ?No immediate complications. ? ?Specimen was not sent for labs. ?Post procedure CXR shows no pneumothorax. ? ?Please see imaging section of Epic for full dictation. ? ?Joaquim Nam PA-C ?10/17/2021 ?2:04 PM ? ? ? ?

## 2021-10-19 IMAGING — DX DG CHEST 1V PORT
1 series · 1 of 1 positions shown · non-contrast
Comparison: July 27, 2020

CLINICAL DATA: Status post thoracentesis

EXAM:
PORTABLE CHEST 1 VIEW

[chest ap]
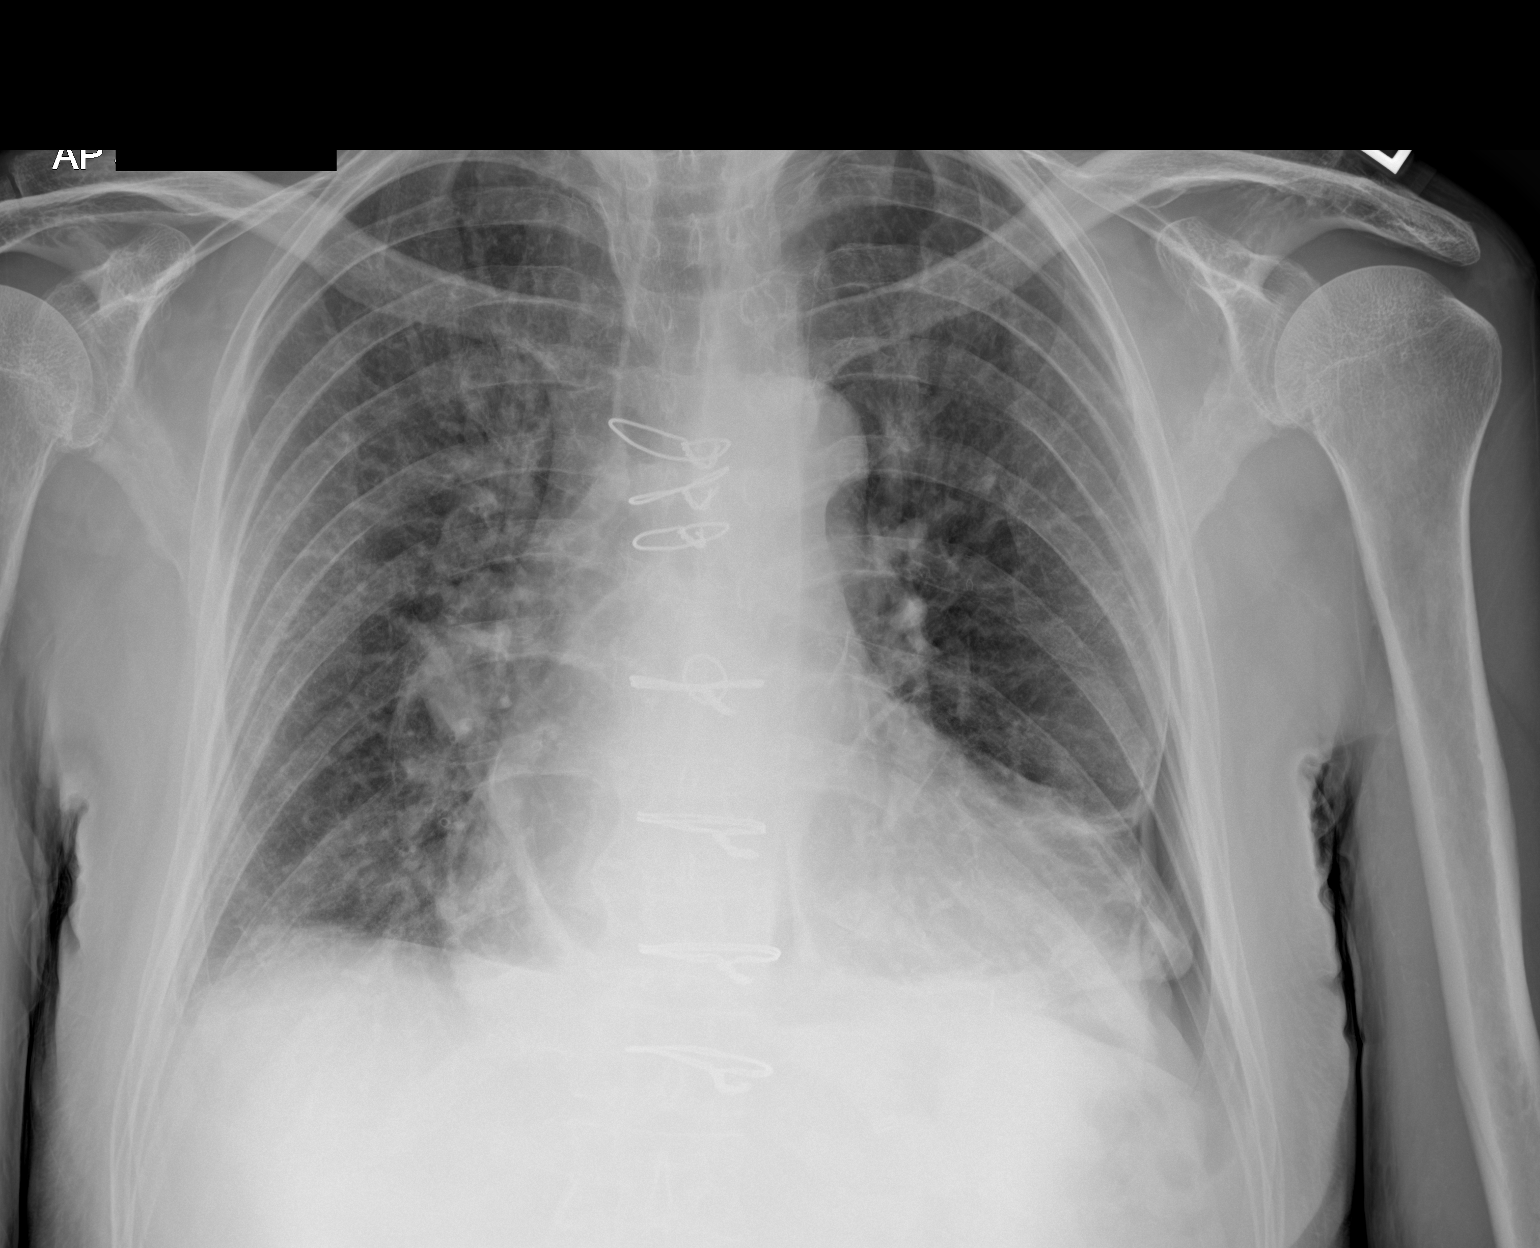

[1 of 1 positions shown; findings below may reference images not displayed]

FINDINGS: There is a small inferolateral pneumothorax on the left without
tension component. Small amount of residual left pleural effusion
noted with left base atelectasis. There is mild atelectasis in the
right base. Lungs elsewhere clear. Heart is enlarged, stable, with
pulmonary vascularity normal. Patient is status post coronary artery
bypass grafting. No adenopathy. No bone lesions
IMPRESSION: Small inferolateral left pneumothorax that tension component. Small
residual left pleural effusion with left base atelectasis. Medial
right base atelectasis. Lungs elsewhere clear. Stable cardiomegaly.

Critical Value/emergent results were called by telephone at the time
of interpretation on 08/09/2020 at [DATE] to provider POLIN
REITANO , who verbally acknowledged these results.

## 2022-01-21 ENCOUNTER — Other Ambulatory Visit: Payer: Self-pay | Admitting: Pulmonary Disease

## 2022-01-21 DIAGNOSIS — J90 Pleural effusion, not elsewhere classified: Secondary | ICD-10-CM

## 2022-01-22 ENCOUNTER — Ambulatory Visit
Admission: RE | Admit: 2022-01-22 | Discharge: 2022-01-22 | Disposition: A | Discharge status: Home / Self Care | Payer: Medicare Other | Source: Ambulatory Visit | Attending: Pulmonary Disease | Admitting: Pulmonary Disease

## 2022-01-22 ENCOUNTER — Other Ambulatory Visit: Payer: Self-pay | Admitting: Radiology

## 2022-01-22 ENCOUNTER — Ambulatory Visit
Admission: RE | Admit: 2022-01-22 | Discharge: 2022-01-22 | Disposition: A | Discharge status: Home / Self Care | Payer: Medicare Other | Source: Ambulatory Visit | Attending: Radiology | Admitting: Radiology

## 2022-01-22 DIAGNOSIS — Z9889 Other specified postprocedural states: Secondary | ICD-10-CM

## 2022-01-22 DIAGNOSIS — E1122 Type 2 diabetes mellitus with diabetic chronic kidney disease: Secondary | ICD-10-CM | POA: Diagnosis not present

## 2022-01-22 DIAGNOSIS — J90 Pleural effusion, not elsewhere classified: Secondary | ICD-10-CM | POA: Insufficient documentation

## 2022-01-22 DIAGNOSIS — E785 Hyperlipidemia, unspecified: Secondary | ICD-10-CM | POA: Diagnosis not present

## 2022-01-22 DIAGNOSIS — I129 Hypertensive chronic kidney disease with stage 1 through stage 4 chronic kidney disease, or unspecified chronic kidney disease: Secondary | ICD-10-CM | POA: Insufficient documentation

## 2022-01-22 DIAGNOSIS — N189 Chronic kidney disease, unspecified: Secondary | ICD-10-CM | POA: Insufficient documentation

## 2022-01-22 LAB — PROTEIN, PLEURAL OR PERITONEAL FLUID: Total protein, fluid: <3 g/dL

## 2022-01-22 LAB — LACTATE DEHYDROGENASE, PLEURAL OR PERITONEAL FLUID: LD, Fluid: 56 U/L — ABNORMAL HIGH (ref 3–23)

## 2022-01-22 LAB — BODY FLUID CELL COUNT WITH DIFFERENTIAL
Eos, Fluid: 0 %
Lymphs, Fluid: 77 %
Monocyte-Macrophage-Serous Fluid: 21 %
Neutrophil Count, Fluid: 2 %
Total Nucleated Cell Count, Fluid: 166 cu mm

## 2022-01-22 LAB — AMYLASE, PLEURAL OR PERITONEAL FLUID: Amylase, Fluid: 17 U/L

## 2022-01-22 LAB — GLUCOSE, PLEURAL OR PERITONEAL FLUID: Glucose, Fluid: 103 mg/dL

## 2022-01-22 NOTE — Procedures (Signed)
PROCEDURE SUMMARY:  Successful US guided left thoracentesis. Yielded 800 mL of clear yellow fluid. Pt tolerated procedure well. No immediate complications.  Specimen was sent for labs. CXR ordered.  EBL < 5 mL  Tsosie Billing D PA-C 01/22/2022 10:16 AM

## 2022-01-23 LAB — ACID FAST SMEAR (AFB, MYCOBACTERIA): Acid Fast Smear: NEGATIVE

## 2022-01-23 LAB — CYTOLOGY - NON PAP

## 2022-01-23 LAB — PROTEIN, BODY FLUID (OTHER): Total Protein, Body Fluid Other: 1.6 g/dL

## 2022-01-24 LAB — CHOLESTEROL, BODY FLUID: Cholesterol, Fluid: 11 mg/dL

## 2022-01-25 LAB — BODY FLUID CULTURE W GRAM STAIN
Culture: NO GROWTH
Gram Stain: NONE SEEN

## 2022-02-10 LAB — MISC LABCORP TEST (SEND OUT): Labcorp test code: 5397

## 2022-02-13 ENCOUNTER — Other Ambulatory Visit
Admission: RE | Admit: 2022-02-13 | Discharge: 2022-02-13 | Disposition: A | Discharge status: Home / Self Care | Payer: Medicare Other | Source: Ambulatory Visit | Attending: Internal Medicine | Admitting: Internal Medicine

## 2022-02-13 DIAGNOSIS — J9611 Chronic respiratory failure with hypoxia: Secondary | ICD-10-CM | POA: Diagnosis present

## 2022-02-13 DIAGNOSIS — N189 Chronic kidney disease, unspecified: Secondary | ICD-10-CM | POA: Insufficient documentation

## 2022-02-13 DIAGNOSIS — I130 Hypertensive heart and chronic kidney disease with heart failure and stage 1 through stage 4 chronic kidney disease, or unspecified chronic kidney disease: Secondary | ICD-10-CM | POA: Diagnosis not present

## 2022-02-13 LAB — TROPONIN I (HIGH SENSITIVITY): Troponin I (High Sensitivity): 55 ng/L — ABNORMAL HIGH (ref <18)

## 2022-02-13 LAB — BRAIN NATRIURETIC PEPTIDE: B Natriuretic Peptide: 2067.2 pg/mL — ABNORMAL HIGH (ref 0.0–100.0)

## 2022-02-14 ENCOUNTER — Other Ambulatory Visit: Payer: Self-pay | Admitting: Pulmonary Disease

## 2022-02-14 DIAGNOSIS — J9611 Chronic respiratory failure with hypoxia: Secondary | ICD-10-CM

## 2022-02-21 ENCOUNTER — Other Ambulatory Visit: Payer: Medicare Other

## 2022-02-24 LAB — FUNGUS CULTURE WITH STAIN

## 2022-02-24 LAB — FUNGAL ORGANISM REFLEX

## 2022-02-24 LAB — FUNGUS CULTURE RESULT

## 2022-03-03 ENCOUNTER — Ambulatory Visit
Admission: RE | Admit: 2022-03-03 | Discharge: 2022-03-03 | Disposition: A | Discharge status: Home / Self Care | Payer: Medicare Other | Source: Ambulatory Visit | Attending: Pulmonary Disease | Admitting: Pulmonary Disease

## 2022-03-03 DIAGNOSIS — J9611 Chronic respiratory failure with hypoxia: Secondary | ICD-10-CM

## 2022-03-24 ENCOUNTER — Other Ambulatory Visit: Payer: Self-pay | Admitting: Pulmonary Disease

## 2022-03-24 DIAGNOSIS — J90 Pleural effusion, not elsewhere classified: Secondary | ICD-10-CM

## 2022-03-25 ENCOUNTER — Ambulatory Visit
Admission: RE | Admit: 2022-03-25 | Discharge: 2022-03-25 | Disposition: A | Discharge status: Home / Self Care | Payer: Medicare Other | Source: Ambulatory Visit | Attending: Internal Medicine | Admitting: Internal Medicine

## 2022-03-25 ENCOUNTER — Other Ambulatory Visit: Payer: Self-pay | Admitting: Internal Medicine

## 2022-03-25 ENCOUNTER — Ambulatory Visit
Admission: RE | Admit: 2022-03-25 | Discharge: 2022-03-25 | Disposition: A | Discharge status: Home / Self Care | Payer: Medicare Other | Source: Ambulatory Visit | Attending: Pulmonary Disease | Admitting: Pulmonary Disease

## 2022-03-25 DIAGNOSIS — Z9889 Other specified postprocedural states: Secondary | ICD-10-CM

## 2022-03-25 DIAGNOSIS — J90 Pleural effusion, not elsewhere classified: Secondary | ICD-10-CM | POA: Diagnosis not present

## 2022-03-25 LAB — BODY FLUID CELL COUNT WITH DIFFERENTIAL
Eos, Fluid: 0 %
Lymphs, Fluid: 67 %
Monocyte-Macrophage-Serous Fluid: 30 % — ABNORMAL LOW (ref 50–90)
Neutrophil Count, Fluid: 3 % (ref 0–25)
Total Nucleated Cell Count, Fluid: 213 cu mm (ref 0–1000)

## 2022-03-25 LAB — GLUCOSE, PLEURAL OR PERITONEAL FLUID: Glucose, Fluid: 107 mg/dL

## 2022-03-25 LAB — LACTATE DEHYDROGENASE, PLEURAL OR PERITONEAL FLUID: LD, Fluid: 46 U/L — ABNORMAL HIGH (ref 3–23)

## 2022-03-25 LAB — AMYLASE, PLEURAL OR PERITONEAL FLUID: Amylase, Fluid: 15 U/L

## 2022-03-25 NOTE — Progress Notes (Signed)
Pt and wife asked about ways to control discomfort post thoracentesis. Pt states that the pain worsens with each procedure. Pt and wife were advised that pt may need to decrease time between thoracentesis to reduce the amount of fluid removed. We discussed trying to take frequent deep breaths to increase lung volume as well as positioning.  They were also advised that if there is a change in symptoms such as increasing shortness of breath, chest pain or other acute symptoms, he needs to be seen immediately.  Pt and wife verbalized understanding and are in agreement with recommendations.  Pt was transported to vehicle via wheelchair by [place] tech. He is in no distress.     Narda Rutherford, AGNP-BC 03/25/2022, 4:26 PM

## 2022-03-25 NOTE — Procedures (Signed)
PROCEDURE SUMMARY:  Successful US guided diagnostic and therapeutic left thoracentesis. Yielded 600 cc of clear, yellow fluid. Pt tolerated procedure well. No immediate complications.  Specimen was sent for labs. CXR ordered.  EBL < 1 mL  Tyson Alias, AGNP 03/25/2022 10:57 AM

## 2022-03-26 LAB — ACID FAST CULTURE WITH REFLEXED SENSITIVITIES (MYCOBACTERIA): Acid Fast Culture: NEGATIVE

## 2022-03-26 LAB — ACID FAST SMEAR (AFB, MYCOBACTERIA): Acid Fast Smear: NEGATIVE

## 2022-03-26 LAB — CYTOLOGY - NON PAP

## 2022-03-28 LAB — CHOLESTEROL, BODY FLUID: Cholesterol, Fluid: 9 mg/dL

## 2022-04-01 LAB — MISC LABCORP TEST (SEND OUT): Labcorp test code: 9985

## 2022-04-16 HISTORY — PX: CARDIAC CATHETERIZATION: SHX172

## 2022-04-25 LAB — FUNGUS CULTURE WITH STAIN

## 2022-04-25 LAB — FUNGUS CULTURE RESULT

## 2022-04-25 LAB — FUNGAL ORGANISM REFLEX

## 2022-05-08 LAB — ACID FAST CULTURE WITH REFLEXED SENSITIVITIES (MYCOBACTERIA): Acid Fast Culture: NEGATIVE

## 2022-05-20 HISTORY — PX: BI-VENTRICULAR IMPLANTABLE CARDIOVERTER DEFIBRILLATOR  (CRT-D): SHX5747

## 2022-05-23 DIAGNOSIS — U071 COVID-19: Secondary | ICD-10-CM

## 2022-05-23 HISTORY — DX: COVID-19: U07.1

## 2022-06-26 ENCOUNTER — Encounter: Payer: Self-pay | Admitting: Ophthalmology

## 2022-07-01 NOTE — Discharge Instructions (Signed)

## 2022-07-02 ENCOUNTER — Ambulatory Visit: Payer: Medicare Other | Admitting: Anesthesiology

## 2022-07-02 ENCOUNTER — Encounter
Admission: RE | Disposition: A | Discharge status: Home / Self Care | Payer: Self-pay | Source: Home / Self Care | Attending: Ophthalmology

## 2022-07-02 ENCOUNTER — Encounter: Payer: Self-pay | Admitting: Ophthalmology

## 2022-07-02 ENCOUNTER — Ambulatory Visit
Admission: RE | Admit: 2022-07-02 | Discharge: 2022-07-02 | Disposition: A | Discharge status: Home / Self Care | Payer: Medicare Other | Attending: Ophthalmology | Admitting: Ophthalmology

## 2022-07-02 ENCOUNTER — Other Ambulatory Visit: Payer: Self-pay

## 2022-07-02 DIAGNOSIS — Z7984 Long term (current) use of oral hypoglycemic drugs: Secondary | ICD-10-CM | POA: Insufficient documentation

## 2022-07-02 DIAGNOSIS — I2510 Atherosclerotic heart disease of native coronary artery without angina pectoris: Secondary | ICD-10-CM | POA: Insufficient documentation

## 2022-07-02 DIAGNOSIS — H2589 Other age-related cataract: Secondary | ICD-10-CM | POA: Diagnosis not present

## 2022-07-02 DIAGNOSIS — I252 Old myocardial infarction: Secondary | ICD-10-CM | POA: Insufficient documentation

## 2022-07-02 DIAGNOSIS — J449 Chronic obstructive pulmonary disease, unspecified: Secondary | ICD-10-CM | POA: Diagnosis not present

## 2022-07-02 DIAGNOSIS — Z79899 Other long term (current) drug therapy: Secondary | ICD-10-CM | POA: Insufficient documentation

## 2022-07-02 DIAGNOSIS — E1136 Type 2 diabetes mellitus with diabetic cataract: Secondary | ICD-10-CM | POA: Insufficient documentation

## 2022-07-02 DIAGNOSIS — I130 Hypertensive heart and chronic kidney disease with heart failure and stage 1 through stage 4 chronic kidney disease, or unspecified chronic kidney disease: Secondary | ICD-10-CM | POA: Diagnosis not present

## 2022-07-02 DIAGNOSIS — N189 Chronic kidney disease, unspecified: Secondary | ICD-10-CM | POA: Insufficient documentation

## 2022-07-02 DIAGNOSIS — Z95810 Presence of automatic (implantable) cardiac defibrillator: Secondary | ICD-10-CM | POA: Insufficient documentation

## 2022-07-02 DIAGNOSIS — E1122 Type 2 diabetes mellitus with diabetic chronic kidney disease: Secondary | ICD-10-CM | POA: Diagnosis not present

## 2022-07-02 DIAGNOSIS — Z794 Long term (current) use of insulin: Secondary | ICD-10-CM | POA: Diagnosis not present

## 2022-07-02 DIAGNOSIS — E11319 Type 2 diabetes mellitus with unspecified diabetic retinopathy without macular edema: Secondary | ICD-10-CM | POA: Diagnosis not present

## 2022-07-02 DIAGNOSIS — I509 Heart failure, unspecified: Secondary | ICD-10-CM | POA: Diagnosis not present

## 2022-07-02 DIAGNOSIS — Z951 Presence of aortocoronary bypass graft: Secondary | ICD-10-CM | POA: Diagnosis not present

## 2022-07-02 HISTORY — PX: ANTERIOR VITRECTOMY: SHX1173

## 2022-07-02 HISTORY — DX: Presence of cardiac pacemaker: Z95.0

## 2022-07-02 HISTORY — DX: Presence of automatic (implantable) cardiac defibrillator: Z95.810

## 2022-07-02 HISTORY — PX: CATARACT EXTRACTION W/PHACO: SHX586

## 2022-07-02 SURGERY — PHACOEMULSIFICATION, CATARACT, WITH IOL INSERTION
Anesthesia: Monitor Anesthesia Care | Site: Eye | Laterality: Right

## 2022-07-02 MED ORDER — NA CHONDROIT SULF-NA HYALURON 40-30 MG/ML IO SOSY
INTRAOCULAR | Status: DC | PRN
  Administered 2022-07-02: .5 mL via INTRAOCULAR

## 2022-07-02 MED ORDER — CEFUROXIME OPHTHALMIC INJECTION 1 MG/0.1 ML
INJECTION | OPHTHALMIC | Status: DC | PRN
  Administered 2022-07-02: .1 mL via INTRACAMERAL

## 2022-07-02 MED ORDER — BRIMONIDINE TARTRATE-TIMOLOL 0.2-0.5 % OP SOLN
OPHTHALMIC | Status: DC | PRN
  Administered 2022-07-02: 1 [drp] via OPHTHALMIC

## 2022-07-02 MED ORDER — SIGHTPATH DOSE#1 BSS IO SOLN
INTRAOCULAR | Status: DC | PRN
  Administered 2022-07-02: 1 mL via INTRAMUSCULAR

## 2022-07-02 MED ORDER — TRYPAN BLUE 0.06 % IO SOSY
PREFILLED_SYRINGE | INTRAOCULAR | Status: DC | PRN
  Administered 2022-07-02: .2 mL via INTRAOCULAR

## 2022-07-02 MED ORDER — SODIUM HYALURONATE 23MG/ML IO SOSY
PREFILLED_SYRINGE | INTRAOCULAR | Status: DC | PRN
  Administered 2022-07-02: .6 mL via INTRAOCULAR

## 2022-07-02 MED ORDER — NEOMYCIN-POLYMYXIN-DEXAMETH 3.5-10000-0.1 OP OINT
TOPICAL_OINTMENT | OPHTHALMIC | Status: DC | PRN
  Administered 2022-07-02: 1 via OPHTHALMIC

## 2022-07-02 MED ORDER — MIDAZOLAM HCL 2 MG/2ML IJ SOLN
INTRAMUSCULAR | Status: DC | PRN
  Administered 2022-07-02: .5 mg via INTRAVENOUS

## 2022-07-02 MED ORDER — TETRACAINE HCL 0.5 % OP SOLN
1.0000 [drp] | OPHTHALMIC | Status: DC | PRN
  Administered 2022-07-02 (×3): 1 [drp] via OPHTHALMIC

## 2022-07-02 MED ORDER — SIGHTPATH DOSE#1 BSS IO SOLN
INTRAOCULAR | Status: DC | PRN
  Administered 2022-07-02 (×2): 15 mL

## 2022-07-02 MED ORDER — ARMC OPHTHALMIC DILATING DROPS
1.0000 | OPHTHALMIC | Status: DC | PRN
  Administered 2022-07-02 (×3): 1 via OPHTHALMIC

## 2022-07-02 MED ORDER — FENTANYL CITRATE (PF) 100 MCG/2ML IJ SOLN
INTRAMUSCULAR | Status: DC | PRN
  Administered 2022-07-02: 75 ug via INTRAVENOUS

## 2022-07-02 MED ORDER — SIGHTPATH DOSE#1 BSS IO SOLN
INTRAOCULAR | Status: DC | PRN
  Administered 2022-07-02: 80 mL via OPHTHALMIC

## 2022-07-02 MED ORDER — LIDOCAINE HCL (CARDIAC) PF 100 MG/5ML IV SOSY
PREFILLED_SYRINGE | INTRAVENOUS | Status: DC | PRN
  Administered 2022-07-02: 80 mg via INTRAVENOUS

## 2022-07-02 MED ORDER — SIGHTPATH DOSE#1 NA HYALUR & NA CHOND-NA HYALUR IO KIT
PACK | INTRAOCULAR | Status: DC | PRN
  Administered 2022-07-02: 1 via OPHTHALMIC

## 2022-07-02 MED ORDER — ACETAMINOPHEN 500 MG PO TABS
1000.0000 mg | ORAL_TABLET | Freq: Four times a day (QID) | ORAL | Status: DC | PRN
  Administered 2022-07-02: 1000 mg via ORAL

## 2022-07-02 SURGICAL SUPPLY — 19 items
CANNULA ANT/CHMB 27G (MISCELLANEOUS) IMPLANT
CANNULA ANT/CHMB 27GA (MISCELLANEOUS) ×1 IMPLANT
CATARACT SUITE SIGHTPATH (MISCELLANEOUS) ×1 IMPLANT
FEE CATARACT SUITE SIGHTPATH (MISCELLANEOUS) ×1 IMPLANT
GLOVE SRG 8 PF TXTR STRL LF DI (GLOVE) ×1 IMPLANT
GLOVE SURG ENC TEXT LTX SZ7.5 (GLOVE) ×1 IMPLANT
GLOVE SURG UNDER POLY LF SZ8 (GLOVE) ×1
LENS IOL ACRSF MP 19.5 (Intraocular Lens) IMPLANT
LENS IOL ACRYSOF POST 19.5 (Intraocular Lens) ×1 IMPLANT
LENS IOL DIOP 20.5 (Intraocular Lens) IMPLANT
LENS IOL TECNIS MONO 20.5 (Intraocular Lens) IMPLANT
NDL FILTER BLUNT 18X1 1/2 (NEEDLE) ×1 IMPLANT
NEEDLE FILTER BLUNT 18X1 1/2 (NEEDLE) ×1 IMPLANT
PACK VIT ANT 23G (MISCELLANEOUS) IMPLANT
RING MALYGIN 7.0 (MISCELLANEOUS) IMPLANT
SUT ETHILON 10-0 CS-B-6CS-B-6 (SUTURE) ×1
SUTURE EHLN 10-0 CS-B-6CS-B-6 (SUTURE) IMPLANT
SYR 3ML LL SCALE MARK (SYRINGE) ×1 IMPLANT
WATER STERILE IRR 250ML POUR (IV SOLUTION) ×1 IMPLANT

## 2022-07-02 NOTE — Anesthesia Preprocedure Evaluation (Addendum)
Anesthesia Evaluation  Patient identified by MRN, date of birth, ID band Patient awake    Reviewed: Allergy & Precautions, NPO status , Patient's Chart, lab work & pertinent test results  History of Anesthesia Complications Negative for: history of anesthetic complications  Airway Mallampati: II  TM Distance: >3 FB Neck ROM: full    Dental  (+) Upper Dentures, Lower Dentures   Pulmonary shortness of breath, COPD,  COPD inhaler Recurrent pleural effusions with multiple thoracentesis    Pulmonary exam normal        Cardiovascular Exercise Tolerance: Poor hypertension, On Medications and On Home Beta Blockers + CAD, + Past MI, + CABG and +CHF (EF <15%, global hypo/akinesis)  Normal cardiovascular exam+ pacemaker + Cardiac Defibrillator   Pulmonary HTN   Echo 10/23   SEVERE LV DYSFUNCTION   SEVERE RV SYSTOLIC DYSFUNCTION   VALVULAR REGURGITATION: MILD AR, MILD MR, MILD PR, TRIVIAL TR    NO VALVULAR STENOSIS     Neuro/Psych  Neuromuscular disease  negative psych ROS   GI/Hepatic negative GI ROS, Neg liver ROS,,,  Endo/Other  diabetes    Renal/GU Renal disease     Musculoskeletal  (+) Arthritis ,    Abdominal   Peds  Hematology  (+) Blood dyscrasia, anemia   Anesthesia Other Findings Past Medical History: No date: AICD (automatic cardioverter/defibrillator) present No date: Arthritis No date: Cataract     Comment:  Mixed OU No date: CHF (congestive heart failure) (HCC) No date: Chronic kidney disease No date: COPD (chronic obstructive pulmonary disease) ([place]) 05/23/2022: COVID-19 No date: Diabetes ([place]) No date: Diabetic retinopathy (HCC)     Comment:  NPDR OU No date: Heart disease No date: Hypertension No date: Hypertensive retinopathy     Comment:  OU No date: Indigestion No date: Presence of permanent cardiac pacemaker No date: 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 4 vessel 08/07/2021: CATARACT EXTRACTION W/PHACO; Left     Comment:  Procedure: CATARACT EXTRACTION PHACO AND INTRAOCULAR               LENS PLACEMENT ([place]) LEFT DIABETIC 9.06 01:42.2;                Surgeon: Leandrew Koyanagi, MD;  Location: [facility];  Service: Ophthalmology;  Laterality: Left;              Diabetic No date: CHOLECYSTECTOMY 12/23/2010: COLONOSCOPY No date: THORACENTESIS     Comment:  02/25/21, 10/17/21  BMI    Body Mass Index: 21.64 kg/m      Reproductive/Obstetrics negative OB ROS                             Anesthesia Physical Anesthesia Plan  ASA: 4  Anesthesia Plan: MAC   Post-op Pain Management:    Induction: Intravenous  PONV Risk Score and Plan:   Airway Management Planned: Natural Airway and Nasal Cannula  Additional Equipment:   Intra-op Plan:   Post-operative Plan:   Informed Consent: I have reviewed the patients History and Physical, chart, labs and discussed the procedure including the risks, benefits and  alternatives for the proposed anesthesia with the patient or authorized representative who has indicated his/her understanding and acceptance.     Dental Advisory Given  Plan Discussed with: Anesthesiologist, CRNA and Surgeon  Anesthesia Plan Comments: (Patient consented for risks of anesthesia including but not limited to:  - adverse reactions to medications - damage to eyes, teeth, lips or other oral mucosa - nerve damage due to positioning  - sore throat or hoarseness - Damage to heart, brain, nerves, lungs, other parts of body or loss of life  Patient voiced understanding.)        Anesthesia Quick  Evaluation

## 2022-07-02 NOTE — Transfer of Care (Signed)
Immediate Anesthesia Transfer of Care Note  Patient: Rick Zuniga  Procedure(s) Performed: CATARACT EXTRACTION PHACO AND INTRAOCULAR LENS PLACEMENT ([facility]) RIGHT VISION BLUE HEALON 5 DIABETIC (Right: Eye) ANTERIOR VITRECTOMY (Right: Eye)  Patient Location: PACU  Anesthesia Type: MAC  Level of Consciousness: awake, alert  and patient cooperative  Airway and Oxygen Therapy: Patient Spontanous Breathing and Patient connected to supplemental oxygen  Post-op Assessment: Post-op Vital signs reviewed, Patient's Cardiovascular Status Stable, Respiratory Function Stable, Patent Airway and No signs of Nausea or vomiting  Post-op Vital Signs: Reviewed and stable  Complications: There were no known notable events for this encounter.

## 2022-07-02 NOTE — H&P (Signed)
[facility]   Primary Care Physician:  [name], MD Ophthalmologist: Dr. Leandrew Koyanagi  Pre-Procedure History & Physical: HPI:  Rick Zuniga is a 76 y.o. male here for ophthalmic surgery.   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 4 vessel   CATARACT EXTRACTION W/PHACO Left 08/07/2021   Procedure: CATARACT EXTRACTION PHACO AND INTRAOCULAR LENS PLACEMENT ([facility]) LEFT DIABETIC 9.06 01:42.2;  Surgeon: Leandrew Koyanagi, MD;  Location: [facility];  Service: Ophthalmology;  Laterality: Left;  Diabetic   CHOLECYSTECTOMY     COLONOSCOPY  12/23/2010   THORACENTESIS     02/25/21, 10/17/21    Prior to Admission medications   Medication Sig Start Date End Date Taking? Authorizing Provider  aspirin EC 81 MG tablet Take 81 mg by mouth daily.   Yes [provider]  Continuous Blood Gluc Receiver (FREESTYLE LIBRE 14 DAY READER) DEVI Use 1 Device as directed Dx: E11.29 02/22/20  Yes [provider]  Continuous Blood Gluc Sensor (FREESTYLE LIBRE 14 DAY SENSOR) MISC SMARTSIG:1 Kit(s) Topical Every 2 Weeks 10/29/20  Yes [provider]  glipiZIDE (GLUCOTROL) 10 MG tablet Take 10 mg by  mouth 2 (two) times daily. 5 mg in the morning   Yes [provider]  insulin degludec (TRESIBA) 100 UNIT/ML FlexTouch Pen Inject 10 Units into the skin daily.   Yes [provider]  losartan (COZAAR) 25 MG tablet Take 25 mg by mouth daily.   Yes [provider]  nitroGLYCERIN (NITROSTAT) 0.4 MG SL tablet Place 0.4 mg under the tongue every 5 (five) minutes as needed for chest pain.   Yes [provider]  tamsulosin (FLOMAX) 0.4 MG CAPS capsule Take 0.4 mg by mouth daily.   Yes [provider]  torsemide (DEMADEX) 20 MG tablet Take 20 mg by mouth 2 (two) times daily.  12/05/19  Yes [provider]  meclizine (ANTIVERT) 25 MG tablet Take 1 tablet (25 mg total) by mouth 3 (three) times daily as needed for dizziness. 03/02/20   Sharen Hones, MD    Allergies as of 06/26/2022 - Review Complete 06/26/2022  Allergen Reaction Noted   Pioglitazone Swelling 02/14/2016   Furosemide Other (See Comments) 03/05/2020   Metformin and related Diarrhea 02/08/2020   Atorvastatin Rash 02/14/2016   Benazepril Rash 02/14/2016   Latex Rash 02/14/2016   Other Rash 02/14/2016   Tape Rash 02/14/2016    Family History  Problem Relation Age of Onset   CAD Mother    Kidney failure Father    Diabetes Father     Social History   Socioeconomic History  Marital status: Single    Spouse name: Not on file   Number of children: Not on file   Years of education: Not on file   Highest education level: Not on file  Occupational History   Not on file  Tobacco Use   Smoking status: Never   Smokeless tobacco: Never  Vaping Use   Vaping Use: Never used  Substance and Sexual Activity   Alcohol use: No   Drug use: No   Sexual activity: Not on file  Other Topics Concern   Not on file  Social History Narrative   Not on file   Social Determinants of Health   Financial Resource Strain: Not on file  Food Insecurity: Not on file  Transportation Needs: Not on  file  Physical Activity: Not on file  Stress: Not on file  Social Connections: Not on file  Intimate Partner Violence: Not on file    Review of Systems: See HPI, otherwise negative ROS  Physical Exam: BP 135/65   Pulse 71   Temp (!) 97.1 F (36.2 C) (Tympanic)   Resp 19   Ht 6' 0.99" (1.854 m)   Wt 81 kg   SpO2 98%   BMI 23.57 kg/m  General:   Alert,  pleasant and cooperative in NAD Head:  Normocephalic and atraumatic. Lungs:  Clear to auscultation.    Heart:  Regular rate and rhythm.   Impression/Plan: Rick Zuniga is here for ophthalmic surgery.  Risks, benefits, limitations, and alternatives regarding ophthalmic surgery have been reviewed with the patient.  Questions have been answered.  All parties agreeable.   Leandrew Koyanagi, MD  07/02/2022, 9:25 AM

## 2022-07-02 NOTE — Anesthesia Postprocedure Evaluation (Signed)
Anesthesia Post Note  Patient: AVARI GELLES  Procedure(s) Performed: CATARACT EXTRACTION PHACO AND INTRAOCULAR LENS PLACEMENT ([facility]) RIGHT VISION BLUE HEALON 5 DIABETIC  10.62  01:16.7 (Right: Eye) ANTERIOR VITRECTOMY (Right: Eye)  Patient location during evaluation: PACU Anesthesia Type: MAC Level of consciousness: awake and alert Pain management: pain level controlled Vital Signs Assessment: post-procedure vital signs reviewed and stable Respiratory status: spontaneous breathing, nonlabored ventilation, respiratory function stable and patient connected to nasal cannula oxygen Cardiovascular status: stable and blood pressure returned to baseline Postop Assessment: no apparent nausea or vomiting Anesthetic complications: no   There were no known notable events for this encounter.   Last Vitals:  Vitals:   07/02/22 0909 07/02/22 1049  BP: 135/65 130/75  Pulse: 71 65  Resp: 19 15  Temp: (!) 36.2 C 36.5 C  SpO2: 98% 97%    Last Pain:  Vitals:   07/02/22 1049  TempSrc:   PainSc: 4                  Ilene Qua

## 2022-07-02 NOTE — Op Note (Signed)
OPERATIVE NOTE  THOMA PAULSEN [id number] 07/02/2022   PREOPERATIVE DIAGNOSIS:  H25.89 Cataract            Mature (Total) Cataract Right Eye H25.89   POSTOPERATIVE DIAGNOSIS: Mature cataract with miotic pupil   PROCEDURE:  Phacoemusification with posterior chamber intraocular lens placement of the right eye .  Vision Blue dye was used to stain the lens capsule. Malyugin ring was placed for miotic pupil.  Anterior vitrectomy.  LENS:   Implant Name Type Inv. Item Serial No. Manufacturer Lot No. LRB No. Used Action  LENS IOL DIOP 20.5 - [id number] Intraocular Lens LENS IOL DIOP 20.5 [id number] [employer]  Right 1 Wasted  LENS IOL ACRYSOF POST 19.5 - [id number] Intraocular Lens LENS IOL ACRYSOF POST 19.5 [id number] [employer]  Right 1 Implanted    First lens not used,.  Second lens placed into sulcus   ULTRASOUND TIME:  1 minutes 17 seconds, CDE 10.6  SURGEON:  Wyonia Hough, MD   ANESTHESIA:  Topical with tetracaine drops augmented with 1% preservative-free intracameral lidocaine.   COMPLICATIONS:  Anterior capsule radial tear with extension requiring anterior vitrectomy and sulcus lens placement.   DESCRIPTION OF PROCEDURE: The patient was identified in the holding room and transported to the operating room and placed in the supine position under the operating microscope.  The right eye was identified as the operative eye and it was prepped and draped in the usual sterile ophthalmic fashion.  A 1 millimeter clear-corneal paracentesis was made at the 12:00 position.  0.5 ml of preservative-free 1% lidocaine was injected into the anterior chamber. The anterior chamber was filled with Healon 5 viscoelastic.    Vision Blue dye was then injected under the viscoelastic to stain the lens capsule.  BSS was then used to wash the dye out.  Additional Healon 5 was placed into the anterior chamber. A 2.4 millimeter keratome was used to make a near-clear corneal incision at  the 9:00 position.  A Malyugin ring was placed to enlarge the pupil to 68mm. A curvilinear capsulorrhexis was made with a cystotome and capsulorrhexis forceps.  Balanced salt solution was used to hydrodissect and hydrodelineate the nucleus.  Viscoat was then placed in the anterior chamber.   Phacoemulsification was then used in stop and chop fashion to remove the lens nucleus and epinucleus.  During removal of the last lens nuclear particle, a radial tear was noted at the temporal location with extentsion to a small break in the posterior capsule.  Additional viscoelastic was placed into the anterior chamber and the instruments were removed.   A new paracentesis was added at the 2:00 position. Bimanual anterior vitrectomy was performed to remove the vitreous through the small posterior capsule opening and lens cortex.  The capsulorrhexis was adequate for sulcus lens placement and there was no vitreous noted in the anterior chamber.  Wounds were checked with Weck cells and noted to be free of incarcerated vitreous strands.  The ciliary sulcus was dilated with Provisc viscoelastic.  The 2.4 mm corneal wound was enlarged to approximately 3 mm for intraocular lens placement.  An MN60AC 19.5  lens was inserted in to the ciliary sulcus.  The haptics were well-positioned in the sulcus. The Malugin ring was removed. It caught the haptic of the IOL and additional manipulation was required to free the IL from the haptic.  There was some iris prolapse and iris bleeding through this.  The bimanual vitrector was used to aspirate the viscoelastic. Wounds were again checked  to ensure no vitreous was present.  The lens was well centered.  A 10-0 nylon suture was placed through the 3.0 mm incision.   Wounds were hydrated with balanced salt solution.  The anterior chamber was inflated to a physiologic pressure with balanced salt solution. Cefuroxime 0.1 ml of a 10mg /ml solution was injected into the anterior chamber for a dose  of 1 mg of intracameral antibiotic at the completion of the case.  No wound leaks were noted.  Topical Timolol and Brimonidine drops were applied to the eye.  The patient was taken to the recovery room in stable condition without complications of anesthesia or surgery.  Kebin Maye 07/02/2022, 10:46 AM

## 2022-07-03 ENCOUNTER — Encounter: Payer: Self-pay | Admitting: Ophthalmology

## 2022-07-14 ENCOUNTER — Encounter: Payer: Self-pay | Admitting: Ophthalmology

## 2022-07-14 ENCOUNTER — Other Ambulatory Visit: Payer: Self-pay

## 2022-07-14 NOTE — Discharge Instructions (Signed)

## 2022-07-16 ENCOUNTER — Encounter
Admission: RE | Disposition: A | Discharge status: Home / Self Care | Payer: Self-pay | Source: Home / Self Care | Attending: Ophthalmology

## 2022-07-16 ENCOUNTER — Encounter: Payer: Self-pay | Admitting: Ophthalmology

## 2022-07-16 ENCOUNTER — Other Ambulatory Visit: Payer: Self-pay

## 2022-07-16 ENCOUNTER — Ambulatory Visit
Admission: RE | Admit: 2022-07-16 | Discharge: 2022-07-16 | Disposition: A | Discharge status: Home / Self Care | Payer: Medicare Other | Attending: Ophthalmology | Admitting: Ophthalmology

## 2022-07-16 ENCOUNTER — Ambulatory Visit: Payer: Medicare Other | Admitting: Anesthesiology

## 2022-07-16 DIAGNOSIS — E1122 Type 2 diabetes mellitus with diabetic chronic kidney disease: Secondary | ICD-10-CM | POA: Diagnosis not present

## 2022-07-16 DIAGNOSIS — N189 Chronic kidney disease, unspecified: Secondary | ICD-10-CM | POA: Insufficient documentation

## 2022-07-16 DIAGNOSIS — I509 Heart failure, unspecified: Secondary | ICD-10-CM | POA: Diagnosis not present

## 2022-07-16 DIAGNOSIS — Z95810 Presence of automatic (implantable) cardiac defibrillator: Secondary | ICD-10-CM | POA: Insufficient documentation

## 2022-07-16 DIAGNOSIS — D649 Anemia, unspecified: Secondary | ICD-10-CM | POA: Diagnosis not present

## 2022-07-16 DIAGNOSIS — H59021 Cataract (lens) fragments in eye following cataract surgery, right eye: Secondary | ICD-10-CM | POA: Diagnosis present

## 2022-07-16 DIAGNOSIS — I130 Hypertensive heart and chronic kidney disease with heart failure and stage 1 through stage 4 chronic kidney disease, or unspecified chronic kidney disease: Secondary | ICD-10-CM | POA: Insufficient documentation

## 2022-07-16 DIAGNOSIS — I252 Old myocardial infarction: Secondary | ICD-10-CM | POA: Insufficient documentation

## 2022-07-16 DIAGNOSIS — Z951 Presence of aortocoronary bypass graft: Secondary | ICD-10-CM | POA: Insufficient documentation

## 2022-07-16 DIAGNOSIS — J449 Chronic obstructive pulmonary disease, unspecified: Secondary | ICD-10-CM | POA: Diagnosis not present

## 2022-07-16 DIAGNOSIS — D759 Disease of blood and blood-forming organs, unspecified: Secondary | ICD-10-CM | POA: Diagnosis not present

## 2022-07-16 HISTORY — DX: Other complications of anesthesia, initial encounter: T88.59XA

## 2022-07-16 HISTORY — PX: CATARACT EXTRACTION W/PHACO: SHX586

## 2022-07-16 LAB — GLUCOSE, CAPILLARY: Glucose-Capillary: 95 mg/dL (ref 70–99)

## 2022-07-16 SURGERY — PHACOEMULSIFICATION, CATARACT, WITH IOL INSERTION
Anesthesia: Monitor Anesthesia Care | Site: Eye | Laterality: Right

## 2022-07-16 MED ORDER — BRIMONIDINE TARTRATE-TIMOLOL 0.2-0.5 % OP SOLN
OPHTHALMIC | Status: DC | PRN
  Administered 2022-07-16: 1 [drp] via OPHTHALMIC

## 2022-07-16 MED ORDER — SIGHTPATH DOSE#1 BSS IO SOLN
INTRAOCULAR | Status: DC | PRN
  Administered 2022-07-16: 1 mL via INTRAMUSCULAR

## 2022-07-16 MED ORDER — SIGHTPATH DOSE#1 BSS IO SOLN
INTRAOCULAR | Status: DC | PRN
  Administered 2022-07-16: 25 mL via OPHTHALMIC

## 2022-07-16 MED ORDER — TETRACAINE HCL 0.5 % OP SOLN
1.0000 [drp] | OPHTHALMIC | Status: DC | PRN
  Administered 2022-07-16 (×3): 1 [drp] via OPHTHALMIC

## 2022-07-16 MED ORDER — SIGHTPATH DOSE#1 BSS IO SOLN
INTRAOCULAR | Status: DC | PRN
  Administered 2022-07-16: 15 mL

## 2022-07-16 MED ORDER — ONDANSETRON HCL 4 MG/2ML IJ SOLN: 4.0000 mg | Freq: Once | INTRAMUSCULAR | Status: DC | PRN

## 2022-07-16 MED ORDER — CEFUROXIME OPHTHALMIC INJECTION 1 MG/0.1 ML
INJECTION | OPHTHALMIC | Status: DC | PRN
  Administered 2022-07-16: .1 mL via INTRACAMERAL

## 2022-07-16 MED ORDER — LACTATED RINGERS IV SOLN: INTRAVENOUS | Status: DC

## 2022-07-16 MED ORDER — FENTANYL CITRATE (PF) 100 MCG/2ML IJ SOLN
INTRAMUSCULAR | Status: DC | PRN
  Administered 2022-07-16: 50 ug via INTRAVENOUS

## 2022-07-16 MED ORDER — ARMC OPHTHALMIC DILATING DROPS
1.0000 | Freq: Once | OPHTHALMIC | Status: AC
  Administered 2022-07-16 (×3): 1 via OPHTHALMIC

## 2022-07-16 MED ORDER — SODIUM HYALURONATE 10 MG/ML IO SOLUTION
PREFILLED_SYRINGE | INTRAOCULAR | Status: DC | PRN
  Administered 2022-07-16: .55 mL via INTRAOCULAR

## 2022-07-16 SURGICAL SUPPLY — 10 items
CATARACT SUITE SIGHTPATH (MISCELLANEOUS) ×1 IMPLANT
FEE CATARACT SUITE SIGHTPATH (MISCELLANEOUS) ×1 IMPLANT
GLOVE SRG 8 PF TXTR STRL LF DI (GLOVE) ×1 IMPLANT
GLOVE SURG GAMMEX PI TX LF 7.5 (GLOVE) IMPLANT
GLOVE SURG UNDER POLY LF SZ8 (GLOVE) ×1
NDL FILTER BLUNT 18X1 1/2 (NEEDLE) ×1 IMPLANT
NEEDLE FILTER BLUNT 18X1 1/2 (NEEDLE) ×1 IMPLANT
PACK VIT ANT 23G (MISCELLANEOUS) IMPLANT
SYR 3ML LL SCALE MARK (SYRINGE) ×1 IMPLANT
WATER STERILE IRR 250ML POUR (IV SOLUTION) ×1 IMPLANT

## 2022-07-16 NOTE — Op Note (Signed)
  LOCATION:  [facility]   PREOPERATIVE DIAGNOSIS:  Retained nuclear lens fragment in anterior chamber of the right eye   POSTOPERATIVE DIAGNOSIS:   Retained nuclear lens fragment in anterior chamber of the right eye   PROCEDURE:  Aspiration and removal of retained nuclear lens fragment in anterior chamber of the right eye.  Anterior vitrectomy.   SURGEON:  Wyonia Hough, MD   ANESTHESIA:  Topical with tetracaine drops, augmented with 1% preservative-free intracameral lidocaine.    COMPLICATIONS:  None.   DESCRIPTION OF PROCEDURE:  The patient was identified in the holding room and transported to the operating room and placed in the supine position under the operating microscope.  The right eye was identified as the operative eye and it was prepped and draped in the usual sterile ophthalmic fashion.   A 1 millimeter clear-corneal paracentesis was made at the 12:00 position.  0.5 ml of preservative-free 1% lidocaine was injected into the anterior chamber. The anterior chamber was filled with Viscoat viscoelastic.  A 1.0 millimeter keratome was used to enter the eye at the 3:00 position.   Provisc was used to isolate the three lens fragments in the anterior chamber.  The anterior vitrector was used in a bimanual fashion were used to remove the lens fragments.  Additional anterior vitrectomy was performed under the IOL. Additional provisc was placed under the IOL.  The IOL was rotated with to ensure that it was fully within the ciliary sulcus.  The remaining viscoelastic was aspirated.   Wounds were hydrated with balanced salt solution.  The anterior chamber was inflated to a physiologic pressure with balanced salt solution.  No wound leaks were noted. Cefuroxime 0.1 ml of a 10mg /ml solution was injected into the anterior chamber for a dose of 1 mg of intracameral antibiotic at the completion of the case.   Timolol and Brimonidine drops were applied to the eye.  The patient  was taken to the recovery room in stable condition without complications of anesthesia or surgery.   Rick Zuniga 07/16/2022, 2:35 PM

## 2022-07-16 NOTE — Anesthesia Preprocedure Evaluation (Signed)
Anesthesia Evaluation  Patient identified by MRN, date of birth, ID band Patient awake    Reviewed: Allergy & Precautions, NPO status , Patient's Chart, lab work & pertinent test results  History of Anesthesia Complications Negative for: history of anesthetic complications  Airway Mallampati: II  TM Distance: >3 FB Neck ROM: full    Dental  (+) Upper Dentures, Lower Dentures   Pulmonary shortness of breath, COPD,  COPD inhaler Recurrent pleural effusions with multiple thoracentesis    Pulmonary exam normal        Cardiovascular Exercise Tolerance: Poor hypertension, On Medications and On Home Beta Blockers + CAD, + Past MI, + CABG and +CHF (EF <15%, global hypo/akinesis)  Normal cardiovascular exam+ pacemaker + Cardiac Defibrillator   Pulmonary HTN   Echo 10/23   SEVERE LV DYSFUNCTION   SEVERE RV SYSTOLIC DYSFUNCTION   VALVULAR REGURGITATION: MILD AR, MILD MR, MILD PR, TRIVIAL TR    NO VALVULAR STENOSIS     Neuro/Psych  Neuromuscular disease  negative psych ROS   GI/Hepatic negative GI ROS, Neg liver ROS,,,  Endo/Other  diabetes    Renal/GU Renal disease     Musculoskeletal  (+) Arthritis ,    Abdominal   Peds  Hematology  (+) Blood dyscrasia, anemia   Anesthesia Other Findings Past Medical History: No date: AICD (automatic cardioverter/defibrillator) present No date: Arthritis No date: Cataract     Comment:  Mixed OU No date: CHF (congestive heart failure) (HCC) No date: Chronic kidney disease No date: COPD (chronic obstructive pulmonary disease) (HCC) 05/23/2022: COVID-19 No date: Diabetes (HCC) No date: Diabetic retinopathy (HCC)     Comment:  NPDR OU No date: Heart disease No date: Hypertension No date: Hypertensive retinopathy     Comment:  OU No date: Indigestion No date: Presence of permanent cardiac pacemaker No date: 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 4 vessel 08/07/2021: CATARACT EXTRACTION W/PHACO; Left     Comment:  Procedure: CATARACT EXTRACTION PHACO AND INTRAOCULAR               LENS PLACEMENT ([facility]) LEFT DIABETIC 9.06 01:42.2;                Surgeon: Brasington, Chadwick, MD;  Location: [facility];  Service: Ophthalmology;  Laterality: Left;              Diabetic No date: CHOLECYSTECTOMY 12/23/2010: COLONOSCOPY No date: THORACENTESIS     Comment:  02/25/21, 10/17/21  BMI    Body Mass Index: 21.64 kg/m      Reproductive/Obstetrics negative OB ROS                             Anesthesia Physical Anesthesia Plan  ASA: 4  Anesthesia Plan: MAC   Post-op Pain Management:    Induction: Intravenous  PONV Risk Score and Plan:   Airway Management Planned: Natural Airway and Nasal Cannula  Additional Equipment:   Intra-op Plan:   Post-operative Plan:   Informed Consent: I have reviewed the patients History and Physical, chart, labs and discussed the procedure including the risks, benefits and   alternatives for the proposed anesthesia with the patient or authorized representative who has indicated his/her understanding and acceptance.     Dental Advisory Given  Plan Discussed with: Anesthesiologist, CRNA and Surgeon  Anesthesia Plan Comments: (Patient consented for risks of anesthesia including but not limited to:  - adverse reactions to medications - damage to eyes, teeth, lips or other oral mucosa - nerve damage due to positioning  - sore throat or hoarseness - Damage to heart, brain, nerves, lungs, other parts of body or loss of life  Patient voiced understanding.)        Anesthesia Quick  Evaluation  

## 2022-07-16 NOTE — Anesthesia Postprocedure Evaluation (Signed)
Anesthesia Post Note  Patient: Rick Zuniga  Procedure(s) Performed: REMOVAL OF LENS FRAMENTS RIGHT DIABETIC (Right: Eye)  Patient location during evaluation: PACU Anesthesia Type: MAC Level of consciousness: awake and alert Pain management: pain level controlled Vital Signs Assessment: post-procedure vital signs reviewed and stable Respiratory status: spontaneous breathing, nonlabored ventilation, respiratory function stable and patient connected to nasal cannula oxygen Cardiovascular status: stable and blood pressure returned to baseline Postop Assessment: no apparent nausea or vomiting Anesthetic complications: no   There were no known notable events for this encounter.   Last Vitals:  Vitals:   07/16/22 1314 07/16/22 1439  BP: 138/77 (!) 143/88  Pulse: 69 75  Resp: 18 16  Temp: 36.4 C 36.6 C  SpO2: 100% 97%    Last Pain:  Vitals:   07/16/22 1439  TempSrc:   PainSc: 0-No pain                 Arita Miss

## 2022-07-16 NOTE — Transfer of Care (Signed)
Immediate Anesthesia Transfer of Care Note  Patient: Rick Zuniga  Procedure(s) Performed: REMOVAL OF LENS FRAMENTS RIGHT DIABETIC (Right: Eye)  Patient Location: PACU  Anesthesia Type: MAC  Level of Consciousness: awake, alert  and patient cooperative  Airway and Oxygen Therapy: Patient Spontanous Breathing and Patient connected to supplemental oxygen  Post-op Assessment: Post-op Vital signs reviewed, Patient's Cardiovascular Status Stable, Respiratory Function Stable, Patent Airway and No signs of Nausea or vomiting  Post-op Vital Signs: Reviewed and stable  Complications: No notable events documented.

## 2022-07-16 NOTE — H&P (Signed)
[place]   Primary Care Physician:  [name], MD Ophthalmologist: Dr. Leandrew Koyanagi  Pre-Procedure History & Physical: HPI:  Rick Zuniga is a 76 y.o. male here for ophthalmic surgery.   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 4 vessel   CATARACT EXTRACTION W/PHACO Left 08/07/2021   Procedure: CATARACT EXTRACTION PHACO AND INTRAOCULAR LENS PLACEMENT (IOC) LEFT DIABETIC 9.06 01:42.2;  Surgeon: Leandrew Koyanagi, MD;  Location: [facility];  Service: Ophthalmology;  Laterality: Left;  Diabetic   CATARACT EXTRACTION W/PHACO Right 07/02/2022   Procedure: CATARACT EXTRACTION PHACO AND INTRAOCULAR LENS PLACEMENT (IOC) RIGHT VISION BLUE HEALON 5 DIABETIC  10.62  01:16.7;  Surgeon: Leandrew Koyanagi, MD;  Location: [facility];  Service: Ophthalmology;  Laterality: Right;   CHOLECYSTECTOMY      COLONOSCOPY  12/23/2010   THORACENTESIS     02/25/21, 10/17/21    Prior to Admission medications   Medication Sig Start Date End Date Taking? Authorizing Provider  aspirin EC 81 MG tablet Take 81 mg by mouth daily.   Yes [provider]  Continuous Blood Gluc Receiver (FREESTYLE LIBRE 14 DAY READER) DEVI Use 1 Device as directed Dx: E11.29 02/22/20  Yes [provider]  glipiZIDE (GLUCOTROL) 10 MG tablet Take 10 mg by mouth 2 (two) times daily. 5 mg in the morning   Yes [provider]  insulin degludec (TRESIBA) 100 UNIT/ML FlexTouch Pen Inject 10 Units into the skin daily.   Yes [provider]  losartan (COZAAR) 25 MG tablet Take 25 mg by mouth daily.   Yes [provider]  meclizine (ANTIVERT) 25 MG tablet Take 1 tablet (25 mg total) by mouth 3 (three) times daily as needed for dizziness. 03/02/20  Yes Sharen Hones, MD  torsemide (DEMADEX) 20 MG tablet Take 20 mg by mouth 2 (two) times daily.  12/05/19  Yes [provider]  Continuous Blood Gluc Sensor (FREESTYLE LIBRE 14 DAY SENSOR) MISC SMARTSIG:1 Kit(s) Topical Every 2 Weeks 10/29/20   [provider]  nitroGLYCERIN (NITROSTAT) 0.4 MG SL tablet Place 0.4 mg under the tongue every 5 (five) minutes as needed for chest pain.    [provider]  tamsulosin (FLOMAX) 0.4 MG CAPS capsule Take 0.4 mg by mouth daily. Patient not taking: Reported on 07/14/2022  [provider]    Allergies as of 07/14/2022 - Review Complete 07/02/2022  Allergen Reaction Noted   Pioglitazone Swelling 02/14/2016   Furosemide Other (See Comments) 03/05/2020   Metformin and related Diarrhea 02/08/2020   Atorvastatin Rash 02/14/2016   Benazepril Rash 02/14/2016   Latex Rash 02/14/2016   Other Rash 02/14/2016   Tape Rash 02/14/2016    Family History  Problem Relation Age of Onset   CAD Mother    Kidney failure Father    Diabetes Father     Social History   Socioeconomic History    Marital status: Single    Spouse name: Not on file   Number of children: Not on file   Years of education: Not on file   Highest education level: Not on file  Occupational History   Not on file  Tobacco Use   Smoking status: Never   Smokeless tobacco: Never  Vaping Use   Vaping Use: Never used  Substance and Sexual Activity   Alcohol use: No   Drug use: No   Sexual activity: Not on file  Other Topics Concern   Not on file  Social History Narrative   Not on file   Social Determinants of Health   Financial Resource Strain: Not on file  Food Insecurity: Not on file  Transportation Needs: Not on file  Physical Activity: Not on file  Stress: Not on file  Social Connections: Not on file  Intimate Partner Violence: Not on file    Review of Systems: See HPI, otherwise negative ROS  Physical Exam: BP 138/77   Pulse 69   Temp 97.6 F (36.4 C) (Temporal)   Resp 18   Ht _0  (1.854 m)   Wt 83.5 kg   SpO2 100%   BMI 24.28 kg/m  General:   Alert,  pleasant and cooperative in NAD Head:  Normocephalic and atraumatic. Lungs:  Clear to auscultation.    Heart:  Regular rate and rhythm.   Impression/Plan: Rick Zuniga is here for ophthalmic surgery.  Risks, benefits, limitations, and alternatives regarding ophthalmic surgery have been reviewed with the patient.  Questions have been answered.  All parties agreeable.   Leandrew Koyanagi, MD  07/16/2022, 1:29 PM

## 2022-07-17 ENCOUNTER — Other Ambulatory Visit: Payer: Self-pay | Admitting: Pulmonary Disease

## 2022-07-17 ENCOUNTER — Encounter: Payer: Self-pay | Admitting: Ophthalmology

## 2022-07-17 DIAGNOSIS — J90 Pleural effusion, not elsewhere classified: Secondary | ICD-10-CM

## 2022-07-18 ENCOUNTER — Other Ambulatory Visit: Payer: Self-pay | Admitting: Student

## 2022-07-18 ENCOUNTER — Ambulatory Visit
Admission: RE | Admit: 2022-07-18 | Discharge: 2022-07-18 | Disposition: A | Discharge status: Home / Self Care | Payer: Medicare Other | Source: Ambulatory Visit | Attending: Pulmonary Disease | Admitting: Pulmonary Disease

## 2022-07-18 ENCOUNTER — Ambulatory Visit
Admission: RE | Admit: 2022-07-18 | Discharge: 2022-07-18 | Disposition: A | Discharge status: Home / Self Care | Payer: Medicare Other | Source: Ambulatory Visit | Attending: Student | Admitting: Student

## 2022-07-18 ENCOUNTER — Other Ambulatory Visit: Payer: Self-pay | Admitting: Pulmonary Disease

## 2022-07-18 DIAGNOSIS — J189 Pneumonia, unspecified organism: Secondary | ICD-10-CM

## 2022-07-18 DIAGNOSIS — J90 Pleural effusion, not elsewhere classified: Secondary | ICD-10-CM | POA: Insufficient documentation

## 2022-07-18 HISTORY — PX: IR THORACENTESIS ASP PLEURAL SPACE W/IMG GUIDE: IMG5380

## 2022-07-18 MED ORDER — LIDOCAINE HCL 1 % IJ SOLN
INTRAMUSCULAR | Status: AC
  Administered 2022-07-18: 15 mL
  Filled 2022-07-18: qty 20

## 2022-07-18 NOTE — Procedures (Signed)
PROCEDURE SUMMARY:  Successful US guided left thoracentesis. Yielded 550 ml of clear yellow fluid. Pt tolerated procedure well. No immediate complications.  Specimen not sent for labs. CXR ordered; no post-procedure pneumothorax identified   EBL < 2 mL  Theresa Duty, NP 07/18/2022 12:55 PM

## 2022-08-08 ENCOUNTER — Emergency Department: Payer: Medicare Other

## 2022-08-08 ENCOUNTER — Inpatient Hospital Stay
Admission: EM | Admit: 2022-08-08 | Discharge: 2022-08-13 | DRG: 291 | Disposition: A | Discharge status: Home / Self Care | Payer: Medicare Other | Attending: Internal Medicine | Admitting: Internal Medicine

## 2022-08-08 ENCOUNTER — Other Ambulatory Visit: Payer: Self-pay

## 2022-08-08 ENCOUNTER — Observation Stay: Payer: Medicare Other

## 2022-08-08 DIAGNOSIS — I493 Ventricular premature depolarization: Secondary | ICD-10-CM | POA: Diagnosis present

## 2022-08-08 DIAGNOSIS — E1129 Type 2 diabetes mellitus with other diabetic kidney complication: Secondary | ICD-10-CM

## 2022-08-08 DIAGNOSIS — Z951 Presence of aortocoronary bypass graft: Secondary | ICD-10-CM

## 2022-08-08 DIAGNOSIS — E875 Hyperkalemia: Secondary | ICD-10-CM | POA: Diagnosis present

## 2022-08-08 DIAGNOSIS — K5900 Constipation, unspecified: Secondary | ICD-10-CM | POA: Insufficient documentation

## 2022-08-08 DIAGNOSIS — D61818 Other pancytopenia: Secondary | ICD-10-CM | POA: Diagnosis present

## 2022-08-08 DIAGNOSIS — N189 Chronic kidney disease, unspecified: Secondary | ICD-10-CM | POA: Diagnosis present

## 2022-08-08 DIAGNOSIS — D631 Anemia in chronic kidney disease: Secondary | ICD-10-CM | POA: Diagnosis present

## 2022-08-08 DIAGNOSIS — I130 Hypertensive heart and chronic kidney disease with heart failure and stage 1 through stage 4 chronic kidney disease, or unspecified chronic kidney disease: Secondary | ICD-10-CM | POA: Diagnosis not present

## 2022-08-08 DIAGNOSIS — Z8249 Family history of ischemic heart disease and other diseases of the circulatory system: Secondary | ICD-10-CM

## 2022-08-08 DIAGNOSIS — I5023 Acute on chronic systolic (congestive) heart failure: Secondary | ICD-10-CM | POA: Diagnosis present

## 2022-08-08 DIAGNOSIS — I340 Nonrheumatic mitral (valve) insufficiency: Secondary | ICD-10-CM | POA: Diagnosis present

## 2022-08-08 DIAGNOSIS — Z95810 Presence of automatic (implantable) cardiac defibrillator: Secondary | ICD-10-CM

## 2022-08-08 DIAGNOSIS — J9621 Acute and chronic respiratory failure with hypoxia: Secondary | ICD-10-CM | POA: Diagnosis present

## 2022-08-08 DIAGNOSIS — I5043 Acute on chronic combined systolic (congestive) and diastolic (congestive) heart failure: Secondary | ICD-10-CM | POA: Diagnosis present

## 2022-08-08 DIAGNOSIS — J449 Chronic obstructive pulmonary disease, unspecified: Secondary | ICD-10-CM | POA: Diagnosis present

## 2022-08-08 DIAGNOSIS — Z833 Family history of diabetes mellitus: Secondary | ICD-10-CM

## 2022-08-08 DIAGNOSIS — Z7189 Other specified counseling: Secondary | ICD-10-CM | POA: Diagnosis not present

## 2022-08-08 DIAGNOSIS — J90 Pleural effusion, not elsewhere classified: Secondary | ICD-10-CM | POA: Diagnosis not present

## 2022-08-08 DIAGNOSIS — I5082 Biventricular heart failure: Secondary | ICD-10-CM | POA: Diagnosis present

## 2022-08-08 DIAGNOSIS — I509 Heart failure, unspecified: Secondary | ICD-10-CM

## 2022-08-08 DIAGNOSIS — E1122 Type 2 diabetes mellitus with diabetic chronic kidney disease: Secondary | ICD-10-CM | POA: Diagnosis present

## 2022-08-08 DIAGNOSIS — E8770 Fluid overload, unspecified: Secondary | ICD-10-CM

## 2022-08-08 DIAGNOSIS — I2510 Atherosclerotic heart disease of native coronary artery without angina pectoris: Secondary | ICD-10-CM | POA: Diagnosis present

## 2022-08-08 DIAGNOSIS — Z7984 Long term (current) use of oral hypoglycemic drugs: Secondary | ICD-10-CM

## 2022-08-08 DIAGNOSIS — Z888 Allergy status to other drugs, medicaments and biological substances status: Secondary | ICD-10-CM

## 2022-08-08 DIAGNOSIS — N184 Chronic kidney disease, stage 4 (severe): Secondary | ICD-10-CM | POA: Diagnosis present

## 2022-08-08 DIAGNOSIS — I2722 Pulmonary hypertension due to left heart disease: Secondary | ICD-10-CM | POA: Diagnosis present

## 2022-08-08 DIAGNOSIS — Z841 Family history of disorders of kidney and ureter: Secondary | ICD-10-CM

## 2022-08-08 DIAGNOSIS — D696 Thrombocytopenia, unspecified: Secondary | ICD-10-CM | POA: Diagnosis present

## 2022-08-08 DIAGNOSIS — Z794 Long term (current) use of insulin: Secondary | ICD-10-CM

## 2022-08-08 DIAGNOSIS — N179 Acute kidney failure, unspecified: Secondary | ICD-10-CM | POA: Diagnosis not present

## 2022-08-08 DIAGNOSIS — Z7982 Long term (current) use of aspirin: Secondary | ICD-10-CM

## 2022-08-08 DIAGNOSIS — Z79899 Other long term (current) drug therapy: Secondary | ICD-10-CM

## 2022-08-08 DIAGNOSIS — I10 Essential (primary) hypertension: Secondary | ICD-10-CM | POA: Diagnosis present

## 2022-08-08 DIAGNOSIS — Z91040 Latex allergy status: Secondary | ICD-10-CM

## 2022-08-08 DIAGNOSIS — R0602 Shortness of breath: Principal | ICD-10-CM

## 2022-08-08 DIAGNOSIS — Z91048 Other nonmedicinal substance allergy status: Secondary | ICD-10-CM

## 2022-08-08 DIAGNOSIS — Z8616 Personal history of COVID-19: Secondary | ICD-10-CM

## 2022-08-08 DIAGNOSIS — E113293 Type 2 diabetes mellitus with mild nonproliferative diabetic retinopathy without macular edema, bilateral: Secondary | ICD-10-CM | POA: Diagnosis present

## 2022-08-08 DIAGNOSIS — R809 Proteinuria, unspecified: Secondary | ICD-10-CM

## 2022-08-08 DIAGNOSIS — I428 Other cardiomyopathies: Secondary | ICD-10-CM | POA: Diagnosis present

## 2022-08-08 LAB — CBC
HCT: 28.8 % — ABNORMAL LOW (ref 39.0–52.0)
Hemoglobin: 9 g/dL — ABNORMAL LOW (ref 13.0–17.0)
MCH: 33.6 pg (ref 26.0–34.0)
MCHC: 31.3 g/dL (ref 30.0–36.0)
MCV: 107.5 fL — ABNORMAL HIGH (ref 80.0–100.0)
Platelets: 105 10*3/uL — ABNORMAL LOW (ref 150–400)
RBC: 2.68 MIL/uL — ABNORMAL LOW (ref 4.22–5.81)
RDW: 15.6 % — ABNORMAL HIGH (ref 11.5–15.5)
WBC: 4.4 10*3/uL (ref 4.0–10.5)
nRBC: 0 % (ref 0.0–0.2)

## 2022-08-08 LAB — BASIC METABOLIC PANEL
Anion gap: 8 (ref 5–15)
BUN: 87 mg/dL — ABNORMAL HIGH (ref 8–23)
CO2: 21 mmol/L — ABNORMAL LOW (ref 22–32)
Calcium: 8.8 mg/dL — ABNORMAL LOW (ref 8.9–10.3)
Chloride: 111 mmol/L (ref 98–111)
Creatinine, Ser: 3.24 mg/dL — ABNORMAL HIGH (ref 0.61–1.24)
GFR, Estimated: 19 mL/min — ABNORMAL LOW (ref >60)
Glucose, Bld: 133 mg/dL — ABNORMAL HIGH (ref 70–99)
Potassium: 5.2 mmol/L — ABNORMAL HIGH (ref 3.5–5.1)
Sodium: 140 mmol/L (ref 135–145)

## 2022-08-08 LAB — TROPONIN I (HIGH SENSITIVITY)
Troponin I (High Sensitivity): 44 ng/L — ABNORMAL HIGH (ref <18)
Troponin I (High Sensitivity): 47 ng/L — ABNORMAL HIGH (ref <18)

## 2022-08-08 LAB — CBG MONITORING, ED: Glucose-Capillary: 152 mg/dL — ABNORMAL HIGH (ref 70–99)

## 2022-08-08 LAB — BRAIN NATRIURETIC PEPTIDE: B Natriuretic Peptide: 3152.8 pg/mL — ABNORMAL HIGH (ref 0.0–100.0)

## 2022-08-08 MED ORDER — SODIUM CHLORIDE 0.9% FLUSH: 3.0000 mL | INTRAVENOUS | Status: DC | PRN

## 2022-08-08 MED ORDER — SODIUM CHLORIDE 0.9 % IV SOLN: 250.0000 mL | INTRAVENOUS | Status: DC | PRN

## 2022-08-08 MED ORDER — HEPARIN SODIUM (PORCINE) 5000 UNIT/ML IJ SOLN
5000.0000 [IU] | Freq: Three times a day (TID) | INTRAMUSCULAR | Status: DC
  Administered 2022-08-08 – 2022-08-13 (×14): 5000 [IU] via SUBCUTANEOUS
  Filled 2022-08-08 (×14): qty 1

## 2022-08-08 MED ORDER — SODIUM CHLORIDE 0.9% FLUSH
3.0000 mL | Freq: Two times a day (BID) | INTRAVENOUS | Status: DC
  Administered 2022-08-08 – 2022-08-13 (×8): 3 mL via INTRAVENOUS

## 2022-08-08 MED ORDER — INSULIN ASPART 100 UNIT/ML IJ SOLN
0.0000 [IU] | Freq: Three times a day (TID) | INTRAMUSCULAR | Status: DC
  Administered 2022-08-09: 2 [IU] via SUBCUTANEOUS
  Administered 2022-08-09: 3 [IU] via SUBCUTANEOUS
  Administered 2022-08-10: 5 [IU] via SUBCUTANEOUS
  Administered 2022-08-10: 2 [IU] via SUBCUTANEOUS
  Administered 2022-08-11 (×3): 3 [IU] via SUBCUTANEOUS
  Administered 2022-08-12: 8 [IU] via SUBCUTANEOUS
  Administered 2022-08-12 – 2022-08-13 (×2): 2 [IU] via SUBCUTANEOUS
  Administered 2022-08-13: 5 [IU] via SUBCUTANEOUS
  Filled 2022-08-08 (×11): qty 1

## 2022-08-08 MED ORDER — FUROSEMIDE 10 MG/ML IJ SOLN
80.0000 mg | Freq: Once | INTRAMUSCULAR | Status: AC
  Administered 2022-08-08: 80 mg via INTRAVENOUS
  Filled 2022-08-08: qty 8

## 2022-08-08 MED ORDER — ASPIRIN 81 MG PO TBEC
81.0000 mg | DELAYED_RELEASE_TABLET | Freq: Every day | ORAL | Status: DC
  Administered 2022-08-08 – 2022-08-13 (×6): 81 mg via ORAL
  Filled 2022-08-08 (×6): qty 1

## 2022-08-08 MED ORDER — ACETAMINOPHEN 325 MG PO TABS
650.0000 mg | ORAL_TABLET | ORAL | Status: DC | PRN
  Administered 2022-08-10 – 2022-08-12 (×3): 650 mg via ORAL
  Filled 2022-08-08 (×3): qty 2

## 2022-08-08 MED ORDER — FUROSEMIDE 10 MG/ML IJ SOLN
80.0000 mg | Freq: Two times a day (BID) | INTRAMUSCULAR | Status: DC
  Administered 2022-08-09: 80 mg via INTRAVENOUS
  Filled 2022-08-08: qty 8

## 2022-08-08 MED ORDER — ONDANSETRON HCL 4 MG/2ML IJ SOLN: 4.0000 mg | Freq: Four times a day (QID) | INTRAMUSCULAR | Status: DC | PRN

## 2022-08-08 NOTE — Assessment & Plan Note (Signed)
-   Hold home losartan in the setting of AKI

## 2022-08-08 NOTE — ED Provider Notes (Signed)
[facility] Provider Note    Event Date/Time   First MD Initiated Contact with Patient 08/08/22 1539     (approximate)   History   Pleural Effusion   HPI  Rick Zuniga is a 76 y.o. male  with a history of CHF, CKD, COPD, diabetes, hypertension, AICD and thrombocytopenia who presents with increased shortness of breath and leg swelling and was referred for admission for thoracentesis.  The patient denies any fever or chills, vomiting or diarrhea, or other acute symptoms.  I reviewed the past medical records.  The patient had an outpatient left thoracentesis on 12/1.  He had a telephone encounter with Dr. Lanney Gins today and has recently been on increased diuretics but has been more short of breath.  He was recommended to come to the hospital.   Physical Exam   Triage Vital Signs: ED Triage Vitals [08/08/22 1125]  Enc Vitals Group     BP 135/79     Pulse Rate 72     Resp 18     Temp 98.1 F (36.7 C)     Temp Source Oral     SpO2 100 %     Weight      Height      Head Circumference      Peak Flow      Pain Score      Pain Loc      Pain Edu?      Excl. in [place]?     Most recent vital signs: Vitals:   08/08/22 2200 08/08/22 2233  BP: (!) 143/91   Pulse: 70   Resp: 15   Temp:  98.3 F (36.8 C)  SpO2: 100%      General: Awake, no distress.  CV:  Good peripheral perfusion.  Resp:  Normal effort.  Diminished breath sounds bilateral lung bases. Abd:  No distention.  Other:  2+ bilateral lower extremity edema.   ED Results / Procedures / Treatments   Labs (all labs ordered are listed, but only abnormal results are displayed) Labs Reviewed  BASIC METABOLIC PANEL - Abnormal; Notable for the following components:      Result Value   Potassium 5.2 (*)    CO2 21 (*)    Glucose, Bld 133 (*)    BUN 87 (*)    Creatinine, Ser 3.24 (*)    Calcium 8.8 (*)    GFR, Estimated 19 (*)    All other components within normal limits  CBC -  Abnormal; Notable for the following components:   RBC 2.68 (*)    Hemoglobin 9.0 (*)    HCT 28.8 (*)    MCV 107.5 (*)    RDW 15.6 (*)    Platelets 105 (*)    All other components within normal limits  BRAIN NATRIURETIC PEPTIDE - Abnormal; Notable for the following components:   B Natriuretic Peptide 3,152.8 (*)    All other components within normal limits  CBG MONITORING, ED - Abnormal; Notable for the following components:   Glucose-Capillary 152 (*)    All other components within normal limits  TROPONIN I (HIGH SENSITIVITY) - Abnormal; Notable for the following components:   Troponin I (High Sensitivity) 44 (*)    All other components within normal limits  TROPONIN I (HIGH SENSITIVITY) - Abnormal; Notable for the following components:   Troponin I (High Sensitivity) 47 (*)    All other components within normal limits  COMPREHENSIVE METABOLIC PANEL  CBC WITH DIFFERENTIAL/PLATELET  MAGNESIUM     EKG  ED ECG REPORT I, Arta Silence, the attending physician, personally viewed and interpreted this ECG.  Date: 08/08/2022 EKG Time: 1132 Rate: 72 Rhythm: Atrial-sensed ventricular-paced rhythm QRS Axis: normal Intervals: normal ST/T Wave abnormalities: normal Narrative Interpretation: no evidence of acute ischemia    RADIOLOGY  Chest x-ray: I independently viewed and interpreted the images; there are bilateral small pleural effusions   PROCEDURES:  Critical Care performed: No  Procedures   MEDICATIONS ORDERED IN ED: Medications  sodium chloride flush (NS) 0.9 % injection 3 mL (3 mLs Intravenous Given 08/08/22 2212)  sodium chloride flush (NS) 0.9 % injection 3 mL (has no administration in time range)  0.9 %  sodium chloride infusion (has no administration in time range)  acetaminophen (TYLENOL) tablet 650 mg (has no administration in time range)  ondansetron (ZOFRAN) injection 4 mg (has no administration in time range)  heparin injection 5,000 Units (5,000  Units Subcutaneous Given 08/08/22 2212)  furosemide (LASIX) injection 80 mg (has no administration in time range)  aspirin EC tablet 81 mg (81 mg Oral Given 08/08/22 2027)  insulin aspart (novoLOG) injection 0-15 Units (has no administration in time range)  furosemide (LASIX) injection 80 mg (80 mg Intravenous Given 08/08/22 1637)     IMPRESSION / MDM / ASSESSMENT AND PLAN / ED COURSE  I reviewed the triage vital signs and the nursing notes.  76 year old male with PMH as noted above presents with worsening respiratory symptoms and edema despite p.o. diuretics.  He was referred by his pulmonologist and PMD for diuresis with IV medications and possible thoracentesis.  Differential diagnosis includes, but is not limited to, CHF, fluid overload due to renal insufficiency or other etiology.  Patient's presentation is most consistent with acute presentation with potential threat to life or bodily function.  I reached out to Dr. Lanney Gins tp discuss the plan.  I will give IV Lasix.  The patient has a listed allergy or intolerance to Lasix, however reviewing past records he has received IV Lasix multiple times previously.  I have ordered a dose of Lasix IV.  ----------------------------------------- 5:24 PM on 08/08/2022 -----------------------------------------  Lab workup revealed significantly elevated BNP, minimally elevated troponin, and creatinine somewhat elevated from baseline.  I consulted Dr. Charleen Kirks for admission; based on her discussion she agrees to admit the patient.  FINAL CLINICAL IMPRESSION(S) / ED DIAGNOSES   Final diagnoses:  Shortness of breath  Hypervolemia, unspecified hypervolemia type  Pleural effusion     Rx / DC Orders   ED Discharge Orders     None        Note:  This document was prepared using Dragon voice recognition software and may include unintentional dictation errors.    Arta Silence, MD 08/08/22 2356

## 2022-08-08 NOTE — Assessment & Plan Note (Signed)
No evidence of acute EKG changes and patient is chest pain-free.  Troponin leak is likely in the setting of CKD. -Continue home regimen aspirin. -ARB on hold due to AKI and soft blood pressure. -Per cardiology.

## 2022-08-08 NOTE — Assessment & Plan Note (Signed)
Patient presenting with worsening shortness of breath and lower extremity edema extending up to the sacrum at this point despite home torsemide being increased from 40 mg daily to 100 mg daily.  BNP markedly elevated at 3000.  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  This was prior to CRT-D placement.  No repeat echocardiogram since then.  - Consulted cardiology; appreciate their recommendations - Start Lasix 80 mg twice daily - Strict in and out - Daily weights - Repeat echocardiogram to reassess EF

## 2022-08-08 NOTE — Assessment & Plan Note (Addendum)
Patient stated he was recommended to be evaluated in the ED by his pulmonologist due to shortness of breath with possible recurrence of pleural effusion.  At that time, they discussed Pleurx catheter, which he is declining at this time.  -CXR x-ray relatively unchanged from 3 weeks ago. -Continue diuresis as above for acute CHF exacerbation. -CT chest done with concerns for loculated pleural effusion. -Primary pulmonologist consulted to assess the patient and following.   -It is noted per primary pulmonologist that patient has had recurrent bilateral effusions worse on the left, numerous effusions drained via thoracentesis patient noted to have declined Pleurx catheter and pulmonary feels patient may be approaching need for HD.  Pulmonary consulted with nephrology who has assessed the patient and at this time does not feel any need for HD as patient responding well to Lasix drip. -No plans for intervention at this time per pulmonary.   -Pulmonary recommending on discharge spironolactone 50 mg daily and torsemide 40 mg daily. -Pulmonary following.  -Will need outpatient follow-up with pulmonary.

## 2022-08-08 NOTE — Assessment & Plan Note (Signed)
Creatinine on presentation elevated at 3.24 compared to baseline between 2.2 and 2.5.  Potassium elevated at 5.2 despite being on high-dose diuretics.  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  He states that he follows with a nephrologist but he has never been told that his kidney function had declined so much.  - Diuresis as noted above - Strict in and outs - Repeat BMP in the a.m. - Avoid nephrotoxic agents as able, including home spironolactone and losartan

## 2022-08-08 NOTE — Assessment & Plan Note (Signed)
-   SSI, moderate 

## 2022-08-08 NOTE — H&P (Signed)
History and Physical    Patient: Rick Zuniga OZH:[id number] DOB: [birth year] DOA: 08/08/2022 DOS: the patient was seen and examined on 08/08/2022 PCP: [name], MD  Patient coming from: Home  Chief Complaint:  Chief Complaint  Patient presents with   Pleural Effusion   HPI: Rick Zuniga is a 76 y.o. 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  Rick Zuniga states that for the past 4 days, he has been taking the Increased dose of torsemide and spironolactone as prescribed by his pulmonologist.  Despite this, he has been urinating less often and feels that his fluid levels have not improved at all if not increased.  He endorses shortness of breath and lower extremity edema that extends up into his groin.  He any chest pain or palpitations.  Per chart review, patient had a recent visit with his pulmonologist, Dr. Lanney Gins, at which time palliative Pleurx catheter was discussed.  In addition, he was started on spironolactone and his torsemide dose was increased from 40 mg to 100 mg.  Per note, patient at that time agreed to Pleurx.  I discussed this plan with Rick Zuniga and he states that he has spoken to his other physicians, who have recommended he not have that done.  ED course: On arrival to the ED, patient was normotensive at 135/79 with heart rate of 72.  He was saturating at 100% on room air.  Initial workup remarkable for potassium of 5.2, bicarb of 21, creatinine of 3.24, with GFR 19.  Hemoglobin is 9.0 with platelets of 105.  Troponin is elevated at 44 with flat trend to 47.  EKG with atrial sensed ventricular  pacing with multiple PVCs.  No ST or T wave changes concerning for acute ischemia.  Chest x-ray with bilateral pleural effusions, small, and hyperexpansion with persistent retrocardiac collapse/consolidation.  Due to acute on chronic heart failure, patient was started on Lasix and TRH contacted for admission.  Review of Systems: As mentioned in the history of present illness. All other systems reviewed and are negative.  Past Medical History:  Diagnosis Date   AICD (automatic cardioverter/defibrillator) present    Arthritis    Cataract    Mixed OU   CHF (congestive heart failure) ([place])    Chronic kidney disease    Complication of anesthesia    Difficulty inserting IV, wants expert at IV's, no multiple attempts   COPD (chronic obstructive pulmonary disease) ([place])    COVID-19 05/23/2022   Diabetes ([place])    Type 2   Diabetic retinopathy (HCC)    NPDR OU   Heart disease    Hypertension    Hypertensive retinopathy    OU   Indigestion    Presence of permanent cardiac pacemaker    Recurrent pleural effusion on left    Thrombocytopenia (HCC)    Wears dentures    full upper and lower   Past Surgical History:  Procedure Laterality Date   ANTERIOR VITRECTOMY Right 07/02/2022   Procedure: ANTERIOR VITRECTOMY;  Surgeon: Leandrew Koyanagi, MD;  Location: [facility];  Service: Ophthalmology;  Laterality: Right;   APPENDECTOMY     BI-VENTRICULAR IMPLANTABLE CARDIOVERTER DEFIBRILLATOR  (CRT-D)  05/20/2022   CARDIAC CATHETERIZATION  Right 04/16/2022   CARDIAC SURGERY  12/21/2009   bypass. 4 vessel   CATARACT EXTRACTION W/PHACO Left 08/07/2021   Procedure: CATARACT EXTRACTION PHACO AND INTRAOCULAR LENS PLACEMENT (IOC) LEFT DIABETIC 9.06 01:42.2;  Surgeon: Leandrew Koyanagi, MD;  Location: [facility];  Service: Ophthalmology;  Laterality: Left;  Diabetic   CATARACT EXTRACTION W/PHACO Right 07/02/2022   Procedure: CATARACT EXTRACTION PHACO AND INTRAOCULAR LENS PLACEMENT  (IOC) RIGHT VISION BLUE HEALON 5 DIABETIC  10.62  01:16.7;  Surgeon: Leandrew Koyanagi, MD;  Location: [facility];  Service: Ophthalmology;  Laterality: Right;   CATARACT EXTRACTION W/PHACO Right 07/16/2022   Procedure: REMOVAL OF LENS FRAMENTS RIGHT DIABETIC;  Surgeon: Leandrew Koyanagi, MD;  Location: [facility];  Service: Ophthalmology;  Laterality: Right;  patient wants last   CHOLECYSTECTOMY     COLONOSCOPY  12/23/2010   IR THORACENTESIS ASP PLEURAL SPACE W/IMG GUIDE  07/18/2022   THORACENTESIS     02/25/21, 10/17/21   Social History:  reports that he has never smoked. He has never used smokeless tobacco. He reports that he does not drink alcohol and does not use drugs.  Allergies  Allergen Reactions   Pioglitazone Swelling   Furosemide Other (See Comments)    Pt may have had swelling in his face per spouse.  Drug discontinued     Metformin And Related Diarrhea   Atorvastatin Rash   Benazepril Rash   Latex Rash    (tape only)   Other Rash    Pt reported that he is allergic to Ace wrap.  Reaction include severe skin irritation.   Tape Rash    Family History  Problem Relation Age of Onset   CAD Mother    Kidney failure Father    Diabetes Father     Prior to Admission medications   Medication Sig Start Date End Date Taking? Authorizing Provider  aspirin EC 81 MG tablet Take 81 mg by mouth daily.    [provider]  Continuous Blood Gluc Receiver (FREESTYLE LIBRE 14 DAY READER) DEVI Use 1 Device as directed Dx: E11.29 02/22/20   [provider]  Continuous Blood Gluc Sensor (FREESTYLE LIBRE 14 DAY SENSOR) MISC SMARTSIG:1 Kit(s) Topical Every 2 Weeks 10/29/20   [provider]  glipiZIDE (GLUCOTROL) 10 MG tablet Take 10 mg by mouth 2 (two) times daily. 5 mg in the morning    [provider]  insulin degludec (TRESIBA) 100 UNIT/ML FlexTouch Pen Inject 10 Units into the skin daily.    [provider]  losartan  (COZAAR) 25 MG tablet Take 25 mg by mouth daily.    [provider]  meclizine (ANTIVERT) 25 MG tablet Take 1 tablet (25 mg total) by mouth 3 (three) times daily as needed for dizziness. 03/02/20   Sharen Hones, MD  nitroGLYCERIN (NITROSTAT) 0.4 MG SL tablet Place 0.4 mg under the tongue every 5 (five) minutes as needed for chest pain.    [provider]  tamsulosin (FLOMAX) 0.4 MG CAPS capsule Take 0.4 mg by mouth daily. Patient not taking: Reported on 07/14/2022    [provider]  torsemide (DEMADEX) 20 MG tablet Take 20 mg by mouth 2 (two) times daily.  12/05/19   [provider]    Physical Exam: Vitals:   08/08/22 1125 08/08/22 1630  BP: 135/79 (!) 145/70  Pulse: 72 90  Resp: 18 18  Temp: 98.1 F (36.7 C) 98.2 F (36.8 C)  TempSrc: Oral Oral  SpO2: 100% 94%  Physical Exam Vitals and nursing note reviewed.  Constitutional:      General: He is not in acute distress.    Appearance: He is normal weight. He is not toxic-appearing.  HENT:     Head: Normocephalic and atraumatic.     Mouth/Throat:     Mouth: Mucous membranes are moist.     Pharynx: Oropharynx is clear.  Eyes:     Extraocular Movements: Extraocular movements intact.     Conjunctiva/sclera: Conjunctivae normal.     Comments: Right pupil larger than left, recent ocular procedure.  Neck:     Vascular: JVD present.  Cardiovascular:     Rate and Rhythm: Normal rate and regular rhythm.     Heart sounds: No murmur heard.    No gallop.     Comments: Marked bilateral pitting edema extending up to the sacral region. Pulmonary:     Effort: Pulmonary effort is normal.     Breath sounds: Normal breath sounds. No decreased breath sounds, wheezing, rhonchi or rales.  Abdominal:     General: Bowel sounds are normal. There is no distension.     Palpations: Abdomen is soft.     Tenderness: There is no abdominal tenderness.  Musculoskeletal:     Right lower leg: 3+ Pitting Edema present.      Left lower leg: 3+ Pitting Edema present.  Skin:    General: Skin is warm and dry.  Neurological:     General: No focal deficit present.     Mental Status: He is alert and oriented to person, place, and time. Mental status is at baseline.     Motor: No weakness.     Gait: Gait normal.  Psychiatric:        Mood and Affect: Mood normal.        Behavior: Behavior normal.    Data Reviewed: CBC with WBC of 4.4, hemoglobin of 9.0, MCV of 107, platelets of 105. BMP with potassium of 5.2, bicarb of 21, glucose of 133, BUN of 87, creatinine of 3.24, GFR of 19. Troponin elevated at 44 with flat trend to 47  EKG personally reviewed.  Atrial sensing with ventricular pacing.  Multiple PVCs noted.    DG Chest 2 View  Result Date: 08/08/2022 CLINICAL DATA:  Shortness of breath. EXAM: CHEST - 2 VIEW COMPARISON:  07/18/2022 FINDINGS: Lungs are hyperexpanded. The cardio pericardial silhouette is enlarged. Retrocardiac collapse/consolidation is similar to prior. Tiny bilateral pleural effusions evident. Bones are diffusely demineralized. Left-sided permanent pacemaker/AICD again noted. IMPRESSION: 1. Hyperexpansion with persistent retrocardiac collapse/consolidation. 2. Tiny bilateral pleural effusions. Electronically Signed   By: Misty Stanley M.D.   On: 08/08/2022 12:06    Results are pending, will review when available.  Assessment and Plan: * Acute on chronic HFrEF (heart failure with reduced ejection fraction) ([place]) Patient presenting with worsening shortness of breath and lower extremity edema extending up to the sacrum at this point despite home torsemide being increased from 40 mg daily to 100 mg daily.  BNP markedly elevated at 3000.  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  This was prior to CRT-D placement.  No repeat echocardiogram since then.  - Consulted cardiology; appreciate their recommendations - Start Lasix 80  mg twice daily - Strict in and out - Daily weights - Repeat echocardiogram to reassess EF  Pleural effusion on left Patient stated he was recommended to be evaluated in the ED by his pulmonologist due to  shortness of breath with possible recurrence of pleural effusion.  At that time, they discussed Pleurx catheter, which he is declining at this time.  Chest x-ray today relatively unchanged from 3 weeks ago.  - Pulmonology consulted; appreciate their recommendations - CT chest for better visualization of effusions - Diuresis as noted above  Acute kidney injury superimposed on CKD ([place]) Creatinine on presentation elevated at 3.24 compared to baseline between 2.2 and 2.5.  Potassium elevated at 5.2 despite being on high-dose diuretics.  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  He states that he follows with a nephrologist but he has never been told that his kidney function had declined so much.  - Diuresis as noted above - Strict in and outs - Repeat BMP in the a.m. - Avoid nephrotoxic agents as able, including home spironolactone and losartan  Goals of care, counseling/discussion When approaching the topic of goals of care and CODE STATUS, Mr. Tyrone Apple and his significant other Ms. Charlynn Grimes were quite shocked and surprised to hear my concerns regarding his severe biventricular heart failure that seems to be unresponsive to CRT-D, history of CKD stage IV and recurrent pleural effusions that have required numerous thoracentesis.  They had not considered the topic of palliative care and did not realize that the Pleurx was palliative in nature.  Patient and his significant other seem to have a poor understanding of the severity of patient's chronic multiorgan failure.  They would like to think before a Palliative consult is placed.  - Strongly recommend palliative care consultation  Accelerated hypertension - Hold home losartan in  the setting of AKI  Type 2 diabetes mellitus with microalbuminuria, with long-term current use of insulin (HCC) - SSI, moderate  Thrombocytopenia (HCC) Chronic and stable at this time.  CAD (coronary artery disease) No evidence of acute EKG changes and patient is chest pain-free.  Troponin leak is likely in the setting of CKD.  -Continue home aspirin  Advance Care Planning:   Code Status: Full Code.  Verified with patient.  Please see goals of care assessment and plan for further details.  Consults: Pulmonology, cardiology  Family Communication: Patient's significant other updated at bedside  Severity of Illness: The appropriate patient status for this patient is OBSERVATION. Observation status is judged to be reasonable and necessary in order to provide the required intensity of service to ensure the patient's safety. The patient's presenting symptoms, physical exam findings, and initial radiographic and laboratory data in the context of their medical condition is felt to place them at decreased risk for further clinical deterioration. Furthermore, it is anticipated that the patient will be medically stable for discharge from the hospital within 2 midnights of admission.   Author: Jose Persia, MD 08/08/2022 7:27 PM  For on call review [email or web address].

## 2022-08-08 NOTE — Assessment & Plan Note (Signed)
Chronic. -Platelet count slowly trending down, monitor closely. -Patient denies any bleeding.

## 2022-08-08 NOTE — ED Triage Notes (Signed)
Pt to ED via POV from [facility]. This RN received report from Dr. Burt Ek at [facility]. Pt sent due to recurrent pleural effusions that have gotten worse. Dr. Burt Ek has also been in contact with Dr. Lanney Gins for pt to be direct admit for thoracentesis and dialysis.

## 2022-08-08 NOTE — Assessment & Plan Note (Signed)
When approaching the topic of goals of care and CODE STATUS, [name] and his significant other Ms. Charlynn Grimes were quite shocked and surprised to Dr. Charleen Kirks (admitting physician's) concerns regarding his severe biventricular heart failure that seems to be unresponsive to CRT-D, history of CKD stage IV and recurrent pleural effusions that have required numerous thoracentesis.  They had not considered the topic of palliative care and did not realize that the Pleurx was palliative in nature.   -Patient and his significant other seem to have a poor understanding of the severity of patient's chronic multiorgan failure.  They would like to think before a Palliative consult is placed. -Revisited with patient and significant other about palliative care and patient and significant other at this time want to hold off on palliative care involvement at this time. -In my view patient and significant other seem to also have a poor understanding of severity of patient's chronic multiorgan failure. -Cardiology, pulmonary nephrology consulted and are following and hopefully they can shed further light into this for patient and his loved one.  -Will hold off on any palliative care consultation at this time per patient and significant other request. -Outpatient follow-up with PCP as well.

## 2022-08-09 DIAGNOSIS — I130 Hypertensive heart and chronic kidney disease with heart failure and stage 1 through stage 4 chronic kidney disease, or unspecified chronic kidney disease: Secondary | ICD-10-CM | POA: Diagnosis present

## 2022-08-09 DIAGNOSIS — I2510 Atherosclerotic heart disease of native coronary artery without angina pectoris: Secondary | ICD-10-CM | POA: Diagnosis present

## 2022-08-09 DIAGNOSIS — E1122 Type 2 diabetes mellitus with diabetic chronic kidney disease: Secondary | ICD-10-CM | POA: Diagnosis present

## 2022-08-09 DIAGNOSIS — Z951 Presence of aortocoronary bypass graft: Secondary | ICD-10-CM | POA: Diagnosis not present

## 2022-08-09 DIAGNOSIS — Z8616 Personal history of COVID-19: Secondary | ICD-10-CM | POA: Diagnosis not present

## 2022-08-09 DIAGNOSIS — D696 Thrombocytopenia, unspecified: Secondary | ICD-10-CM | POA: Diagnosis present

## 2022-08-09 DIAGNOSIS — I428 Other cardiomyopathies: Secondary | ICD-10-CM | POA: Diagnosis present

## 2022-08-09 DIAGNOSIS — J449 Chronic obstructive pulmonary disease, unspecified: Secondary | ICD-10-CM | POA: Diagnosis present

## 2022-08-09 DIAGNOSIS — I25118 Atherosclerotic heart disease of native coronary artery with other forms of angina pectoris: Secondary | ICD-10-CM

## 2022-08-09 DIAGNOSIS — N179 Acute kidney failure, unspecified: Secondary | ICD-10-CM | POA: Diagnosis present

## 2022-08-09 DIAGNOSIS — D61818 Other pancytopenia: Secondary | ICD-10-CM | POA: Diagnosis present

## 2022-08-09 DIAGNOSIS — I5023 Acute on chronic systolic (congestive) heart failure: Secondary | ICD-10-CM | POA: Diagnosis not present

## 2022-08-09 DIAGNOSIS — E113293 Type 2 diabetes mellitus with mild nonproliferative diabetic retinopathy without macular edema, bilateral: Secondary | ICD-10-CM | POA: Diagnosis present

## 2022-08-09 DIAGNOSIS — Z79899 Other long term (current) drug therapy: Secondary | ICD-10-CM | POA: Diagnosis not present

## 2022-08-09 DIAGNOSIS — Z95810 Presence of automatic (implantable) cardiac defibrillator: Secondary | ICD-10-CM | POA: Diagnosis not present

## 2022-08-09 DIAGNOSIS — I10 Essential (primary) hypertension: Secondary | ICD-10-CM

## 2022-08-09 DIAGNOSIS — I493 Ventricular premature depolarization: Secondary | ICD-10-CM | POA: Diagnosis present

## 2022-08-09 DIAGNOSIS — I340 Nonrheumatic mitral (valve) insufficiency: Secondary | ICD-10-CM | POA: Diagnosis present

## 2022-08-09 DIAGNOSIS — D631 Anemia in chronic kidney disease: Secondary | ICD-10-CM | POA: Diagnosis present

## 2022-08-09 DIAGNOSIS — E875 Hyperkalemia: Secondary | ICD-10-CM | POA: Diagnosis present

## 2022-08-09 DIAGNOSIS — R0602 Shortness of breath: Secondary | ICD-10-CM | POA: Diagnosis present

## 2022-08-09 DIAGNOSIS — I2722 Pulmonary hypertension due to left heart disease: Secondary | ICD-10-CM | POA: Diagnosis present

## 2022-08-09 DIAGNOSIS — I509 Heart failure, unspecified: Secondary | ICD-10-CM

## 2022-08-09 DIAGNOSIS — J9621 Acute and chronic respiratory failure with hypoxia: Secondary | ICD-10-CM | POA: Diagnosis present

## 2022-08-09 DIAGNOSIS — Z794 Long term (current) use of insulin: Secondary | ICD-10-CM

## 2022-08-09 DIAGNOSIS — E1129 Type 2 diabetes mellitus with other diabetic kidney complication: Secondary | ICD-10-CM

## 2022-08-09 DIAGNOSIS — J90 Pleural effusion, not elsewhere classified: Secondary | ICD-10-CM | POA: Diagnosis not present

## 2022-08-09 DIAGNOSIS — E8770 Fluid overload, unspecified: Secondary | ICD-10-CM | POA: Diagnosis not present

## 2022-08-09 DIAGNOSIS — R809 Proteinuria, unspecified: Secondary | ICD-10-CM

## 2022-08-09 DIAGNOSIS — I5082 Biventricular heart failure: Secondary | ICD-10-CM | POA: Diagnosis present

## 2022-08-09 DIAGNOSIS — K5900 Constipation, unspecified: Secondary | ICD-10-CM | POA: Diagnosis present

## 2022-08-09 DIAGNOSIS — N184 Chronic kidney disease, stage 4 (severe): Secondary | ICD-10-CM | POA: Diagnosis present

## 2022-08-09 DIAGNOSIS — I5043 Acute on chronic combined systolic (congestive) and diastolic (congestive) heart failure: Secondary | ICD-10-CM | POA: Diagnosis present

## 2022-08-09 LAB — COMPREHENSIVE METABOLIC PANEL
ALT: 18 U/L (ref 0–44)
AST: 16 U/L (ref 15–41)
Albumin: 3.4 g/dL — ABNORMAL LOW (ref 3.5–5.0)
Alkaline Phosphatase: 80 U/L (ref 38–126)
Anion gap: 7 (ref 5–15)
BUN: 85 mg/dL — ABNORMAL HIGH (ref 8–23)
CO2: 20 mmol/L — ABNORMAL LOW (ref 22–32)
Calcium: 8.5 mg/dL — ABNORMAL LOW (ref 8.9–10.3)
Chloride: 114 mmol/L — ABNORMAL HIGH (ref 98–111)
Creatinine, Ser: 2.81 mg/dL — ABNORMAL HIGH (ref 0.61–1.24)
GFR, Estimated: 22 mL/min — ABNORMAL LOW (ref >60)
Glucose, Bld: 101 mg/dL — ABNORMAL HIGH (ref 70–99)
Potassium: 4.7 mmol/L (ref 3.5–5.1)
Sodium: 141 mmol/L (ref 135–145)
Total Bilirubin: 1.6 mg/dL — ABNORMAL HIGH (ref 0.3–1.2)
Total Protein: 6 g/dL — ABNORMAL LOW (ref 6.5–8.1)

## 2022-08-09 LAB — CBC WITH DIFFERENTIAL/PLATELET
Abs Immature Granulocytes: 0.01 10*3/uL (ref 0.00–0.07)
Basophils Absolute: 0 10*3/uL (ref 0.0–0.1)
Basophils Relative: 1 %
Eosinophils Absolute: 0.1 10*3/uL (ref 0.0–0.5)
Eosinophils Relative: 3 %
HCT: 25.8 % — ABNORMAL LOW (ref 39.0–52.0)
Hemoglobin: 8.3 g/dL — ABNORMAL LOW (ref 13.0–17.0)
Immature Granulocytes: 0 %
Lymphocytes Relative: 20 %
Lymphs Abs: 0.6 10*3/uL — ABNORMAL LOW (ref 0.7–4.0)
MCH: 34.3 pg — ABNORMAL HIGH (ref 26.0–34.0)
MCHC: 32.2 g/dL (ref 30.0–36.0)
MCV: 106.6 fL — ABNORMAL HIGH (ref 80.0–100.0)
Monocytes Absolute: 0.2 10*3/uL (ref 0.1–1.0)
Monocytes Relative: 8 %
Neutro Abs: 2.1 10*3/uL (ref 1.7–7.7)
Neutrophils Relative %: 68 %
Platelets: 83 10*3/uL — ABNORMAL LOW (ref 150–400)
RBC: 2.42 MIL/uL — ABNORMAL LOW (ref 4.22–5.81)
RDW: 15.1 % (ref 11.5–15.5)
WBC: 3.1 10*3/uL — ABNORMAL LOW (ref 4.0–10.5)
nRBC: 0 % (ref 0.0–0.2)

## 2022-08-09 LAB — CBG MONITORING, ED
Glucose-Capillary: 101 mg/dL — ABNORMAL HIGH (ref 70–99)
Glucose-Capillary: 138 mg/dL — ABNORMAL HIGH (ref 70–99)
Glucose-Capillary: 191 mg/dL — ABNORMAL HIGH (ref 70–99)
Glucose-Capillary: 105 mg/dL — ABNORMAL HIGH (ref 70–99)
Glucose-Capillary: 212 mg/dL — ABNORMAL HIGH (ref 70–99)

## 2022-08-09 LAB — MAGNESIUM: Magnesium: 2.3 mg/dL (ref 1.7–2.4)

## 2022-08-09 MED ORDER — FUROSEMIDE 10 MG/ML IJ SOLN
4.0000 mg/h | INTRAVENOUS | Status: DC
  Administered 2022-08-09 – 2022-08-11 (×2): 4 mg/h via INTRAVENOUS
  Filled 2022-08-09 (×3): qty 20

## 2022-08-09 NOTE — ED Notes (Addendum)
Sharion Settler, NP notified of patient Rick Zuniga. No new orders received

## 2022-08-09 NOTE — ED Notes (Signed)
Report received from [place], RN  

## 2022-08-09 NOTE — Hospital Course (Signed)
HPI per Dr. Marcelyn Bruins is a 76 y.o. 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   Rick Zuniga states that for the past 4 days, he has been taking the Increased dose of torsemide and spironolactone as prescribed by his pulmonologist.  Despite this, he has been urinating less often and feels that his fluid levels have not improved at all if not increased.  He endorses shortness of breath and lower extremity edema that extends up into his groin.  He any chest pain or palpitations.   Per chart review, patient had a recent visit with his pulmonologist, Dr. Lanney Gins, at which time palliative Pleurx catheter was discussed.  In addition, he was started on spironolactone and his torsemide dose was increased from 40 mg to 100 mg.  Per note, patient at that time agreed to Pleurx.  I discussed this plan with Rick Zuniga and he states that he has spoken to his other physicians, who have recommended he not have that done.   ED course: On arrival to the ED, patient was normotensive at 135/79 with heart rate of 72.  He was saturating at 100% on room air.  Initial workup remarkable for potassium of 5.2, bicarb of 21, creatinine of 3.24, with GFR 19.  Hemoglobin is 9.0 with platelets of 105.  Troponin is elevated at 44 with flat trend to 47.  EKG with atrial sensed ventricular pacing with multiple PVCs.  No ST or T wave changes concerning for acute ischemia.  Chest x-ray with bilateral pleural effusions, small, and hyperexpansion with persistent retrocardiac collapse/consolidation.  Due to acute on chronic heart failure, patient was started on Lasix and  TRH contacted for admission.

## 2022-08-09 NOTE — Consult Note (Signed)
Rick Zuniga is a 76 y.o. male  [id number]  Primary Cardiologist: Dulce Sellar Reason for Consultation: Congestive heart failure  HPI: This is a 76 year old white male with past medical history of HFrEF with severe LV dysfunction with left ventricular ejection fraction 15%, renal failure history of CABG 2011 status post defibrillator implantation with CRT.  Patient presented with shortness of breath orthopnea PND and leg swelling.   Review of Systems: No chest pain   Past Medical History:  Diagnosis Date   AICD (automatic cardioverter/defibrillator) present    Arthritis    Cataract    Mixed OU   CHF (congestive heart failure) (HCC)    Chronic kidney disease    Complication of anesthesia    Difficulty inserting IV, wants expert at IV's, no multiple attempts   COPD (chronic obstructive pulmonary disease) ([place])    COVID-19 05/23/2022   Diabetes ([place])    Type 2   Diabetic retinopathy (HCC)    NPDR OU   Heart disease    Hypertension    Hypertensive retinopathy    OU   Indigestion    Presence of permanent cardiac pacemaker    Recurrent pleural effusion on left    Thrombocytopenia (HCC)    Wears dentures    full upper and lower    (Not in a hospital admission)     aspirin EC  81 mg Oral Daily   furosemide  80 mg Intravenous BID   heparin  5,000 Units Subcutaneous Q8H   insulin aspart  0-15 Units Subcutaneous TID WC   sodium chloride flush  3 mL Intravenous Q12H    Infusions:  sodium chloride      Allergies  Allergen Reactions   Pioglitazone Swelling   Furosemide Other (See Comments)    Pt may have had swelling in his face per spouse.  Drug discontinued     Metformin And Related Diarrhea   Prednisone     Other Reaction(s): Other (See Comments)  Face swells up and severe bleeding   Atorvastatin Rash   Benazepril Rash   Latex Rash    (tape only)   Other Rash    Pt reported that he is allergic to Ace wrap.  Reaction include severe skin  irritation.   Tape Rash    Social History   Socioeconomic History   Marital status: Single    Spouse name: Not on file   Number of children: Not on file   Years of education: Not on file   Highest education level: Not on file  Occupational History   Not on file  Tobacco Use   Smoking status: Never   Smokeless tobacco: Never  Vaping Use   Vaping Use: Never used  Substance and Sexual Activity   Alcohol use: No   Drug use: No   Sexual activity: Not on file  Other Topics Concern   Not on file  Social History Narrative   Not on file   Social Determinants of Health   Financial Resource Strain: Not on file  Food Insecurity: Not on file  Transportation Needs: Not on file  Physical Activity: Not on file  Stress: Not on file  Social Connections: Not on file  Intimate Partner Violence: Not on file    Family History  Problem Relation Age of Onset   CAD Mother    Kidney failure Father    Diabetes Father     PHYSICAL EXAM: Vitals:   08/09/22 0847 08/09/22 0900  BP: 120/64 120/63  Pulse: 62 (!) 59  Resp: 19 16  Temp: 98.3 F (36.8 C)   SpO2: 100% 97%     Intake/Output Summary (Last 24 hours) at 08/09/2022 1327 Last data filed at 08/09/2022 0160 Gross per 24 hour  Intake --  Output 1000 ml  Net -1000 ml    General:  Well appearing. No respiratory difficulty HEENT: normal Neck: supple. no JVD. Carotids 2+ bilat; no bruits. No lymphadenopathy or thryomegaly appreciated. Cor: PMI nondisplaced. Regular rate & rhythm. No rubs, gallops or murmurs. Lungs: clear Abdomen: soft, nontender, nondistended. No hepatosplenomegaly. No bruits or masses. Good bowel sounds. Extremities: no cyanosis, clubbing, rash, edema Neuro: alert & oriented x 3, cranial nerves grossly intact. moves all 4 extremities w/o difficulty. Affect pleasant.  ECG: Atrial sensed ventricular paced rhythm with with frequent PVCs  Results for orders placed or performed during the hospital encounter of  08/08/22 (from the past 24 hour(s))  Troponin I (High Sensitivity)     Status: Abnormal   Collection Time: 08/08/22  4:16 PM  Result Value Ref Range   Troponin I (High Sensitivity) 47 (H) <18 ng/L  Brain natriuretic peptide     Status: Abnormal   Collection Time: 08/08/22  5:18 PM  Result Value Ref Range   B Natriuretic Peptide 3,152.8 (H) 0.0 - 100.0 pg/mL  CBG monitoring, ED     Status: Abnormal   Collection Time: 08/08/22  8:35 PM  Result Value Ref Range   Glucose-Capillary 152 (H) 70 - 99 mg/dL  CBG monitoring, ED     Status: Abnormal   Collection Time: 08/09/22  6:00 AM  Result Value Ref Range   Glucose-Capillary 101 (H) 70 - 99 mg/dL  Comprehensive metabolic panel     Status: Abnormal   Collection Time: 08/09/22  6:03 AM  Result Value Ref Range   Sodium 141 135 - 145 mmol/L   Potassium 4.7 3.5 - 5.1 mmol/L   Chloride 114 (H) 98 - 111 mmol/L   CO2 20 (L) 22 - 32 mmol/L   Glucose, Bld 101 (H) 70 - 99 mg/dL   BUN 85 (H) 8 - 23 mg/dL   Creatinine, Ser 2.81 (H) 0.61 - 1.24 mg/dL   Calcium 8.5 (L) 8.9 - 10.3 mg/dL   Total Protein 6.0 (L) 6.5 - 8.1 g/dL   Albumin 3.4 (L) 3.5 - 5.0 g/dL   AST 16 15 - 41 U/L   ALT 18 0 - 44 U/L   Alkaline Phosphatase 80 38 - 126 U/L   Total Bilirubin 1.6 (H) 0.3 - 1.2 mg/dL   GFR, Estimated 22 (L) >60 mL/min   Anion gap 7 5 - 15  CBC with Differential/Platelet     Status: Abnormal   Collection Time: 08/09/22  6:03 AM  Result Value Ref Range   WBC 3.1 (L) 4.0 - 10.5 K/uL   RBC 2.42 (L) 4.22 - 5.81 MIL/uL   Hemoglobin 8.3 (L) 13.0 - 17.0 g/dL   HCT 25.8 (L) 39.0 - 52.0 %   MCV 106.6 (H) 80.0 - 100.0 fL   MCH 34.3 (H) 26.0 - 34.0 pg   MCHC 32.2 30.0 - 36.0 g/dL   RDW 15.1 11.5 - 15.5 %   Platelets 83 (L) 150 - 400 K/uL   nRBC 0.0 0.0 - 0.2 %   Neutrophils Relative % 68 %   Neutro Abs 2.1 1.7 - 7.7 K/uL   Lymphocytes Relative 20 %   Lymphs Abs 0.6 (L) 0.7 - 4.0 K/uL  Monocytes Relative 8 %   Monocytes Absolute 0.2 0.1 - 1.0 K/uL    Eosinophils Relative 3 %   Eosinophils Absolute 0.1 0.0 - 0.5 K/uL   Basophils Relative 1 %   Basophils Absolute 0.0 0.0 - 0.1 K/uL   Immature Granulocytes 0 %   Abs Immature Granulocytes 0.01 0.00 - 0.07 K/uL  Magnesium     Status: None   Collection Time: 08/09/22  6:03 AM  Result Value Ref Range   Magnesium 2.3 1.7 - 2.4 mg/dL  CBG monitoring, ED     Status: Abnormal   Collection Time: 08/09/22  8:00 AM  Result Value Ref Range   Glucose-Capillary 138 (H) 70 - 99 mg/dL  CBG monitoring, ED     Status: Abnormal   Collection Time: 08/09/22 11:13 AM  Result Value Ref Range   Glucose-Capillary 191 (H) 70 - 99 mg/dL   Comment 1 Notify RN    CT CHEST WO CONTRAST  Result Date: 08/08/2022 CLINICAL DATA:  Pleural effusion EXAM: CT CHEST WITHOUT CONTRAST TECHNIQUE: Multidetector CT imaging of the chest was performed following the standard protocol without IV contrast. RADIATION DOSE REDUCTION: This exam was performed according to the departmental dose-optimization program which includes automated exposure control, adjustment of the mA and/or kV according to patient size and/or use of iterative reconstruction technique. COMPARISON:  Previous CT done on 03/03/2022, chest radiographs including the examination done today FINDINGS: Cardiovascular: Heart is enlarged in size. Coronary artery calcifications are seen. Pacemaker battery is seen in left infraclavicular region with biventricular leads. There is previous coronary bypass surgery. Mediastinum/Nodes: No new significant lymphadenopathy is seen. Lungs/Pleura: There are patchy infiltrates in the lower lung fields, more so on the left side. There is interval clearing of faint ground-glass nodular infiltrate in right upper lobe since 03/03/2022. Small to moderate bilateral pleural effusions are seen, more so on the left side. Portions of left pleural effusion appears to be loculated. There is interval increase in amount of bilateral pleural effusions. There  are no pockets of air in the pleural effusions. There is no pneumothorax. Upper Abdomen: Arterial calcifications are seen. Surgical clips are seen in gallbladder fossa. Musculoskeletal: No acute findings are seen. IMPRESSION: Small to moderate bilateral pleural effusions, more so on the left side with interval increase. Part of the left pleural effusion appears to be loculated. There are patchy infiltrates in both lower lung fields, more so on the left side suggesting atelectasis/pneumonia. Cardiomegaly.  Coronary artery disease. Other findings as described in the body of the report. Electronically Signed   By: Elmer Picker M.D.   On: 08/08/2022 20:06   DG Chest 2 View  Result Date: 08/08/2022 CLINICAL DATA:  Shortness of breath. EXAM: CHEST - 2 VIEW COMPARISON:  07/18/2022 FINDINGS: Lungs are hyperexpanded. The cardio pericardial silhouette is enlarged. Retrocardiac collapse/consolidation is similar to prior. Tiny bilateral pleural effusions evident. Bones are diffusely demineralized. Left-sided permanent pacemaker/AICD again noted. IMPRESSION: 1. Hyperexpansion with persistent retrocardiac collapse/consolidation. 2. Tiny bilateral pleural effusions. Electronically Signed   By: Misty Stanley M.D.   On: 08/08/2022 12:06     ASSESSMENT AND PLAN: Congestive heart failure with severe LV dysfunction last echocardiogram prior to CRT was 15%.  In spite of having biventricular pacing patient continues to be short of breath requiring high dose of diuretics.  Advised changing IV Lasix to Lasix drip.  Will get echocardiogram to evaluate ejection fraction.  Will make further recommendation based on echo findings.  Kamin Niblack A

## 2022-08-09 NOTE — Consult Note (Signed)
PULMONOLOGY         Date: 08/09/2022,   MRN# [id number] Rick Zuniga 11/10/1945     Admission                  Current   Referring provider: Dr Charleen Kirks   CHIEF COMPLAINT:   Acute on chronic hypoxemic respiratory failure   HISTORY OF PRESENT ILLNESS    HPI: Rick Zuniga is a 76 y.o. 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  Rick Zuniga states that for the past 4 days, he has been taking the Increased dose of torsemide and spironolactone as prescribed.  Despite this, he has been urinating less often and feels that his fluid levels have not improved at all if not increased.  He endorses shortness of breath and lower extremity edema that extends up into his groin.  He any chest pain or palpitations. On arrival to the ED, patient was normotensive at 135/79 with heart rate of 72.  He was saturating at 100% on room air.  Initial workup remarkable for potassium of 5.2, bicarb of 21, creatinine of 3.24, with GFR 19.  Hemoglobin is 9.0 with platelets of 105.  Troponin is elevated at 44 with flat trend to 47.  Patient report diarreah. He reports dribbling of urine a home. 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   PAST MEDICAL HISTORY   Past Medical History:  Diagnosis Date   AICD (automatic cardioverter/defibrillator) present    Arthritis    Cataract    Mixed OU   CHF (congestive heart failure) ([place])    Chronic kidney disease    Complication of anesthesia    Difficulty inserting IV, wants expert at IV's, no multiple attempts   COPD (chronic obstructive pulmonary disease) ([place])    COVID-19 05/23/2022   Diabetes ([place])    Type 2    Diabetic retinopathy (HCC)    NPDR OU   Heart disease    Hypertension    Hypertensive retinopathy    OU   Indigestion    Presence of permanent cardiac pacemaker    Recurrent pleural effusion on left    Thrombocytopenia (HCC)    Wears dentures    full upper and lower     SURGICAL HISTORY   Past Surgical History:  Procedure Laterality Date   ANTERIOR VITRECTOMY Right 07/02/2022   Procedure: ANTERIOR VITRECTOMY;  Surgeon: Leandrew Koyanagi, MD;  Location: [facility];  Service: Ophthalmology;  Laterality: Right;   APPENDECTOMY     BI-VENTRICULAR IMPLANTABLE CARDIOVERTER DEFIBRILLATOR  (CRT-D)  05/20/2022   CARDIAC CATHETERIZATION Right 04/16/2022   CARDIAC SURGERY  12/21/2009   bypass. 4 vessel   CATARACT EXTRACTION W/PHACO Left 08/07/2021   Procedure: CATARACT EXTRACTION PHACO AND INTRAOCULAR LENS PLACEMENT (IOC) LEFT DIABETIC 9.06 01:42.2;  Surgeon: Leandrew Koyanagi, MD;  Location: [facility];  Service: Ophthalmology;  Laterality: Left;  Diabetic   CATARACT EXTRACTION W/PHACO Right 07/02/2022   Procedure: CATARACT EXTRACTION PHACO AND INTRAOCULAR LENS PLACEMENT (IOC) RIGHT VISION BLUE HEALON 5 DIABETIC  10.62  01:16.7;  Surgeon: Leandrew Koyanagi, MD;  Location: [facility];  Service: Ophthalmology;  Laterality: Right;   CATARACT EXTRACTION W/PHACO Right 07/16/2022   Procedure:  REMOVAL OF LENS FRAMENTS RIGHT DIABETIC;  Surgeon: Leandrew Koyanagi, MD;  Location: [facility];  Service: Ophthalmology;  Laterality: Right;  patient wants last   CHOLECYSTECTOMY     COLONOSCOPY  12/23/2010   IR THORACENTESIS ASP PLEURAL SPACE W/IMG GUIDE  07/18/2022   THORACENTESIS     02/25/21, 10/17/21     FAMILY HISTORY   Family History  Problem Relation Age of Onset   CAD Mother    Kidney failure Father    Diabetes Father      SOCIAL HISTORY   Social History   Tobacco Use   Smoking status: Never   Smokeless tobacco: Never  Vaping Use    Vaping Use: Never used  Substance Use Topics   Alcohol use: No   Drug use: No     MEDICATIONS    Home Medication:  Current Outpatient Rx   Order #: [id number] Class: Historical Med   Order #: [id number] Class: Historical Med   Order #: [id number] Class: Historical Med   Order #: [id number] Class: Historical Med   Order #: [id number] Class: Historical Med   Order #: [id number] Class: Historical Med   Order #: [id number] Class: Historical Med   Order #: [id number] Class: Historical Med   Order #: [id number] Class: Historical Med   Order #: [id number] Class: Historical Med   Order #: [id number] Class: Historical Med   Order #: [id number] Class: Normal   Order #: [id number] Class: Historical Med    Current Medication:  Current Facility-Administered Medications:    0.9 %  sodium chloride infusion, 250 mL, Intravenous, PRN, Jose Persia, MD   acetaminophen (TYLENOL) tablet 650 mg, 650 mg, Oral, Q4H PRN, Jose Persia, MD   aspirin EC tablet 81 mg, 81 mg, Oral, Daily, Jose Persia, MD, 81 mg at 08/09/22 1043   furosemide (LASIX) 200 mg in dextrose 5 % 100 mL (2 mg/mL) infusion, 4 mg/hr, Intravenous, Continuous, Neoma Laming A, MD   heparin injection 5,000 Units, 5,000 Units, Subcutaneous, Q8H, Jose Persia, MD, 5,000 Units at 08/09/22 1355   insulin aspart (novoLOG) injection 0-15 Units, 0-15 Units, Subcutaneous, TID WC, Jose Persia, MD, 3 Units at 08/09/22 1213   ondansetron (ZOFRAN) injection 4 mg, 4 mg, Intravenous, Q6H PRN, Jose Persia, MD   sodium chloride flush (NS) 0.9 % injection 3 mL, 3 mL, Intravenous, Q12H, Jose Persia, MD, 3 mL at 08/09/22 1043   sodium chloride flush (NS) 0.9 % injection 3 mL, 3 mL, Intravenous, PRN, Jose Persia, MD  Current Outpatient Medications:    aspirin EC 81 MG tablet, Take 81 mg by mouth daily., Disp: , Rfl:    glipiZIDE (GLUCOTROL) 5 MG tablet, Take 5 mg by mouth daily before breakfast., Disp: , Rfl:    insulin degludec (TRESIBA) 100 UNIT/ML  FlexTouch Pen, Inject 10 Units into the skin daily., Disp: , Rfl:    losartan (COZAAR) 25 MG tablet, Take 25 mg by mouth daily., Disp: , Rfl:    metoprolol succinate (TOPROL-XL) 25 MG 24 hr tablet, Take 12.5 mg by mouth daily., Disp: , Rfl:    spironolactone (ALDACTONE) 50 MG tablet, Take 50 mg by mouth daily., Disp: , Rfl:    tamsulosin (FLOMAX) 0.4 MG CAPS capsule, Take 0.4 mg by mouth daily., Disp: , Rfl:    torsemide (DEMADEX) 100 MG tablet, Take 100 mg by mouth daily. For 7 days only, Disp: , Rfl:    torsemide (DEMADEX) 20 MG tablet, Take 20 mg by mouth 2 (two) times daily. , Disp: , Rfl:    Continuous Blood Gluc Receiver (FREESTYLE  LIBRE 14 DAY READER) DEVI, Use 1 Device as directed Dx: E11.29, Disp: , Rfl:    Continuous Blood Gluc Sensor (FREESTYLE LIBRE 14 DAY SENSOR) MISC, SMARTSIG:1 Kit(s) Topical Every 2 Weeks, Disp: , Rfl:    meclizine (ANTIVERT) 25 MG tablet, Take 1 tablet (25 mg total) by mouth 3 (three) times daily as needed for dizziness., Disp: 30 tablet, Rfl: 0   nitroGLYCERIN (NITROSTAT) 0.4 MG SL tablet, Place 0.4 mg under the tongue every 5 (five) minutes as needed for chest pain., Disp: , Rfl:     ALLERGIES   Pioglitazone, Furosemide, Metformin and related, Prednisone, Atorvastatin, Benazepril, Latex, Other, and Tape     REVIEW OF SYSTEMS    Review of Systems:  Gen:  Denies  fever, sweats, chills weigh loss  HEENT: Denies blurred vision, double vision, ear pain, eye pain, hearing loss, nose bleeds, sore throat Cardiac:  No dizziness, chest pain or heaviness, chest tightness,edema Resp:   reports dyspnea chronically  Gi: Denies swallowing difficulty, stomach pain, nausea or vomiting, diarrhea, constipation, bowel incontinence Gu:  Denies bladder incontinence, burning urine Ext:   Denies Joint pain, stiffness or swelling Skin: Denies  skin rash, easy bruising or bleeding or hives Endoc:  Denies polyuria, polydipsia , polyphagia or weight change Psych:   Denies  depression, insomnia or hallucinations   Other:  All other systems negative   VS: BP 120/63   Pulse (!) 59   Temp 98.3 F (36.8 C) (Oral)   Resp 16   SpO2 97%      PHYSICAL EXAM    GENERAL:NAD, no fevers, chills, no weakness no fatigue HEAD: Normocephalic, atraumatic.  EYES: Pupils equal, round, reactive to light. Extraocular muscles intact. No scleral icterus.  MOUTH: Moist mucosal membrane. Dentition intact. No abscess noted.  EAR, NOSE, THROAT: Clear without exudates. No external lesions.  NECK: Supple. No thyromegaly. No nodules. No JVD.  PULMONARY: decreased breath sounds with mild rhonchi worse at bases bilaterally.  CARDIOVASCULAR: S1 and S2. Regular rate and rhythm. No murmurs, rubs, or gallops. No edema. Pedal pulses 2+ bilaterally.  GASTROINTESTINAL: Soft, nontender, nondistended. No masses. Positive bowel sounds. No hepatosplenomegaly.  MUSCULOSKELETAL: No swelling, clubbing, or edema. Range of motion full in all extremities.  NEUROLOGIC: Cranial nerves II through XII are intact. No gross focal neurological deficits. Sensation intact. Reflexes intact.  SKIN: No ulceration, lesions, rashes, or cyanosis. Skin warm and dry. Turgor intact.  PSYCHIATRIC: Mood, affect within normal limits. The patient is awake, alert and oriented x 3. Insight, judgment intact.       IMAGING   _0 @   ASSESSMENT/PLAN   Acute on chronic hypoxemic respiratory failure -Due to severe and advanced CHF with resultant cardiorenal failure and diabetic nephropathy -recurrent bilateral effusions worse on left.  -patient has had numerous effusions drained via thoracentesis, he has declined pleurX cather -he is approaching need for HD -his fluid balance has not been documented accurately as he shares he has been urinating very aggreesively    Thank you for allowing me to participate in the care of this patient.   Patient/Family are satisfied with care plan and all questions have been  answered.    Provider disclosure: Patient with at least one acute or chronic illness or injury that poses a threat to life or bodily function and is being managed actively during this encounter.  All of the below services have been performed independently by signing provider:  review of prior documentation from internal and or external health records.  Review of previous and current lab results.  Interview and comprehensive assessment during patient visit today. Review of current and previous chest radiographs/CT scans. Discussion of management and test interpretation with health care team and patient/family.   This document was prepared using Dragon voice recognition software and may include unintentional dictation errors.     Ottie Glazier, M.D.  Division of Pulmonary & Critical Care Medicine

## 2022-08-09 NOTE — Progress Notes (Signed)
PROGRESS NOTE    Rick Zuniga  OAC:[id number] DOB: May 29, 1946 DOA: 08/08/2022 PCP: [name], MD    Chief Complaint  Patient presents with   Pleural Effusion    Brief Narrative:  HPI per Dr. Marcelyn Bruins is a 76 y.o. 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   Rick Zuniga states that for the past 4 days, he has been taking the Increased dose of torsemide and spironolactone as prescribed by his pulmonologist.  Despite this, he has been urinating less often and feels that his fluid levels have not improved at all if not increased.  He endorses shortness of breath and lower extremity edema that extends up into his groin.  He any chest pain or palpitations.   Per chart review, patient had a recent visit with his pulmonologist, Dr. Lanney Gins, at which time palliative Pleurx catheter was discussed.  In addition, he was started on spironolactone and his torsemide dose was increased from 40 mg to 100 mg.  Per note, patient at that time agreed to Pleurx.  I discussed this plan with Rick Zuniga and he states that he has spoken to his other physicians, who have recommended he not have that done.   ED course: On arrival to the ED, patient was normotensive at 135/79 with heart rate of 72.  He was saturating at 100% on room air.  Initial workup remarkable for potassium of 5.2, bicarb of 21, creatinine of 3.24, with GFR 19.  Hemoglobin is 9.0 with platelets of 105.  Troponin is elevated at 44 with flat trend to 47.  EKG with atrial sensed ventricular pacing with multiple PVCs.  No ST or T wave changes concerning for acute  ischemia.  Chest x-ray with bilateral pleural effusions, small, and hyperexpansion with persistent retrocardiac collapse/consolidation.  Due to acute on chronic heart failure, patient was started on Lasix and [facility] contacted for admission.      Assessment & Plan:  Principal Problem:   Acute on chronic HFrEF (heart failure with reduced ejection fraction) (HCC) Active Problems:   Pleural effusion on left   Acute kidney injury superimposed on CKD (HCC)   Goals of care, counseling/discussion   Accelerated hypertension   Type 2 diabetes mellitus with microalbuminuria, with long-term current use of insulin (HCC)   Thrombocytopenia (HCC)   CAD (coronary artery disease)   Acute heart failure (HCC)    Assessment and Plan: * Acute on chronic HFrEF (heart failure with reduced ejection fraction) ([place]) Patient presented with worsening shortness of breath and lower extremity edema extending up to the sacrum at this point despite home torsemide being increased from 40 mg daily to 100 mg daily.   -BNP markedly elevated at 3000.   -Prior echocardiogram obtained on 05/18/2022 notable for severe biventricular failure with LVEF less than 15% done at outside hospital/[facility] everywhere.  This was prior to CRT-D placement.  No repeat echocardiogram since then. -Patient started on Lasix 80 mg IV every 12 hours and states having good urine output. -Urine output recorded of 1 L since admission. -Repeat 2D echo pending. -Strict I's and O's, daily weights. -Due to complicated cardiac history, presentation with acute on chronic CKD will consult  with cardiology for further evaluation and management.  Pleural effusion on left Patient stated he was recommended to be evaluated in the ED by his pulmonologist due to shortness of breath with possible recurrence of pleural effusion.  At that time, they discussed Pleurx catheter, which he is declining at this time.  -CXR x-ray relatively unchanged from 3 weeks  ago. -Continue diuresis as above for acute CHF exacerbation. -CT chest done with concerns for loculated pleural effusion. -Consult pulmonary for further evaluation and management.  Acute kidney injury superimposed on CKD ([place]) Creatinine on presentation elevated at 3.24 compared to baseline between 2.2 and 2.5.   -Potassium elevated at 5.2 despite being on high-dose diuretics.  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  He states that he follows with a nephrologist but he has never been told that his kidney function had declined so much. -Patient placed on IV Lasix 80 mg every 12 with urine output of 1 L recorded since admission. -Creatinine trending back down currently at 2.81 from 3.24 on admission. -Monitor renal function with diuresis. -Avoid nephrotoxic agents. -Continue to hold home regimen spironolactone and losartan. -Outpatient follow-up with primary nephrologist.  Goals of care, counseling/discussion When approaching the topic of goals of care and CODE STATUS, Rick Zuniga and his significant other Ms. Charlynn Grimes were quite shocked and surprised to Dr. Charleen Kirks (admitting physician's) concerns regarding his severe biventricular heart failure that seems to be unresponsive to CRT-D, history of CKD stage IV and recurrent pleural effusions that have required numerous thoracentesis.  They had not considered the topic of palliative care and did not realize that the Pleurx was palliative in nature.   -Patient and his significant other seem to have a poor understanding of the severity of patient's chronic multiorgan failure.  They would like to think before a Palliative consult is placed. -Revisited with patient and significant other about palliative care and at this time want to hold off on palliative care involvement at this time. -In my view patient and significant other seem to also have a poor understanding of severity of  patient's chronic multiorgan failure. -Cardiology, pulmonary consulted hopefully they can shed further light into this for patient and his loved one.  -Will need outpatient follow-up with his nephrologist as well. -Will hold off on any palliative care consultation at this time per patient and significant other request.   Accelerated hypertension - BP noted to be soft, losartan held in the setting of AKI.   -Patient on IV Lasix.   -Follow.   Type 2 diabetes mellitus with microalbuminuria, with long-term current use of insulin (HCC) - Hemoglobin A1c noted at 10.9  (06/08/2020 ) -CBG 138 this morning. -Hold home regimen oral hypoglycemic agents. -SSI.  Thrombocytopenia (HCC) Chronic. -Platelet count slowly trending down, monitor closely. -Patient denies any bleeding.  CAD (coronary artery disease) No evidence of acute EKG changes and patient is chest pain-free.  Troponin leak is likely in the setting of CKD.  -Continue home aspirin -Consult with cardiology.         DVT prophylaxis: Heparin Code Status: Full Family Communication: Updated patient fianc at bedside. Disposition: Likely home when clinically improved, cleared by cardiology and pulmonary.  Status is: Inpatient The patient will require care spanning > 2 midnights and should be moved to inpatient because: Severity of illness   Consultants:  Pulmonary pending Cardiology pending  Procedures:  CT chest 08/09/2022 Chest x-ray 08/08/2022  Antimicrobials:  None   Subjective: Patient laying on gurney in the ED.  Feels IV Lasix is working states he has had good urine output.  Feels swelling is improving.  Denies any chest pain no abdominal pain.  Fianc at bedside.  Objective: Vitals:   08/09/22 0639 08/09/22 0700 08/09/22 0847 08/09/22 0900  BP:  107/71 120/64 120/63  Pulse:  62 62 (!) 59  Resp:  20 19 16   Temp: 98 F (36.7 C)  98.3 F (36.8 C)   TempSrc: Oral  Oral   SpO2:  97% 100% 97%     Intake/Output Summary (Last 24 hours) at 08/09/2022 1653 Last data filed at 08/09/2022 2248 Gross per 24 hour  Intake --  Output 1000 ml  Net -1000 ml   There were no vitals filed for this visit.  Examination:  General exam: Appears calm and comfortable  Respiratory system: Diffuse crackles.  No wheezing.  Fair air movement.  Speaking in full sentences.   Cardiovascular system: RRR no murmurs rubs or gallops.  Positive JVD.  3+ bilateral lower extremity edema up to hips.  Gastrointestinal system: Abdomen is nondistended, soft and nontender. No organomegaly or masses felt. Normal bowel sounds heard. Central nervous system: Alert and oriented. No focal neurological deficits. Extremities: Symmetric 5 x 5 power. Skin: No rashes, lesions or ulcers Psychiatry: Judgement and insight appear normal. Mood & affect appropriate.     Data Reviewed:   CBC: Recent Labs  Lab 08/08/22 1132 08/09/22 0603  WBC 4.4 3.1*  NEUTROABS  --  2.1  HGB 9.0* 8.3*  HCT 28.8* 25.8*  MCV 107.5* 106.6*  PLT 105* 83*    Basic Metabolic Panel: Recent Labs  Lab 08/08/22 1132 08/09/22 0603  NA 140 141  K 5.2* 4.7  CL 111 114*  CO2 21* 20*  GLUCOSE 133* 101*  BUN 87* 85*  CREATININE 3.24* 2.81*  CALCIUM 8.8* 8.5*  MG  --  2.3    GFR: CrCl cannot be calculated (Unknown ideal weight.).  Liver Function Tests: Recent Labs  Lab 08/09/22 0603  AST 16  ALT 18  ALKPHOS 80  BILITOT 1.6*  PROT 6.0*  ALBUMIN 3.4*    CBG: Recent Labs  Lab 08/08/22 2035 08/09/22 0600 08/09/22 0800 08/09/22 1113 08/09/22 1625  GLUCAP 152* 101* 138* 191* 105*     No results found for this or any previous visit (from the past 240 hour(s)).       Radiology Studies: CT CHEST WO CONTRAST  Result Date: 08/08/2022 CLINICAL DATA:  Pleural effusion EXAM: CT CHEST WITHOUT CONTRAST TECHNIQUE: Multidetector CT imaging of the chest was performed following the standard protocol without IV contrast.  RADIATION DOSE REDUCTION: This exam was performed according to the departmental dose-optimization program which includes automated exposure control, adjustment of the mA and/or kV according to patient size and/or use of iterative reconstruction technique. COMPARISON:  Previous CT done on 03/03/2022, chest radiographs including the examination done today FINDINGS: Cardiovascular: Heart is enlarged in size. Coronary artery calcifications are seen. Pacemaker battery is seen in left infraclavicular region with biventricular leads. There is previous coronary bypass surgery. Mediastinum/Nodes: No new significant lymphadenopathy is seen. Lungs/Pleura: There are patchy infiltrates in the lower lung fields, more so on the left side. There is interval clearing of faint ground-glass nodular infiltrate in right upper lobe since 03/03/2022. Small to moderate bilateral pleural effusions are seen, more so on the left side. Portions of left pleural effusion appears to be loculated. There is interval increase  in amount of bilateral pleural effusions. There are no pockets of air in the pleural effusions. There is no pneumothorax. Upper Abdomen: Arterial calcifications are seen. Surgical clips are seen in gallbladder fossa. Musculoskeletal: No acute findings are seen. IMPRESSION: Small to moderate bilateral pleural effusions, more so on the left side with interval increase. Part of the left pleural effusion appears to be loculated. There are patchy infiltrates in both lower lung fields, more so on the left side suggesting atelectasis/pneumonia. Cardiomegaly.  Coronary artery disease. Other findings as described in the body of the report. Electronically Signed   By: Elmer Picker M.D.   On: 08/08/2022 20:06   DG Chest 2 View  Result Date: 08/08/2022 CLINICAL DATA:  Shortness of breath. EXAM: CHEST - 2 VIEW COMPARISON:  07/18/2022 FINDINGS: Lungs are hyperexpanded. The cardio pericardial silhouette is enlarged. Retrocardiac  collapse/consolidation is similar to prior. Tiny bilateral pleural effusions evident. Bones are diffusely demineralized. Left-sided permanent pacemaker/AICD again noted. IMPRESSION: 1. Hyperexpansion with persistent retrocardiac collapse/consolidation. 2. Tiny bilateral pleural effusions. Electronically Signed   By: Misty Stanley M.D.   On: 08/08/2022 12:06        Scheduled Meds:  aspirin EC  81 mg Oral Daily   heparin  5,000 Units Subcutaneous Q8H   insulin aspart  0-15 Units Subcutaneous TID WC   sodium chloride flush  3 mL Intravenous Q12H   Continuous Infusions:  sodium chloride     furosemide (LASIX) 200 mg in dextrose 5 % 100 mL (2 mg/mL) infusion       LOS: 0 days    Time spent: 45 minutes    Irine Seal, MD [facility]   To contact the attending provider between 7A-7P or the covering provider during after hours 7P-7A, please log into the web site [email or web address] and access using universal  password for that web site. If you do not have the password, please call the hospital operator.  08/09/2022, 4:53 PM

## 2022-08-09 NOTE — ED Notes (Signed)
BS 212 

## 2022-08-10 ENCOUNTER — Inpatient Hospital Stay
Admit: 2022-08-10 | Discharge: 2022-08-10 | Disposition: A | Discharge status: Home / Self Care | Payer: Medicare Other | Attending: Cardiovascular Disease | Admitting: Cardiovascular Disease

## 2022-08-10 DIAGNOSIS — E8770 Fluid overload, unspecified: Secondary | ICD-10-CM | POA: Diagnosis not present

## 2022-08-10 DIAGNOSIS — J90 Pleural effusion, not elsewhere classified: Secondary | ICD-10-CM | POA: Diagnosis not present

## 2022-08-10 DIAGNOSIS — I5023 Acute on chronic systolic (congestive) heart failure: Secondary | ICD-10-CM | POA: Diagnosis not present

## 2022-08-10 DIAGNOSIS — N179 Acute kidney failure, unspecified: Secondary | ICD-10-CM | POA: Diagnosis not present

## 2022-08-10 LAB — GLUCOSE, CAPILLARY: Glucose-Capillary: 129 mg/dL — ABNORMAL HIGH (ref 70–99)

## 2022-08-10 LAB — ECHOCARDIOGRAM COMPLETE
AR max vel: 2.19 cm2
AV Area VTI: 1.94 cm2
AV Area mean vel: 2.03 cm2
AV Mean grad: 6 mmHg
AV Peak grad: 12.7 mmHg
Ao pk vel: 1.78 m/s
Area-P 1/2: 3.48 cm2
MV M vel: 4.62 m/s
MV Peak grad: 85.4 mmHg
P 1/2 time: 568 msec
S' Lateral: 5.7 cm
Single Plane A4C EF: 15.3 %

## 2022-08-10 LAB — BASIC METABOLIC PANEL
Anion gap: 8 (ref 5–15)
BUN: 88 mg/dL — ABNORMAL HIGH (ref 8–23)
CO2: 20 mmol/L — ABNORMAL LOW (ref 22–32)
Calcium: 8.3 mg/dL — ABNORMAL LOW (ref 8.9–10.3)
Chloride: 114 mmol/L — ABNORMAL HIGH (ref 98–111)
Creatinine, Ser: 2.64 mg/dL — ABNORMAL HIGH (ref 0.61–1.24)
GFR, Estimated: 24 mL/min — ABNORMAL LOW (ref >60)
Glucose, Bld: 142 mg/dL — ABNORMAL HIGH (ref 70–99)
Potassium: 4.4 mmol/L (ref 3.5–5.1)
Sodium: 142 mmol/L (ref 135–145)

## 2022-08-10 LAB — CBC WITH DIFFERENTIAL/PLATELET
Abs Immature Granulocytes: 0.01 10*3/uL (ref 0.00–0.07)
Basophils Absolute: 0 10*3/uL (ref 0.0–0.1)
Basophils Relative: 0 %
Eosinophils Absolute: 0.1 10*3/uL (ref 0.0–0.5)
Eosinophils Relative: 2 %
HCT: 25.1 % — ABNORMAL LOW (ref 39.0–52.0)
Hemoglobin: 8.1 g/dL — ABNORMAL LOW (ref 13.0–17.0)
Immature Granulocytes: 0 %
Lymphocytes Relative: 19 %
Lymphs Abs: 0.6 10*3/uL — ABNORMAL LOW (ref 0.7–4.0)
MCH: 34.2 pg — ABNORMAL HIGH (ref 26.0–34.0)
MCHC: 32.3 g/dL (ref 30.0–36.0)
MCV: 105.9 fL — ABNORMAL HIGH (ref 80.0–100.0)
Monocytes Absolute: 0.2 10*3/uL (ref 0.1–1.0)
Monocytes Relative: 8 %
Neutro Abs: 2.2 10*3/uL (ref 1.7–7.7)
Neutrophils Relative %: 71 %
Platelets: 85 10*3/uL — ABNORMAL LOW (ref 150–400)
RBC: 2.37 MIL/uL — ABNORMAL LOW (ref 4.22–5.81)
RDW: 14.8 % (ref 11.5–15.5)
WBC: 3.1 10*3/uL — ABNORMAL LOW (ref 4.0–10.5)
nRBC: 0 % (ref 0.0–0.2)

## 2022-08-10 LAB — CBG MONITORING, ED
Glucose-Capillary: 129 mg/dL — ABNORMAL HIGH (ref 70–99)
Glucose-Capillary: 219 mg/dL — ABNORMAL HIGH (ref 70–99)
Glucose-Capillary: 104 mg/dL — ABNORMAL HIGH (ref 70–99)

## 2022-08-10 LAB — MAGNESIUM: Magnesium: 2.3 mg/dL (ref 1.7–2.4)

## 2022-08-10 NOTE — ED Notes (Signed)
Report given to Leala, RN  

## 2022-08-10 NOTE — Progress Notes (Signed)
PROGRESS NOTE    Rick Zuniga  JSE:[id number] DOB: [birth year] DOA: 08/08/2022 PCP: [name], MD    Chief Complaint  Patient presents with   Pleural Effusion    Brief Narrative:  HPI per Dr. Marcelyn Bruins is a 76 y.o. 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   Rick Zuniga states that for the past 4 days, he has been taking the Increased dose of torsemide and spironolactone as prescribed by his pulmonologist.  Despite this, he has been urinating less often and feels that his fluid levels have not improved at all if not increased.  He endorses shortness of breath and lower extremity edema that extends up into his groin.  He any chest pain or palpitations.   Per chart review, patient had a recent visit with his pulmonologist, Dr. Lanney Gins, at which time palliative Pleurx catheter was discussed.  In addition, he was started on spironolactone and his torsemide dose was increased from 40 mg to 100 mg.  Per note, patient at that time agreed to Pleurx.  I discussed this plan with Rick Zuniga and he states that he has spoken to his other physicians, who have recommended he not have that done.   ED course: On arrival to the ED, patient was normotensive at 135/79 with heart rate of 72.  He was saturating at 100% on room air.  Initial workup remarkable for potassium of 5.2, bicarb of 21, creatinine of 3.24, with GFR 19.  Hemoglobin is 9.0 with platelets of 105.  Troponin is elevated at 44 with flat trend to 47.  EKG with atrial sensed ventricular pacing with multiple PVCs.  No ST or T wave changes concerning for acute  ischemia.  Chest x-ray with bilateral pleural effusions, small, and hyperexpansion with persistent retrocardiac collapse/consolidation.  Due to acute on chronic heart failure, patient was started on Lasix and [facility] contacted for admission.      Assessment & Plan:  Principal Problem:   Acute on chronic HFrEF (heart failure with reduced ejection fraction) (HCC) Active Problems:   Pleural effusion on left   Acute kidney injury superimposed on CKD (HCC)   Goals of care, counseling/discussion   Accelerated hypertension   Type 2 diabetes mellitus with microalbuminuria, with long-term current use of insulin (HCC)   Thrombocytopenia (HCC)   CAD (coronary artery disease)   Acute exacerbation of CHF (congestive heart failure) (HCC)   Acute heart failure (HCC)   Hypervolemia   Pleural effusion    Assessment and Plan: * Acute on chronic HFrEF (heart failure with reduced ejection fraction) ([place]) Patient presented with worsening shortness of breath and lower extremity edema extending up to the sacrum at this point despite home torsemide being increased from 40 mg daily to 100 mg daily.   -BNP markedly elevated at 3000.   -Prior echocardiogram obtained on 05/18/2022 notable for severe biventricular failure with LVEF less than 15% done at outside hospital/[facility] everywhere.  This was prior to CRT-D placement.  No repeat echocardiogram since then. -Patient started on Lasix 80 mg IV every 12 hours initially on admission with good urine output.   -2D echo obtained with a EF of < 20%, left ventricular  global hypokinesis, grade 3 diastolic dysfunction, moderate LVH, mildly reduced right ventricular systolic function, moderately enlarged right ventricular size, severely dilated left atrial size, no mitral stenosis, mild to moderate TVR, no aortic stenosis noted.   -Patient seen in consultation by cardiology and patient placed on a Lasix drip with urine output of 1.620 L over the past 24 hours.    -Weight ordered but not recorded. -Strict I's and O's, daily weights. -Due to complicated cardiac history, presentation with acute on chronic CKD cardiology consulted and following and managing.    Pleural effusion on left Patient stated he was recommended to be evaluated in the ED by his pulmonologist due to shortness of breath with possible recurrence of pleural effusion.  At that time, they discussed Pleurx catheter, which he is declining at this time.  -CXR x-ray relatively unchanged from 3 weeks ago. -Continue diuresis as above for acute CHF exacerbation. -CT chest done with concerns for loculated pleural effusion. -Primary pulmonologist consulted to assess the patient and following.   -It is noted per primary pulmonologist that patient has had recurrent bilateral effusions worse on the left, numerous effusions drained via thoracentesis patient noted to have declined Pleurx catheter and pulmonary feels patient may be approaching need for HD.  Pulmonary consulted with nephrology who has assessed the patient and at this time does not feel any need for HD as patient responding well to Lasix drip. -No plans for intervention at this time per pulmonary.   -Pulmonary following.   Acute kidney injury superimposed on CKD ([place]) Creatinine on presentation elevated at 3.24 compared to baseline between 2.2 and 2.5.   -Potassium elevated at 5.2 despite being on high-dose diuretics.  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  He states that he follows with a nephrologist but he has never been told that his kidney function had declined so much. -Patient placed on IV Lasix 80 mg every 12 with urine output and assessed by cardiology and placed on a Lasix drip.  -Patient states he has had good urine output on Lasix drip, however urine output not properly recorded as noted that patient has had 1.6 L output over the past 24 hours.  -Patient noted to  be responding well to Lasix drip with creatinine trending down currently at 2.64 from 3.24 on admission.   -Patient seen in consultation by nephrology who recommend continuation of Lasix drip at this and no need for hemodialysis at this time and to monitor with diuresis.  Urine output of 1 L recorded since admission. -Continue to hold home regimen spironolactone and losartan. -Nephrology following.  Goals of care, counseling/discussion When approaching the topic of goals of care and CODE STATUS, Rick Zuniga and his significant other Ms. Charlynn Grimes were quite shocked and surprised to Dr. Charleen Kirks (admitting physician's) concerns regarding his severe biventricular heart failure that seems to be unresponsive to CRT-D, history of CKD stage IV and recurrent pleural effusions that have required numerous thoracentesis.  They had not considered the topic of palliative care and did not realize that the Pleurx was palliative in nature.   -Patient and his significant other seem to have a poor understanding of the severity of patient's chronic multiorgan failure.  They would like to think before a Palliative consult is placed. -Revisited with patient and significant other about palliative care and patient and significant other at this time want to hold off on palliative care involvement at this time. -  In my view patient and significant other seem to also have a poor understanding of severity of patient's chronic multiorgan failure. -Cardiology, pulmonary nephrology consulted and are following and hopefully they can shed further light into this for patient and his loved one.  -Will hold off on any palliative care consultation at this time per patient and significant other request. -Outpatient follow-up with PCP as well.   Accelerated hypertension - BP noted to be soft on presentation and as such patient's losartan held in the setting of AKI. -Patient currently being diuresed on a Lasix drip. -Follow.   Type 2  diabetes mellitus with microalbuminuria, with long-term current use of insulin (HCC) - Hemoglobin A1c noted at 10.9  (06/08/2020 ) -CBG 129 this morning.   -Continue to hold home regimen hypoglycemic agents.   -SSI.   Thrombocytopenia (HCC) Chronic. -Platelet count slowly trending down but seems to be plateauing and stabilizing. -Patient with no overt bleeding. -Follow.  CAD (coronary artery disease) No evidence of acute EKG changes and patient is chest pain-free.  Troponin leak is likely in the setting of CKD. -Continue home regimen aspirin. -ARB on hold due to AKI and soft blood pressure. -Per cardiology.         DVT prophylaxis: Heparin Code Status: Full Family Communication: Updated patient and fianc at bedside. Disposition: Likely home when clinically improved, cleared by cardiology and pulmonary.  Status is: Inpatient The patient will require care spanning > 2 midnights and should be moved to inpatient because: Severity of illness   Consultants:  Pulmonary: Dr.Aleskerov 08/09/2022 Cardiology: Dr. Humphrey Rolls 08/09/2022 Nephrology: Dr. Candiss Norse 08/10/2022  Procedures:  CT chest 08/09/2022 Chest x-ray 08/08/2022 2D echo 08/10/2022  Antimicrobials:  None   Subjective: Patient sitting on bedside commode.  States had significant urine output yesterday.  States breathing is better.  Feels lower extremity edema has improved.  Feels he is getting better daily.  Stated was just seen by the cardiologist and nephrologist this morning.    Objective: Vitals:   08/10/22 1200 08/10/22 1300 08/10/22 1400 08/10/22 1500  BP: 138/73 129/75 118/70 (!) 140/66  Pulse: 71 65 71 65  Resp: 15 20 18 17   Temp: (!) 97.5 F (36.4 C)     TempSrc: Oral     SpO2: 96% 99% 99% 98%    Intake/Output Summary (Last 24 hours) at 08/10/2022 1609 Last data filed at 08/10/2022 1200 Gross per 24 hour  Intake --  Output 3170 ml  Net -3170 ml   There were no vitals filed for this  visit.  Examination:  General exam: Appears calm and comfortable  Respiratory system: Bibasilar crackles.  No wheezing.  Fair air movement.  Speaking in full sentences.   Cardiovascular system: Regular rate rhythm no murmurs rubs or gallops.  Positive JVD.  2-3+ bilateral lower extremity edema up to hips.  Gastrointestinal system: Abdomen is soft, nontender, nondistended, positive bowel sounds.  No rebound.  No guarding.  Central nervous system: Alert and oriented. No focal neurological deficits. Extremities: Symmetric 5 x 5 power. Skin: No rashes, lesions or ulcers Psychiatry: Judgement and insight appear normal. Mood & affect appropriate.     Data Reviewed:   CBC: Recent Labs  Lab 08/08/22 1132 08/09/22 0603 08/10/22 0544  WBC 4.4 3.1* 3.1*  NEUTROABS  --  2.1 2.2  HGB 9.0* 8.3* 8.1*  HCT 28.8* 25.8* 25.1*  MCV 107.5* 106.6* 105.9*  PLT 105* 83* 85*    Basic Metabolic Panel: Recent Labs  Lab 08/08/22 1132  08/09/22 0603 08/10/22 0544  NA 140 141 142  K 5.2* 4.7 4.4  CL 111 114* 114*  CO2 21* 20* 20*  GLUCOSE 133* 101* 142*  BUN 87* 85* 88*  CREATININE 3.24* 2.81* 2.64*  CALCIUM 8.8* 8.5* 8.3*  MG  --  2.3 2.3    GFR: CrCl cannot be calculated (Unknown ideal weight.).  Liver Function Tests: Recent Labs  Lab 08/09/22 0603  AST 16  ALT 18  ALKPHOS 80  BILITOT 1.6*  PROT 6.0*  ALBUMIN 3.4*    CBG: Recent Labs  Lab 08/09/22 1113 08/09/22 1625 08/09/22 2209 08/10/22 0755 08/10/22 1145  GLUCAP 191* 105* 212* 129* 219*     No results found for this or any previous visit (from the past 240 hour(s)).       Radiology Studies: ECHOCARDIOGRAM COMPLETE  Result Date: 08/10/2022    ECHOCARDIOGRAM REPORT   Patient Name:   Rick Zuniga Date of Exam: 08/10/2022 Medical Rec #:  [id number]           Height:       73.0 in Accession #:    [id number]          Weight:       184.0 lb Date of Birth:  [birth year]           BSA:          2.077 m Patient  Age:    45 years            BP:           133/70 mmHg Patient Gender: M                   HR:           71 bpm. Exam Location:  [facility] Procedure: 2D Echo, Color Doppler and Cardiac Doppler Indications:     I50.9 Congestive Heart Failure  History:         Patient has prior history of Echocardiogram examinations, most                  recent 02/08/2020. Defibrillator, CKD and COPD; Risk                  Factors:Hypertension and Diabetes.  Sonographer:     Charmayne Sheer Referring Phys:  [place] Diagnosing Phys: [place]  1. Left ventricular ejection fraction, by estimation, is <20%. The left ventricle has severely decreased function. The left ventricle demonstrates global hypokinesis. The left ventricular internal cavity size was severely dilated. There is moderate left  ventricular hypertrophy. Left ventricular diastolic parameters are consistent with Grade III diastolic dysfunction (restrictive).  2. Right ventricular systolic function is mildly reduced. The right ventricular size is moderately enlarged.  3. Left atrial size was severely dilated.  4. The mitral valve is normal in structure. Mild to moderate mitral valve regurgitation. No evidence of mitral stenosis.  5. Tricuspid valve regurgitation is mild to moderate.  6. The aortic valve is normal in structure. Aortic valve regurgitation is mild to moderate. No aortic stenosis is present.  7. The inferior vena cava is normal in size with greater than 50% respiratory variability, suggesting right atrial pressure of 3 mmHg. Conclusion(s)/Recommendation(s): Findings consistent with dilated cardiomyopathy. FINDINGS  Left Ventricle: Left ventricular ejection fraction, by estimation, is <20%. The left ventricle has severely decreased function. The left ventricle demonstrates global hypokinesis. The left ventricular internal cavity size was severely dilated. There is moderate  left ventricular hypertrophy. Left ventricular diastolic parameters are  consistent with Grade III diastolic dysfunction (restrictive). Right Ventricle: The right ventricular size is moderately enlarged. No increase in right ventricular wall thickness. Right ventricular systolic function is mildly reduced. Left Atrium: Left atrial size was severely dilated. Right Atrium: Right atrial size was normal in size. Pericardium: There is no evidence of pericardial effusion. Mitral Valve: The mitral valve is normal in structure. Mild to moderate mitral valve regurgitation. No evidence of mitral valve stenosis. Tricuspid Valve: The tricuspid valve is normal in structure. Tricuspid valve regurgitation is mild to moderate. No evidence of tricuspid stenosis. Aortic Valve: The aortic valve is normal in structure. Aortic valve regurgitation is mild to moderate. Aortic regurgitation PHT measures 568 msec. No aortic stenosis is present. Aortic valve mean gradient measures 6.0 mmHg. Aortic valve peak gradient measures 12.7 mmHg. Aortic valve area, by VTI measures 1.94 cm. Pulmonic Valve: The pulmonic valve was normal in structure. Pulmonic valve regurgitation is trivial. No evidence of pulmonic stenosis. Aorta: The aortic root is normal in size and structure. Venous: The inferior vena cava is normal in size with greater than 50% respiratory variability, suggesting right atrial pressure of 3 mmHg. IAS/Shunts: No atrial level shunt detected by color flow Doppler.  LEFT VENTRICLE PLAX 2D LVIDd:         6.10 cm      Diastology LVIDs:         5.70 cm      LV e' medial:    4.35 cm/s LV PW:         1.30 cm      LV E/e' medial:  22.3 LV IVS:        0.80 cm      LV e' lateral:   4.90 cm/s LVOT diam:     2.00 cm      LV E/e' lateral: 19.8 LV SV:         71 LV SV Index:   34 LVOT Area:     3.14 cm  LV Volumes (MOD) LV vol d, MOD A4C: 196.0 ml LV vol s, MOD A4C: 166.0 ml LV SV MOD A4C:     196.0 ml RIGHT VENTRICLE RV Basal diam:  4.80 cm RV S prime:     5.87 cm/s LEFT ATRIUM             Index        RIGHT ATRIUM            Index LA diam:        4.50 cm 2.17 cm/m   RA Area:     17.80 cm LA Vol (A2C):   63.3 ml 30.48 ml/m  RA Volume:   47.60 ml  22.92 ml/m LA Vol (A4C):   75.2 ml 36.21 ml/m LA Biplane Vol: 69.1 ml 33.27 ml/m  AORTIC VALVE                     PULMONIC VALVE AV Area (Vmax):    2.19 cm      PV Vmax:          1.17 m/s AV Area (Vmean):   2.03 cm      PV Vmean:         74.200 cm/s AV Area (VTI):     1.94 cm      PV VTI:           0.240 m AV Vmax:  178.00 cm/s   PV Peak grad:     5.5 mmHg AV Vmean:          118.000 cm/s  PV Mean grad:     3.0 mmHg AV VTI:            0.367 m       PR End Diast Vel: 7.40 msec AV Peak Grad:      12.7 mmHg AV Mean Grad:      6.0 mmHg LVOT Vmax:         124.00 cm/s LVOT Vmean:        76.100 cm/s LVOT VTI:          0.227 m LVOT/AV VTI ratio: 0.62 AI PHT:            568 msec  AORTA Ao Root diam: 3.20 cm MITRAL VALVE               TRICUSPID VALVE MV Area (PHT): 3.48 cm    TR Peak grad:   38.7 mmHg MV Decel Time: 218 msec    TR Vmax:        311.00 cm/s MR Peak grad: 85.4 mmHg MR Vmax:      462.00 cm/s  SHUNTS MV E velocity: 96.90 cm/s  Systemic VTI:  0.23 m MV A velocity: 55.00 cm/s  Systemic Diam: 2.00 cm MV E/A ratio:  1.76 Shaukat Khan Electronically signed by Neoma Laming Signature Date/Time: 08/10/2022/12:25:36 PM    Final    CT CHEST WO CONTRAST  Result Date: 08/08/2022 CLINICAL DATA:  Pleural effusion EXAM: CT CHEST WITHOUT CONTRAST TECHNIQUE: Multidetector CT imaging of the chest was performed following the standard protocol without IV contrast. RADIATION DOSE REDUCTION: This exam was performed according to the departmental dose-optimization program which includes automated exposure control, adjustment of the mA and/or kV according to patient size and/or use of iterative reconstruction technique. COMPARISON:  Previous CT done on 03/03/2022, chest radiographs including the examination done today FINDINGS: Cardiovascular: Heart is enlarged in size. Coronary artery  calcifications are seen. Pacemaker battery is seen in left infraclavicular region with biventricular leads. There is previous coronary bypass surgery. Mediastinum/Nodes: No new significant lymphadenopathy is seen. Lungs/Pleura: There are patchy infiltrates in the lower lung fields, more so on the left side. There is interval clearing of faint ground-glass nodular infiltrate in right upper lobe since 03/03/2022. Small to moderate bilateral pleural effusions are seen, more so on the left side. Portions of left pleural effusion appears to be loculated. There is interval increase in amount of bilateral pleural effusions. There are no pockets of air in the pleural effusions. There is no pneumothorax. Upper Abdomen: Arterial calcifications are seen. Surgical clips are seen in gallbladder fossa. Musculoskeletal: No acute findings are seen. IMPRESSION: Small to moderate bilateral pleural effusions, more so on the left side with interval increase. Part of the left pleural effusion appears to be loculated. There are patchy infiltrates in both lower lung fields, more so on the left side suggesting atelectasis/pneumonia. Cardiomegaly.  Coronary artery disease. Other findings as described in the body of the report. Electronically Signed   By: Elmer Picker M.D.   On: 08/08/2022 20:06        Scheduled Meds:  aspirin EC  81 mg Oral Daily   heparin  5,000 Units Subcutaneous Q8H   insulin aspart  0-15 Units Subcutaneous TID WC   sodium chloride flush  3 mL Intravenous Q12H   Continuous Infusions:  sodium chloride  furosemide (LASIX) 200 mg in dextrose 5 % 100 mL (2 mg/mL) infusion 4 mg/hr (08/09/22 1940)     LOS: 1 day    Time spent: 35 minutes    Irine Seal, MD [facility]   To contact the attending provider between 7A-7P or the covering provider during after hours 7P-7A, please log into the web site [email or web address] and access using universal [place] password for that web site. If  you do not have the password, please call the hospital operator.  08/10/2022, 4:09 PM

## 2022-08-10 NOTE — Progress Notes (Signed)
*  PRELIMINARY RESULTS* Echocardiogram 2D Echocardiogram has been performed.  Rick Zuniga [name] 08/10/2022, 9:44 AM

## 2022-08-10 NOTE — ED Notes (Signed)
Advised nurse that patient has ready bed 

## 2022-08-10 NOTE — Consult Note (Signed)
PULMONOLOGY         Date: 08/10/2022,   MRN# [id number] Rick Zuniga [birth year]     Admission                  Current   Referring provider: Dr Charleen Kirks   CHIEF COMPLAINT:   Acute on chronic hypoxemic respiratory failure   HISTORY OF PRESENT ILLNESS    HPI: Rick Zuniga is a 76 y.o. 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  Rick Zuniga states that for the past 4 days, he has been taking the Increased dose of torsemide and spironolactone as prescribed.  Despite this, he has been urinating less often and feels that his fluid levels have not improved at all if not increased.  He endorses shortness of breath and lower extremity edema that extends up into his groin.  He any chest pain or palpitations. On arrival to the ED, patient was normotensive at 135/79 with heart rate of 72.  He was saturating at 100% on room air.  Initial workup remarkable for potassium of 5.2, bicarb of 21, creatinine of 3.24, with GFR 19.  Hemoglobin is 9.0 with platelets of 105.  Troponin is elevated at 44 with flat trend to 47.  Patient report diarreah. He reports dribbling of urine a home. 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   08/10/22- patient is urinating heavily and is breathing better with less swelling. His BP is ok without hypotension. He had derm surgery on right face and required tylenol few times but no other pain anywhere. He's eating and going to bathroom normally. S/p nephrology evaluation, renal function is improving.   PAST MEDICAL HISTORY   Past Medical History:  Diagnosis Date   AICD (automatic cardioverter/defibrillator) present     Arthritis    Cataract    Mixed OU   CHF (congestive heart failure) ([place])    Chronic kidney disease    Complication of anesthesia    Difficulty inserting IV, wants expert at IV's, no multiple attempts   COPD (chronic obstructive pulmonary disease) ([place])    COVID-19 05/23/2022   Diabetes ([place])    Type 2   Diabetic retinopathy (HCC)    NPDR OU   Heart disease    Hypertension    Hypertensive retinopathy    OU   Indigestion    Presence of permanent cardiac pacemaker    Recurrent pleural effusion on left    Thrombocytopenia (HCC)    Wears dentures    full upper and lower     SURGICAL HISTORY   Past Surgical History:  Procedure Laterality Date   ANTERIOR VITRECTOMY Right 07/02/2022   Procedure: ANTERIOR VITRECTOMY;  Surgeon: Leandrew Koyanagi, MD;  Location: [facility];  Service: Ophthalmology;  Laterality: Right;   APPENDECTOMY     BI-VENTRICULAR IMPLANTABLE CARDIOVERTER DEFIBRILLATOR  (CRT-D)  05/20/2022   CARDIAC CATHETERIZATION Right 04/16/2022   CARDIAC SURGERY  12/21/2009   bypass. 4 vessel   CATARACT EXTRACTION W/PHACO Left 08/07/2021   Procedure: CATARACT EXTRACTION PHACO AND INTRAOCULAR LENS PLACEMENT (IOC) LEFT DIABETIC 9.06 01:42.2;  Surgeon: Leandrew Koyanagi, MD;  Location: [facility];  Service: Ophthalmology;  Laterality: Left;  Diabetic   CATARACT  EXTRACTION W/PHACO Right 07/02/2022   Procedure: CATARACT EXTRACTION PHACO AND INTRAOCULAR LENS PLACEMENT (IOC) RIGHT VISION BLUE HEALON 5 DIABETIC  10.62  01:16.7;  Surgeon: Leandrew Koyanagi, MD;  Location: [facility];  Service: Ophthalmology;  Laterality: Right;   CATARACT EXTRACTION W/PHACO Right 07/16/2022   Procedure: REMOVAL OF LENS FRAMENTS RIGHT DIABETIC;  Surgeon: Leandrew Koyanagi, MD;  Location: [facility];  Service: Ophthalmology;  Laterality: Right;  patient wants last   CHOLECYSTECTOMY     COLONOSCOPY  12/23/2010   IR THORACENTESIS ASP PLEURAL SPACE W/IMG  GUIDE  07/18/2022   THORACENTESIS     02/25/21, 10/17/21     FAMILY HISTORY   Family History  Problem Relation Age of Onset   CAD Mother    Kidney failure Father    Diabetes Father      SOCIAL HISTORY   Social History   Tobacco Use   Smoking status: Never   Smokeless tobacco: Never  Vaping Use   Vaping Use: Never used  Substance Use Topics   Alcohol use: No   Drug use: No     MEDICATIONS    Home Medication:  Current Outpatient Rx   Order #: [id number] Class: Historical Med   Order #: [id number] Class: Historical Med   Order #: [id number] Class: Historical Med   Order #: [id number] Class: Historical Med   Order #: [id number] Class: Historical Med   Order #: [id number] Class: Historical Med   Order #: [id number] Class: Historical Med   Order #: [id number] Class: Historical Med   Order #: [id number] Class: Historical Med   Order #: [id number] Class: Historical Med   Order #: [id number] Class: Historical Med   Order #: [id number] Class: Normal   Order #: [id number] Class: Historical Med    Current Medication:  Current Facility-Administered Medications:    0.9 %  sodium chloride infusion, 250 mL, Intravenous, PRN, Jose Persia, MD   acetaminophen (TYLENOL) tablet 650 mg, 650 mg, Oral, Q4H PRN, Jose Persia, MD, 650 mg at 08/10/22 1544   aspirin EC tablet 81 mg, 81 mg, Oral, Daily, Jose Persia, MD, 81 mg at 08/10/22 0902   furosemide (LASIX) 200 mg in dextrose 5 % 100 mL (2 mg/mL) infusion, 4 mg/hr, Intravenous, Continuous, Neoma Laming A, MD, Last Rate: 2 mL/hr at 08/09/22 1940, 4 mg/hr at 08/09/22 1940   heparin injection 5,000 Units, 5,000 Units, Subcutaneous, Q8H, Jose Persia, MD, 5,000 Units at 08/10/22 1519   insulin aspart (novoLOG) injection 0-15 Units, 0-15 Units, Subcutaneous, TID WC, Jose Persia, MD, 5 Units at 08/10/22 1159   ondansetron (ZOFRAN) injection 4 mg, 4 mg, Intravenous, Q6H PRN, Jose Persia, MD   sodium chloride flush (NS) 0.9 % injection 3 mL, 3  mL, Intravenous, Q12H, Jose Persia, MD, 3 mL at 08/10/22 0905   sodium chloride flush (NS) 0.9 % injection 3 mL, 3 mL, Intravenous, PRN, Jose Persia, MD  Current Outpatient Medications:    aspirin EC 81 MG tablet, Take 81 mg by mouth daily., Disp: , Rfl:    glipiZIDE (GLUCOTROL) 5 MG tablet, Take 5 mg by mouth daily before breakfast., Disp: , Rfl:    insulin degludec (TRESIBA) 100 UNIT/ML FlexTouch Pen, Inject 10 Units into the skin daily., Disp: , Rfl:    losartan (COZAAR) 25 MG tablet, Take 25 mg by mouth daily., Disp: , Rfl:    metoprolol succinate (TOPROL-XL) 25 MG 24 hr tablet, Take 12.5 mg by mouth daily., Disp: , Rfl:    spironolactone (ALDACTONE) 50 MG tablet, Take 50 mg by mouth daily., Disp: , Rfl:  tamsulosin (FLOMAX) 0.4 MG CAPS capsule, Take 0.4 mg by mouth daily., Disp: , Rfl:    torsemide (DEMADEX) 100 MG tablet, Take 100 mg by mouth daily. For 7 days only, Disp: , Rfl:    torsemide (DEMADEX) 20 MG tablet, Take 20 mg by mouth 2 (two) times daily. , Disp: , Rfl:    Continuous Blood Gluc Receiver (FREESTYLE LIBRE 14 DAY READER) DEVI, Use 1 Device as directed Dx: E11.29, Disp: , Rfl:    Continuous Blood Gluc Sensor (FREESTYLE LIBRE 14 DAY SENSOR) MISC, SMARTSIG:1 Kit(s) Topical Every 2 Weeks, Disp: , Rfl:    meclizine (ANTIVERT) 25 MG tablet, Take 1 tablet (25 mg total) by mouth 3 (three) times daily as needed for dizziness., Disp: 30 tablet, Rfl: 0   nitroGLYCERIN (NITROSTAT) 0.4 MG SL tablet, Place 0.4 mg under the tongue every 5 (five) minutes as needed for chest pain., Disp: , Rfl:     ALLERGIES   Pioglitazone, Furosemide, Metformin and related, Prednisone, Atorvastatin, Benazepril, Latex, Other, and Tape     REVIEW OF SYSTEMS    Review of Systems:  Gen:  Denies  fever, sweats, chills weigh loss  HEENT: Denies blurred vision, double vision, ear pain, eye pain, hearing loss, nose bleeds, sore throat Cardiac:  No dizziness, chest pain or heaviness, chest  tightness,edema Resp:   reports dyspnea chronically  Gi: Denies swallowing difficulty, stomach pain, nausea or vomiting, diarrhea, constipation, bowel incontinence Gu:  Denies bladder incontinence, burning urine Ext:   Denies Joint pain, stiffness or swelling Skin: Denies  skin rash, easy bruising or bleeding or hives Endoc:  Denies polyuria, polydipsia , polyphagia or weight change Psych:   Denies depression, insomnia or hallucinations   Other:  All other systems negative   VS: BP (!) 140/66   Pulse 65   Temp (!) 97.5 F (36.4 C) (Oral)   Resp 17   SpO2 98%      PHYSICAL EXAM    GENERAL:NAD, no fevers, chills, no weakness no fatigue HEAD: Normocephalic, atraumatic.  EYES: Pupils equal, round, reactive to light. Extraocular muscles intact. No scleral icterus.  MOUTH: Moist mucosal membrane. Dentition intact. No abscess noted.  EAR, NOSE, THROAT: Clear without exudates. No external lesions.  NECK: Supple. No thyromegaly. No nodules. No JVD.  PULMONARY: decreased breath sounds with mild rhonchi worse at bases bilaterally.  CARDIOVASCULAR: S1 and S2. Regular rate and rhythm. No murmurs, rubs, or gallops. No edema. Pedal pulses 2+ bilaterally.  GASTROINTESTINAL: Soft, nontender, nondistended. No masses. Positive bowel sounds. No hepatosplenomegaly.  MUSCULOSKELETAL: No swelling, clubbing, or edema. Range of motion full in all extremities.  NEUROLOGIC: Cranial nerves II through XII are intact. No gross focal neurological deficits. Sensation intact. Reflexes intact.  SKIN: No ulceration, lesions, rashes, or cyanosis. Skin warm and dry. Turgor intact.  PSYCHIATRIC: Mood, affect within normal limits. The patient is awake, alert and oriented x 3. Insight, judgment intact.       IMAGING   _0 @   ASSESSMENT/PLAN   Acute on chronic hypoxemic respiratory failure -Due to severe and advanced CHF with resultant cardiorenal failure and diabetic nephropathy -recurrent bilateral  effusions worse on left.  -patient has had numerous effusions drained via thoracentesis, he has declined pleurX cather -he is approaching need for HD -his fluid balance has not been documented accurately as he shares he has been urinating very aggreesively -patient is interval stable with net negative 4L   Thank you for allowing me to participate in the care of this  patient.   Patient/Family are satisfied with care plan and all questions have been answered.    Provider disclosure: Patient with at least one acute or chronic illness or injury that poses a threat to life or bodily function and is being managed actively during this encounter.  All of the below services have been performed independently by signing provider:  review of prior documentation from internal and or external health records.  Review of previous and current lab results.  Interview and comprehensive assessment during patient visit today. Review of current and previous chest radiographs/CT scans. Discussion of management and test interpretation with health care team and patient/family.   This document was prepared using Dragon voice recognition software and may include unintentional dictation errors.     Ottie Glazier, M.D.  Division of Pulmonary & Critical Care Medicine

## 2022-08-10 NOTE — Progress Notes (Signed)
[place], [place] 08/10/22  Subjective:   Hospital day # 1  Patient known to our practice from outpatient follow-up for CKD.  Currently admitted for volume overload.  Patient's fianc is in the room and states that he was progressively getting more edematous.  Therefore they decided to bring him in for evaluation.  They report that he does eat out and likes to eat hamburgers, hot dogs and other processed foods. Renal: 12/23 0701 - 12/24 0700 In: -  Out: 1620 [Urine:1620] Lab Results  Component Value Date   CREATININE 2.64 (H) 08/10/2022   CREATININE 2.81 (H) 08/09/2022   CREATININE 3.24 (H) 08/08/2022   He is being treated with IV furosemide drip. At admission, creatinine was elevated, but the trend is improving as noted  Objective:  Vital signs in last 24 hours:  Temp:  [97.7 F (36.5 C)-97.9 F (36.6 C)] 97.9 F (36.6 C) (12/24 0947) Pulse Rate:  [63-72] 70 (12/24 0700) Resp:  [11-21] 16 (12/24 0700) BP: (114-134)/(61-90) 133/70 (12/24 0700) SpO2:  [94 %-99 %] 98 % (12/24 0700)  Weight change:  There were no vitals filed for this visit.  Intake/Output:    Intake/Output Summary (Last 24 hours) at 08/10/2022 0954 Last data filed at 08/10/2022 5102 Gross per 24 hour  Intake --  Output 1620 ml  Net -1620 ml    Physical Exam: General:  No acute distress, laying in the bed  HEENT  anicteric, moist oral mucous membrane  Pulm/lungs  normal breathing effort, lungs are clear to auscultation  CVS/Heart  regular rhythm, no rub or gallop  Abdomen:   Soft, nontender  Extremities:  2+ peripheral edema  Neurologic:  Alert, oriented, able to follow commands  Skin:  No acute rashes   Basic Metabolic Panel:  Recent Labs  Lab 08/08/22 1132 08/09/22 0603 08/10/22 0544  NA 140 141 142  K 5.2* 4.7 4.4  CL 111 114* 114*  CO2 21* 20* 20*  GLUCOSE 133* 101* 142*  BUN 87* 85* 88*  CREATININE 3.24* 2.81* 2.64*  CALCIUM 8.8* 8.5* 8.3*  MG  --   2.3 2.3     CBC: Recent Labs  Lab 08/08/22 1132 08/09/22 0603 08/10/22 0544  WBC 4.4 3.1* 3.1*  NEUTROABS  --  2.1 2.2  HGB 9.0* 8.3* 8.1*  HCT 28.8* 25.8* 25.1*  MCV 107.5* 106.6* 105.9*  PLT 105* 83* 85*      Lab Results  Component Value Date   HEPBSAG NON REACTIVE 02/12/2020   HEPBIGM NON REACTIVE 02/12/2020      Microbiology:  No results found for this or any previous visit (from the past 240 hour(s)).  Coagulation Studies: No results for input(s): "LABPROT", "INR" in the last 72 hours.  Urinalysis: No results for input(s): "COLORURINE", "LABSPEC", "PHURINE", "GLUCOSEU", "HGBUR", "BILIRUBINUR", "KETONESUR", "PROTEINUR", "UROBILINOGEN", "NITRITE", "LEUKOCYTESUR" in the last 72 hours.  Invalid input(s): "APPERANCEUR"    Imaging: CT CHEST WO CONTRAST  Result Date: 08/08/2022 CLINICAL DATA:  Pleural effusion EXAM: CT CHEST WITHOUT CONTRAST TECHNIQUE: Multidetector CT imaging of the chest was performed following the standard protocol without IV contrast. RADIATION DOSE REDUCTION: This exam was performed according to the departmental dose-optimization program which includes automated exposure control, adjustment of the mA and/or kV according to patient size and/or use of iterative reconstruction technique. COMPARISON:  Previous CT done on 03/03/2022, chest radiographs including the examination done today FINDINGS: Cardiovascular: Heart is enlarged in size. Coronary artery calcifications are seen. Pacemaker battery is seen in  left infraclavicular region with biventricular leads. There is previous coronary bypass surgery. Mediastinum/Nodes: No new significant lymphadenopathy is seen. Lungs/Pleura: There are patchy infiltrates in the lower lung fields, more so on the left side. There is interval clearing of faint ground-glass nodular infiltrate in right upper lobe since 03/03/2022. Small to moderate bilateral pleural effusions are seen, more so on the left side. Portions of  left pleural effusion appears to be loculated. There is interval increase in amount of bilateral pleural effusions. There are no pockets of air in the pleural effusions. There is no pneumothorax. Upper Abdomen: Arterial calcifications are seen. Surgical clips are seen in gallbladder fossa. Musculoskeletal: No acute findings are seen. IMPRESSION: Small to moderate bilateral pleural effusions, more so on the left side with interval increase. Part of the left pleural effusion appears to be loculated. There are patchy infiltrates in both lower lung fields, more so on the left side suggesting atelectasis/pneumonia. Cardiomegaly.  Coronary artery disease. Other findings as described in the body of the report. Electronically Signed   By: Elmer Picker M.D.   On: 08/08/2022 20:06   DG Chest 2 View  Result Date: 08/08/2022 CLINICAL DATA:  Shortness of breath. EXAM: CHEST - 2 VIEW COMPARISON:  07/18/2022 FINDINGS: Lungs are hyperexpanded. The cardio pericardial silhouette is enlarged. Retrocardiac collapse/consolidation is similar to prior. Tiny bilateral pleural effusions evident. Bones are diffusely demineralized. Left-sided permanent pacemaker/AICD again noted. IMPRESSION: 1. Hyperexpansion with persistent retrocardiac collapse/consolidation. 2. Tiny bilateral pleural effusions. Electronically Signed   By: Misty Stanley M.D.   On: 08/08/2022 12:06     Medications:    sodium chloride     furosemide (LASIX) 200 mg in dextrose 5 % 100 mL (2 mg/mL) infusion 4 mg/hr (08/09/22 1940)    aspirin EC  81 mg Oral Daily   heparin  5,000 Units Subcutaneous Q8H   insulin aspart  0-15 Units Subcutaneous TID WC   sodium chloride flush  3 mL Intravenous Q12H   sodium chloride, acetaminophen, ondansetron (ZOFRAN) IV, sodium chloride flush  Assessment/ Plan:  76 y.o. 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  Patient responding well and edema is improving.  Continue Lasix infusion.  Currently at 4 mg/h.  #Acute kidney injury Likely secondary to hemodynamic instability and cardiorenal syndrome. Serum creatinine is now 1 grooving with improved hemodynamics. 2D echo was done and results are pending.  #Diabetic chronic kidney disease stage IV.  Followed by Dr. Holley Raring as outpatient. Baseline creatinine 2.2-2.4 with GFR around 28-30.  From October 2023. Outpatient weight of 80 kg.    LOS: 1 Elira Colasanti 12/24/20239:54 AM  [place], [place]  Note: This note was prepared with Dragon dictation. Any transcription errors are unintentional

## 2022-08-10 NOTE — ED Notes (Signed)
Report received from [place], RN  

## 2022-08-10 NOTE — ED Notes (Signed)
Did a full linen change on pt's bed. Pt has call bell, wife in room, no other needs at this time.

## 2022-08-10 NOTE — Progress Notes (Signed)
SUBJECTIVE: Patient is feeling much better   Vitals:   08/10/22 0545 08/10/22 0600 08/10/22 0700 08/10/22 0947  BP:  134/66 133/70   Pulse:  70 70   Resp:  17 16   Temp: 97.7 F (36.5 C)   97.9 F (36.6 C)  TempSrc: Oral   Oral  SpO2:  97% 98%     Intake/Output Summary (Last 24 hours) at 08/10/2022 1110 Last data filed at 08/10/2022 1041 Gross per 24 hour  Intake --  Output 2320 ml  Net -2320 ml    LABS: Basic Metabolic Panel: Recent Labs    08/09/22 0603 08/10/22 0544  NA 141 142  K 4.7 4.4  CL 114* 114*  CO2 20* 20*  GLUCOSE 101* 142*  BUN 85* 88*  CREATININE 2.81* 2.64*  CALCIUM 8.5* 8.3*  MG 2.3 2.3   Liver Function Tests: Recent Labs    08/09/22 0603  AST 16  ALT 18  ALKPHOS 80  BILITOT 1.6*  PROT 6.0*  ALBUMIN 3.4*   No results for input(s): "LIPASE", "AMYLASE" in the last 72 hours. CBC: Recent Labs    08/09/22 0603 08/10/22 0544  WBC 3.1* 3.1*  NEUTROABS 2.1 2.2  HGB 8.3* 8.1*  HCT 25.8* 25.1*  MCV 106.6* 105.9*  PLT 83* 85*   Cardiac Enzymes: No results for input(s): "CKTOTAL", "CKMB", "CKMBINDEX", "TROPONINI" in the last 72 hours. BNP: Invalid input(s): "POCBNP" D-Dimer: No results for input(s): "DDIMER" in the last 72 hours. Hemoglobin A1C: No results for input(s): "HGBA1C" in the last 72 hours. Fasting Lipid Panel: No results for input(s): "CHOL", "HDL", "LDLCALC", "TRIG", "CHOLHDL", "LDLDIRECT" in the last 72 hours. Thyroid Function Tests: No results for input(s): "TSH", "T4TOTAL", "T3FREE", "THYROIDAB" in the last 72 hours.  Invalid input(s): "FREET3" Anemia Panel: No results for input(s): "VITAMINB12", "FOLATE", "FERRITIN", "TIBC", "IRON", "RETICCTPCT" in the last 72 hours.   PHYSICAL EXAM General: Well developed, well nourished, in no acute distress HEENT:  Normocephalic and atramatic Neck:  No JVD.  Lungs: Clear bilaterally to auscultation and percussion. Heart: HRRR . Normal S1 and S2 without gallops or murmurs.   Abdomen: Bowel sounds are positive, abdomen soft and non-tender  Msk:  Back normal, normal gait. Normal strength and tone for age. Extremities: No clubbing, cyanosis or edema.   Neuro: Alert and oriented X 3. Psych:  Good affect, responds appropriately  TELEMETRY: AV sequential paced rhythm  ASSESSMENT AND PLAN: HFrEF with severe LV dysfunction with biventricular pacemaker/defibrillator presented with decompensated heart failure.  IV loop Lasix drip has really helped the patient and is feeling much better today.  Will start the patient on Entresto tomorrow if blood pressure remains high enough.  Principal Problem:   Acute on chronic HFrEF (heart failure with reduced ejection fraction) (HCC) Active Problems:   Thrombocytopenia (HCC)   Accelerated hypertension   CAD (coronary artery disease)   Pleural effusion on left   Type 2 diabetes mellitus with microalbuminuria, with long-term current use of insulin (HCC)   Acute kidney injury superimposed on CKD (HCC)   Acute exacerbation of CHF (congestive heart failure) (HCC)   Goals of care, counseling/discussion   Acute heart failure (HCC)   Hypervolemia   Pleural effusion    Isbella Arline A, MD, [facility] 08/10/2022 11:10 AM

## 2022-08-11 DIAGNOSIS — N179 Acute kidney failure, unspecified: Secondary | ICD-10-CM | POA: Diagnosis not present

## 2022-08-11 DIAGNOSIS — J90 Pleural effusion, not elsewhere classified: Secondary | ICD-10-CM | POA: Diagnosis not present

## 2022-08-11 DIAGNOSIS — E8770 Fluid overload, unspecified: Secondary | ICD-10-CM | POA: Diagnosis not present

## 2022-08-11 DIAGNOSIS — I5023 Acute on chronic systolic (congestive) heart failure: Secondary | ICD-10-CM | POA: Diagnosis not present

## 2022-08-11 LAB — CBC WITH DIFFERENTIAL/PLATELET
Abs Immature Granulocytes: 0.01 10*3/uL (ref 0.00–0.07)
Basophils Absolute: 0 10*3/uL (ref 0.0–0.1)
Basophils Relative: 1 %
Eosinophils Absolute: 0.1 10*3/uL (ref 0.0–0.5)
Eosinophils Relative: 2 %
HCT: 26.5 % — ABNORMAL LOW (ref 39.0–52.0)
Hemoglobin: 8.9 g/dL — ABNORMAL LOW (ref 13.0–17.0)
Immature Granulocytes: 0 %
Lymphocytes Relative: 17 %
Lymphs Abs: 0.6 10*3/uL — ABNORMAL LOW (ref 0.7–4.0)
MCH: 34.8 pg — ABNORMAL HIGH (ref 26.0–34.0)
MCHC: 33.6 g/dL (ref 30.0–36.0)
MCV: 103.5 fL — ABNORMAL HIGH (ref 80.0–100.0)
Monocytes Absolute: 0.3 10*3/uL (ref 0.1–1.0)
Monocytes Relative: 10 %
Neutro Abs: 2.4 10*3/uL (ref 1.7–7.7)
Neutrophils Relative %: 70 %
Platelets: 86 10*3/uL — ABNORMAL LOW (ref 150–400)
RBC: 2.56 MIL/uL — ABNORMAL LOW (ref 4.22–5.81)
RDW: 14.4 % (ref 11.5–15.5)
WBC: 3.4 10*3/uL — ABNORMAL LOW (ref 4.0–10.5)
nRBC: 0 % (ref 0.0–0.2)

## 2022-08-11 LAB — GLUCOSE, CAPILLARY
Glucose-Capillary: 196 mg/dL — ABNORMAL HIGH (ref 70–99)
Glucose-Capillary: 197 mg/dL — ABNORMAL HIGH (ref 70–99)
Glucose-Capillary: 157 mg/dL — ABNORMAL HIGH (ref 70–99)
Glucose-Capillary: 173 mg/dL — ABNORMAL HIGH (ref 70–99)

## 2022-08-11 LAB — RENAL FUNCTION PANEL
Albumin: 3.5 g/dL (ref 3.5–5.0)
Anion gap: 8 (ref 5–15)
BUN: 82 mg/dL — ABNORMAL HIGH (ref 8–23)
CO2: 22 mmol/L (ref 22–32)
Calcium: 8.7 mg/dL — ABNORMAL LOW (ref 8.9–10.3)
Chloride: 113 mmol/L — ABNORMAL HIGH (ref 98–111)
Creatinine, Ser: 2.38 mg/dL — ABNORMAL HIGH (ref 0.61–1.24)
GFR, Estimated: 27 mL/min — ABNORMAL LOW (ref >60)
Glucose, Bld: 121 mg/dL — ABNORMAL HIGH (ref 70–99)
Phosphorus: 4.9 mg/dL — ABNORMAL HIGH (ref 2.5–4.6)
Potassium: 4.2 mmol/L (ref 3.5–5.1)
Sodium: 143 mmol/L (ref 135–145)

## 2022-08-11 LAB — MAGNESIUM: Magnesium: 2.2 mg/dL (ref 1.7–2.4)

## 2022-08-11 MED ORDER — SACUBITRIL-VALSARTAN 24-26 MG PO TABS
1.0000 | ORAL_TABLET | Freq: Two times a day (BID) | ORAL | Status: DC
  Administered 2022-08-11 – 2022-08-13 (×5): 1 via ORAL
  Filled 2022-08-11 (×5): qty 1

## 2022-08-11 NOTE — Progress Notes (Signed)
[place], [place] 08/11/22  Subjective:   Hospital day # 2  Patient known to our practice from outpatient follow-up for CKD.  Currently admitted for volume overload.  Patient's fianc is in the room and states that he was progressively getting more edematous.  Therefore they decided to bring him in for evaluation.  They report that he does eat out and likes to eat hamburgers, hot dogs and other processed foods. Renal: 12/24 0701 - 12/25 0700 In: 34 [I.V.:34] Out: 3900 [Urine:3900] Lab Results  Component Value Date   CREATININE 2.38 (H) 08/11/2022   CREATININE 2.64 (H) 08/10/2022   CREATININE 2.81 (H) 08/09/2022   He is being treated with IV furosemide drip. At admission, creatinine was elevated, but the trend is improving as noted  Objective:  Vital signs in last 24 hours:  Temp:  [97.8 F (36.6 C)-98.5 F (36.9 C)] 97.8 F (36.6 C) (12/25 0946) Pulse Rate:  [65-77] 74 (12/25 0615) Resp:  [16-20] 19 (12/25 0946) BP: (118-140)/(63-85) 130/74 (12/25 0946) SpO2:  [96 %-100 %] 99 % (12/25 0946) Weight:  [79.8 kg] 79.8 kg (12/24 2137)  Weight change:  Filed Weights   08/10/22 2137  Weight: 79.8 kg    Intake/Output:    Intake/Output Summary (Last 24 hours) at 08/11/2022 1203 Last data filed at 08/11/2022 0600 Gross per 24 hour  Intake 33.99 ml  Output 2350 ml  Net -2316.01 ml     Physical Exam: General:  No acute distress, laying in the bed  HEENT  anicteric, moist oral mucous membrane  Pulm/lungs  normal breathing effort, lungs are clear to auscultation  CVS/Heart  regular rhythm, no rub or gallop  Abdomen:   Soft, nontender  Extremities:  2+ peripheral edema  Neurologic:  Alert, oriented, able to follow commands  Skin:  No acute rashes   Basic Metabolic Panel:  Recent Labs  Lab 08/08/22 1132 08/09/22 0603 08/10/22 0544 08/11/22 0649  NA 140 141 142 143  K 5.2* 4.7 4.4 4.2  CL 111 114* 114* 113*  CO2 21* 20* 20* 22   GLUCOSE 133* 101* 142* 121*  BUN 87* 85* 88* 82*  CREATININE 3.24* 2.81* 2.64* 2.38*  CALCIUM 8.8* 8.5* 8.3* 8.7*  MG  --  2.3 2.3 2.2  PHOS  --   --   --  4.9*      CBC: Recent Labs  Lab 08/08/22 1132 08/09/22 0603 08/10/22 0544 08/11/22 0649  WBC 4.4 3.1* 3.1* 3.4*  NEUTROABS  --  2.1 2.2 2.4  HGB 9.0* 8.3* 8.1* 8.9*  HCT 28.8* 25.8* 25.1* 26.5*  MCV 107.5* 106.6* 105.9* 103.5*  PLT 105* 83* 85* 86*       Lab Results  Component Value Date   HEPBSAG NON REACTIVE 02/12/2020   HEPBIGM NON REACTIVE 02/12/2020      Microbiology:  No results found for this or any previous visit (from the past 240 hour(s)).  Coagulation Studies: No results for input(s): "LABPROT", "INR" in the last 72 hours.  Urinalysis: No results for input(s): "COLORURINE", "LABSPEC", "PHURINE", "GLUCOSEU", "HGBUR", "BILIRUBINUR", "KETONESUR", "PROTEINUR", "UROBILINOGEN", "NITRITE", "LEUKOCYTESUR" in the last 72 hours.  Invalid input(s): "APPERANCEUR"    Imaging: ECHOCARDIOGRAM COMPLETE  Result Date: 08/10/2022    ECHOCARDIOGRAM REPORT   Patient Name:   KALUB MORILLO Date of Exam: 08/10/2022 Medical Rec #:  [id number]           Height:       73.0 in Accession #:  [id number]          Weight:       184.0 lb Date of Birth:  1945-12-13           BSA:          2.077 m Patient Age:    76 years            BP:           133/70 mmHg Patient Gender: M                   HR:           71 bpm. Exam Location:  [facility] Procedure: 2D Echo, Color Doppler and Cardiac Doppler Indications:     I50.9 Congestive Heart Failure  History:         Patient has prior history of Echocardiogram examinations, most                  recent 02/08/2020. Defibrillator, CKD and COPD; Risk                  Factors:Hypertension and Diabetes.  Sonographer:     Charmayne Sheer Referring Phys:  [place] Diagnosing Phys: [place]  1. Left ventricular ejection fraction, by estimation, is <20%. The left ventricle has  severely decreased function. The left ventricle demonstrates global hypokinesis. The left ventricular internal cavity size was severely dilated. There is moderate left  ventricular hypertrophy. Left ventricular diastolic parameters are consistent with Grade III diastolic dysfunction (restrictive).  2. Right ventricular systolic function is mildly reduced. The right ventricular size is moderately enlarged.  3. Left atrial size was severely dilated.  4. The mitral valve is normal in structure. Mild to moderate mitral valve regurgitation. No evidence of mitral stenosis.  5. Tricuspid valve regurgitation is mild to moderate.  6. The aortic valve is normal in structure. Aortic valve regurgitation is mild to moderate. No aortic stenosis is present.  7. The inferior vena cava is normal in size with greater than 50% respiratory variability, suggesting right atrial pressure of 3 mmHg. Conclusion(s)/Recommendation(s): Findings consistent with dilated cardiomyopathy. FINDINGS  Left Ventricle: Left ventricular ejection fraction, by estimation, is <20%. The left ventricle has severely decreased function. The left ventricle demonstrates global hypokinesis. The left ventricular internal cavity size was severely dilated. There is moderate left ventricular hypertrophy. Left ventricular diastolic parameters are consistent with Grade III diastolic dysfunction (restrictive). Right Ventricle: The right ventricular size is moderately enlarged. No increase in right ventricular wall thickness. Right ventricular systolic function is mildly reduced. Left Atrium: Left atrial size was severely dilated. Right Atrium: Right atrial size was normal in size. Pericardium: There is no evidence of pericardial effusion. Mitral Valve: The mitral valve is normal in structure. Mild to moderate mitral valve regurgitation. No evidence of mitral valve stenosis. Tricuspid Valve: The tricuspid valve is normal in structure. Tricuspid valve regurgitation is mild  to moderate. No evidence of tricuspid stenosis. Aortic Valve: The aortic valve is normal in structure. Aortic valve regurgitation is mild to moderate. Aortic regurgitation PHT measures 568 msec. No aortic stenosis is present. Aortic valve mean gradient measures 6.0 mmHg. Aortic valve peak gradient measures 12.7 mmHg. Aortic valve area, by VTI measures 1.94 cm. Pulmonic Valve: The pulmonic valve was normal in structure. Pulmonic valve regurgitation is trivial. No evidence of pulmonic stenosis. Aorta: The aortic root is normal in size and structure. Venous: The inferior vena cava is normal in size with  greater than 50% respiratory variability, suggesting right atrial pressure of 3 mmHg. IAS/Shunts: No atrial level shunt detected by color flow Doppler.  LEFT VENTRICLE PLAX 2D LVIDd:         6.10 cm      Diastology LVIDs:         5.70 cm      LV e' medial:    4.35 cm/s LV PW:         1.30 cm      LV E/e' medial:  22.3 LV IVS:        0.80 cm      LV e' lateral:   4.90 cm/s LVOT diam:     2.00 cm      LV E/e' lateral: 19.8 LV SV:         71 LV SV Index:   34 LVOT Area:     3.14 cm  LV Volumes (MOD) LV vol d, MOD A4C: 196.0 ml LV vol s, MOD A4C: 166.0 ml LV SV MOD A4C:     196.0 ml RIGHT VENTRICLE RV Basal diam:  4.80 cm RV S prime:     5.87 cm/s LEFT ATRIUM             Index        RIGHT ATRIUM           Index LA diam:        4.50 cm 2.17 cm/m   RA Area:     17.80 cm LA Vol (A2C):   63.3 ml 30.48 ml/m  RA Volume:   47.60 ml  22.92 ml/m LA Vol (A4C):   75.2 ml 36.21 ml/m LA Biplane Vol: 69.1 ml 33.27 ml/m  AORTIC VALVE                     PULMONIC VALVE AV Area (Vmax):    2.19 cm      PV Vmax:          1.17 m/s AV Area (Vmean):   2.03 cm      PV Vmean:         74.200 cm/s AV Area (VTI):     1.94 cm      PV VTI:           0.240 m AV Vmax:           178.00 cm/s   PV Peak grad:     5.5 mmHg AV Vmean:          118.000 cm/s  PV Mean grad:     3.0 mmHg AV VTI:            0.367 m       PR End Diast Vel: 7.40 msec AV  Peak Grad:      12.7 mmHg AV Mean Grad:      6.0 mmHg LVOT Vmax:         124.00 cm/s LVOT Vmean:        76.100 cm/s LVOT VTI:          0.227 m LVOT/AV VTI ratio: 0.62 AI PHT:            568 msec  AORTA Ao Root diam: 3.20 cm MITRAL VALVE               TRICUSPID VALVE MV Area (PHT): 3.48 cm    TR Peak grad:   38.7 mmHg MV Decel Time: 218 msec    TR Vmax:  311.00 cm/s MR Peak grad: 85.4 mmHg MR Vmax:      462.00 cm/s  SHUNTS MV E velocity: 96.90 cm/s  Systemic VTI:  0.23 m MV A velocity: 55.00 cm/s  Systemic Diam: 2.00 cm MV E/A ratio:  1.76 Shaukat Khan Electronically signed by Neoma Laming Signature Date/Time: 08/10/2022/12:25:36 PM    Final      Medications:    sodium chloride     furosemide (LASIX) 200 mg in dextrose 5 % 100 mL (2 mg/mL) infusion Stopped (08/10/22 1229)    aspirin EC  81 mg Oral Daily   heparin  5,000 Units Subcutaneous Q8H   insulin aspart  0-15 Units Subcutaneous TID WC   sacubitril-valsartan  1 tablet Oral BID   sodium chloride flush  3 mL Intravenous Q12H   sodium chloride, acetaminophen, ondansetron (ZOFRAN) IV, sodium chloride flush  Assessment/ Plan:  76 y.o. 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%, 05/2022) c/b recurrent pleural effusions s/p multiple thoracentesis s/p CRT-D, group 2 pulmonary hypertension, CKD stage IV, hypertension, type 2 diabetes, CAD s/p CABG (2011), functional diarrhea, anemia of chronic disease, thrombocytopenia,    admitted on 08/08/2022 for Shortness of breath [R06.02] Acute heart failure (HCC) [I50.9] Pleural effusion [J90] Acute exacerbation of CHF (congestive heart failure) ([place]) [I50.9] Hypervolemia, unspecified hypervolemia type [E87.70]  #Acute exacerbation of chronic systolic and diastolic CHF with lower extremity edema Currently getting IV furosemide infusion.  Patient responding well and edema is improving.  Continue Lasix infusion.  Currently at 4 mg/h. Urine output  of 3900 cc yesterday.  #Acute kidney injury Likely secondary to hemodynamic instability and cardiorenal syndrome. Serum creatinine is now improving with improved hemodynamics. 08/10/22-2D echo shows LVEF less than 20%, global hypokinesis, moderate LVH, grade 3 diastolic dysfunction, moderately enlarged right ventricle, severely dilated left atrium, mild to moderate mitral regurgitation  #Diabetic chronic kidney disease, CKD stage IV.  Followed by Dr. Holley Raring as outpatient. Baseline creatinine 2.2-2.4 with GFR around 28-30.  From October 2023. Outpatient weight of 80 kg. Serum creatinine now close to baseline.    LOS: 2 Rick Zuniga 12/25/202312:03 PM  [place], [place]  Note: This note was prepared with Dragon dictation. Any transcription errors are unintentional

## 2022-08-11 NOTE — Progress Notes (Signed)
SUBJECTIVE: Patient is breathing much better denies any chest pain and shortness of breath but states swelling of the leg is still 2+   Vitals:   08/10/22 2137 08/11/22 0140 08/11/22 0615 08/11/22 0946  BP: 135/85 124/70 120/63 130/74  Pulse: 77 77 74   Resp: 16 20 16 19   Temp: 98.1 F (36.7 C) 98.5 F (36.9 C) 98.4 F (36.9 C) 97.8 F (36.6 C)  TempSrc: Oral Oral Oral Oral  SpO2: 99% 98% 96% 99%  Weight: 79.8 kg     Height: 6\' 2"  (1.88 m)       Intake/Output Summary (Last 24 hours) at 08/11/2022 1147 Last data filed at 08/11/2022 0600 Gross per 24 hour  Intake 33.99 ml  Output 3200 ml  Net -3166.01 ml    LABS: Basic Metabolic Panel: Recent Labs    08/10/22 0544 08/11/22 0649  NA 142 143  K 4.4 4.2  CL 114* 113*  CO2 20* 22  GLUCOSE 142* 121*  BUN 88* 82*  CREATININE 2.64* 2.38*  CALCIUM 8.3* 8.7*  MG 2.3 2.2  PHOS  --  4.9*   Liver Function Tests: Recent Labs    08/09/22 0603 08/11/22 0649  AST 16  --   ALT 18  --   ALKPHOS 80  --   BILITOT 1.6*  --   PROT 6.0*  --   ALBUMIN 3.4* 3.5   No results for input(s): "LIPASE", "AMYLASE" in the last 72 hours. CBC: Recent Labs    08/10/22 0544 08/11/22 0649  WBC 3.1* 3.4*  NEUTROABS 2.2 2.4  HGB 8.1* 8.9*  HCT 25.1* 26.5*  MCV 105.9* 103.5*  PLT 85* 86*   Cardiac Enzymes: No results for input(s): "CKTOTAL", "CKMB", "CKMBINDEX", "TROPONINI" in the last 72 hours. BNP: Invalid input(s): "POCBNP" D-Dimer: No results for input(s): "DDIMER" in the last 72 hours. Hemoglobin A1C: No results for input(s): "HGBA1C" in the last 72 hours. Fasting Lipid Panel: No results for input(s): "CHOL", "HDL", "LDLCALC", "TRIG", "CHOLHDL", "LDLDIRECT" in the last 72 hours. Thyroid Function Tests: No results for input(s): "TSH", "T4TOTAL", "T3FREE", "THYROIDAB" in the last 72 hours.  Invalid input(s): "FREET3" Anemia Panel: No results for input(s): "VITAMINB12", "FOLATE", "FERRITIN", "TIBC", "IRON", "RETICCTPCT" in  the last 72 hours.   PHYSICAL EXAM General: Well developed, well nourished, in no acute distress HEENT:  Normocephalic and atramatic Neck:  No JVD.  Lungs: Clear bilaterally to auscultation and percussion. Heart: HRRR . Normal S1 and S2 without gallops or murmurs.  Abdomen: Bowel sounds are positive, abdomen soft and non-tender  Msk:  Back normal, normal gait. Normal strength and tone for age. Extremities: No clubbing, cyanosis or edema.   Neuro: Alert and oriented X 3. Psych:  Good affect, responds appropriately  TELEMETRY: AV sequential paced rhythm  ASSESSMENT AND PLAN: HFrEF with placement of defibrillator with CRT and LVEF 15 to 20% on echocardiogram on this admission.  Patient still has creatinine 2.38 and is improving with IV Lasix with significant improvement of symptoms.  Swelling of the leg is still 2+ and may consider adding Entresto.  Discussed with renal Dr. Candiss Norse and he agreed to it.  Principal Problem:   Acute on chronic HFrEF (heart failure with reduced ejection fraction) (HCC) Active Problems:   Thrombocytopenia (HCC)   Accelerated hypertension   CAD (coronary artery disease)   Pleural effusion on left   Type 2 diabetes mellitus with microalbuminuria, with long-term current use of insulin ([place])   Acute kidney injury superimposed on CKD ([place])  Acute exacerbation of CHF (congestive heart failure) (HCC)   Goals of care, counseling/discussion   Acute heart failure (HCC)   Hypervolemia   Pleural effusion    Rick Hole A, MD, [facility] 08/11/2022 11:47 AM

## 2022-08-11 NOTE — Progress Notes (Signed)
Patient admitted to the unit and placed on telemetry. A skin assessment was completed by myself and Clifton Custard. The patients skin consisted of some scattered bruising to the upper and lower extremities. The third toe on the right foot has a small scab on it. No other skin issues noted at this time.

## 2022-08-11 NOTE — Progress Notes (Signed)
PROGRESS NOTE    Rick Zuniga  HEN:[id number] DOB: 09-03-1945 DOA: 08/08/2022 PCP: [name], MD    Chief Complaint  Patient presents with   Pleural Effusion    Brief Narrative:  HPI per Dr. Marcelyn Bruins is a 76 y.o. 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   Rick Zuniga states that for the past 4 days, he has been taking the Increased dose of torsemide and spironolactone as prescribed by his pulmonologist.  Despite this, he has been urinating less often and feels that his fluid levels have not improved at all if not increased.  He endorses shortness of breath and lower extremity edema that extends up into his groin.  He any chest pain or palpitations.   Per chart review, patient had a recent visit with his pulmonologist, Dr. Lanney Gins, at which time palliative Pleurx catheter was discussed.  In addition, he was started on spironolactone and his torsemide dose was increased from 40 mg to 100 mg.  Per note, patient at that time agreed to Pleurx.  I discussed this plan with Rick Zuniga and he states that he has spoken to his other physicians, who have recommended he not have that done.   ED course: On arrival to the ED, patient was normotensive at 135/79 with heart rate of 72.  He was saturating at 100% on room air.  Initial workup remarkable for potassium of 5.2, bicarb of 21, creatinine of 3.24, with GFR 19.  Hemoglobin is 9.0 with platelets of 105.  Troponin is elevated at 44 with flat trend to 47.  EKG with atrial sensed ventricular pacing with multiple PVCs.  No ST or T wave changes concerning for acute  ischemia.  Chest x-ray with bilateral pleural effusions, small, and hyperexpansion with persistent retrocardiac collapse/consolidation.  Due to acute on chronic heart failure, patient was started on Lasix and [facility] contacted for admission.      Assessment & Plan:  Principal Problem:   Acute on chronic HFrEF (heart failure with reduced ejection fraction) (HCC) Active Problems:   Pleural effusion on left   Acute kidney injury superimposed on CKD (HCC)   Goals of care, counseling/discussion   Accelerated hypertension   Type 2 diabetes mellitus with microalbuminuria, with long-term current use of insulin (HCC)   Thrombocytopenia (HCC)   CAD (coronary artery disease)   Acute exacerbation of CHF (congestive heart failure) (HCC)   Acute heart failure (HCC)   Hypervolemia   Pleural effusion    Assessment and Plan: * Acute on chronic HFrEF (heart failure with reduced ejection fraction) ([place]) Patient presented with worsening shortness of breath and lower extremity edema extending up to the sacrum at this point despite home torsemide being increased from 40 mg daily to 100 mg daily.   -BNP markedly elevated at 3000.   -Prior echocardiogram obtained on 05/18/2022 notable for severe biventricular failure with LVEF less than 15% done at outside hospital/[facility] everywhere.  This was prior to CRT-D placement.  No repeat echocardiogram since then. -Patient started on Lasix 80 mg IV every 12 hours initially on admission with good urine output and subsequently placed on a Lasix drip..   -2D echo obtained with  a EF of < 20%, left ventricular global hypokinesis, grade 3 diastolic dysfunction, moderate LVH, mildly reduced right ventricular systolic function, moderately enlarged right ventricular size, severely dilated left atrial size, no mitral stenosis, mild to moderate TVR, no aortic stenosis noted.   -Patient seen in consultation by cardiology and patient placed on a Lasix drip with urine output of  3.9 L over the past 24 hours.   -Entresto added per cardiology. -Weight ordered but not recorded. -Strict I's and O's, daily weights. -Due to complicated cardiac history, presentation with acute on chronic CKD cardiology consulted and following and managing.    Pleural effusion on left Patient stated he was recommended to be evaluated in the ED by his pulmonologist due to shortness of breath with possible recurrence of pleural effusion.  At that time, they discussed Pleurx catheter, which he is declining at this time.  -CXR x-ray relatively unchanged from 3 weeks ago. -Continue diuresis as above for acute CHF exacerbation. -CT chest done with concerns for loculated pleural effusion. -Primary pulmonologist consulted to assess the patient and following.   -It is noted per primary pulmonologist that patient has had recurrent bilateral effusions worse on the left, numerous effusions drained via thoracentesis patient noted to have declined Pleurx catheter and pulmonary feels patient may be approaching need for HD.  Pulmonary consulted with nephrology who has assessed the patient and at this time does not feel any need for HD as patient responding well to Lasix drip. -No plans for intervention at this time per pulmonary.   -Pulmonary following.   Acute kidney injury superimposed on CKD ([place]) Creatinine on presentation elevated at 3.24 compared to baseline between 2.2 and 2.5.   -Potassium elevated at 5.2 despite being on high-dose diuretics.  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  He states that he follows with a nephrologist but he has never been told that his kidney function had declined so much. -Patient placed on IV Lasix 80 mg every 12 with urine output and assessed by cardiology and placed on a Lasix drip.  -Patient states he has had good urine output on Lasix drip, however urine output not properly recorded early on during the  hospitalization, patient with a urine output of 3.9 L over the past 24 hours and is -6.486 L during this hospitalization.   -Patient noted to be responding well to Lasix drip with creatinine trending down currently at 2.38 from 2.64 from 3.24 on admission.   -Patient seen in consultation by nephrology who recommend continuation of Lasix drip at this and no need for hemodialysis at this time and to monitor with diuresis.  -Continue to hold home regimen spironolactone and losartan. -Patient started on Entresto per cardiology. -Nephrology following.  Goals of care, counseling/discussion When approaching the topic of goals of care and CODE STATUS, Rick Zuniga and his significant other Ms. Charlynn Grimes were quite shocked and surprised to Dr. Charleen Kirks (admitting physician's) concerns regarding his severe biventricular heart failure that seems to be unresponsive to CRT-D, history of CKD stage IV and recurrent pleural effusions that have required numerous thoracentesis.  They had not considered the topic of palliative care and did not realize that the Pleurx was palliative in nature.   -Patient and his significant other seem to have a poor understanding of the severity of patient's chronic multiorgan failure.  They would like to think before a Palliative consult is placed. -Revisited with patient and significant  other about palliative care and patient and significant other at this time want to hold off on palliative care involvement at this time. -In my view patient and significant other seem to also have a poor understanding of severity of patient's chronic multiorgan failure. -Cardiology, pulmonary nephrology consulted and are following and hopefully they can shed further light into this for patient and his loved one.  -Will hold off on any palliative care consultation at this time per patient and significant other request. -Outpatient follow-up with PCP as well.   Accelerated hypertension - BP noted to be  soft on presentation and as such patient's losartan held in the setting of AKI. -Patient currently being diuresed on a Lasix drip. -Entresto added to regimen per cardiology. -Follow.   Type 2 diabetes mellitus with microalbuminuria, with long-term current use of insulin (HCC) - Hemoglobin A1c noted at 10.9  (06/08/2020 ) -Repeat hemoglobin A1c pending. -CBG 196 this morning.   -Continue to hold home regimen hypoglycemic agents.   -SSI.   Thrombocytopenia (HCC) Chronic. -Platelet count slowly trending down but seems to be plateauing and stabilizing and currently at 86.. -Patient with no overt bleeding. -Follow.  CAD (coronary artery disease) No evidence of acute EKG changes and patient is chest pain-free.  Troponin leak is likely in the setting of CKD. -Continue home regimen aspirin. -ARB on hold due to AKI and soft blood pressure. -Entresto added per cardiology today. -Per cardiology.         DVT prophylaxis: Heparin Code Status: Full Family Communication: Updated patient and fianc at bedside. Disposition: Likely home when clinically improved, cleared by cardiology and pulmonary.  Status is: Inpatient The patient will require care spanning > 2 midnights and should be moved to inpatient because: Severity of illness   Consultants:  Pulmonary: Dr.Aleskerov 08/09/2022 Cardiology: Dr. Humphrey Rolls 08/09/2022 Nephrology: Dr. Candiss Norse 08/10/2022  Procedures:  CT chest 08/09/2022 Chest x-ray 08/08/2022 2D echo 08/10/2022  Antimicrobials:  None   Subjective: Patient sitting up at the side of the bed.  Overall feeling much better.  States shortness of breath has improved.  Feels lower extremity swelling has improved.  Denies any chest pain.  States has good urine output.  Significant other at bedside.     Objective: Vitals:   08/11/22 0615 08/11/22 0946 08/11/22 1321 08/11/22 1612  BP: 120/63 130/74 133/74 108/60  Pulse: 74  85 68  Resp: 16 19 18 17   Temp: 98.4 F (36.9 C)  97.8 F (36.6 C) 97.6 F (36.4 C) 98.1 F (36.7 C)  TempSrc: Oral Oral Oral Oral  SpO2: 96% 99% 100% 97%  Weight:      Height:        Intake/Output Summary (Last 24 hours) at 08/11/2022 1649 Last data filed at 08/11/2022 1330 Gross per 24 hour  Intake 33.99 ml  Output 3800 ml  Net -3766.01 ml   Filed Weights   08/10/22 2137  Weight: 79.8 kg    Examination:  General exam: NAD. Respiratory system: Decreasing bibasilar crackles.  No wheezing.  Fair air movement.  Speaking in full sentences.  Cardiovascular system: RRR no murmurs rubs or gallops.  Positive JVD.  2+ bilateral lower extremity edema.  Gastrointestinal system: Abdomen is soft, nontender, nondistended, positive bowel sounds.  No rebound.  No guarding.  Central nervous system: Alert and oriented. No focal neurological deficits. Extremities: Symmetric 5 x 5 power. Skin: No rashes, lesions or ulcers Psychiatry: Judgement and insight appear normal. Mood & affect appropriate.  Data Reviewed:   CBC: Recent Labs  Lab 08/08/22 1132 08/09/22 0603 08/10/22 0544 08/11/22 0649  WBC 4.4 3.1* 3.1* 3.4*  NEUTROABS  --  2.1 2.2 2.4  HGB 9.0* 8.3* 8.1* 8.9*  HCT 28.8* 25.8* 25.1* 26.5*  MCV 107.5* 106.6* 105.9* 103.5*  PLT 105* 83* 85* 86*    Basic Metabolic Panel: Recent Labs  Lab 08/08/22 1132 08/09/22 0603 08/10/22 0544 08/11/22 0649  NA 140 141 142 143  K 5.2* 4.7 4.4 4.2  CL 111 114* 114* 113*  CO2 21* 20* 20* 22  GLUCOSE 133* 101* 142* 121*  BUN 87* 85* 88* 82*  CREATININE 3.24* 2.81* 2.64* 2.38*  CALCIUM 8.8* 8.5* 8.3* 8.7*  MG  --  2.3 2.3 2.2  PHOS  --   --   --  4.9*    GFR: Estimated Creatinine Clearance: 29.3 mL/min (A) (by C-G formula based on SCr of 2.38 mg/dL (H)).  Liver Function Tests: Recent Labs  Lab 08/09/22 0603 08/11/22 0649  AST 16  --   ALT 18  --   ALKPHOS 80  --   BILITOT 1.6*  --   PROT 6.0*  --   ALBUMIN 3.4* 3.5    CBG: Recent Labs  Lab 08/10/22 1628  08/10/22 2316 08/11/22 0943 08/11/22 1316 08/11/22 1614  GLUCAP 104* 129* 196* 197* 157*     No results found for this or any previous visit (from the past 240 hour(s)).       Radiology Studies: ECHOCARDIOGRAM COMPLETE  Result Date: 08/10/2022    ECHOCARDIOGRAM REPORT   Patient Name:   Rick Zuniga Date of Exam: 08/10/2022 Medical Rec #:  [id number]           Height:       73.0 in Accession #:    [id number]          Weight:       184.0 lb Date of Birth:  1945/10/01           BSA:          2.077 m Patient Age:    58 years            BP:           133/70 mmHg Patient Gender: M                   HR:           71 bpm. Exam Location:  [facility] Procedure: 2D Echo, Color Doppler and Cardiac Doppler Indications:     I50.9 Congestive Heart Failure  History:         Patient has prior history of Echocardiogram examinations, most                  recent 02/08/2020. Defibrillator, CKD and COPD; Risk                  Factors:Hypertension and Diabetes.  Sonographer:     Charmayne Sheer Referring Phys:  [place] Diagnosing Phys: [place]  1. Left ventricular ejection fraction, by estimation, is <20%. The left ventricle has severely decreased function. The left ventricle demonstrates global hypokinesis. The left ventricular internal cavity size was severely dilated. There is moderate left  ventricular hypertrophy. Left ventricular diastolic parameters are consistent with Grade III diastolic dysfunction (restrictive).  2. Right ventricular systolic function is mildly reduced. The right ventricular size is moderately enlarged.  3. Left atrial size was severely  dilated.  4. The mitral valve is normal in structure. Mild to moderate mitral valve regurgitation. No evidence of mitral stenosis.  5. Tricuspid valve regurgitation is mild to moderate.  6. The aortic valve is normal in structure. Aortic valve regurgitation is mild to moderate. No aortic stenosis is present.  7. The inferior vena cava is  normal in size with greater than 50% respiratory variability, suggesting right atrial pressure of 3 mmHg. Conclusion(s)/Recommendation(s): Findings consistent with dilated cardiomyopathy. FINDINGS  Left Ventricle: Left ventricular ejection fraction, by estimation, is <20%. The left ventricle has severely decreased function. The left ventricle demonstrates global hypokinesis. The left ventricular internal cavity size was severely dilated. There is moderate left ventricular hypertrophy. Left ventricular diastolic parameters are consistent with Grade III diastolic dysfunction (restrictive). Right Ventricle: The right ventricular size is moderately enlarged. No increase in right ventricular wall thickness. Right ventricular systolic function is mildly reduced. Left Atrium: Left atrial size was severely dilated. Right Atrium: Right atrial size was normal in size. Pericardium: There is no evidence of pericardial effusion. Mitral Valve: The mitral valve is normal in structure. Mild to moderate mitral valve regurgitation. No evidence of mitral valve stenosis. Tricuspid Valve: The tricuspid valve is normal in structure. Tricuspid valve regurgitation is mild to moderate. No evidence of tricuspid stenosis. Aortic Valve: The aortic valve is normal in structure. Aortic valve regurgitation is mild to moderate. Aortic regurgitation PHT measures 568 msec. No aortic stenosis is present. Aortic valve mean gradient measures 6.0 mmHg. Aortic valve peak gradient measures 12.7 mmHg. Aortic valve area, by VTI measures 1.94 cm. Pulmonic Valve: The pulmonic valve was normal in structure. Pulmonic valve regurgitation is trivial. No evidence of pulmonic stenosis. Aorta: The aortic root is normal in size and structure. Venous: The inferior vena cava is normal in size with greater than 50% respiratory variability, suggesting right atrial pressure of 3 mmHg. IAS/Shunts: No atrial level shunt detected by color flow Doppler.  LEFT VENTRICLE PLAX  2D LVIDd:         6.10 cm      Diastology LVIDs:         5.70 cm      LV e' medial:    4.35 cm/s LV PW:         1.30 cm      LV E/e' medial:  22.3 LV IVS:        0.80 cm      LV e' lateral:   4.90 cm/s LVOT diam:     2.00 cm      LV E/e' lateral: 19.8 LV SV:         71 LV SV Index:   34 LVOT Area:     3.14 cm  LV Volumes (MOD) LV vol d, MOD A4C: 196.0 ml LV vol s, MOD A4C: 166.0 ml LV SV MOD A4C:     196.0 ml RIGHT VENTRICLE RV Basal diam:  4.80 cm RV S prime:     5.87 cm/s LEFT ATRIUM             Index        RIGHT ATRIUM           Index LA diam:        4.50 cm 2.17 cm/m   RA Area:     17.80 cm LA Vol (A2C):   63.3 ml 30.48 ml/m  RA Volume:   47.60 ml  22.92 ml/m LA Vol (A4C):   75.2 ml 36.21 ml/m LA Biplane  Vol: 69.1 ml 33.27 ml/m  AORTIC VALVE                     PULMONIC VALVE AV Area (Vmax):    2.19 cm      PV Vmax:          1.17 m/s AV Area (Vmean):   2.03 cm      PV Vmean:         74.200 cm/s AV Area (VTI):     1.94 cm      PV VTI:           0.240 m AV Vmax:           178.00 cm/s   PV Peak grad:     5.5 mmHg AV Vmean:          118.000 cm/s  PV Mean grad:     3.0 mmHg AV VTI:            0.367 m       PR End Diast Vel: 7.40 msec AV Peak Grad:      12.7 mmHg AV Mean Grad:      6.0 mmHg LVOT Vmax:         124.00 cm/s LVOT Vmean:        76.100 cm/s LVOT VTI:          0.227 m LVOT/AV VTI ratio: 0.62 AI PHT:            568 msec  AORTA Ao Root diam: 3.20 cm MITRAL VALVE               TRICUSPID VALVE MV Area (PHT): 3.48 cm    TR Peak grad:   38.7 mmHg MV Decel Time: 218 msec    TR Vmax:        311.00 cm/s MR Peak grad: 85.4 mmHg MR Vmax:      462.00 cm/s  SHUNTS MV E velocity: 96.90 cm/s  Systemic VTI:  0.23 m MV A velocity: 55.00 cm/s  Systemic Diam: 2.00 cm MV E/A ratio:  1.76 Shaukat Khan Electronically signed by Neoma Laming Signature Date/Time: 08/10/2022/12:25:36 PM    Final         Scheduled Meds:  aspirin EC  81 mg Oral Daily   heparin  5,000 Units Subcutaneous Q8H   insulin aspart  0-15  Units Subcutaneous TID WC   sacubitril-valsartan  1 tablet Oral BID   sodium chloride flush  3 mL Intravenous Q12H   Continuous Infusions:  sodium chloride     furosemide (LASIX) 200 mg in dextrose 5 % 100 mL (2 mg/mL) infusion Stopped (08/10/22 1229)     LOS: 2 days    Time spent: 35 minutes    Irine Seal, MD [facility]   To contact the attending provider between 7A-7P or the covering provider during after hours 7P-7A, please log into the web site [email or web address] and access using universal [place] password for that web site. If you do not have the password, please call the hospital operator.  08/11/2022, 4:49 PM

## 2022-08-12 DIAGNOSIS — K5900 Constipation, unspecified: Secondary | ICD-10-CM | POA: Insufficient documentation

## 2022-08-12 DIAGNOSIS — J90 Pleural effusion, not elsewhere classified: Secondary | ICD-10-CM | POA: Diagnosis not present

## 2022-08-12 DIAGNOSIS — I5023 Acute on chronic systolic (congestive) heart failure: Secondary | ICD-10-CM | POA: Diagnosis not present

## 2022-08-12 DIAGNOSIS — N179 Acute kidney failure, unspecified: Secondary | ICD-10-CM | POA: Diagnosis not present

## 2022-08-12 DIAGNOSIS — E8770 Fluid overload, unspecified: Secondary | ICD-10-CM | POA: Diagnosis not present

## 2022-08-12 LAB — GLUCOSE, CAPILLARY
Glucose-Capillary: 142 mg/dL — ABNORMAL HIGH (ref 70–99)
Glucose-Capillary: 267 mg/dL — ABNORMAL HIGH (ref 70–99)
Glucose-Capillary: 90 mg/dL (ref 70–99)
Glucose-Capillary: 156 mg/dL — ABNORMAL HIGH (ref 70–99)

## 2022-08-12 LAB — BASIC METABOLIC PANEL
Anion gap: 11 (ref 5–15)
BUN: 79 mg/dL — ABNORMAL HIGH (ref 8–23)
CO2: 23 mmol/L (ref 22–32)
Calcium: 8.7 mg/dL — ABNORMAL LOW (ref 8.9–10.3)
Chloride: 107 mmol/L (ref 98–111)
Creatinine, Ser: 2.55 mg/dL — ABNORMAL HIGH (ref 0.61–1.24)
GFR, Estimated: 25 mL/min — ABNORMAL LOW (ref >60)
Glucose, Bld: 146 mg/dL — ABNORMAL HIGH (ref 70–99)
Potassium: 4.1 mmol/L (ref 3.5–5.1)
Sodium: 141 mmol/L (ref 135–145)

## 2022-08-12 LAB — CBC
HCT: 27.5 % — ABNORMAL LOW (ref 39.0–52.0)
Hemoglobin: 9.1 g/dL — ABNORMAL LOW (ref 13.0–17.0)
MCH: 34.1 pg — ABNORMAL HIGH (ref 26.0–34.0)
MCHC: 33.1 g/dL (ref 30.0–36.0)
MCV: 103 fL — ABNORMAL HIGH (ref 80.0–100.0)
Platelets: 97 10*3/uL — ABNORMAL LOW (ref 150–400)
RBC: 2.67 MIL/uL — ABNORMAL LOW (ref 4.22–5.81)
RDW: 14.3 % (ref 11.5–15.5)
WBC: 3.7 10*3/uL — ABNORMAL LOW (ref 4.0–10.5)
nRBC: 0 % (ref 0.0–0.2)

## 2022-08-12 LAB — HEMOGLOBIN A1C
Hgb A1c MFr Bld: 6.4 % — ABNORMAL HIGH (ref 4.8–5.6)
Mean Plasma Glucose: 137 mg/dL

## 2022-08-12 LAB — MAGNESIUM: Magnesium: 2.2 mg/dL (ref 1.7–2.4)

## 2022-08-12 MED ORDER — SENNOSIDES-DOCUSATE SODIUM 8.6-50 MG PO TABS
1.0000 | ORAL_TABLET | Freq: Two times a day (BID) | ORAL | Status: DC
  Administered 2022-08-12: 1 via ORAL
  Filled 2022-08-12 (×3): qty 1

## 2022-08-12 MED ORDER — SORBITOL 70 % SOLN
30.0000 mL | Status: AC
  Administered 2022-08-12: 30 mL via ORAL
  Filled 2022-08-12 (×2): qty 30

## 2022-08-12 NOTE — Progress Notes (Signed)
Walk test completed by primary RN as ordered; patient maintained an oxygen saturation between 96% and 97% while ambulating on room air; patient had no complaints; breathing was even and unlabored.

## 2022-08-12 NOTE — Care Management Important Message (Signed)
Important Message  Patient Details  Name: MARKAS ALDREDGE MRN: [id number] Date of Birth: [birth year]   Medicare Important Message Given:  Yes     Dannette Barbara 08/12/2022, 12:13 PM

## 2022-08-12 NOTE — Progress Notes (Signed)
[facility] Cardiology    SUBJECTIVE: Patient states improved dyspnea shortness of breath   Vitals:   08/11/22 2019 08/11/22 2330 08/12/22 0353 08/12/22 0749  BP: 111/61 118/60 (!) 119/53 107/61  Pulse: 72 72 82 79  Resp: 16 16 16 17   Temp: 98.4 F (36.9 C) 98.5 F (36.9 C) 98 F (36.7 C) 97.8 F (36.6 C)  TempSrc: Oral Oral Oral Oral  SpO2: 98% 98% 98% 97%  Weight:   80.1 kg   Height:         Intake/Output Summary (Last 24 hours) at 08/12/2022 1040 Last data filed at 08/12/2022 0910 Gross per 24 hour  Intake 483 ml  Output 3900 ml  Net -3417 ml      PHYSICAL EXAM  General: Well developed, well nourished, in no acute distress HEENT:  Normocephalic and atramatic Neck:  No JVD.  Lungs: Clear bilaterally to auscultation and percussion. Heart: HRRR . Normal S1 and S2 without gallops or murmurs.  Abdomen: Bowel sounds are positive, abdomen soft and non-tender  Msk:  Back normal, normal gait. Normal strength and tone for age. Extremities: No clubbing, cyanosis or edema.   Neuro: Alert and oriented X 3. Psych:  Good affect, responds appropriately   LABS: Basic Metabolic Panel: Recent Labs    08/11/22 0649 08/12/22 0536  NA 143 141  K 4.2 4.1  CL 113* 107  CO2 22 23  GLUCOSE 121* 146*  BUN 82* 79*  CREATININE 2.38* 2.55*  CALCIUM 8.7* 8.7*  MG 2.2 2.2  PHOS 4.9*  --    Liver Function Tests: Recent Labs    08/11/22 0649  ALBUMIN 3.5   No results for input(s): "LIPASE", "AMYLASE" in the last 72 hours. CBC: Recent Labs    08/10/22 0544 08/11/22 0649 08/12/22 0536  WBC 3.1* 3.4* 3.7*  NEUTROABS 2.2 2.4  --   HGB 8.1* 8.9* 9.1*  HCT 25.1* 26.5* 27.5*  MCV 105.9* 103.5* 103.0*  PLT 85* 86* 97*   Cardiac Enzymes: No results for input(s): "CKTOTAL", "CKMB", "CKMBINDEX", "TROPONINI" in the last 72 hours. BNP: Invalid input(s): "POCBNP" D-Dimer: No results for input(s): "DDIMER" in the last 72 hours. Hemoglobin A1C: Recent Labs    08/10/22 0544   HGBA1C 6.4*   Fasting Lipid Panel: No results for input(s): "CHOL", "HDL", "LDLCALC", "TRIG", "CHOLHDL", "LDLDIRECT" in the last 72 hours. Thyroid Function Tests: No results for input(s): "TSH", "T4TOTAL", "T3FREE", "THYROIDAB" in the last 72 hours.  Invalid input(s): "FREET3" Anemia Panel: No results for input(s): "VITAMINB12", "FOLATE", "FERRITIN", "TIBC", "IRON", "RETICCTPCT" in the last 72 hours.  No results found.   Echo failure present ventricular function less than 25%  TELEMETRY: Paced rhythm at 70:  ASSESSMENT AND PLAN:  Principal Problem:   Acute on chronic HFrEF (heart failure with reduced ejection fraction) (HCC) Active Problems:   Thrombocytopenia (HCC)   Accelerated hypertension   CAD (coronary artery disease)   Pleural effusion on left   Type 2 diabetes mellitus with microalbuminuria, with long-term current use of insulin (HCC)   Acute kidney injury superimposed on CKD (HCC)   Acute exacerbation of CHF (congestive heart failure) (HCC)   Goals of care, counseling/discussion   Acute heart failure (HCC)   Hypervolemia   Pleural effusion    Plan Acute on chronic systolic congestive heart failure significant improvement on Lasix drip and CRT-D continue current management increase activity hopefully patient is nearing discharge Diabetes type 2 continue diabetes management and control Acute renal insufficiency on chronic renal insufficiency continue  current therapy and agree with nephrology input 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

## 2022-08-12 NOTE — Assessment & Plan Note (Signed)
-  Sorbitol every 3 hours x 2 doses. -Place on Senokot-S twice daily.

## 2022-08-12 NOTE — Progress Notes (Signed)
[place], [place] 08/12/22  Subjective:   Hospital day # 3  Patient known to our practice from outpatient follow-up for CKD.  Currently admitted for volume overload.  Patient's fianc is in the room and states that he was progressively getting more edematous.  Therefore they decided to bring him in for evaluation.  They report that he does eat out and likes to eat hamburgers, hot dogs and other processed foods. Renal: 12/25 0701 - 12/26 0700 In: 89 [P.O.:480; I.V.:3] Out: 4450 [Urine:4450] Lab Results  Component Value Date   CREATININE 2.55 (H) 08/12/2022   CREATININE 2.38 (H) 08/11/2022   CREATININE 2.64 (H) 08/10/2022   Alert and oriented Seated at bedside eating breakfast Room air Lower extremity edema remains  Objective:  Vital signs in last 24 hours:  Temp:  [97.2 F (36.2 C)-98.5 F (36.9 C)] 97.2 F (36.2 C) (12/26 1132) Pulse Rate:  [68-85] 78 (12/26 1132) Resp:  [16-18] 17 (12/26 1132) BP: (107-133)/(53-74) 119/68 (12/26 1132) SpO2:  [97 %-100 %] 98 % (12/26 1132) Weight:  [80.1 kg] 80.1 kg (12/26 0353)  Weight change: 0.267 kg Filed Weights   08/10/22 2137 08/12/22 0353  Weight: 79.8 kg 80.1 kg    Intake/Output:    Intake/Output Summary (Last 24 hours) at 08/12/2022 1141 Last data filed at 08/12/2022 1131 Gross per 24 hour  Intake 483 ml  Output 4500 ml  Net -4017 ml     Physical Exam: General:  No acute distress, laying in the bed  HEENT  anicteric, moist oral mucous membrane  Pulm/lungs  normal breathing effort, lungs are clear to auscultation  CVS/Heart  regular rhythm, no rub or gallop  Abdomen:   Soft, nontender  Extremities:  2+ peripheral edema  Neurologic:  Alert, oriented, able to follow commands  Skin:  No acute rashes   Basic Metabolic Panel:  Recent Labs  Lab 08/08/22 1132 08/09/22 0603 08/10/22 0544 08/11/22 0649 08/12/22 0536  NA 140 141 142 143 141  K 5.2* 4.7 4.4 4.2 4.1  CL 111 114*  114* 113* 107  CO2 21* 20* 20* 22 23  GLUCOSE 133* 101* 142* 121* 146*  BUN 87* 85* 88* 82* 79*  CREATININE 3.24* 2.81* 2.64* 2.38* 2.55*  CALCIUM 8.8* 8.5* 8.3* 8.7* 8.7*  MG  --  2.3 2.3 2.2 2.2  PHOS  --   --   --  4.9*  --       CBC: Recent Labs  Lab 08/08/22 1132 08/09/22 0603 08/10/22 0544 08/11/22 0649 08/12/22 0536  WBC 4.4 3.1* 3.1* 3.4* 3.7*  NEUTROABS  --  2.1 2.2 2.4  --   HGB 9.0* 8.3* 8.1* 8.9* 9.1*  HCT 28.8* 25.8* 25.1* 26.5* 27.5*  MCV 107.5* 106.6* 105.9* 103.5* 103.0*  PLT 105* 83* 85* 86* 97*       Lab Results  Component Value Date   HEPBSAG NON REACTIVE 02/12/2020   HEPBIGM NON REACTIVE 02/12/2020      Microbiology:  No results found for this or any previous visit (from the past 240 hour(s)).  Coagulation Studies: No results for input(s): "LABPROT", "INR" in the last 72 hours.  Urinalysis: No results for input(s): "COLORURINE", "LABSPEC", "PHURINE", "GLUCOSEU", "HGBUR", "BILIRUBINUR", "KETONESUR", "PROTEINUR", "UROBILINOGEN", "NITRITE", "LEUKOCYTESUR" in the last 72 hours.  Invalid input(s): "APPERANCEUR"    Imaging: No results found.   Medications:    sodium chloride     furosemide (LASIX) 200 mg in dextrose 5 % 100 mL (2 mg/mL) infusion  4 mg/hr (08/11/22 2146)    aspirin EC  81 mg Oral Daily   heparin  5,000 Units Subcutaneous Q8H   insulin aspart  0-15 Units Subcutaneous TID WC   sacubitril-valsartan  1 tablet Oral BID   senna-docusate  1 tablet Oral BID   sodium chloride flush  3 mL Intravenous Q12H   sorbitol  30 mL Oral Q3H   sodium chloride, acetaminophen, ondansetron (ZOFRAN) IV, sodium chloride flush  Assessment/ Plan:  76 y.o. 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%, 05/2022) c/b recurrent pleural effusions s/p multiple thoracentesis s/p CRT-D, group 2 pulmonary hypertension, CKD stage IV, hypertension, type 2 diabetes, CAD s/p CABG (2011), functional  diarrhea, anemia of chronic disease, thrombocytopenia,    admitted on 08/08/2022 for Shortness of breath [R06.02] Acute heart failure (HCC) [I50.9] Pleural effusion [J90] Acute exacerbation of CHF (congestive heart failure) ([place]) [I50.9] Hypervolemia, unspecified hypervolemia type [E87.70]  #Acute exacerbation of chronic systolic and diastolic CHF with lower extremity edema Receiving IV furosemide drip. Fluid status acceptable. Recommend discharge with Torsemide 40mg  twice daily.   #Acute kidney injury Likely secondary to hemodynamic instability and cardiorenal syndrome. Creatinine 2.55 now with UOP 4.4L.  08/10/22-2D echo shows LVEF less than 20%, global hypokinesis, moderate LVH, grade 3 diastolic dysfunction, moderately enlarged right ventricle, severely dilated left atrium, mild to moderate mitral regurgitation  #Diabetic chronic kidney disease, CKD stage IV.  Followed by Dr. Holley Raring as outpatient. Baseline creatinine 2.2-2.4 with GFR around 28-30.  From October 2023. Outpatient weight of 80 kg.   LOS: [place] 12/26/202311:41 [place], [address]

## 2022-08-12 NOTE — Progress Notes (Addendum)
PROGRESS NOTE    Rick Zuniga  HEN:[id number] DOB: 1946/07/09 DOA: 08/08/2022 PCP: [name], MD    Chief Complaint  Patient presents with   Pleural Effusion    Brief Narrative:  HPI per Dr. Marcelyn Zuniga is a 76 y.o. 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   Rick Zuniga states that for the past 4 days, he has been taking the Increased dose of torsemide and spironolactone as prescribed by his pulmonologist.  Despite this, he has been urinating less often and feels that his fluid levels have not improved at all if not increased.  He endorses shortness of breath and lower extremity edema that extends up into his groin.  He any chest pain or palpitations.   Per chart review, patient had a recent visit with his pulmonologist, Rick Zuniga, at which time palliative Pleurx catheter was discussed.  In addition, he was started on spironolactone and his torsemide dose was increased from 40 mg to 100 mg.  Per note, patient at that time agreed to Pleurx.  I discussed this plan with Rick Zuniga and he states that he has spoken to his other physicians, who have recommended he not have that done.   ED course: On arrival to the ED, patient was normotensive at 135/79 with heart rate of 72.  He was saturating at 100% on room air.  Initial workup remarkable for potassium of 5.2, bicarb of 21, creatinine of 3.24, with GFR 19.  Hemoglobin is 9.0 with platelets of 105.  Troponin is elevated at 44 with flat trend to 47.  EKG with atrial sensed ventricular pacing with multiple PVCs.  No ST or T wave changes concerning for acute  ischemia.  Chest x-ray with bilateral pleural effusions, small, and hyperexpansion with persistent retrocardiac collapse/consolidation.  Due to acute on chronic heart failure, patient was started on Lasix and [facility] contacted for admission.      Assessment & Plan:  Principal Problem:   Acute on chronic HFrEF (heart failure with reduced ejection fraction) (HCC) Active Problems:   Pleural effusion on left   Acute kidney injury superimposed on CKD (HCC)   Goals of care, counseling/discussion   Accelerated hypertension   Type 2 diabetes mellitus with microalbuminuria, with long-term current use of insulin (HCC)   Thrombocytopenia (HCC)   CAD (coronary artery disease)   Acute exacerbation of CHF (congestive heart failure) (HCC)   Acute heart failure (HCC)   Hypervolemia   Pleural effusion   Constipation    Assessment and Plan: * Acute on chronic HFrEF (heart failure with reduced ejection fraction) ([place]) Patient presented with worsening shortness of breath and lower extremity edema extending up to the sacrum at this point despite home torsemide being increased from 40 mg daily to 100 mg daily on admission.   -BNP markedly elevated at 3000.   -Prior echocardiogram obtained on 05/18/2022 notable for severe biventricular failure with LVEF less than 15% done at outside hospital/[facility] everywhere.  This was prior to CRT-D placement.  No repeat echocardiogram since then. -Patient started on Lasix 80 mg IV every 12 hours initially on admission with good urine output and subsequently placed on a Lasix drip per  cardiology..   -2D echo obtained with a EF of < 20%, left ventricular global hypokinesis, grade 3 diastolic dysfunction, moderate LVH, mildly reduced right ventricular systolic function, moderately enlarged right ventricular size, severely dilated left atrial size, no mitral stenosis, mild to moderate TVR, no aortic stenosis noted.   -Patient seen in consultation by cardiology and patient  placed on a Lasix drip with urine output of 4.450 L over the past 24 hours.  -Patient is -12.5 L during this hospitalization. -Entresto added per cardiology. -Weight currently 176.59 pounds from 176 pounds on 08/10/2022, doubt accuracy of weights as patient has had good urine output and clinical improvement and is -12.5 L during this hospitalization.  -Strict I's and O's, daily weights. -Due to complicated cardiac history, presentation with acute on chronic CKD cardiology consulted and following and managing volume status.    Pleural effusion on left Patient stated he was recommended to be evaluated in the ED by his pulmonologist due to shortness of breath with possible recurrence of pleural effusion.  At that time, they discussed Pleurx catheter, which he is declining at this time.  -CXR x-ray relatively unchanged from 3 weeks ago. -Continue diuresis as above for acute CHF exacerbation. -CT chest done with concerns for loculated pleural effusion. -Primary pulmonologist consulted to assess the patient and following.   -It is noted per primary pulmonologist that patient has had recurrent bilateral effusions worse on the left, numerous effusions drained via thoracentesis patient noted to have declined Pleurx catheter and pulmonary feels patient may be approaching need for HD.  Pulmonary consulted with nephrology who has assessed the patient and at this time does not feel any need for HD as patient responding well to Lasix drip. -No plans for intervention at this time per pulmonary.   -Pulmonary recommending on discharge spironolactone 50 mg daily and torsemide 40 mg daily. -Pulmonary following.  -Will need outpatient follow-up with pulmonary.  Acute kidney injury superimposed on CKD (Rick Zuniga) Creatinine on presentation elevated at 3.24 compared to baseline between 2.2 and 2.5.   -Potassium elevated at 5.2 despite being on high-dose diuretics.  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    Patient  was shocked to hear that he had CKD stage IV.  He states that he follows with a nephrologist but he has never been told that his kidney function had declined so much. -Patient placed on IV Lasix 80 mg every 12 with urine output and assessed by cardiology and placed on a Lasix drip.  -Patient states he has had good urine output on Lasix drip, however urine output not properly recorded early on during the hospitalization, patient with a urine output of 4.450 L over the past 24 hours and is - 12.55 L during this hospitalization.   -Patient noted to be responding well to Lasix drip with creatinine trending down currently at 2.55 from 2.38 from 2.64 from 3.24 on admission.   -Patient seen in consultation by nephrology who recommend continuation of Lasix drip at this and no need for hemodialysis at this time and to monitor with diuresis.  -Continue to hold home regimen spironolactone and losartan. -Patient started on Entresto per cardiology. -Nephrology following.  Goals of care, counseling/discussion When approaching the topic of goals of care and CODE STATUS, [name] and his significant other Ms. Charlynn Grimes were quite shocked and surprised to Dr. Charleen Kirks (admitting physician's) concerns regarding his severe biventricular heart failure that seems to be unresponsive to CRT-D, history of CKD stage IV and recurrent pleural effusions that  have required numerous thoracentesis.  They had not considered the topic of palliative care and did not realize that the Pleurx was palliative in nature.   -Patient and his significant other seem to have a poor understanding of the severity of patient's chronic multiorgan failure.  They would like to think before a Palliative consult is placed. -Revisited with patient and significant other about palliative care and patient and significant other at this time want to hold off on palliative care involvement at this time. -In my view patient and significant other seem to also  have a poor understanding of severity of patient's chronic multiorgan failure. -Cardiology, pulmonary nephrology consulted and are following and hopefully they can shed further light into this for patient and his loved one.  -Will hold off on any palliative care consultation at this time per patient and significant other request. -Outpatient follow-up with PCP as well.   Accelerated hypertension - BP noted to be soft on presentation and as such patient's losartan held in the setting of AKI. -Patient currently being diuresed on a Lasix drip. -Entresto added to regimen per cardiology. -Follow.   Type 2 diabetes mellitus with microalbuminuria, with long-term current use of insulin (HCC) - Hemoglobin A1c noted at 10.9  (06/08/2020 ) -Repeat hemoglobin A1c 6.4. -CBG 142 this morning.   -Continue to hold home regimen hypoglycemic agents.   -SSI.   Thrombocytopenia (HCC) Chronic. -Platelet count slowly trending down but seemed initially to be plateauing but seems to be trending back up currently at 97.  -Patient with no overt bleeding. -Follow.  CAD (coronary artery disease) No evidence of acute EKG changes and patient is chest pain-free.  Troponin leak is likely in the setting of CKD. -Continue home regimen aspirin. -ARB on hold due to AKI and soft blood pressure. -Entresto added per cardiology. -Monitor renal function. -Per cardiology.  Constipation -Sorbitol every 3 hours x 2 doses. -Place on Senokot-S twice daily.         DVT prophylaxis: Heparin Code Status: Full Family Communication: Updated patient and fianc at bedside. Disposition: Likely home when clinically improved, on oral diuretics, cleared by cardiology and pulmonary, hopefully in the next 24 to 48 hours.  Status is: Inpatient The patient will require care spanning > 2 midnights and should be moved to inpatient because: Severity of illness   Consultants:  Pulmonary: RickAleskerov 08/09/2022 Cardiology: Dr.  Humphrey Rolls 08/09/2022 Nephrology: Dr. Candiss Norse 08/10/2022  Procedures:  CT chest 08/09/2022 Chest x-ray 08/08/2022 2D echo 08/10/2022  Antimicrobials:  None   Subjective: Patient sitting on the side of the bed.  Fianc at bedside.  States shortness of breath improving daily.  States swelling improving.  No chest pain.  Hoping to be able to be discharged today.    Objective: Vitals:   08/12/22 0749 08/12/22 1132 08/12/22 1608 08/12/22 2003  BP: 107/61 119/68 138/82 115/86  Pulse: 79 78 89 88  Resp: 17 17 17 19   Temp: 97.8 F (36.6 C) (!) 97.2 F (36.2 C) 97.7 F (36.5 C) 98.1 F (36.7 C)  TempSrc: Oral  Oral Oral  SpO2: 97% 98% 98% 99%  Weight:      Height:        Intake/Output Summary (Last 24 hours) at 08/12/2022 2045 Last data filed at 08/12/2022 1935 Gross per 24 hour  Intake 483 ml  Output 5100 ml  Net -4617 ml   Filed Weights   08/10/22 2137 08/12/22 0353  Weight: 79.8 kg 80.1 kg    Examination:  General exam: NAD. Respiratory system: Decreased bibasilar crackles.  No wheezing.  Fair air movement.  Speaking in full sentences.   Cardiovascular system: Regular rate rhythm no murmurs rubs or gallops.  Positive JVD.  1-2+ bilateral lower extremity edema.  Gastrointestinal system: Abdomen is soft, nontender, nondistended, positive bowel sounds.  No rebound.  No guarding.   Central nervous system: Alert and oriented. No focal neurological deficits. Extremities: Symmetric 5 x 5 power. Skin: No rashes, lesions or ulcers Psychiatry: Judgement and insight appear normal. Mood & affect appropriate.     Data Reviewed:   CBC: Recent Labs  Lab 08/08/22 1132 08/09/22 0603 08/10/22 0544 08/11/22 0649 08/12/22 0536  WBC 4.4 3.1* 3.1* 3.4* 3.7*  NEUTROABS  --  2.1 2.2 2.4  --   HGB 9.0* 8.3* 8.1* 8.9* 9.1*  HCT 28.8* 25.8* 25.1* 26.5* 27.5*  MCV 107.5* 106.6* 105.9* 103.5* 103.0*  PLT 105* 83* 85* 86* 97*    Basic Metabolic Panel: Recent Labs  Lab 08/08/22 1132  08/09/22 0603 08/10/22 0544 08/11/22 0649 08/12/22 0536  NA 140 141 142 143 141  K 5.2* 4.7 4.4 4.2 4.1  CL 111 114* 114* 113* 107  CO2 21* 20* 20* 22 23  GLUCOSE 133* 101* 142* 121* 146*  BUN 87* 85* 88* 82* 79*  CREATININE 3.24* 2.81* 2.64* 2.38* 2.55*  CALCIUM 8.8* 8.5* 8.3* 8.7* 8.7*  MG  --  2.3 2.3 2.2 2.2  PHOS  --   --   --  4.9*  --     GFR: Estimated Creatinine Clearance: 27.5 mL/min (A) (by C-G formula based on SCr of 2.55 mg/dL (H)).  Liver Function Tests: Recent Labs  Lab 08/09/22 0603 08/11/22 0649  AST 16  --   ALT 18  --   ALKPHOS 80  --   BILITOT 1.6*  --   PROT 6.0*  --   ALBUMIN 3.4* 3.5    CBG: Recent Labs  Lab 08/11/22 2106 08/12/22 0750 08/12/22 1134 08/12/22 1607 08/12/22 2030  GLUCAP 173* 142* 267* 90 156*     No results found for this or any previous visit (from the past 240 hour(s)).       Radiology Studies: No results found.      Scheduled Meds:  aspirin EC  81 mg Oral Daily   heparin  5,000 Units Subcutaneous Q8H   insulin aspart  0-15 Units Subcutaneous TID WC   sacubitril-valsartan  1 tablet Oral BID   senna-docusate  1 tablet Oral BID   sodium chloride flush  3 mL Intravenous Q12H   Continuous Infusions:  sodium chloride     furosemide (LASIX) 200 mg in dextrose 5 % 100 mL (2 mg/mL) infusion 4 mg/hr (08/11/22 2146)     LOS: 3 days    Time spent: 35 minutes    Irine Seal, MD [facility]   To contact the attending provider between 7A-7P or the covering provider during after hours 7P-7A, please log into the web site [email or web address] and access using universal [place] password for that web site. If you do not have the password, please call the hospital operator.  08/12/2022, 8:45 PM

## 2022-08-12 NOTE — Progress Notes (Signed)
PULMONOLOGY         Date: 08/12/2022,   MRN# [id number] Rick Zuniga [birth year]     AdmissionWeight: 79.8 kg                 CurrentWeight: 80.1 kg  Referring provider: Dr Charleen Kirks   CHIEF COMPLAINT:   Acute on chronic hypoxemic respiratory failure   HISTORY OF PRESENT ILLNESS    HPI: Rick Zuniga is a 76 y.o. 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  Rick Zuniga states that for the past 4 days, he has been taking the Increased dose of torsemide and spironolactone as prescribed.  Despite this, he has been urinating less often and feels that his fluid levels have not improved at all if not increased.  He endorses shortness of breath and lower extremity edema that extends up into his groin.  He any chest pain or palpitations. On arrival to the ED, patient was normotensive at 135/79 with heart rate of 72.  He was saturating at 100% on room air.  Initial workup remarkable for potassium of 5.2, bicarb of 21, creatinine of 3.24, with GFR 19.  Hemoglobin is 9.0 with platelets of 105.  Troponin is elevated at 44 with flat trend to 47.  Patient report diarreah. He reports dribbling of urine a home. 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   08/10/22- patient is urinating heavily and is breathing better with less swelling. His BP is ok without hypotension. He had derm surgery on right face and required tylenol few times but no other pain anywhere. He's eating and going to bathroom normally. S/p nephrology evaluation, renal function is improving.   08/12/22-  patient is net 10L negative. He is feeling much improved and less dyspneic. He is  making adequate urine at this time. He has Rick Zuniga (his girlfriend) with him and she is taking care of him.  PAST MEDICAL HISTORY   Past Medical History:  Diagnosis Date   AICD (automatic cardioverter/defibrillator) present    Arthritis    Cataract    Mixed OU   CHF (congestive heart failure) ([place])    Chronic kidney disease    Complication of anesthesia    Difficulty inserting IV, wants expert at IV's, no multiple attempts   COPD (chronic obstructive pulmonary disease) ([place])    COVID-19 05/23/2022   Diabetes ([place])    Type 2   Diabetic retinopathy (HCC)    NPDR OU   Heart disease    Hypertension    Hypertensive retinopathy    OU   Indigestion    Presence of permanent cardiac pacemaker    Recurrent pleural effusion on left    Thrombocytopenia (HCC)    Wears dentures    full upper and lower     SURGICAL HISTORY   Past Surgical History:  Procedure Laterality Date   ANTERIOR VITRECTOMY Right 07/02/2022   Procedure: ANTERIOR VITRECTOMY;  Surgeon: Leandrew Koyanagi, MD;  Location: [place];  Service: Ophthalmology;  Laterality: Right;   APPENDECTOMY     BI-VENTRICULAR IMPLANTABLE CARDIOVERTER DEFIBRILLATOR  (CRT-D)  05/20/2022   CARDIAC CATHETERIZATION Right 04/16/2022   CARDIAC SURGERY  12/21/2009   bypass. 4 vessel   CATARACT  EXTRACTION W/PHACO Left 08/07/2021   Procedure: CATARACT EXTRACTION PHACO AND INTRAOCULAR LENS PLACEMENT (IOC) LEFT DIABETIC 9.06 01:42.2;  Surgeon: Leandrew Koyanagi, MD;  Location: [place];  Service: Ophthalmology;  Laterality: Left;  Diabetic   CATARACT EXTRACTION W/PHACO Right 07/02/2022   Procedure: CATARACT EXTRACTION PHACO AND INTRAOCULAR LENS PLACEMENT (IOC) RIGHT VISION BLUE HEALON 5 DIABETIC  10.62  01:16.7;  Surgeon: Leandrew Koyanagi, MD;  Location: [place];  Service: Ophthalmology;  Laterality: Right;   CATARACT EXTRACTION W/PHACO Right 07/16/2022   Procedure: REMOVAL OF LENS FRAMENTS RIGHT  DIABETIC;  Surgeon: Leandrew Koyanagi, MD;  Location: [place];  Service: Ophthalmology;  Laterality: Right;  patient wants last   CHOLECYSTECTOMY     COLONOSCOPY  12/23/2010   IR THORACENTESIS ASP PLEURAL SPACE W/IMG GUIDE  07/18/2022   THORACENTESIS     02/25/21, 10/17/21     FAMILY HISTORY   Family History  Problem Relation Age of Onset   CAD Mother    Kidney failure Father    Diabetes Father      SOCIAL HISTORY   Social History   Tobacco Use   Smoking status: Never   Smokeless tobacco: Never  Vaping Use   Vaping Use: Never used  Substance Use Topics   Alcohol use: No   Drug use: No     MEDICATIONS    Home Medication:     Current Medication:  Current Facility-Administered Medications:    0.9 %  sodium chloride infusion, 250 mL, Intravenous, PRN, Jose Persia, MD   acetaminophen (TYLENOL) tablet 650 mg, 650 mg, Oral, Q4H PRN, Jose Persia, MD, 650 mg at 08/11/22 2143   aspirin EC tablet 81 mg, 81 mg, Oral, Daily, Jose Persia, MD, 81 mg at 08/12/22 0810   furosemide (LASIX) 200 mg in dextrose 5 % 100 mL (2 mg/mL) infusion, 4 mg/hr, Intravenous, Continuous, Neoma Laming A, MD, Last Rate: 2 mL/hr at 08/11/22 2146, 4 mg/hr at 08/11/22 2146   heparin injection 5,000 Units, 5,000 Units, Subcutaneous, Q8H, Jose Persia, MD, 5,000 Units at 08/12/22 0538   insulin aspart (novoLOG) injection 0-15 Units, 0-15 Units, Subcutaneous, TID WC, Jose Persia, MD, 2 Units at 08/12/22 0809   ondansetron (ZOFRAN) injection 4 mg, 4 mg, Intravenous, Q6H PRN, Jose Persia, MD   sacubitril-valsartan (ENTRESTO) 24-26 mg per tablet, 1 tablet, Oral, BID, Neoma Laming A, MD, 1 tablet at 08/12/22 0814   sodium chloride flush (NS) 0.9 % injection 3 mL, 3 mL, Intravenous, Q12H, Jose Persia, MD, 3 mL at 08/11/22 2147   sodium chloride flush (NS) 0.9 % injection 3 mL, 3 mL, Intravenous, PRN, Jose Persia, MD    ALLERGIES   Pioglitazone, Furosemide,  Metformin and related, Prednisone, Atorvastatin, Benazepril, Latex, Other, and Tape     REVIEW OF SYSTEMS    Review of Systems:  Gen:  Denies  fever, sweats, chills weigh loss  HEENT: Denies blurred vision, double vision, ear pain, eye pain, hearing loss, nose bleeds, sore throat Cardiac:  No dizziness, chest pain or heaviness, chest tightness,edema Resp:   reports dyspnea chronically  Gi: Denies swallowing difficulty, stomach pain, nausea or vomiting, diarrhea, constipation, bowel incontinence Gu:  Denies bladder incontinence, burning urine Ext:   Denies Joint pain, stiffness or swelling Skin: Denies  skin rash, easy bruising or bleeding or hives Endoc:  Denies polyuria, polydipsia , polyphagia or weight change Psych:   Denies depression, insomnia or hallucinations   Other:  All other systems negative   VS: BP 107/61 (  BP Location: Right Arm)   Pulse 79   Temp 97.8 F (36.6 C) (Oral)   Resp 17   Ht 6\' 2"  (1.88 m)   Wt 80.1 kg   SpO2 97%   BMI 22.67 kg/m      PHYSICAL EXAM    GENERAL:NAD, no fevers, chills, no weakness no fatigue HEAD: Normocephalic, atraumatic.  EYES: Pupils equal, round, reactive to light. Extraocular muscles intact. No scleral icterus.  MOUTH: Moist mucosal membrane. Dentition intact. No abscess noted.  EAR, NOSE, THROAT: Clear without exudates. No external lesions.  NECK: Supple. No thyromegaly. No nodules. No JVD.  PULMONARY: decreased breath sounds with mild rhonchi worse at bases bilaterally.  CARDIOVASCULAR: S1 and S2. Regular rate and rhythm. No murmurs, rubs, or gallops. No edema. Pedal pulses 2+ bilaterally.  GASTROINTESTINAL: Soft, nontender, nondistended. No masses. Positive bowel sounds. No hepatosplenomegaly.  MUSCULOSKELETAL: No swelling, clubbing, or edema. Range of motion full in all extremities.  NEUROLOGIC: Cranial nerves II through XII are intact. No gross focal neurological deficits. Sensation intact. Reflexes intact.  SKIN: No  ulceration, lesions, rashes, or cyanosis. Skin warm and dry. Turgor intact.  PSYCHIATRIC: Mood, affect within normal limits. The patient is awake, alert and oriented x 3. Insight, judgment intact.       IMAGING   @IMAGES @   ASSESSMENT/PLAN   Acute on chronic hypoxemic respiratory failure -Due to severe and advanced CHF with resultant cardiorenal failure and diabetic nephropathy -recurrent bilateral effusions worse on left.  -patient has had numerous effusions drained via thoracentesis, he has declined pleurX cather -he is approaching need for HD -his fluid balance has not been documented accurately as he shares he has been urinating very aggreesively -patient is interval stable with net negative 10L   Thank you for allowing me to participate in the care of this patient.   Patient/Family are satisfied with care plan and all questions have been answered.    Provider disclosure: Patient with at least one acute or chronic illness or injury that poses a threat to life or bodily function and is being managed actively during this encounter.  All of the below services have been performed independently by signing provider:  review of prior documentation from internal and or external health records.  Review of previous and current lab results.  Interview and comprehensive assessment during patient visit today. Review of current and previous chest radiographs/CT scans. Discussion of management and test interpretation with health care team and patient/family.   This document was prepared using Dragon voice recognition software and may include unintentional dictation errors.     Ottie Glazier, M.D.  Division of Pulmonary & Critical Care Medicine

## 2022-08-12 NOTE — Progress Notes (Signed)
SUBJECTIVE: swelling of legs almost gone, less SOB   Vitals:   08/12/22 0353 08/12/22 0749 08/12/22 1132 08/12/22 1608  BP: (!) 119/53 107/61 119/68 138/82  Pulse: 82 79 78 89  Resp: 16 17 17 17   Temp: 98 F (36.7 C) 97.8 F (36.6 C) (!) 97.2 F (36.2 C) 97.7 F (36.5 C)  TempSrc: Oral Oral  Oral  SpO2: 98% 97% 98% 98%  Weight: 80.1 kg     Height:        Intake/Output Summary (Last 24 hours) at 08/12/2022 1627 Last data filed at 08/12/2022 1131 Gross per 24 hour  Intake 483 ml  Output 3900 ml  Net -3417 ml    LABS: Basic Metabolic Panel: Recent Labs    08/11/22 0649 08/12/22 0536  NA 143 141  K 4.2 4.1  CL 113* 107  CO2 22 23  GLUCOSE 121* 146*  BUN 82* 79*  CREATININE 2.38* 2.55*  CALCIUM 8.7* 8.7*  MG 2.2 2.2  PHOS 4.9*  --    Liver Function Tests: Recent Labs    08/11/22 0649  ALBUMIN 3.5   No results for input(s): "LIPASE", "AMYLASE" in the last 72 hours. CBC: Recent Labs    08/10/22 0544 08/11/22 0649 08/12/22 0536  WBC 3.1* 3.4* 3.7*  NEUTROABS 2.2 2.4  --   HGB 8.1* 8.9* 9.1*  HCT 25.1* 26.5* 27.5*  MCV 105.9* 103.5* 103.0*  PLT 85* 86* 97*   Cardiac Enzymes: No results for input(s): "CKTOTAL", "CKMB", "CKMBINDEX", "TROPONINI" in the last 72 hours. BNP: Invalid input(s): "POCBNP" D-Dimer: No results for input(s): "DDIMER" in the last 72 hours. Hemoglobin A1C: Recent Labs    08/10/22 0544  HGBA1C 6.4*   Fasting Lipid Panel: No results for input(s): "CHOL", "HDL", "LDLCALC", "TRIG", "CHOLHDL", "LDLDIRECT" in the last 72 hours. Thyroid Function Tests: No results for input(s): "TSH", "T4TOTAL", "T3FREE", "THYROIDAB" in the last 72 hours.  Invalid input(s): "FREET3" Anemia Panel: No results for input(s): "VITAMINB12", "FOLATE", "FERRITIN", "TIBC", "IRON", "RETICCTPCT" in the last 72 hours.   PHYSICAL EXAM General: Well developed, well nourished, in no acute distress HEENT:  Normocephalic and atramatic Neck:  No JVD.  Lungs:  Clear bilaterally to auscultation and percussion. Heart: HRRR . Normal S1 and S2 without gallops or murmurs.  Abdomen: Bowel sounds are positive, abdomen soft and non-tender  Msk:  Back normal, normal gait. Normal strength and tone for age. Extremities: No clubbing, cyanosis or edema.   Neuro: Alert and oriented X 3. Psych:  Good affect, responds appropriately  TELEMETRY: AV sequential Paced rhythm  ASSESSMENT AND PLAN: CHF/CRT wit ICD, LVEF 15%,getting btter with IV lasix and now entresto.  Principal Problem:   Acute on chronic HFrEF (heart failure with reduced ejection fraction) (HCC) Active Problems:   Thrombocytopenia (HCC)   Accelerated hypertension   CAD (coronary artery disease)   Pleural effusion on left   Type 2 diabetes mellitus with microalbuminuria, with long-term current use of insulin (HCC)   Acute kidney injury superimposed on CKD (HCC)   Acute exacerbation of CHF (congestive heart failure) (HCC)   Goals of care, counseling/discussion   Acute heart failure (HCC)   Hypervolemia   Pleural effusion    Joleen Stuckert A, MD, [facility] 08/12/2022 4:27 PM

## 2022-08-13 ENCOUNTER — Other Ambulatory Visit (HOSPITAL_COMMUNITY): Payer: Self-pay

## 2022-08-13 DIAGNOSIS — I5023 Acute on chronic systolic (congestive) heart failure: Secondary | ICD-10-CM | POA: Diagnosis not present

## 2022-08-13 DIAGNOSIS — N179 Acute kidney failure, unspecified: Secondary | ICD-10-CM | POA: Diagnosis not present

## 2022-08-13 DIAGNOSIS — K5900 Constipation, unspecified: Secondary | ICD-10-CM

## 2022-08-13 DIAGNOSIS — I5043 Acute on chronic combined systolic (congestive) and diastolic (congestive) heart failure: Secondary | ICD-10-CM

## 2022-08-13 DIAGNOSIS — J90 Pleural effusion, not elsewhere classified: Secondary | ICD-10-CM | POA: Diagnosis not present

## 2022-08-13 DIAGNOSIS — E1129 Type 2 diabetes mellitus with other diabetic kidney complication: Secondary | ICD-10-CM | POA: Diagnosis not present

## 2022-08-13 LAB — RENAL FUNCTION PANEL
Albumin: 3.2 g/dL — ABNORMAL LOW (ref 3.5–5.0)
Anion gap: 6 (ref 5–15)
BUN: 77 mg/dL — ABNORMAL HIGH (ref 8–23)
CO2: 27 mmol/L (ref 22–32)
Calcium: 8.8 mg/dL — ABNORMAL LOW (ref 8.9–10.3)
Chloride: 109 mmol/L (ref 98–111)
Creatinine, Ser: 2.25 mg/dL — ABNORMAL HIGH (ref 0.61–1.24)
GFR, Estimated: 29 mL/min — ABNORMAL LOW (ref >60)
Glucose, Bld: 170 mg/dL — ABNORMAL HIGH (ref 70–99)
Phosphorus: 4.3 mg/dL (ref 2.5–4.6)
Potassium: 4 mmol/L (ref 3.5–5.1)
Sodium: 142 mmol/L (ref 135–145)

## 2022-08-13 LAB — CBC
HCT: 27.7 % — ABNORMAL LOW (ref 39.0–52.0)
Hemoglobin: 9.1 g/dL — ABNORMAL LOW (ref 13.0–17.0)
MCH: 33.8 pg (ref 26.0–34.0)
MCHC: 32.9 g/dL (ref 30.0–36.0)
MCV: 103 fL — ABNORMAL HIGH (ref 80.0–100.0)
Platelets: 86 10*3/uL — ABNORMAL LOW (ref 150–400)
RBC: 2.69 MIL/uL — ABNORMAL LOW (ref 4.22–5.81)
RDW: 14.1 % (ref 11.5–15.5)
WBC: 3.3 10*3/uL — ABNORMAL LOW (ref 4.0–10.5)
nRBC: 0 % (ref 0.0–0.2)

## 2022-08-13 LAB — GLUCOSE, CAPILLARY
Glucose-Capillary: 137 mg/dL — ABNORMAL HIGH (ref 70–99)
Glucose-Capillary: 241 mg/dL — ABNORMAL HIGH (ref 70–99)

## 2022-08-13 LAB — MAGNESIUM: Magnesium: 2.2 mg/dL (ref 1.7–2.4)

## 2022-08-13 MED ORDER — SPIRONOLACTONE 25 MG PO TABS: 25.0000 mg | ORAL_TABLET | Freq: Every day | ORAL | 0 refills | Status: DC

## 2022-08-13 MED ORDER — SENNOSIDES-DOCUSATE SODIUM 8.6-50 MG PO TABS: 1.0000 | ORAL_TABLET | Freq: Two times a day (BID) | ORAL | 0 refills | Status: AC

## 2022-08-13 MED ORDER — TORSEMIDE 20 MG PO TABS
40.0000 mg | ORAL_TABLET | Freq: Two times a day (BID) | ORAL | Status: DC
  Administered 2022-08-13: 40 mg via ORAL
  Filled 2022-08-13: qty 2

## 2022-08-13 MED ORDER — TORSEMIDE 40 MG PO TABS: 40.0000 mg | ORAL_TABLET | Freq: Two times a day (BID) | ORAL | 0 refills | Status: DC

## 2022-08-13 MED ORDER — SACUBITRIL-VALSARTAN 24-26 MG PO TABS: 1.0000 | ORAL_TABLET | Freq: Two times a day (BID) | ORAL | 0 refills | Status: DC

## 2022-08-13 NOTE — Progress Notes (Signed)
[facility] Cardiology    SUBJECTIVE: Patient has improved dyspnea shortness of breath improved leg swelling ambulating in the halls well feels well enough to go home   Vitals:   08/12/22 2003 08/12/22 2322 08/13/22 0526 08/13/22 0746  BP: 115/86 105/70 119/70 108/61  Pulse: 88 89 80 83  Resp: 19 19  18   Temp: 98.1 F (36.7 C) 98.2 F (36.8 C) 97.6 F (36.4 C) 97.8 F (36.6 C)  TempSrc: Oral Oral Oral   SpO2: 99% 97% 100% 99%  Weight:      Height:         Intake/Output Summary (Last 24 hours) at 08/13/2022 1019 Last data filed at 08/13/2022 5625 Gross per 24 hour  Intake 305.86 ml  Output 2850 ml  Net -2544.14 ml      PHYSICAL EXAM  General: Well developed, well nourished, in no acute distress HEENT:  Normocephalic and atramatic Neck:  No JVD.  Lungs: Dullness in the bases bilaterally to auscultation and percussion. Heart: HRRR . Normal S1 and S2 without gallops or murmurs.  Abdomen: Bowel sounds are positive, abdomen soft and non-tender  Msk:  Back normal, normal gait. Normal strength and tone for age. Extremities: No clubbing, cyanosis or 2+edema.   Neuro: Alert and oriented X 3. Psych:  Good affect, responds appropriately   LABS: Basic Metabolic Panel: Recent Labs    08/11/22 0649 08/12/22 0536 08/13/22 0614  NA 143 141 142  K 4.2 4.1 4.0  CL 113* 107 109  CO2 22 23 27   GLUCOSE 121* 146* 170*  BUN 82* 79* 77*  CREATININE 2.38* 2.55* 2.25*  CALCIUM 8.7* 8.7* 8.8*  MG 2.2 2.2 2.2  PHOS 4.9*  --  4.3   Liver Function Tests: Recent Labs    08/11/22 0649 08/13/22 0614  ALBUMIN 3.5 3.2*   No results for input(s): "LIPASE", "AMYLASE" in the last 72 hours. CBC: Recent Labs    08/11/22 0649 08/12/22 0536 08/13/22 0614  WBC 3.4* 3.7* 3.3*  NEUTROABS 2.4  --   --   HGB 8.9* 9.1* 9.1*  HCT 26.5* 27.5* 27.7*  MCV 103.5* 103.0* 103.0*  PLT 86* 97* 86*   Cardiac Enzymes: No results for input(s): "CKTOTAL", "CKMB", "CKMBINDEX", "TROPONINI" in the last  72 hours. BNP: Invalid input(s): "POCBNP" D-Dimer: No results for input(s): "DDIMER" in the last 72 hours. Hemoglobin A1C: No results for input(s): "HGBA1C" in the last 72 hours. Fasting Lipid Panel: No results for input(s): "CHOL", "HDL", "LDLCALC", "TRIG", "CHOLHDL", "LDLDIRECT" in the last 72 hours. Thyroid Function Tests: No results for input(s): "TSH", "T4TOTAL", "T3FREE", "THYROIDAB" in the last 72 hours.  Invalid input(s): "FREET3" Anemia Panel: No results for input(s): "VITAMINB12", "FOLATE", "FERRITIN", "TIBC", "IRON", "RETICCTPCT" in the last 72 hours.  No results found.   Echo Severely depressed left ventricular function EF less than 20%  TELEMETRY: Atrial paced rhythm PVCs rate of about 90 nonspecific findings:  ASSESSMENT AND PLAN:  Principal Problem:   Acute on chronic HFrEF (heart failure with reduced ejection fraction) (HCC) Active Problems:   Thrombocytopenia (HCC)   Accelerated hypertension   CAD (coronary artery disease)   Pleural effusion on left   Type 2 diabetes mellitus with microalbuminuria, with long-term current use of insulin (HCC)   Acute kidney injury superimposed on CKD ([place])   Acute exacerbation of CHF (congestive heart failure) (HCC)   Goals of care, counseling/discussion   Acute heart failure (HCC)   Hypervolemia   Pleural effusion   Constipation  Plan Acute on chronic systolic congestive heart failure EF less than 20% continue diuresis patient responded to Lasix drip reduced swelling and shortness of breath consider discharge follow-up as an outpatient 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

## 2022-08-13 NOTE — Progress Notes (Signed)
[place], [place] 08/13/22  Subjective:   Hospital day # 4  Patient known to our practice from outpatient follow-up for CKD.  Currently admitted for volume overload.  Patient's fianc is in the room and states that he was progressively getting more edematous.  Therefore they decided to bring him in for evaluation.  They report that he does eat out and likes to eat hamburgers, hot dogs and other processed foods. Renal: 12/26 0701 - 12/27 0700 In: 305.9 [P.O.:240; I.V.:65.9] Out: 2900 [Urine:2900] Lab Results  Component Value Date   CREATININE 2.25 (H) 08/13/2022   CREATININE 2.55 (H) 08/12/2022   CREATININE 2.38 (H) 08/11/2022   Patient stands standing at sink, performing ADLs independently Wife at bedside States he feels well Room air Lower extremity edema greatly improved  Objective:  Vital signs in last 24 hours:  Temp:  [97.6 F (36.4 C)-98.2 F (36.8 C)] 97.7 F (36.5 C) (12/27 1204) Pulse Rate:  [80-89] 81 (12/27 1204) Resp:  [17-20] 20 (12/27 1204) BP: (105-138)/(61-86) 111/62 (12/27 1204) SpO2:  [97 %-100 %] 97 % (12/27 1204)  Weight change:  Filed Weights   08/10/22 2137 08/12/22 0353  Weight: 79.8 kg 80.1 kg    Intake/Output:    Intake/Output Summary (Last 24 hours) at 08/13/2022 1527 Last data filed at 08/13/2022 1020 Gross per 24 hour  Intake 305.86 ml  Output 2050 ml  Net -1744.14 ml     Physical Exam: General:  No acute distress, laying in the bed  HEENT  anicteric, moist oral mucous membrane  Pulm/lungs  normal breathing effort, lungs are clear to auscultation  CVS/Heart  regular rhythm, no rub or gallop  Abdomen:   Soft, nontender  Extremities:  1+ peripheral edema  Neurologic:  Alert, oriented, able to follow commands  Skin:  No acute rashes   Basic Metabolic Panel:  Recent Labs  Lab 08/09/22 0603 08/10/22 0544 08/11/22 0649 08/12/22 0536 08/13/22 0614  NA 141 142 143 141 142  K 4.7 4.4 4.2 4.1 4.0   CL 114* 114* 113* 107 109  CO2 20* 20* 22 23 27   GLUCOSE 101* 142* 121* 146* 170*  BUN 85* 88* 82* 79* 77*  CREATININE 2.81* 2.64* 2.38* 2.55* 2.25*  CALCIUM 8.5* 8.3* 8.7* 8.7* 8.8*  MG 2.3 2.3 2.2 2.2 2.2  PHOS  --   --  4.9*  --  4.3      CBC: Recent Labs  Lab 08/09/22 0603 08/10/22 0544 08/11/22 0649 08/12/22 0536 08/13/22 0614  WBC 3.1* 3.1* 3.4* 3.7* 3.3*  NEUTROABS 2.1 2.2 2.4  --   --   HGB 8.3* 8.1* 8.9* 9.1* 9.1*  HCT 25.8* 25.1* 26.5* 27.5* 27.7*  MCV 106.6* 105.9* 103.5* 103.0* 103.0*  PLT 83* 85* 86* 97* 86*       Lab Results  Component Value Date   HEPBSAG NON REACTIVE 02/12/2020   HEPBIGM NON REACTIVE 02/12/2020      Microbiology:  No results found for this or any previous visit (from the past 240 hour(s)).  Coagulation Studies: No results for input(s): "LABPROT", "INR" in the last 72 hours.  Urinalysis: No results for input(s): "COLORURINE", "LABSPEC", "PHURINE", "GLUCOSEU", "HGBUR", "BILIRUBINUR", "KETONESUR", "PROTEINUR", "UROBILINOGEN", "NITRITE", "LEUKOCYTESUR" in the last 72 hours.  Invalid input(s): "APPERANCEUR"    Imaging: No results found.   Medications:    sodium chloride      aspirin EC  81 mg Oral Daily   heparin  5,000 Units Subcutaneous Q8H  insulin aspart  0-15 Units Subcutaneous TID WC   sacubitril-valsartan  1 tablet Oral BID   senna-docusate  1 tablet Oral BID   sodium chloride flush  3 mL Intravenous Q12H   torsemide  40 mg Oral BID   sodium chloride, acetaminophen, ondansetron (ZOFRAN) IV, sodium chloride flush  Assessment/ Plan:  76 y.o. 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%, 05/2022) c/b recurrent pleural effusions s/p multiple thoracentesis s/p CRT-D, group 2 pulmonary hypertension, CKD stage IV, hypertension, type 2 diabetes, CAD s/p CABG (2011), functional diarrhea, anemia of chronic disease, thrombocytopenia,    admitted on 08/08/2022  for Shortness of breath [R06.02] Acute heart failure (HCC) [I50.9] Pleural effusion [J90] Acute exacerbation of CHF (congestive heart failure) ([place]) [I50.9] Hypervolemia, unspecified hypervolemia type [E87.70]  #Acute exacerbation of chronic systolic and diastolic CHF with lower extremity edema Transition patient to oral torsemide 40 mg twice daily.  Patient cleared to discharge from renal stance and continue follow-up outpatient.  #Acute kidney injury Likely secondary to hemodynamic instability and cardiorenal syndrome. Creatinine 2.55 now with UOP 4.4L.  08/10/22-2D echo shows LVEF less than 20%, global hypokinesis, moderate LVH, grade 3 diastolic dysfunction, moderately enlarged right ventricle, severely dilated left atrium, mild to moderate mitral regurgitation  #Diabetic chronic kidney disease, CKD stage IV.  Followed by Dr. Holley Raring as outpatient. Baseline creatinine 2.2-2.4 with GFR around 28-30.  From October 2023. Outpatient weight of 80 kg.   LOS: [place] 12/27/20233:27 [place], [place]

## 2022-08-13 NOTE — Progress Notes (Signed)
PULMONOLOGY         Date: 08/13/2022,   MRN# [id number] Rick Zuniga March 30, 1946     AdmissionWeight: 79.8 kg                 CurrentWeight: 80.1 kg  Referring provider: Dr Charleen Kirks   CHIEF COMPLAINT:   Acute on chronic hypoxemic respiratory failure   HISTORY OF PRESENT ILLNESS    HPI: Rick Zuniga is a 76 y.o. 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  Rick Zuniga states that for the past 4 days, he has been taking the Increased dose of torsemide and spironolactone as prescribed.  Despite this, he has been urinating less often and feels that his fluid levels have not improved at all if not increased.  He endorses shortness of breath and lower extremity edema that extends up into his groin.  He any chest pain or palpitations. On arrival to the ED, patient was normotensive at 135/79 with heart rate of 72.  He was saturating at 100% on room air.  Initial workup remarkable for potassium of 5.2, bicarb of 21, creatinine of 3.24, with GFR 19.  Hemoglobin is 9.0 with platelets of 105.  Troponin is elevated at 44 with flat trend to 47.  Patient report diarreah. He reports dribbling of urine a home. 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   08/10/22- patient is urinating heavily and is breathing better with less swelling. His BP is ok without hypotension. He had derm surgery on right face and required tylenol few times but no other pain anywhere. He's eating and going to bathroom normally. S/p nephrology evaluation, renal function is improving.   08/12/22-  patient is net 10L negative. He is feeling much improved and less dyspneic. He is  making adequate urine at this time. He has Rick Zuniga (his girlfriend) with him and she is taking care of him.  08/13/22- patient is cleared for dc home with outpatient follow up with pulmonary and cardiology and nephrology. Patient is net 13L negative. I recommend to continue torsemide 20-40mg  daily and aldactone 50 mg daily po.   PAST MEDICAL HISTORY   Past Medical History:  Diagnosis Date   AICD (automatic cardioverter/defibrillator) present    Arthritis    Cataract    Mixed OU   CHF (congestive heart failure) ([place])    Chronic kidney disease    Complication of anesthesia    Difficulty inserting IV, wants expert at IV's, no multiple attempts   COPD (chronic obstructive pulmonary disease) ([place])    COVID-19 05/23/2022   Diabetes ([place])    Type 2   Diabetic retinopathy ([facility])    NPDR OU   Heart disease    Hypertension    Hypertensive retinopathy    OU   Indigestion    Presence of permanent cardiac pacemaker    Recurrent pleural effusion on left    Thrombocytopenia ([facility])    Wears dentures    full upper and lower     SURGICAL HISTORY   Past Surgical History:  Procedure Laterality Date   ANTERIOR VITRECTOMY Right 07/02/2022   Procedure: ANTERIOR VITRECTOMY;  Surgeon: Leandrew Koyanagi, MD;  Location: [place];  Service: Ophthalmology;  Laterality: Right;   APPENDECTOMY     BI-VENTRICULAR IMPLANTABLE CARDIOVERTER DEFIBRILLATOR  (CRT-D)  05/20/2022   CARDIAC CATHETERIZATION Right 04/16/2022   CARDIAC SURGERY  12/21/2009   bypass. 4 vessel   CATARACT EXTRACTION W/PHACO Left 08/07/2021   Procedure: CATARACT EXTRACTION PHACO AND INTRAOCULAR LENS PLACEMENT (IOC) LEFT DIABETIC 9.06 01:42.2;  Surgeon: Leandrew Koyanagi, MD;  Location: [place];  Service: Ophthalmology;  Laterality: Left;  Diabetic   CATARACT EXTRACTION W/PHACO Right 07/02/2022   Procedure: CATARACT EXTRACTION PHACO AND INTRAOCULAR LENS PLACEMENT (IOC) RIGHT VISION BLUE HEALON 5 DIABETIC  10.62   01:16.7;  Surgeon: Leandrew Koyanagi, MD;  Location: [place];  Service: Ophthalmology;  Laterality: Right;   CATARACT EXTRACTION W/PHACO Right 07/16/2022   Procedure: REMOVAL OF LENS FRAMENTS RIGHT DIABETIC;  Surgeon: Leandrew Koyanagi, MD;  Location: [place];  Service: Ophthalmology;  Laterality: Right;  patient wants last   CHOLECYSTECTOMY     COLONOSCOPY  12/23/2010   IR THORACENTESIS ASP PLEURAL SPACE W/IMG GUIDE  07/18/2022   THORACENTESIS     02/25/21, 10/17/21     FAMILY HISTORY   Family History  Problem Relation Age of Onset   CAD Mother    Kidney failure Father    Diabetes Father      SOCIAL HISTORY   Social History   Tobacco Use   Smoking status: Never   Smokeless tobacco: Never  Vaping Use   Vaping Use: Never used  Substance Use Topics   Alcohol use: No   Drug use: No     MEDICATIONS    Home Medication:     Current Medication:  Current Facility-Administered Medications:    0.9 %  sodium chloride infusion, 250 mL, Intravenous, PRN, Jose Persia, MD   acetaminophen (TYLENOL) tablet 650 mg, 650 mg, Oral, Q4H PRN, Jose Persia, MD, 650 mg at 08/12/22 2200   aspirin EC tablet 81 mg, 81 mg, Oral, Daily, Jose Persia, MD, 81 mg at 08/13/22 0851   heparin injection 5,000 Units, 5,000 Units, Subcutaneous, Q8H, Jose Persia, MD, 5,000 Units at 08/13/22 0526   insulin aspart (novoLOG) injection 0-15 Units, 0-15 Units, Subcutaneous, TID WC, Jose Persia, MD, 2 Units at 08/13/22 0849   ondansetron (ZOFRAN) injection 4 mg, 4 mg, Intravenous, Q6H PRN, Jose Persia, MD   sacubitril-valsartan (ENTRESTO) 24-26 mg per tablet, 1 tablet, Oral, BID, Neoma Laming A, MD, 1 tablet at 08/13/22 0853   senna-docusate (Senokot-S) tablet 1 tablet, 1 tablet, Oral, BID, Eugenie Filler, MD, 1 tablet at 08/12/22 0851   sodium chloride flush (NS) 0.9 % injection 3 mL, 3 mL, Intravenous, Q12H, Jose Persia, MD, 3 mL at 08/13/22 0853    sodium chloride flush (NS) 0.9 % injection 3 mL, 3 mL, Intravenous, PRN, Jose Persia, MD   torsemide (DEMADEX) tablet 40 mg, 40 mg, Oral, BID, Breeze, Shantelle, NP, 40 mg at 08/13/22 0850    ALLERGIES   Pioglitazone, Furosemide, Metformin and related, Prednisone, Atorvastatin, Benazepril, Latex, Other, and Tape     REVIEW OF SYSTEMS    Review of Systems:  Gen:  Denies  fever, sweats, chills weigh loss  HEENT: Denies blurred vision, double vision, ear pain, eye pain, hearing loss, nose bleeds, sore throat Cardiac:  No dizziness, chest pain or heaviness, chest tightness,edema Resp:   reports dyspnea chronically  Gi: Denies swallowing difficulty, stomach pain, nausea or vomiting, diarrhea, constipation, bowel incontinence Gu:  Denies bladder incontinence, burning urine Ext:   Denies Joint pain, stiffness or swelling Skin:  Denies  skin rash, easy bruising or bleeding or hives Endoc:  Denies polyuria, polydipsia , polyphagia or weight change Psych:   Denies depression, insomnia or hallucinations   Other:  All other systems negative   VS: BP 108/61 (BP Location: Right Arm)   Pulse 83   Temp 97.8 F (36.6 C)   Resp 18   Ht 6\' 2"  (1.88 m)   Wt 80.1 kg   SpO2 99%   BMI 22.67 kg/m      PHYSICAL EXAM    GENERAL:NAD, no fevers, chills, no weakness no fatigue HEAD: Normocephalic, atraumatic.  EYES: Pupils equal, round, reactive to light. Extraocular muscles intact. No scleral icterus.  MOUTH: Moist mucosal membrane. Dentition intact. No abscess noted.  EAR, NOSE, THROAT: Clear without exudates. No external lesions.  NECK: Supple. No thyromegaly. No nodules. No JVD.  PULMONARY: decreased breath sounds with mild rhonchi worse at bases bilaterally.  CARDIOVASCULAR: S1 and S2. Regular rate and rhythm. No murmurs, rubs, or gallops. No edema. Pedal pulses 2+ bilaterally.  GASTROINTESTINAL: Soft, nontender, nondistended. No masses. Positive bowel sounds. No hepatosplenomegaly.   MUSCULOSKELETAL: No swelling, clubbing, or edema. Range of motion full in all extremities.  NEUROLOGIC: Cranial nerves II through XII are intact. No gross focal neurological deficits. Sensation intact. Reflexes intact.  SKIN: No ulceration, lesions, rashes, or cyanosis. Skin warm and dry. Turgor intact.  PSYCHIATRIC: Mood, affect within normal limits. The patient is awake, alert and oriented x 3. Insight, judgment intact.       IMAGING   @IMAGES @   ASSESSMENT/PLAN   Acute on chronic hypoxemic respiratory failure -Due to severe and advanced CHF with resultant cardiorenal failure and diabetic nephropathy -recurrent bilateral effusions worse on left.  -patient has had numerous effusions drained via thoracentesis, he has declined pleurX cather -he is approaching need for HD -his fluid balance has not been documented accurately as he shares he has been urinating very aggreesively -patient is interval stable with net negative 13L   Thank you for allowing me to participate in the care of this patient.   Patient/Family are satisfied with care plan and all questions have been answered.    Provider disclosure: Patient with at least one acute or chronic illness or injury that poses a threat to life or bodily function and is being managed actively during this encounter.  All of the below services have been performed independently by signing provider:  review of prior documentation from internal and or external health records.  Review of previous and current lab results.  Interview and comprehensive assessment during patient visit today. Review of current and previous chest radiographs/CT scans. Discussion of management and test interpretation with health care team and patient/family.   This document was prepared using Dragon voice recognition software and may include unintentional dictation errors.     Ottie Glazier, M.D.  Division of Pulmonary & Critical Care Medicine

## 2022-08-13 NOTE — TOC CM/SW Note (Signed)
  Transition of Care [facility]) Screening Note   Patient Details  Name: JEVAN GAUNT Date of Birth: 08-16-1946   Transition of Care [facility]) CM/SW Contact:    Gerilyn Pilgrim, LCSW Phone Number: 08/13/2022, 1:49 PM    Transition of Care Department [facility]) has reviewed patient and no TOC needs have been identified at this time. We will continue to monitor patient advancement through interdisciplinary progression rounds. If new patient transition needs arise, please place a TOC consult.

## 2022-08-13 NOTE — Discharge Summary (Incomplete)
Physician Discharge Summary   Patient: Rick Zuniga MRN: [id number] DOB: Aug 25, 1945  Admit date:     08/08/2022  Discharge date: 08/13/22  Discharge Physician: Raiford Noble, DO   PCP: [name], MD   Recommendations at discharge:    ***  Discharge Diagnoses: Principal Problem:   Acute on chronic HFrEF (heart failure with reduced ejection fraction) ([place]) Active Problems:   Pleural effusion on left   Acute kidney injury superimposed on CKD ([place])   Goals of care, counseling/discussion   Accelerated hypertension   Type 2 diabetes mellitus with microalbuminuria, with long-term current use of insulin (HCC)   Thrombocytopenia (HCC)   CAD (coronary artery disease)   Acute exacerbation of CHF (congestive heart failure) (HCC)   Acute heart failure (HCC)   Hypervolemia   Pleural effusion   Constipation  Resolved Problems:   * No resolved hospital problems. *  Hospital Course: HPI per Dr. Marcelyn Bruins is a 76 y.o. 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   [name] states that for the past 4 days, he has been taking the Increased dose of torsemide and spironolactone as prescribed by his pulmonologist.  Despite this, he has been urinating less often and feels that his fluid levels have not improved at all if not increased.  He endorses shortness of breath and lower extremity edema that extends up into his groin.  He any chest pain or palpitations.   Per chart review, patient had a recent visit with his pulmonologist, Dr. Lanney Gins, at which time palliative Pleurx catheter was discussed.  In addition, he  was started on spironolactone and his torsemide dose was increased from 40 mg to 100 mg.  Per note, patient at that time agreed to Pleurx.  I discussed this plan with [name] and he states that he has spoken to his other physicians, who have recommended he not have that done.   ED course: On arrival to the ED, patient was normotensive at 135/79 with heart rate of 72.  He was saturating at 100% on room air.  Initial workup remarkable for potassium of 5.2, bicarb of 21, creatinine of 3.24, with GFR 19.  Hemoglobin is 9.0 with platelets of 105.  Troponin is elevated at 44 with flat trend to 47.  EKG with atrial sensed ventricular pacing with multiple PVCs.  No ST or T wave changes concerning for acute ischemia.  Chest x-ray with bilateral pleural effusions, small, and hyperexpansion with persistent retrocardiac collapse/consolidation.  Due to acute on chronic heart failure, patient was started on Lasix and TRH contacted for admission.    Assessment and Plan: * Acute on chronic HFrEF (heart failure with reduced ejection fraction) ([place]) Patient presented with worsening shortness of breath and lower extremity edema extending up to the sacrum at this point despite home torsemide being increased from 40 mg daily to 100 mg daily on admission.   -BNP markedly elevated at 3000.   -Prior echocardiogram obtained on 05/18/2022 notable for severe biventricular failure with LVEF less than 15% done at outside hospital/[facility] everywhere.  This was prior to CRT-D placement.  No repeat echocardiogram since then. -Patient started on Lasix 80 mg IV every  12 hours initially on admission with good urine output and subsequently placed on a Lasix drip per cardiology..   -2D echo obtained with a EF of < 20%, left ventricular global hypokinesis, grade 3 diastolic dysfunction, moderate LVH, mildly reduced right ventricular systolic function, moderately enlarged right ventricular size, severely dilated left atrial  size, no mitral stenosis, mild to moderate TVR, no aortic stenosis noted.   -Patient seen in consultation by cardiology and patient placed on a Lasix drip with urine output of 4.450 L over the past 24 hours.  -Patient is -12.5 L during this hospitalization. -Entresto added per cardiology. -Weight currently 176.59 pounds from 176 pounds on 08/10/2022, doubt accuracy of weights as patient has had good urine output and clinical improvement and is -12.5 L during this hospitalization.  -Strict I's and O's, daily weights. -Due to complicated cardiac history, presentation with acute on chronic CKD cardiology consulted and following and managing volume status.    Pleural effusion on left Patient stated he was recommended to be evaluated in the ED by his pulmonologist due to shortness of breath with possible recurrence of pleural effusion.  At that time, they discussed Pleurx catheter, which he is declining at this time.  -CXR x-ray relatively unchanged from 3 weeks ago. -Continue diuresis as above for acute CHF exacerbation. -CT chest done with concerns for loculated pleural effusion. -Primary pulmonologist consulted to assess the patient and following.   -It is noted per primary pulmonologist that patient has had recurrent bilateral effusions worse on the left, numerous effusions drained via thoracentesis patient noted to have declined Pleurx catheter and pulmonary feels patient may be approaching need for HD.  Pulmonary consulted with nephrology who has assessed the patient and at this time does not feel any need for HD as patient responding well to Lasix drip. -No plans for intervention at this time per pulmonary.   -Pulmonary recommending on discharge spironolactone 50 mg daily and torsemide 40 mg daily. -Pulmonary following.  -Will need outpatient follow-up with pulmonary.  Acute kidney injury superimposed on CKD ([place]) Creatinine on presentation elevated at 3.24 compared to baseline between 2.2 and  2.5.   -Potassium elevated at 5.2 despite being on high-dose diuretics.  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  He states that he follows with a nephrologist but he has never been told that his kidney function had declined so much. -Patient placed on IV Lasix 80 mg every 12 with urine output and assessed by cardiology and placed on a Lasix drip.  -Patient states he has had good urine output on Lasix drip, however urine output not properly recorded early on during the hospitalization, patient with a urine output of 4.450 L over the past 24 hours and is - 12.55 L during this hospitalization.   -Patient noted to be responding well to Lasix drip with creatinine trending down currently at 2.55 from 2.38 from 2.64 from 3.24 on admission.   -Patient seen in consultation by nephrology who recommend continuation of Lasix drip at this and no need for hemodialysis at this time and to monitor with diuresis.  -Continue to hold home regimen spironolactone and losartan. -Patient started on Entresto per cardiology. -Nephrology following.  Goals of care, counseling/discussion When approaching the topic of goals of care and CODE STATUS, [name] and his significant other Ms. Charlynn Grimes were quite shocked and surprised to Dr. Charleen Kirks (admitting physician's) concerns regarding his severe biventricular heart failure  that seems to be unresponsive to CRT-D, history of CKD stage IV and recurrent pleural effusions that have required numerous thoracentesis.  They had not considered the topic of palliative care and did not realize that the Pleurx was palliative in nature.   -Patient and his significant other seem to have a poor understanding of the severity of patient's chronic multiorgan failure.  They would like to think before a Palliative consult is placed. -Revisited with patient and significant other about palliative care and patient and significant other  at this time want to hold off on palliative care involvement at this time. -In my view patient and significant other seem to also have a poor understanding of severity of patient's chronic multiorgan failure. -Cardiology, pulmonary nephrology consulted and are following and hopefully they can shed further light into this for patient and his loved one.  -Will hold off on any palliative care consultation at this time per patient and significant other request. -Outpatient follow-up with PCP as well.   Accelerated hypertension - BP noted to be soft on presentation and as such patient's losartan held in the setting of AKI. -Patient currently being diuresed on a Lasix drip. -Entresto added to regimen per cardiology. -Follow.   Type 2 diabetes mellitus with microalbuminuria, with long-term current use of insulin (HCC) - Hemoglobin A1c noted at 10.9  (06/08/2020 ) -Repeat hemoglobin A1c 6.4. -CBG 142 this morning.   -Continue to hold home regimen hypoglycemic agents.   -SSI.   Thrombocytopenia (HCC) Chronic. -Platelet count slowly trending down but seemed initially to be plateauing but seems to be trending back up currently at 97.  -Patient with no overt bleeding. -Follow.  CAD (coronary artery disease) No evidence of acute EKG changes and patient is chest pain-free.  Troponin leak is likely in the setting of CKD. -Continue home regimen aspirin. -ARB on hold due to AKI and soft blood pressure. -Entresto added per cardiology. -Monitor renal function. -Per cardiology.  Constipation -Sorbitol every 3 hours x 2 doses. -Place on Senokot-S twice daily.      {Tip this will not be part of the note when signed Body mass index is 22.67 kg/m. , ,  (Optional):26781}  {(NOTE) Pain control PDMP Statment (Optional):26782} Consultants: *** Procedures performed: ***  Disposition: {Plan; Disposition:26390} Diet recommendation:  Discharge Diet Orders (From admission, onward)     Start      Ordered   08/13/22 0000  Diet - low sodium heart healthy        08/13/22 1326   08/13/22 0000  Diet Carb Modified        08/13/22 1326           {Diet_Plan:26776} DISCHARGE MEDICATION: Allergies as of 08/13/2022       Reactions   Pioglitazone Swelling   Furosemide Other (See Comments)   Pt may have had swelling in his face per spouse.  Drug discontinued    Metformin And Related Diarrhea   Prednisone    Other Reaction(s): Other (See Comments) Face swells up and severe bleeding   Atorvastatin Rash   Benazepril Rash   Latex Rash   (tape only)   Other Rash   Pt reported that he is allergic to Ace wrap.  Reaction include severe skin irritation.   Tape Rash        Medication List     STOP taking these medications    losartan 25 MG tablet Commonly known as: COZAAR       TAKE these medications  aspirin EC 81 MG tablet Take 81 mg by mouth daily.   FreeStyle Libre 14 Day Reader Kerrin Mo Use 1 Device as directed Dx: E11.29   FreeStyle Libre 14 Day Sensor Misc SMARTSIG:1 Kit(s) Topical Every 2 Weeks   glipiZIDE 5 MG tablet Commonly known as: GLUCOTROL Take 5 mg by mouth daily before breakfast.   insulin degludec 100 UNIT/ML FlexTouch Pen Commonly known as: TRESIBA Inject 10 Units into the skin daily.   meclizine 25 MG tablet Commonly known as: ANTIVERT Take 1 tablet (25 mg total) by mouth 3 (three) times daily as needed for dizziness.   metoprolol succinate 25 MG 24 hr tablet Commonly known as: TOPROL-XL Take 12.5 mg by mouth daily.   nitroGLYCERIN 0.4 MG SL tablet Commonly known as: NITROSTAT Place 0.4 mg under the tongue every 5 (five) minutes as needed for chest pain.   sacubitril-valsartan 24-26 MG Commonly known as: ENTRESTO Take 1 tablet by mouth 2 (two) times daily.   senna-docusate 8.6-50 MG tablet Commonly known as: Senokot-S Take 1 tablet by mouth 2 (two) times daily.   spironolactone 25 MG tablet Commonly known as: ALDACTONE Take 1  tablet (25 mg total) by mouth daily. Start taking on: August 20, 2022 What changed:  medication strength how much to take These instructions start on August 20, 2022. If you are unsure what to do until then, ask your doctor or other care provider.   tamsulosin 0.4 MG Caps capsule Commonly known as: FLOMAX Take 0.4 mg by mouth daily.   Torsemide 40 MG Tabs Take 40 mg by mouth 2 (two) times daily. What changed:  medication strength how much to take Another medication with the same name was removed. Continue taking this medication, and follow the directions you see here.        Discharge Exam: Filed Weights   08/10/22 2137 08/12/22 0353  Weight: 79.8 kg 80.1 kg   ***  Condition at discharge: {DC Condition:26389}  The results of significant diagnostics from this hospitalization (including imaging, microbiology, ancillary and laboratory) are listed below for reference.   Imaging Studies: ECHOCARDIOGRAM COMPLETE  Result Date: 08/10/2022    ECHOCARDIOGRAM REPORT   Patient Name:   CINCH ORMOND Date of Exam: 08/10/2022 Medical Rec #:  [id number]           Height:       73.0 in Accession #:    [id number]          Weight:       184.0 lb Date of Birth:  1945/12/08           BSA:          2.077 m Patient Age:    34 years            BP:           133/70 mmHg Patient Gender: M                   HR:           71 bpm. Exam Location:  [facility] Procedure: 2D Echo, Color Doppler and Cardiac Doppler Indications:     I50.9 Congestive Heart Failure  History:         Patient has prior history of Echocardiogram examinations, most                  recent 02/08/2020. Defibrillator, CKD and COPD; Risk  Factors:Hypertension and Diabetes.  Sonographer:     Charmayne Sheer Referring Phys:  [place] Diagnosing Phys: [place]  1. Left ventricular ejection fraction, by estimation, is <20%. The left ventricle has severely decreased function. The left ventricle demonstrates  global hypokinesis. The left ventricular internal cavity size was severely dilated. There is moderate left  ventricular hypertrophy. Left ventricular diastolic parameters are consistent with Grade III diastolic dysfunction (restrictive).  2. Right ventricular systolic function is mildly reduced. The right ventricular size is moderately enlarged.  3. Left atrial size was severely dilated.  4. The mitral valve is normal in structure. Mild to moderate mitral valve regurgitation. No evidence of mitral stenosis.  5. Tricuspid valve regurgitation is mild to moderate.  6. The aortic valve is normal in structure. Aortic valve regurgitation is mild to moderate. No aortic stenosis is present.  7. The inferior vena cava is normal in size with greater than 50% respiratory variability, suggesting right atrial pressure of 3 mmHg. Conclusion(s)/Recommendation(s): Findings consistent with dilated cardiomyopathy. FINDINGS  Left Ventricle: Left ventricular ejection fraction, by estimation, is <20%. The left ventricle has severely decreased function. The left ventricle demonstrates global hypokinesis. The left ventricular internal cavity size was severely dilated. There is moderate left ventricular hypertrophy. Left ventricular diastolic parameters are consistent with Grade III diastolic dysfunction (restrictive). Right Ventricle: The right ventricular size is moderately enlarged. No increase in right ventricular wall thickness. Right ventricular systolic function is mildly reduced. Left Atrium: Left atrial size was severely dilated. Right Atrium: Right atrial size was normal in size. Pericardium: There is no evidence of pericardial effusion. Mitral Valve: The mitral valve is normal in structure. Mild to moderate mitral valve regurgitation. No evidence of mitral valve stenosis. Tricuspid Valve: The tricuspid valve is normal in structure. Tricuspid valve regurgitation is mild to moderate. No evidence of tricuspid stenosis. Aortic  Valve: The aortic valve is normal in structure. Aortic valve regurgitation is mild to moderate. Aortic regurgitation PHT measures 568 msec. No aortic stenosis is present. Aortic valve mean gradient measures 6.0 mmHg. Aortic valve peak gradient measures 12.7 mmHg. Aortic valve area, by VTI measures 1.94 cm. Pulmonic Valve: The pulmonic valve was normal in structure. Pulmonic valve regurgitation is trivial. No evidence of pulmonic stenosis. Aorta: The aortic root is normal in size and structure. Venous: The inferior vena cava is normal in size with greater than 50% respiratory variability, suggesting right atrial pressure of 3 mmHg. IAS/Shunts: No atrial level shunt detected by color flow Doppler.  LEFT VENTRICLE PLAX 2D LVIDd:         6.10 cm      Diastology LVIDs:         5.70 cm      LV e' medial:    4.35 cm/s LV PW:         1.30 cm      LV E/e' medial:  22.3 LV IVS:        0.80 cm      LV e' lateral:   4.90 cm/s LVOT diam:     2.00 cm      LV E/e' lateral: 19.8 LV SV:         71 LV SV Index:   34 LVOT Area:     3.14 cm  LV Volumes (MOD) LV vol d, MOD A4C: 196.0 ml LV vol s, MOD A4C: 166.0 ml LV SV MOD A4C:     196.0 ml RIGHT VENTRICLE RV Basal diam:  4.80 cm  RV S prime:     5.87 cm/s LEFT ATRIUM             Index        RIGHT ATRIUM           Index LA diam:        4.50 cm 2.17 cm/m   RA Area:     17.80 cm LA Vol (A2C):   63.3 ml 30.48 ml/m  RA Volume:   47.60 ml  22.92 ml/m LA Vol (A4C):   75.2 ml 36.21 ml/m LA Biplane Vol: 69.1 ml 33.27 ml/m  AORTIC VALVE                     PULMONIC VALVE AV Area (Vmax):    2.19 cm      PV Vmax:          1.17 m/s AV Area (Vmean):   2.03 cm      PV Vmean:         74.200 cm/s AV Area (VTI):     1.94 cm      PV VTI:           0.240 m AV Vmax:           178.00 cm/s   PV Peak grad:     5.5 mmHg AV Vmean:          118.000 cm/s  PV Mean grad:     3.0 mmHg AV VTI:            0.367 m       PR End Diast Vel: 7.40 msec AV Peak Grad:      12.7 mmHg AV Mean Grad:      6.0 mmHg  LVOT Vmax:         124.00 cm/s LVOT Vmean:        76.100 cm/s LVOT VTI:          0.227 m LVOT/AV VTI ratio: 0.62 AI PHT:            568 msec  AORTA Ao Root diam: 3.20 cm MITRAL VALVE               TRICUSPID VALVE MV Area (PHT): 3.48 cm    TR Peak grad:   38.7 mmHg MV Decel Time: 218 msec    TR Vmax:        311.00 cm/s MR Peak grad: 85.4 mmHg MR Vmax:      462.00 cm/s  SHUNTS MV E velocity: 96.90 cm/s  Systemic VTI:  0.23 m MV A velocity: 55.00 cm/s  Systemic Diam: 2.00 cm MV E/A ratio:  1.76 Shaukat Khan Electronically signed by Neoma Laming Signature Date/Time: 08/10/2022/12:25:36 PM    Final    CT CHEST WO CONTRAST  Result Date: 08/08/2022 CLINICAL DATA:  Pleural effusion EXAM: CT CHEST WITHOUT CONTRAST TECHNIQUE: Multidetector CT imaging of the chest was performed following the standard protocol without IV contrast. RADIATION DOSE REDUCTION: This exam was performed according to the departmental dose-optimization program which includes automated exposure control, adjustment of the mA and/or kV according to patient size and/or use of iterative reconstruction technique. COMPARISON:  Previous CT done on 03/03/2022, chest radiographs including the examination done today FINDINGS: Cardiovascular: Heart is enlarged in size. Coronary artery calcifications are seen. Pacemaker battery is seen in left infraclavicular region with biventricular leads. There is previous coronary bypass surgery. Mediastinum/Nodes: No new significant lymphadenopathy is seen. Lungs/Pleura: There are patchy infiltrates in the  lower lung fields, more so on the left side. There is interval clearing of faint ground-glass nodular infiltrate in right upper lobe since 03/03/2022. Small to moderate bilateral pleural effusions are seen, more so on the left side. Portions of left pleural effusion appears to be loculated. There is interval increase in amount of bilateral pleural effusions. There are no pockets of air in the pleural effusions. There is  no pneumothorax. Upper Abdomen: Arterial calcifications are seen. Surgical clips are seen in gallbladder fossa. Musculoskeletal: No acute findings are seen. IMPRESSION: Small to moderate bilateral pleural effusions, more so on the left side with interval increase. Part of the left pleural effusion appears to be loculated. There are patchy infiltrates in both lower lung fields, more so on the left side suggesting atelectasis/pneumonia. Cardiomegaly.  Coronary artery disease. Other findings as described in the body of the report. Electronically Signed   By: Elmer Picker M.D.   On: 08/08/2022 20:06   DG Chest 2 View  Result Date: 08/08/2022 CLINICAL DATA:  Shortness of breath. EXAM: CHEST - 2 VIEW COMPARISON:  07/18/2022 FINDINGS: Lungs are hyperexpanded. The cardio pericardial silhouette is enlarged. Retrocardiac collapse/consolidation is similar to prior. Tiny bilateral pleural effusions evident. Bones are diffusely demineralized. Left-sided permanent pacemaker/AICD again noted. IMPRESSION: 1. Hyperexpansion with persistent retrocardiac collapse/consolidation. 2. Tiny bilateral pleural effusions. Electronically Signed   By: Misty Stanley M.D.   On: 08/08/2022 12:06   IR THORACENTESIS ASP PLEURAL SPACE W/IMG GUIDE  Result Date: 07/18/2022 INDICATION: Patient with a history of CHF, chronic kidney disease with recurrent left pleural effusion. Interventional radiology asked to perform a therapeutic thoracentesis. EXAM: ULTRASOUND GUIDED THORACENTESIS MEDICATIONS: 1% lidocaine 10 mL COMPLICATIONS: None immediate. PROCEDURE: An ultrasound guided thoracentesis was thoroughly discussed with the patient and questions answered. The benefits, risks, alternatives and complications were also discussed. The patient understands and wishes to proceed with the procedure. Written consent was obtained. Ultrasound was performed to localize and mark an adequate pocket of fluid in the left chest. The area was then prepped  and draped in the normal sterile fashion. 1% Lidocaine was used for local anesthesia. Under ultrasound guidance a 6 Fr Safe-T-Centesis catheter was introduced. Thoracentesis was performed. The catheter was removed and a dressing applied. FINDINGS: A total of approximately 550 mL of clear yellow fluid was removed. IMPRESSION: Successful ultrasound guided left thoracentesis yielding 550 mL of pleural fluid. Read by: Soyla Dryer, NP Electronically Signed   By: Albin Felling M.D.   On: 07/18/2022 13:04   DG Chest Port 1 View  Result Date: 07/18/2022 CLINICAL DATA:  Status post left thoracentesis EXAM: PORTABLE CHEST 1 VIEW COMPARISON:  Chest CT dated March 25, 2022 FINDINGS: Cardiac and mediastinal contours are unchanged post median sternotomy and CABG. Interval placement of left chest wall dual lead pacer with leads projecting over the expected area of the right ventricle, right atrium and left ventricle. Mild interstitial and airspace opacities, likely due to pulmonary edema. Small bilateral pleural effusions. No evidence of pneumothorax. IMPRESSION: No evidence of pneumothorax status post left thoracentesis. Electronically Signed   By: Yetta Glassman M.D.   On: 07/18/2022 11:42    Microbiology: Results for orders placed or performed during the hospital encounter of 03/25/22  Fungus Culture With Stain     Status: None   Collection Time: 03/25/22 10:20 AM   Specimen: PATH Cytology Pleural fluid  Result Value Ref Range Status   Fungus Stain Final report  Final   Fungus (Mycology) Culture Final report  Final    Comment: (NOTE) Performed At: [facility] [address] [place], [place] [id number] Rush Farmer MD KZ:[id number]    Fungal Source PLEURAL  Final    Comment: Performed at [facility], [address] [place], [place] [address]  Acid Fast Culture with reflexed sensitivities     Status: None   Collection Time: 03/25/22 10:20 AM   Specimen: PATH Cytology Pleural  fluid  Result Value Ref Range Status   Acid Fast Culture Negative  Final    Comment: (NOTE) No acid fast bacilli isolated after 6 weeks. Performed At: [facility] [place], [place] [id number] Rush Farmer MD JS:[id number]    Source of Sample PLEURAL  Final    Comment: Performed at [facility], [place], [place] [address]  Acid Fast Smear (AFB)     Status: None   Collection Time: 03/25/22 10:20 AM   Specimen: PATH Cytology Pleural fluid  Result Value Ref Range Status   AFB Specimen Processing Concentration  Final   Acid Fast Smear Negative  Final    Comment: (NOTE) Performed At: [facility] [place], [place] [id number] Rush Farmer MD OE:[id number]    Source (AFB) PLEURAL  Final    Comment: Performed at [facility], [address] [place], [place] [address]  Fungus Culture Result     Status: None   Collection Time: 03/25/22 10:20 AM  Result Value Ref Range Status   Result 1 Comment  Final    Comment: (NOTE) KOH/Calcofluor preparation:  no fungus observed. Performed At: [facility] [place], [place] [id number] Rush Farmer MD YB:[id number]   Fungal organism reflex     Status: None   Collection Time: 03/25/22 10:20 AM  Result Value Ref Range Status   Fungal result 1 Comment  Final    Comment: (NOTE) No yeast or mold isolated after 4 weeks. Performed At: [facility] [place], [place] [id number] Rush Farmer MD TI:[id number]     Labs: CBC: Recent Labs  Lab 08/09/22 0603 08/10/22 0544 08/11/22 0649 08/12/22 0536 08/13/22 0614  WBC 3.1* 3.1* 3.4* 3.7* 3.3*  NEUTROABS 2.1 2.2 2.4  --   --   HGB 8.3* 8.1* 8.9* 9.1* 9.1*  HCT 25.8* 25.1* 26.5* 27.5* 27.7*  MCV 106.6* 105.9* 103.5* 103.0* 103.0*  PLT 83* 85* 86* 97* 86*   Basic Metabolic Panel: Recent Labs  Lab 08/09/22 0603 08/10/22 0544 08/11/22 0649 08/12/22 0536  08/13/22 0614  NA 141 142 143 141 142  K 4.7 4.4 4.2 4.1 4.0  CL 114* 114* 113* 107 109  CO2 20* 20* _0 GLUCOSE 101* 142* 121* 146* 170*  BUN 85* 88* 82* 79* 77*  CREATININE 2.81* 2.64* 2.38* 2.55* 2.25*  CALCIUM 8.5* 8.3* 8.7* 8.7* 8.8*  MG 2.3 2.3 2.2 2.2 2.2  PHOS  --   --  4.9*  --  4.3   Liver Function Tests: Recent Labs  Lab 08/09/22 0603 08/11/22 0649 08/13/22 0614  AST 16  --   --   ALT 18  --   --   ALKPHOS 80  --   --   BILITOT 1.6*  --   --   PROT 6.0*  --   --   ALBUMIN 3.4* 3.5 3.2*   CBG: Recent Labs  Lab 08/12/22 1134 08/12/22 1607 08/12/22 2030 08/13/22 0745 08/13/22 1201  GLUCAP 267* 90 156* 137* 241*  Discharge time spent: {LESS THAN/GREATER XPFR:33125} 30 minutes.  Signed: Kerney Elbe, DO [facility] 08/13/2022

## 2022-08-14 ENCOUNTER — Other Ambulatory Visit: Payer: Self-pay | Admitting: Family

## 2022-08-14 MED ORDER — SACUBITRIL-VALSARTAN 24-26 MG PO TABS: 1.0000 | ORAL_TABLET | Freq: Two times a day (BID) | ORAL | 0 refills | Status: DC

## 2022-08-14 NOTE — Progress Notes (Signed)
RX and entresto voucher sent to [facility].

## 2022-08-17 NOTE — Discharge Summary (Addendum)
Physician Discharge Summary   Patient: Rick Zuniga MRN: [id number] DOB: [birth year]  Admit date:     08/08/2022  Discharge date: 08/13/22  Discharge Physician: Raiford Noble, DO    PCP: [name], MD   Recommendations at discharge:   Follow-up with PCP within 1 to 2 weeks and repeat CBC, CMP, mag, Phos within 1 week Follow-up with nephrology in outpatient setting and continue close monitoring and outpatient follow-up with Dr. Holley Raring for chronic kidney disease stage IV Follow-up with pulmonology in outpatient setting and consider Pleurx catheter placement if necessary and repeat chest x-ray in 3 to 6 weeks Follow-up with cardiology for ischemic cardiomyopathy and systolic dysfunction and outpatient CHF follow-up  Discharge Diagnoses: Principal Problem:   Acute on chronic HFrEF (heart failure with reduced ejection fraction) ([place]) Active Problems:   Pleural effusion on left   Acute kidney injury superimposed on CKD ([place])   Goals of care, counseling/discussion   Accelerated hypertension   Type 2 diabetes mellitus with microalbuminuria, with long-term current use of insulin (HCC)   Thrombocytopenia (HCC)   CAD (coronary artery disease)   Acute exacerbation of CHF (congestive heart failure) ([place])   Acute heart failure (HCC)   Hypervolemia   Pleural effusion   Constipation  Resolved Problems:   * No resolved hospital problems. *  Hospital Course: HPI per Dr. Marcelyn Bruins is a 76 y.o. 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   Rick Zuniga states that for the past 4 days, he has been taking the Increased  dose of torsemide and spironolactone as prescribed by his pulmonologist.  Despite this, he has been urinating less often and feels that his fluid levels have not improved at all if not increased.  He endorses shortness of breath and lower extremity edema that extends up into his groin.  He any chest pain or palpitations.   Per chart review, patient had a recent visit with his pulmonologist, Dr. Lanney Gins, at which time palliative Pleurx catheter was discussed.  In addition, he was started on spironolactone and his torsemide dose was increased from 40 mg to 100 mg.  Per note, patient at that time agreed to Pleurx.  I discussed this plan with Rick Zuniga and he states that he has spoken to his other physicians, who have recommended he not have that done.   ED course: On arrival to the ED, patient was normotensive at 135/79 with heart rate of 72.  He was saturating at 100% on room air.  Initial workup remarkable for potassium of 5.2, bicarb of 21, creatinine of 3.24, with GFR 19.  Hemoglobin is 9.0 with platelets of 105.  Troponin is elevated at 44 with flat trend to 47.  EKG with atrial sensed ventricular pacing with multiple PVCs.  No ST or T wave changes concerning for acute ischemia.  Chest x-ray with bilateral pleural effusions, small, and hyperexpansion with persistent retrocardiac collapse/consolidation.  Due to acute on chronic heart failure, patient was started on Lasix and [facility] contacted for admission.    He was diuresed and subsequently pulmonary, cardiology as well as nephrology were involved in his case.  Renal function improved with diuresis and he was changed to oral  torsemide 40 mg twice daily as well as spironolactone 25 mg p.o. daily.  He improved and was back to his baseline after IV diuresis.  He will need to follow-up with PCP, pulmonary, cardiology as well as nephrology in outpatient setting.  Assessment and Plan: * Acute on chronic Systolic HFrEF (heart failure with reduced ejection  fraction) and Grade 3 Diastolic Dysfunction ([place]) Patient presented with worsening shortness of breath and lower extremity edema extending up to the sacrum at this point despite home torsemide being increased from 40 mg daily to 100 mg daily on admission.   -BNP markedly elevated at 3000.   -Prior echocardiogram obtained on 05/18/2022 notable for severe biventricular failure with LVEF less than 15% done at outside hospital/[facility] everywhere.  This was prior to CRT-D placement.  No repeat echocardiogram since then. -Patient started on Lasix 80 mg IV every 12 hours initially on admission with good urine output and subsequently placed on a Lasix drip per cardiology..   -2D echo obtained with a EF of < 20%, left ventricular global hypokinesis, grade 3 diastolic dysfunction, moderate LVH, mildly reduced right ventricular systolic function, moderately enlarged right ventricular size, severely dilated left atrial size, no mitral stenosis, mild to moderate TVR, no aortic stenosis noted.   -Patient seen in consultation by cardiology and patient placed on a Lasix drip with urine output of 4.450 L over the past 24 hours.  -Patient is - -13.597 L since hospitalization -Entresto added per cardiology. -Weight currently 176.59 pounds from 176 pounds on 08/10/2022, doubt accuracy of weights as patient has had good urine output and clinical improvement and is -12.5 L during this hospitalization.  -Strict I's and O's, daily weights. -Due to complicated cardiac history, presentation with acute on chronic CKD cardiology consulted and following and managing volume status.   -Cardiology and nephrology both recommended torsemide 40 mg twice daily as well as Aldactone 25 mg p.o. daily  Pleural effusion on left Patient stated he was recommended to be evaluated in the ED by his pulmonologist due to shortness of breath with possible recurrence of pleural effusion.  At that time, they discussed Pleurx catheter, which he  is declining at this time.  -CXR x-ray relatively unchanged from 3 weeks ago. -Continue diuresis as above for acute CHF exacerbation. -CT chest done with concerns for loculated pleural effusion. -Primary pulmonologist consulted to assess the patient and following.   -It is noted per primary pulmonologist that patient has had recurrent bilateral effusions worse on the left, numerous effusions drained via thoracentesis patient noted to have declined Pleurx catheter and pulmonary feels patient may be approaching need for HD.  Pulmonary consulted with nephrology who has assessed the patient and at this time does not feel any need for HD as patient responding well to Lasix drip. -No plans for intervention at this time per pulmonary.   -Pulmonary recommending on discharge spironolactone 50 mg daily and torsemide 20 to 40 mg daily however will discharge on 40 twice daily per nephrology recommendations -Pulmonary following.  -Will need outpatient follow-up with pulmonary.  Acute kidney injury superimposed on CKD ([place]) Creatinine on presentation elevated at 3.24 compared to baseline between 2.2 and 2.5.   -Potassium elevated at 5.2 despite being on high-dose diuretics.  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  He states that he follows with a nephrologist but he has never been told that his kidney function had declined so  much. -Patient placed on IV Lasix 80 mg every 12 with urine output and assessed by cardiology and placed on a Lasix drip.  -Patient states he has had good urine output on Lasix drip, however urine output not properly recorded early on during the hospitalization, patient with a urine output of 4.450 L over the past 24 hours and is -13.597 L during this hospitalization.   -Patient responded well to the Lasix drip and transition to his home medication regimen --BUN/Cr Trend: Recent Labs  Lab 08/08/22 1132 08/09/22 0603  08/10/22 0544 08/11/22 0649 08/12/22 0536 08/13/22 0614  BUN 87* 85* 88* 82* 79* 77*  CREATININE 3.24* 2.81* 2.64* 2.38* 2.55* 2.25*   -Patient seen in consultation by nephrology who recommend continuation of Lasix drip at this and no need for hemodialysis at this time and to monitor with diuresis.  -Resumed home spironolactone but will continue to hold ARB and he was changed to [facility] -Patient started on Entresto per cardiology. -Nephrology following and recommending outpatient follow-up.  Goals of care, counseling/discussion When approaching the topic of goals of care and CODE STATUS, Rick Zuniga and his significant other Ms. Charlynn Grimes were quite shocked and surprised to Dr. Charleen Kirks (admitting physician's) concerns regarding his severe biventricular heart failure that seems to be unresponsive to CRT-D, history of CKD stage IV and recurrent pleural effusions that have required numerous thoracentesis.  They had not considered the topic of palliative care and did not realize that the Pleurx was palliative in nature.   -Patient and his significant other seem to have a poor understanding of the severity of patient's chronic multiorgan failure.  They would like to think before a Palliative consult is placed. -Revisited with patient and significant other about palliative care and patient and significant other at this time want to hold off on palliative care involvement at this time. -In my view patient and significant other seem to also have a poor understanding of severity of patient's chronic multiorgan failure. -Cardiology, pulmonary nephrology consulted and are following and hopefully they can shed further light into this for patient and his loved one.  -Will hold off on any palliative care consultation at this time per patient and significant other request. -Outpatient follow-up with PCP as well.  Accelerated hypertension - BP noted to be soft on presentation and as such patient's losartan  held in the setting of AKI. -Patient currently being diuresed on a Lasix drip and then transition back to his home dose. -Entresto added to regimen per cardiology. -Follow.   Type 2 diabetes mellitus with microalbuminuria, with long-term current use of insulin (HCC) - Hemoglobin A1c noted at 10.9  (06/08/2020 ) -Repeat hemoglobin A1c 6.4 this visit  -CBGs ranging from 90-2 41 -Continue to hold home regimen hypoglycemic agents.   -SSI.   Thrombocytopenia (HCC) Chronic. -Platelet count slowly trending down but seemed initially to be plateauing and seemed to trend back up but then did drop slightly Recent Labs  Lab 08/08/22 1132 08/09/22 0603 08/10/22 0544 08/11/22 0649 08/12/22 0536 08/13/22 0614  PLT 105* 83* 85* 86* 97* 86*  -Patient with no overt bleeding. -Follow in the outpatient setting.  CAD (coronary artery disease) No evidence of acute EKG changes and patient is chest pain-free.  Troponin leak is likely in the setting of CKD. -Continue home regimen aspirin. -ARB on hold due to AKI and soft blood pressure. -Entresto added per cardiology. -Monitor renal function. -Per cardiology and outpatient follow up  Constipation -Sorbitol every 3 hours x 2  doses. -Place on Senokot-S twice daily.  Pancytopenia -CBC Trend: Recent Labs  Lab 08/08/22 1132 08/09/22 0603 08/10/22 0544 08/11/22 0649 08/12/22 0536 08/13/22 0614  WBC 4.4 3.1* 3.1* 3.4* 3.7* 3.3*  HGB 9.0* 8.3* 8.1* 8.9* 9.1* 9.1*  HCT 28.8* 25.8* 25.1* 26.5* 27.5* 27.7*  PLT 105* 83* 85* 86* 97* 86*  -Continue to monitor and trend and repeat CBC in outpatient setting   Consultants: Cardiology, nephrology, pulmonology Procedures performed: Echocardiogram Disposition: Home Diet recommendation:  Discharge Diet Orders (From admission, onward)     Start     Ordered   08/13/22 0000  Diet - low sodium heart healthy        08/13/22 1326   08/13/22 0000  Diet Carb Modified        08/13/22 1326            Cardiac and Carb modified diet DISCHARGE MEDICATION: Allergies as of 08/13/2022       Reactions   Pioglitazone Swelling   Furosemide Other (See Comments)   Pt may have had swelling in his face per spouse.  Drug discontinued    Metformin And Related Diarrhea   Prednisone    Other Reaction(s): Other (See Comments) Face swells up and severe bleeding   Atorvastatin Rash   Benazepril Rash   Latex Rash   (tape only)   Other Rash   Pt reported that he is allergic to Ace wrap.  Reaction include severe skin irritation.   Tape Rash        Medication List     STOP taking these medications    losartan 25 MG tablet Commonly known as: COZAAR       TAKE these medications    aspirin EC 81 MG tablet Take 81 mg by mouth daily.   FreeStyle Libre 14 Day Reader Kerrin Mo Use 1 Device as directed Dx: E11.29   FreeStyle Libre 14 Day Sensor Misc SMARTSIG:1 Kit(s) Topical Every 2 Weeks   glipiZIDE 5 MG tablet Commonly known as: GLUCOTROL Take 5 mg by mouth daily before breakfast.   insulin degludec 100 UNIT/ML FlexTouch Pen Commonly known as: TRESIBA Inject 10 Units into the skin daily.   meclizine 25 MG tablet Commonly known as: ANTIVERT Take 1 tablet (25 mg total) by mouth 3 (three) times daily as needed for dizziness.   metoprolol succinate 25 MG 24 hr tablet Commonly known as: TOPROL-XL Take 12.5 mg by mouth daily.   nitroGLYCERIN 0.4 MG SL tablet Commonly known as: NITROSTAT Place 0.4 mg under the tongue every 5 (five) minutes as needed for chest pain.   senna-docusate 8.6-50 MG tablet Commonly known as: Senokot-S Take 1 tablet by mouth 2 (two) times daily.   spironolactone 25 MG tablet Commonly known as: ALDACTONE Take 1 tablet (25 mg total) by mouth daily. Start taking on: August 20, 2022 What changed:  medication strength how much to take These instructions start on August 20, 2022. If you are unsure what to do until then, ask your doctor or other care  provider.   tamsulosin 0.4 MG Caps capsule Commonly known as: FLOMAX Take 0.4 mg by mouth daily.   Torsemide 40 MG Tabs Take 40 mg by mouth 2 (two) times daily. What changed:  medication strength how much to take Another medication with the same name was removed. Continue taking this medication, and follow the directions you see here.        Discharge Exam: [facility] Weights   08/10/22 2137 08/12/22 0353  Weight:  79.8 kg 80.1 kg   Vitals:   08/13/22 0746 08/13/22 1204  BP: 108/61 111/62  Pulse: 83 81  Resp: 18 20  Temp: 97.8 F (36.6 C) 97.7 F (36.5 C)  SpO2: 99% 97%   Examination: Physical Exam:  Constitutional: WN/WD elderly chronically ill-appearing Caucasian male currently no acute distress appears calm and feels back to his baseline Respiratory: Diminished to auscultation bilaterally, no wheezing, rales, rhonchi or crackles. Normal respiratory effort and patient is not tachypenic. No accessory muscle use.  Unlabored breathing Cardiovascular: RRR, no murmurs / rubs / gallops. S1 and S2 auscultated.  Has 1+ lower extremity edema Abdomen: Soft, non-tender, mildly just ended.. Bowel sounds positive.  GU: Deferred. Musculoskeletal: No clubbing / cyanosis of digits/nails. No joint deformity upper and lower extremities.  Skin: No rashes, lesions, ulcers on limited skin evaluation. No induration; Warm and dry.  Neurologic: CN 2-12 grossly intact with no focal deficits.  Romberg sign and cerebellar reflexes not assessed.  Psychiatric: Normal judgment and insight. Alert and oriented x 3. Normal mood and appropriate affect.   Condition at discharge: stable  The results of significant diagnostics from this hospitalization (including imaging, microbiology, ancillary and laboratory) are listed below for reference.   Imaging Studies: ECHOCARDIOGRAM COMPLETE  Result Date: 08/10/2022    ECHOCARDIOGRAM REPORT   Patient Name:   ZAXTON ANGERER Date of Exam: 08/10/2022 Medical  Rec #:  [id number]           Height:       73.0 in Accession #:    [id number]          Weight:       184.0 lb Date of Birth:  09-25-1945           BSA:          2.077 m Patient Age:    24 years            BP:           133/70 mmHg Patient Gender: M                   HR:           71 bpm. Exam Location:  [facility] Procedure: 2D Echo, Color Doppler and Cardiac Doppler Indications:     I50.9 Congestive Heart Failure  History:         Patient has prior history of Echocardiogram examinations, most                  recent 02/08/2020. Defibrillator, CKD and COPD; Risk                  Factors:Hypertension and Diabetes.  Sonographer:     Charmayne Sheer Referring Phys:  [place] Diagnosing Phys: [place]  1. Left ventricular ejection fraction, by estimation, is <20%. The left ventricle has severely decreased function. The left ventricle demonstrates global hypokinesis. The left ventricular internal cavity size was severely dilated. There is moderate left  ventricular hypertrophy. Left ventricular diastolic parameters are consistent with Grade III diastolic dysfunction (restrictive).  2. Right ventricular systolic function is mildly reduced. The right ventricular size is moderately enlarged.  3. Left atrial size was severely dilated.  4. The mitral valve is normal in structure. Mild to moderate mitral valve regurgitation. No evidence of mitral stenosis.  5. Tricuspid valve regurgitation is mild to moderate.  6. The aortic valve is normal in structure. Aortic valve regurgitation is mild to  moderate. No aortic stenosis is present.  7. The inferior vena cava is normal in size with greater than 50% respiratory variability, suggesting right atrial pressure of 3 mmHg. Conclusion(s)/Recommendation(s): Findings consistent with dilated cardiomyopathy. FINDINGS  Left Ventricle: Left ventricular ejection fraction, by estimation, is <20%. The left ventricle has severely decreased function. The left ventricle demonstrates  global hypokinesis. The left ventricular internal cavity size was severely dilated. There is moderate left ventricular hypertrophy. Left ventricular diastolic parameters are consistent with Grade III diastolic dysfunction (restrictive). Right Ventricle: The right ventricular size is moderately enlarged. No increase in right ventricular wall thickness. Right ventricular systolic function is mildly reduced. Left Atrium: Left atrial size was severely dilated. Right Atrium: Right atrial size was normal in size. Pericardium: There is no evidence of pericardial effusion. Mitral Valve: The mitral valve is normal in structure. Mild to moderate mitral valve regurgitation. No evidence of mitral valve stenosis. Tricuspid Valve: The tricuspid valve is normal in structure. Tricuspid valve regurgitation is mild to moderate. No evidence of tricuspid stenosis. Aortic Valve: The aortic valve is normal in structure. Aortic valve regurgitation is mild to moderate. Aortic regurgitation PHT measures 568 msec. No aortic stenosis is present. Aortic valve mean gradient measures 6.0 mmHg. Aortic valve peak gradient measures 12.7 mmHg. Aortic valve area, by VTI measures 1.94 cm. Pulmonic Valve: The pulmonic valve was normal in structure. Pulmonic valve regurgitation is trivial. No evidence of pulmonic stenosis. Aorta: The aortic root is normal in size and structure. Venous: The inferior vena cava is normal in size with greater than 50% respiratory variability, suggesting right atrial pressure of 3 mmHg. IAS/Shunts: No atrial level shunt detected by color flow Doppler.  LEFT VENTRICLE PLAX 2D LVIDd:         6.10 cm      Diastology LVIDs:         5.70 cm      LV e' medial:    4.35 cm/s LV PW:         1.30 cm      LV E/e' medial:  22.3 LV IVS:        0.80 cm      LV e' lateral:   4.90 cm/s LVOT diam:     2.00 cm      LV E/e' lateral: 19.8 LV SV:         71 LV SV Index:   34 LVOT Area:     3.14 cm  LV Volumes (MOD) LV vol d, MOD A4C: 196.0 ml  LV vol s, MOD A4C: 166.0 ml LV SV MOD A4C:     196.0 ml RIGHT VENTRICLE RV Basal diam:  4.80 cm RV S prime:     5.87 cm/s LEFT ATRIUM             Index        RIGHT ATRIUM           Index LA diam:        4.50 cm 2.17 cm/m   RA Area:     17.80 cm LA Vol (A2C):   63.3 ml 30.48 ml/m  RA Volume:   47.60 ml  22.92 ml/m LA Vol (A4C):   75.2 ml 36.21 ml/m LA Biplane Vol: 69.1 ml 33.27 ml/m  AORTIC VALVE                     PULMONIC VALVE AV Area (Vmax):    2.19 cm      PV Vmax:  1.17 m/s AV Area (Vmean):   2.03 cm      PV Vmean:         74.200 cm/s AV Area (VTI):     1.94 cm      PV VTI:           0.240 m AV Vmax:           178.00 cm/s   PV Peak grad:     5.5 mmHg AV Vmean:          118.000 cm/s  PV Mean grad:     3.0 mmHg AV VTI:            0.367 m       PR End Diast Vel: 7.40 msec AV Peak Grad:      12.7 mmHg AV Mean Grad:      6.0 mmHg LVOT Vmax:         124.00 cm/s LVOT Vmean:        76.100 cm/s LVOT VTI:          0.227 m LVOT/AV VTI ratio: 0.62 AI PHT:            568 msec  AORTA Ao Root diam: 3.20 cm MITRAL VALVE               TRICUSPID VALVE MV Area (PHT): 3.48 cm    TR Peak grad:   38.7 mmHg MV Decel Time: 218 msec    TR Vmax:        311.00 cm/s MR Peak grad: 85.4 mmHg MR Vmax:      462.00 cm/s  SHUNTS MV E velocity: 96.90 cm/s  Systemic VTI:  0.23 m MV A velocity: 55.00 cm/s  Systemic Diam: 2.00 cm MV E/A ratio:  1.76 Shaukat Khan Electronically signed by Neoma Laming Signature Date/Time: 08/10/2022/12:25:36 PM    Final    CT CHEST WO CONTRAST  Result Date: 08/08/2022 CLINICAL DATA:  Pleural effusion EXAM: CT CHEST WITHOUT CONTRAST TECHNIQUE: Multidetector CT imaging of the chest was performed following the standard protocol without IV contrast. RADIATION DOSE REDUCTION: This exam was performed according to the departmental dose-optimization program which includes automated exposure control, adjustment of the mA and/or kV according to patient size and/or use of iterative reconstruction  technique. COMPARISON:  Previous CT done on 03/03/2022, chest radiographs including the examination done today FINDINGS: Cardiovascular: Heart is enlarged in size. Coronary artery calcifications are seen. Pacemaker battery is seen in left infraclavicular region with biventricular leads. There is previous coronary bypass surgery. Mediastinum/Nodes: No new significant lymphadenopathy is seen. Lungs/Pleura: There are patchy infiltrates in the lower lung fields, more so on the left side. There is interval clearing of faint ground-glass nodular infiltrate in right upper lobe since 03/03/2022. Small to moderate bilateral pleural effusions are seen, more so on the left side. Portions of left pleural effusion appears to be loculated. There is interval increase in amount of bilateral pleural effusions. There are no pockets of air in the pleural effusions. There is no pneumothorax. Upper Abdomen: Arterial calcifications are seen. Surgical clips are seen in gallbladder fossa. Musculoskeletal: No acute findings are seen. IMPRESSION: Small to moderate bilateral pleural effusions, more so on the left side with interval increase. Part of the left pleural effusion appears to be loculated. There are patchy infiltrates in both lower lung fields, more so on the left side suggesting atelectasis/pneumonia. Cardiomegaly.  Coronary artery disease. Other findings as described in the body of the report. Electronically Signed  By: Elmer Picker M.D.   On: 08/08/2022 20:06   DG Chest 2 View  Result Date: 08/08/2022 CLINICAL DATA:  Shortness of breath. EXAM: CHEST - 2 VIEW COMPARISON:  07/18/2022 FINDINGS: Lungs are hyperexpanded. The cardio pericardial silhouette is enlarged. Retrocardiac collapse/consolidation is similar to prior. Tiny bilateral pleural effusions evident. Bones are diffusely demineralized. Left-sided permanent pacemaker/AICD again noted. IMPRESSION: 1. Hyperexpansion with persistent retrocardiac  collapse/consolidation. 2. Tiny bilateral pleural effusions. Electronically Signed   By: Misty Stanley M.D.   On: 08/08/2022 12:06    Microbiology: Results for orders placed or performed during the hospital encounter of 03/25/22  Fungus Culture With Stain     Status: None   Collection Time: 03/25/22 10:20 AM   Specimen: PATH Cytology Pleural fluid  Result Value Ref Range Status   Fungus Stain Final report  Final   Fungus (Mycology) Culture Final report  Final    Comment: (NOTE) Performed At: [facility] [place], [place] [id number] Rush Farmer MD MN:[id number]    Fungal Source PLEURAL  Final    Comment: Performed at [facility], [address] [place], [place] [address]  Acid Fast Culture with reflexed sensitivities     Status: None   Collection Time: 03/25/22 10:20 AM   Specimen: PATH Cytology Pleural fluid  Result Value Ref Range Status   Acid Fast Culture Negative  Final    Comment: (NOTE) No acid fast bacilli isolated after 6 weeks. Performed At: [facility] [place], [place] [id number] Rush Farmer MD [id number]    Source of Sample PLEURAL  Final    Comment: Performed at [facility], [place], [place] [address]  Acid Fast Smear (AFB)     Status: None   Collection Time: 03/25/22 10:20 AM   Specimen: PATH Cytology Pleural fluid  Result Value Ref Range Status   AFB Specimen Processing Concentration  Final   Acid Fast Smear Negative  Final    Comment: (NOTE) Performed At: [facility] [place], [place] [id number] Rush Farmer MD [id number]    Source (AFB) PLEURAL  Final    Comment: Performed at [facility], [address] [place], [place] [address]  Fungus Culture Result     Status: None   Collection Time: 03/25/22 10:20 AM  Result Value Ref Range Status   Result 1 Comment  Final    Comment: (NOTE) KOH/Calcofluor preparation:  no fungus  observed. Performed At: [facility] [place], [place] [id number] Rush Farmer MD [id number]   Fungal organism reflex     Status: None   Collection Time: 03/25/22 10:20 AM  Result Value Ref Range Status   Fungal result 1 Comment  Final    Comment: (NOTE) No yeast or mold isolated after 4 weeks. Performed At: [facility] [place], [place] [id number] Rush Farmer MD [id number]    Labs: CBC: Recent Labs  Lab 08/11/22 0649 08/12/22 0536 08/13/22 0614  WBC 3.4* 3.7* 3.3*  NEUTROABS 2.4  --   --   HGB 8.9* 9.1* 9.1*  HCT 26.5* 27.5* 27.7*  MCV 103.5* 103.0* 103.0*  PLT 86* 97* 86*   Basic Metabolic Panel: Recent Labs  Lab 08/11/22 0649 08/12/22 0536 08/13/22 0614  NA 143 141 142  K 4.2 4.1 4.0  CL 113* 107 109  CO2 _0 GLUCOSE 121* 146* 170*  BUN 82* 79* 77*  CREATININE 2.38* 2.55* 2.25*  CALCIUM 8.7* 8.7* 8.8*  MG 2.2 2.2 2.2  PHOS 4.9*  --  4.3   Liver Function Tests: Recent Labs  Lab 08/11/22 0649 08/13/22 0614  ALBUMIN 3.5 3.2*   CBG: Recent Labs  Lab 08/12/22 1134 08/12/22 1607 08/12/22 2030 08/13/22 0745 08/13/22 1201  GLUCAP 267* 90 156* 137* 241*    Discharge time spent: greater than 30 minutes.  Signed: Raiford Noble, DO [facility] 08/17/2022

## 2022-08-27 NOTE — Progress Notes (Unsigned)
Patient ID: Rick Zuniga, male    DOB: [birth year], 77 y.o.   MRN: [id number]  HPI  Rick Zuniga is a 77 y/o male with a history of DM, HTN, CKD, COPD, thrombocytopenia and chronic heart failure.   Echo done 08/10/22 showed an EF of <20% along with moderate LVH, severe LAE and mild/ moderate Rick/ TR/ AR. Echo report from 02/08/20 reviewed and showed an EF of 40-45% along with mild LAE.  Admitted 08/08/22 due to a/c HF. Diuresed, echo done.    He presents today for a f/u visit although hasn't been seen since 03/2020. He presents with a chief complaint of minimal fatigue with moderate exertion. Describes this as chronic in nature. Has associated shortness of breath, pedal edema & light-headedness along with this. Denies any difficulty sleeping, abdominal distention, palpitations, chest pain, cough or weight gain.   Has CRT-D for biventricular HF. Implanted 05/2022.  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 4 vessel   CATARACT EXTRACTION W/PHACO Left 08/07/2021   Procedure: CATARACT EXTRACTION PHACO AND INTRAOCULAR LENS PLACEMENT (IOC) LEFT DIABETIC 9.06 01:42.2;  Surgeon: Leandrew Koyanagi, MD;  Location: [place];  Service: Ophthalmology;  Laterality: Left;  Diabetic   CATARACT EXTRACTION W/PHACO Right 07/02/2022   Procedure: CATARACT EXTRACTION PHACO AND INTRAOCULAR LENS PLACEMENT (IOC) RIGHT VISION BLUE HEALON 5 DIABETIC  10.62  01:16.7;  Surgeon: Leandrew Koyanagi, MD;  Location: [place];  Service: Ophthalmology;  Laterality: Right;   CATARACT EXTRACTION W/PHACO Right 07/16/2022   Procedure: REMOVAL OF LENS FRAMENTS RIGHT DIABETIC;  Surgeon: Leandrew Koyanagi, MD;  Location: [place];  Service: Ophthalmology;  Laterality: Right;  patient wants last   CHOLECYSTECTOMY     COLONOSCOPY  12/23/2010   IR THORACENTESIS ASP PLEURAL SPACE W/IMG GUIDE  07/18/2022   THORACENTESIS     02/25/21, 10/17/21   Family History  Problem Relation Age of Onset   CAD Mother    Kidney failure Father    Diabetes Father    Social History   Tobacco Use   Smoking status: Never   Smokeless tobacco: Never  Substance Use Topics   Alcohol use: No   Allergies  Allergen Reactions   Pioglitazone Swelling   Furosemide Other (See Comments)    Pt may have had swelling in his face per spouse.  Drug discontinued     Metformin And Related Diarrhea   Prednisone     Other Reaction(s): Other (See Comments)  Face swells up and severe bleeding   Atorvastatin Rash   Benazepril Rash   Latex Rash    (tape only)   Other Rash    Pt reported that he is allergic to Ace wrap.  Reaction include severe skin irritation.   Tape Rash   Prior to Admission medications   Medication Sig Start Date End Date Taking? Authorizing Provider  aspirin EC 81 MG tablet Take 81 mg by mouth daily.   Yes [provider]   Continuous Blood Gluc Receiver (FREESTYLE LIBRE 14 DAY READER) DEVI Use 1 Device as directed Dx: E11.29 02/22/20  Yes [provider]  Continuous Blood Gluc Sensor (FREESTYLE LIBRE 14 DAY SENSOR) MISC SMARTSIG:1 Kit(s) Topical Every 2 Weeks 10/29/20  Yes [provider]  glipiZIDE (GLUCOTROL) 5 MG tablet Take 5 mg by mouth daily before breakfast. 07/28/22  Yes [provider]  insulin degludec (TRESIBA) 100 UNIT/ML FlexTouch Pen Inject 10 Units into the skin daily.   Yes [provider]  tamsulosin (FLOMAX) 0.4 MG CAPS capsule Take 0.4 mg by mouth daily.   Yes [provider]  acetaminophen (TYLENOL) 325 MG tablet Take 325 mg by mouth every 4 (four) hours as needed.    [provider]  meclizine (ANTIVERT) 25 MG tablet Take 1 tablet (25 mg total) by mouth 3 (three) times daily as needed for dizziness. Patient not taking: Reported on 08/28/2022 03/02/20   Sharen Hones, MD  metoprolol succinate (TOPROL-XL) 25 MG 24 hr tablet Take 0.5 tablets (12.5 mg total) by mouth daily. 08/28/22   Alisa Graff, FNP  nitroGLYCERIN (NITROSTAT) 0.4 MG SL tablet Place 0.4 mg under the tongue every 5 (five) minutes as needed for chest pain. Patient not taking: Reported on 08/28/2022    [provider]  potassium chloride (KLOR-CON) 10 MEQ tablet Take 1 tablet (10 mEq total) by mouth 2 (two) times daily. 08/28/22   Alisa Graff, FNP  sacubitril-valsartan (ENTRESTO) 24-26 MG Take 1 tablet by mouth 2 (two) times daily. 08/28/22   Alisa Graff, FNP  senna-docusate (SENOKOT-S) 8.6-50 MG tablet Take 1 tablet by mouth 2 (two) times daily. Patient not taking: Reported on 08/28/2022 08/13/22   Raiford Noble Latif, DO  spironolactone (ALDACTONE) 25 MG tablet Take 1 tablet (25 mg total) by mouth daily. 08/28/22   Alisa Graff, FNP  torsemide (DEMADEX) 20 MG tablet Take 2 tablets (40 mg total) by mouth 2 (two) times daily. 08/28/22   Alisa Graff, FNP   Review of  Systems  Constitutional:  Positive for fatigue. Negative for appetite change.  HENT:  Negative for congestion, postnasal drip and sore throat.   Eyes: Negative.   Respiratory:  Positive for shortness of breath (with moderate exertion). Negative for cough and chest tightness.   Cardiovascular:  Positive for leg swelling (left ankle at times). Negative for chest pain and palpitations.  Gastrointestinal:  Negative for abdominal distention and abdominal pain.  Endocrine: Negative.   Genitourinary: Negative.   Musculoskeletal:  Negative for back pain and neck pain.  Skin: Negative.   Allergic/Immunologic: Negative.   Neurological:  Positive for light-headedness. Negative for dizziness.  Hematological:  Negative for adenopathy. Does not bruise/bleed easily.  Psychiatric/Behavioral:  Negative for dysphoric mood and sleep disturbance (sleeping on 2 pillows). The patient is not nervous/anxious.    Vitals:  08/28/22 1024  BP: (!) 112/56  Pulse: 63  Resp: 16  SpO2: 98%  Weight: 173 lb (78.5 kg)   Wt Readings from Last 3 Encounters:  08/28/22 173 lb (78.5 kg)  08/12/22 176 lb 9.4 oz (80.1 kg)  07/16/22 184 lb (83.5 kg)   Lab Results  Component Value Date   CREATININE 2.25 (H) 08/13/2022   CREATININE 2.55 (H) 08/12/2022   CREATININE 2.38 (H) 08/11/2022   Physical Exam Vitals and nursing note reviewed. Exam conducted with a chaperone present (SO).  Constitutional:      Appearance: Normal appearance.  HENT:     Head: Normocephalic and atraumatic.  Cardiovascular:     Rate and Rhythm: Normal rate and regular rhythm.  Pulmonary:     Effort: Pulmonary effort is normal. No respiratory distress.     Breath sounds: No wheezing or rales.  Abdominal:     General: There is no distension.     Palpations: Abdomen is soft.     Tenderness: There is no abdominal tenderness.  Musculoskeletal:        General: No tenderness.     Cervical back: Normal range of motion and neck supple.     Right  lower leg: Edema (trace pitting) present.     Left lower leg: Edema (1+pitting) present.  Skin:    General: Skin is warm and dry.  Neurological:     General: No focal deficit present.     Mental Status: He is alert and oriented to person, place, and time.  Psychiatric:        Mood and Affect: Mood normal.        Behavior: Behavior normal.        Thought Content: Thought content normal.     Assessment & Plan:  1: Chronic heart failure with reduced ejection fraction- - NYHA class II - euvolemic today - weighing daily; reminded to call for an overnight weight gain of >2 pounds or a weekly weight gain of >5 pounds - not adding salt to his food - GDMT of metoprolol, entresto and spironolactone - consider SGLT2 - saw cardiology ([place]) 06/05/22 - saw EP Marcello Moores) 07/04/22 - CRT-D implanted 05/2022 - refer to [facility] for further assessment - encouraged to wear compression socks daily with removal at bedtime; also encouraged him to elevate his legs when sitting for long periods of time - BNP 08/08/22 was 3152.8 - PharmD reconciled meds w/ patient and SO  2: HTN- - BP 112/56 - saw PCP Edwina Barth) 08/08/22 - BMP 08/13/22 reviewed and showed sodium 142, potassium 4.0, creatinine 2.25 and GFR 29  3: DM with CKD- - A1c 08/10/22 was 6.4% - saw nephrology Holley Raring) 05/14/22 - fasting glucose at home today was 74  4: Pleural effusion- - saw pulmonology Lanney Gins) 08/21/22   Patient did not bring his medications nor a list. Each medication was verbally reviewed with the patient and he was encouraged to bring the bottles to every visit to confirm accuracy of list.  Return in 3 weeks, sooner if needed.

## 2022-08-28 ENCOUNTER — Encounter: Payer: Self-pay | Admitting: Pharmacist

## 2022-08-28 ENCOUNTER — Other Ambulatory Visit (HOSPITAL_COMMUNITY): Payer: Self-pay

## 2022-08-28 ENCOUNTER — Other Ambulatory Visit: Payer: Self-pay

## 2022-08-28 ENCOUNTER — Encounter: Payer: Self-pay | Admitting: Family

## 2022-08-28 ENCOUNTER — Ambulatory Visit: Payer: Medicare Other | Attending: Family | Admitting: Family

## 2022-08-28 VITALS — BP 112/56 | HR 63 | Resp 16 | Wt 173.0 lb

## 2022-08-28 DIAGNOSIS — I10 Essential (primary) hypertension: Secondary | ICD-10-CM

## 2022-08-28 DIAGNOSIS — J449 Chronic obstructive pulmonary disease, unspecified: Secondary | ICD-10-CM | POA: Insufficient documentation

## 2022-08-28 DIAGNOSIS — N189 Chronic kidney disease, unspecified: Secondary | ICD-10-CM | POA: Diagnosis not present

## 2022-08-28 DIAGNOSIS — E1136 Type 2 diabetes mellitus with diabetic cataract: Secondary | ICD-10-CM | POA: Diagnosis not present

## 2022-08-28 DIAGNOSIS — E1122 Type 2 diabetes mellitus with diabetic chronic kidney disease: Secondary | ICD-10-CM | POA: Insufficient documentation

## 2022-08-28 DIAGNOSIS — I130 Hypertensive heart and chronic kidney disease with heart failure and stage 1 through stage 4 chronic kidney disease, or unspecified chronic kidney disease: Secondary | ICD-10-CM | POA: Diagnosis present

## 2022-08-28 DIAGNOSIS — N184 Chronic kidney disease, stage 4 (severe): Secondary | ICD-10-CM

## 2022-08-28 DIAGNOSIS — I5022 Chronic systolic (congestive) heart failure: Secondary | ICD-10-CM | POA: Diagnosis not present

## 2022-08-28 DIAGNOSIS — J90 Pleural effusion, not elsewhere classified: Secondary | ICD-10-CM | POA: Diagnosis not present

## 2022-08-28 DIAGNOSIS — Z794 Long term (current) use of insulin: Secondary | ICD-10-CM

## 2022-08-28 MED ORDER — METOPROLOL SUCCINATE ER 25 MG PO TB24
12.5000 mg | ORAL_TABLET | Freq: Every day | ORAL | 3 refills | Status: AC
  Filled 2022-08-28: qty 45, 90d supply, fill #0

## 2022-08-28 MED ORDER — TORSEMIDE 20 MG PO TABS
40.0000 mg | ORAL_TABLET | Freq: Two times a day (BID) | ORAL | 3 refills | Status: DC
  Filled 2022-08-28: qty 360, 90d supply, fill #0

## 2022-08-28 MED ORDER — SACUBITRIL-VALSARTAN 24-26 MG PO TABS
1.0000 | ORAL_TABLET | Freq: Two times a day (BID) | ORAL | 3 refills | Status: AC
  Filled 2022-08-28: qty 60, 30d supply, fill #0

## 2022-08-28 MED ORDER — POTASSIUM CHLORIDE ER 10 MEQ PO TBCR
10.0000 meq | EXTENDED_RELEASE_TABLET | Freq: Two times a day (BID) | ORAL | 3 refills | Status: AC
  Filled 2022-08-28: qty 180, 90d supply, fill #0

## 2022-08-28 MED ORDER — SPIRONOLACTONE 25 MG PO TABS
25.0000 mg | ORAL_TABLET | Freq: Every day | ORAL | 3 refills | Status: AC
  Filled 2022-08-28: qty 90, 90d supply, fill #0

## 2022-08-28 NOTE — Patient Instructions (Addendum)
Continue weighing daily and call for an overnight weight gain of 3 pounds or more or a weekly weight gain of more than 5 pounds.   If you have voicemail, please make sure your mailbox is cleaned out so that we may leave a message and please make sure to listen to any voicemails.    Put compression socks on every morning and remove them before bedtime

## 2022-08-29 ENCOUNTER — Telehealth (HOSPITAL_COMMUNITY): Payer: Self-pay | Admitting: Licensed Clinical Social Worker

## 2022-08-29 ENCOUNTER — Other Ambulatory Visit: Payer: Self-pay

## 2022-08-29 ENCOUNTER — Other Ambulatory Visit (HOSPITAL_COMMUNITY): Payer: Self-pay

## 2022-08-29 NOTE — Telephone Encounter (Signed)
H&V Care Navigation CSW Progress Note  Clinical Social Worker received call back from pt wife who states they have spoken with multiple people regarding part D but that nothing has worked out.  CSW was going to refer to [employer] in [place] to speak with SHIP but pt wife states they have already worked closely with someone there named Vivien Rota.  States they met with her and completed paperwork that she turned in somewhere to get him insurance.  It becomes unclear the status of all this though cause wife states something about hearing that [facility] didn't offer those services anymore and is under the impression that they didn't end up getting insurance but can't explain the details.  CSW given permission to call Vivien Rota to discuss directly- tried to reach at [phone] but was told she only works on Thursdays- they will send her a message to reach out to me before then to discuss.   SDOH Screenings   Food Insecurity: No Food Insecurity (08/10/2022)  Housing: Low Risk  (08/10/2022)  Transportation Needs: No Transportation Needs (08/10/2022)  Utilities: Not At Risk (08/10/2022)  Tobacco Use: Low Risk  (08/28/2022)    [name], [facility] Desk#: [phone] Cell#: [phone]

## 2022-08-29 NOTE — Telephone Encounter (Signed)
CSW consulted to speak with pt and his wife regarding applying for Medicare part D.  CSW attempted to reach pt and wife to discuss- unable to reach- left VM requesting return call  [name], [facility] Desk#: [phone] Cell#: [phone]

## 2022-09-01 ENCOUNTER — Other Ambulatory Visit: Payer: Self-pay

## 2022-09-01 ENCOUNTER — Telehealth (HOSPITAL_COMMUNITY): Payer: Self-pay | Admitting: Licensed Clinical Social Worker

## 2022-09-01 NOTE — Telephone Encounter (Signed)
H&V Care Navigation CSW Progress Note  Clinical Social Worker received call back from [employer].  CSW able to confirm that she was helping the pt apply for part D but that there had been an issue with the Medicare number not registering- CSW able to verify correct number and sounds like there had been a typo preventing the application from being processed.  Now that she has the correct number will follow up with the pt and pt wife to apply for part D.  CSW spoke with wife to inform.   SDOH Screenings   Food Insecurity: No Food Insecurity (08/10/2022)  Housing: Low Risk  (08/10/2022)  Transportation Needs: No Transportation Needs (08/10/2022)  Utilities: Not At Risk (08/10/2022)  Tobacco Use: Low Risk  (08/28/2022)   [name], [facility] Desk#: [phone] Cell#: [phone]

## 2022-09-02 ENCOUNTER — Other Ambulatory Visit: Payer: Self-pay

## 2022-09-03 ENCOUNTER — Other Ambulatory Visit: Payer: Self-pay

## 2022-09-04 ENCOUNTER — Other Ambulatory Visit: Payer: Self-pay

## 2022-09-10 ENCOUNTER — Other Ambulatory Visit: Payer: Self-pay

## 2022-09-10 NOTE — Progress Notes (Signed)
Patient ID: Rick Zuniga, male   DOB: Jul 17, 1946, 77 y.o.   MRN: [id number]  [place] COUNSELING NOTE  Guideline-Directed Medical Therapy/Evidence Based Medicine  ACE/ARB/ARNI: Sacubitril-valsartan 24-26 mg twice daily Beta Blocker: Metoprolol succinate 12.5 mg daily Aldosterone Antagonist: Spironolactone 25 mg daily Diuretic: Torsemide 20 mg twice daily SGLT2i:  none  Adherence Assessment  Do you ever forget to take your medication? [] Yes [x] No  Do you ever skip doses due to side effects? [] Yes [x] No  Do you have trouble affording your medicines? [x] Yes [] No  Are you ever unable to pick up your medication due to transportation difficulties? [] Yes [x] No  Do you ever stop taking your medications because you don't believe they are helping? [] Yes [x] No  Do you check your weight daily? [] Yes [x] No   Adherence strategy: pill box  Barriers to obtaining medications: Medicare A & B only. Application for part D pending  Vital signs: HR 63, BP 112/56, weight (pounds) 173 lbs  ECHO: 08/10/22 ,EF of <20% along with moderate LVH, severe LAE and mild/ moderate MR/ TR/ AR.       Latest Ref Rng & Units 08/13/2022    6:14 AM 08/12/2022    5:36 AM 08/11/2022    6:49 AM  BMP  Glucose 70 - 99 mg/dL 170  146  121   BUN 8 - 23 mg/dL 77  79  82   Creatinine 0.61 - 1.24 mg/dL 2.25  2.55  2.38   Sodium 135 - 145 mmol/L 142  141  143   Potassium 3.5 - 5.1 mmol/L 4.0  4.1  4.2   Chloride 98 - 111 mmol/L 109  107  113   CO2 22 - 32 mmol/L 27  23  22    Calcium 8.9 - 10.3 mg/dL 8.8  8.7  8.7     Past Medical History:  Diagnosis Date   AICD (automatic cardioverter/defibrillator) present    Arthritis    Cataract    Mixed OU   CHF (congestive heart failure) (HCC)    Chronic kidney disease    Complication of anesthesia    Difficulty inserting IV, wants expert at IV's, no multiple attempts   COPD (chronic obstructive  pulmonary disease) ([place])    COVID-19 05/23/2022   Diabetes (HCC)    Type 2   Diabetic retinopathy (HCC)    NPDR OU   Heart disease    Hypertension    Hypertensive retinopathy    OU   Indigestion    Presence of permanent cardiac pacemaker    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 PMH includes DM, HTN, CKD, COPD, thrombocytopenia and chronic heart failure. Last acute care admission on 08/10/22 due to acute HF.  [facility] completed during visit. Patient referred to mediation Management while waiting for medicare part D approval. Will send current Rx  to [facility] and samples for [facility] [place] provided until patient assistance process completed.  PLAN  Add SGLT2i to GDMT Repeat BMET NP to send Rx to [facility] I already talked to Medication Management team to complete Entresto and [place] applications for patient assistance as well.    Time spent: 30 minutes  Allyssia Skluzacek Rodriguez-Guzman PharmD, BCPS 09/10/2022 8:33 AM    Current Outpatient Medications:    acetaminophen (TYLENOL)  325 MG tablet, Take 325 mg by mouth every 4 (four) hours as needed., Disp: , Rfl:    aspirin EC 81 MG tablet, Take 81 mg by mouth daily., Disp: , Rfl:    Continuous Blood Gluc Receiver (FREESTYLE LIBRE 14 DAY READER) DEVI, Use 1 Device as directed Dx: E11.29, Disp: , Rfl:    Continuous Blood Gluc Sensor (FREESTYLE LIBRE 14 DAY SENSOR) MISC, SMARTSIG:1 Kit(s) Topical Every 2 Weeks, Disp: , Rfl:    glipiZIDE (GLUCOTROL) 5 MG tablet, Take 5 mg by mouth daily before breakfast., Disp: , Rfl:    insulin degludec (TRESIBA) 100 UNIT/ML FlexTouch Pen, Inject 10 Units into the skin daily., Disp: , Rfl:    meclizine (ANTIVERT) 25 MG tablet, Take 1 tablet (25 mg total) by mouth 3 (three) times daily as needed for dizziness. (Patient not taking: Reported on  08/28/2022), Disp: 30 tablet, Rfl: 0   metoprolol succinate (TOPROL-XL) 25 MG 24 hr tablet, Take 0.5 tablets (12.5 mg total) by mouth daily., Disp: 45 tablet, Rfl: 3   nitroGLYCERIN (NITROSTAT) 0.4 MG SL tablet, Place 0.4 mg under the tongue every 5 (five) minutes as needed for chest pain. (Patient not taking: Reported on 08/28/2022), Disp: , Rfl:    potassium chloride (KLOR-CON) 10 MEQ tablet, Take 1 tablet (10 mEq total) by mouth 2 (two) times daily., Disp: 180 tablet, Rfl: 3   sacubitril-valsartan (ENTRESTO) 24-26 MG, Take 1 tablet by mouth 2 (two) times daily., Disp: 180 tablet, Rfl: 3   senna-docusate (SENOKOT-S) 8.6-50 MG tablet, Take 1 tablet by mouth 2 (two) times daily. (Patient not taking: Reported on 08/28/2022), Disp: 30 tablet, Rfl: 0   spironolactone (ALDACTONE) 25 MG tablet, Take 1 tablet (25 mg total) by mouth daily., Disp: 90 tablet, Rfl: 3   tamsulosin (FLOMAX) 0.4 MG CAPS capsule, Take 0.4 mg by mouth daily., Disp: , Rfl:    torsemide (DEMADEX) 20 MG tablet, Take 2 tablets (40 mg total) by mouth 2 (two) times daily., Disp: 360 tablet, Rfl: 3    MEDICATION ADHERENCES TIPS AND STRATEGIES Taking medication as prescribed improves patient outcomes in heart failure (reduces hospitalizations, improves symptoms, increases survival) Side effects of medications can be managed by decreasing doses, switching agents, stopping drugs, or adding additional therapy. Please let someone in the [facility] know if you have having bothersome side effects so we can modify your regimen. Do not alter your medication regimen without talking to [place].  Medication reminders can help patients remember to take drugs on time. If you are missing or forgetting doses you can try linking behaviors, using pill boxes, or an electronic reminder like an alarm on your phone or an app. Some people can also get automated phone calls as medication reminders.

## 2022-09-18 ENCOUNTER — Encounter: Payer: Self-pay | Admitting: Internal Medicine

## 2022-09-18 ENCOUNTER — Ambulatory Visit: Payer: Medicare Other | Attending: Internal Medicine | Admitting: Internal Medicine

## 2022-09-18 VITALS — BP 128/75 | HR 63 | Ht 73.0 in | Wt 165.2 lb

## 2022-09-18 DIAGNOSIS — R079 Chest pain, unspecified: Secondary | ICD-10-CM | POA: Insufficient documentation

## 2022-09-18 DIAGNOSIS — I5082 Biventricular heart failure: Secondary | ICD-10-CM | POA: Insufficient documentation

## 2022-09-18 DIAGNOSIS — N184 Chronic kidney disease, stage 4 (severe): Secondary | ICD-10-CM | POA: Diagnosis not present

## 2022-09-18 DIAGNOSIS — E1136 Type 2 diabetes mellitus with diabetic cataract: Secondary | ICD-10-CM | POA: Diagnosis not present

## 2022-09-18 DIAGNOSIS — I447 Left bundle-branch block, unspecified: Secondary | ICD-10-CM | POA: Insufficient documentation

## 2022-09-18 DIAGNOSIS — I130 Hypertensive heart and chronic kidney disease with heart failure and stage 1 through stage 4 chronic kidney disease, or unspecified chronic kidney disease: Secondary | ICD-10-CM | POA: Diagnosis not present

## 2022-09-18 DIAGNOSIS — Z79899 Other long term (current) drug therapy: Secondary | ICD-10-CM | POA: Diagnosis not present

## 2022-09-18 DIAGNOSIS — R531 Weakness: Secondary | ICD-10-CM | POA: Insufficient documentation

## 2022-09-18 DIAGNOSIS — E1122 Type 2 diabetes mellitus with diabetic chronic kidney disease: Secondary | ICD-10-CM | POA: Insufficient documentation

## 2022-09-18 DIAGNOSIS — J449 Chronic obstructive pulmonary disease, unspecified: Secondary | ICD-10-CM | POA: Insufficient documentation

## 2022-09-18 DIAGNOSIS — Z794 Long term (current) use of insulin: Secondary | ICD-10-CM | POA: Insufficient documentation

## 2022-09-18 DIAGNOSIS — R634 Abnormal weight loss: Secondary | ICD-10-CM | POA: Insufficient documentation

## 2022-09-18 DIAGNOSIS — I5022 Chronic systolic (congestive) heart failure: Secondary | ICD-10-CM | POA: Insufficient documentation

## 2022-09-18 DIAGNOSIS — I428 Other cardiomyopathies: Secondary | ICD-10-CM | POA: Insufficient documentation

## 2022-09-18 DIAGNOSIS — Z8616 Personal history of COVID-19: Secondary | ICD-10-CM | POA: Insufficient documentation

## 2022-09-18 DIAGNOSIS — Z951 Presence of aortocoronary bypass graft: Secondary | ICD-10-CM | POA: Insufficient documentation

## 2022-09-18 DIAGNOSIS — R630 Anorexia: Secondary | ICD-10-CM | POA: Diagnosis not present

## 2022-09-18 DIAGNOSIS — I2510 Atherosclerotic heart disease of native coronary artery without angina pectoris: Secondary | ICD-10-CM | POA: Diagnosis not present

## 2022-09-18 DIAGNOSIS — Z7984 Long term (current) use of oral hypoglycemic drugs: Secondary | ICD-10-CM | POA: Diagnosis not present

## 2022-09-18 NOTE — Patient Instructions (Signed)
It was great to see you today No Medication changes are need    Follow up with our office as needed    At the [facility], you and your health needs are our priority. As part of our continuing mission to provide you with exceptional heart care, we have created designated Provider Care Teams. These Care Teams include your primary Cardiologist (physician) and Advanced Practice Providers (APPs- Physician Assistants and Nurse Practitioners) who all work together to provide you with the care you need, when you need it.   You may see any of the following providers on your designated Care Team at your next follow up: Dr Glori Bickers Dr Loralie Champagne Dr. Roxana Hires, NP Lyda Jester, [place] [facility] [place], [place] Forestine Na, NP Audry Riles, PharmD   Please be sure to bring in all your medications bottles to every appointment.    Thank you for choosing [facility]

## 2022-09-18 NOTE — Progress Notes (Signed)
ADVANCED HF CLINIC CONSULT NOTE  Referring Provider: Darylene Price, NP Primary Care: Rick Hire, MD Primary Cardiologist: Dr. 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    He is referred by Rick Zuniga for further evaluation of his HF.   He has been followed closely by the [facility] team. He was referred to Dr. Mosetta Zuniga in the [facility] in 8/23 for consideration of advanced HF therapies. Felt likely not to be candidate for VAD due to RV dysfunction, CKD and previous sternotomy. TEE and RHC arranged.   TEE 8/23 EF < 15% mod-severe Rick  severe RV dysfunction RHC 2/23 RA 12 PA 68/30 (43) PCWP 22 CI 2.4 PVR 4.3  No angiography due to CKD IV with Scr 2.5-3.2   Subsequently underwent CRT implantation in 10/23   Admitted 08/08/22 due to a/c HF. Ec ho done 08/10/22 showed an EF of <20% along with moderate LVH, severe LAE and mild/ moderate Rick/ TR/ AR. Diuresed well   He is here with his girlfriend who previously worked with Hospice. Says he is keeping fluid off. (Was on lasix 40 bid but recently decreased to 20 bid due to progressive weight loss). Unfortunately he continues to deteriorate with NYHA IIIb-IV symptoms including anorexia, extreme fatigue and weight loss. Has lost 90 pounds over the past 5 years. Says he can do ADLs but too weak to walk to mailbox. Gets CP about 3x week which resolve spontaneously   ICD interrogation. No VT/AF.Biv pacing 86% Activity level 1-2 hours    Review of Systems: [y] = yes, [ ]  = no   General: Weight gain [ ] ; Weight loss [ y]; Anorexia [ y]; Fatigue [name] ]; Fever [ ] ; Chills [ ] ; Weakness [name] ]  Cardiac: Chest pain/pressure [name] ]; Resting SOB [ ] ; Exertional SOB [ y]; Orthopnea [ ] ; Pedal Edema [ ] ; Palpitations [ ] ; Syncope [ ] ; Presyncope [ ] ; Paroxysmal  nocturnal dyspnea[ ]   Pulmonary: Cough [ ] ; Wheezing[ ] ; Hemoptysis[ ] ; Sputum [ ] ; Snoring [ ]   GI: Vomiting[ ] ; Dysphagia[ ] ; Melena[ ] ; Hematochezia [ ] ; Heartburn[ ] ; Abdominal pain [ ] ; Constipation [ ] ; Diarrhea [ ] ; BRBPR [ ]   GU: Hematuria[ ] ; Dysuria [ ] ; Nocturia[ ]   Vascular: Pain in legs with walking [ ] ; Pain in feet with lying flat [ ] ; Non-healing sores [ ] ; Stroke [ ] ; TIA [ ] ; Slurred speech [ ] ;  Neuro: Headaches[ ] ; Vertigo[ ] ; Seizures[ ] ; Paresthesias[ ] ;Blurred vision [ ] ; Diplopia [ ] ; Vision changes [ ]   Ortho/Skin: Arthritis [name] ]; Joint pain [ y]; Muscle pain [ ] ; Joint swelling [ ] ; Back Pain [ ] ; Rash [ ]   Psych: Depression[ ] ; Anxiety[ ]   Heme: Bleeding problems [ ] ; Clotting disorders [ ] ; Anemia [ ]   Endocrine: Diabetes [ y]; Thyroid dysfunction[ ]    Past Medical History:  Diagnosis Date   AICD (automatic cardioverter/defibrillator) present    Arthritis    Cataract    Mixed OU   CHF (congestive heart failure) (HCC)    Chronic kidney disease    Complication of anesthesia    Difficulty inserting IV, wants expert at IV's, no multiple attempts   COPD (chronic obstructive pulmonary disease) ([place])    COVID-19 05/23/2022   Diabetes ([place])  Type 2   Diabetic retinopathy (HCC)    NPDR OU   Heart disease    Hypertension    Hypertensive retinopathy    OU   Indigestion    Presence of permanent cardiac pacemaker    Recurrent pleural effusion on left    Thrombocytopenia (HCC)    Wears dentures    full upper and lower    Current Outpatient Medications  Medication Sig Dispense Refill   acetaminophen (TYLENOL) 325 MG tablet Take 325 mg by mouth every 4 (four) hours as needed.     aspirin EC 81 MG tablet Take 81 mg by mouth daily.     Continuous Blood Gluc Receiver (FREESTYLE LIBRE 14 DAY READER) DEVI Use 1 Device as directed Dx: E11.29     Continuous Blood Gluc Sensor (FREESTYLE LIBRE 14 DAY SENSOR) MISC SMARTSIG:1 Kit(s) Topical Every 2 Weeks     glipiZIDE  (GLUCOTROL) 5 MG tablet Take 5 mg by mouth daily before breakfast.     insulin degludec (TRESIBA) 100 UNIT/ML FlexTouch Pen Inject 10 Units into the skin daily.     metoprolol succinate (TOPROL-XL) 25 MG 24 hr tablet Take 0.5 tablets (12.5 mg total) by mouth daily. 45 tablet 3   potassium chloride (KLOR-CON) 10 MEQ tablet Take 1 tablet (10 mEq total) by mouth 2 (two) times daily. 180 tablet 3   sacubitril-valsartan (ENTRESTO) 24-26 MG Take 1 tablet by mouth 2 (two) times daily. 180 tablet 3   senna-docusate (SENOKOT-S) 8.6-50 MG tablet Take 1 tablet by mouth 2 (two) times daily. 30 tablet 0   spironolactone (ALDACTONE) 25 MG tablet Take 1 tablet (25 mg total) by mouth daily. 90 tablet 3   tamsulosin (FLOMAX) 0.4 MG CAPS capsule Take 0.4 mg by mouth daily.     torsemide (DEMADEX) 20 MG tablet Take by mouth daily. 20 mg in the AM and 20 mg in the PM as needed     meclizine (ANTIVERT) 25 MG tablet Take 1 tablet (25 mg total) by mouth 3 (three) times daily as needed for dizziness. (Patient not taking: Reported on 09/18/2022) 30 tablet 0   nitroGLYCERIN (NITROSTAT) 0.4 MG SL tablet Place 0.4 mg under the tongue every 5 (five) minutes as needed for chest pain. (Patient not taking: Reported on 08/28/2022)     No current facility-administered medications for this visit.    Allergies  Allergen Reactions   Pioglitazone Swelling   Furosemide Other (See Comments)    Pt may have had swelling in his face per spouse.  Drug discontinued     Metformin And Related Diarrhea   Prednisone     Other Reaction(s): Other (See Comments)  Face swells up and severe bleeding   Atorvastatin Rash   Benazepril Rash   Latex Rash    (tape only)   Other Rash    Pt reported that he is allergic to Ace wrap.  Reaction include severe skin irritation.   Tape Rash      Social History   Socioeconomic History   Marital status: Single    Spouse name: Not on file   Number of children: Not on file   Years of education: Not  on file   Highest education level: Not on file  Occupational History   Not on file  Tobacco Use   Smoking status: Never   Smokeless tobacco: Never  Vaping Use   Vaping Use: Never used  Substance and Sexual Activity   Alcohol use: No   Drug use: No  Sexual activity: Not on file  Other Topics Concern   Not on file  Social History Narrative   Not on file   Social Determinants of Health   Financial Resource Strain: Not on file  Food Insecurity: No Food Insecurity (08/10/2022)   Hunger Vital Sign    Worried About Running Out of Food in the Last Year: Never true    Ran Out of Food in the Last Year: Never true  Transportation Needs: No Transportation Needs (08/10/2022)   PRAPARE - Hydrologist (Medical): No    Lack of Transportation (Non-Medical): No  Physical Activity: Not on file  Stress: Not on file  Social Connections: Not on file  Intimate Partner Violence: Not At Risk (08/10/2022)   Humiliation, Afraid, Rape, and Kick questionnaire    Fear of Current or Ex-Partner: No    Emotionally Abused: No    Physically Abused: No    Sexually Abused: No      Family History  Problem Relation Age of Onset   CAD Mother    Kidney failure Father    Diabetes Father     Vitals:   09/18/22 1129  BP: 128/75  Pulse: 63  SpO2: 100%  Weight: 165 lb 3.2 oz (74.9 kg)  Height: 6\' 1"  (1.854 m)    PHYSICAL EXAM: General:  Weak appearing. No respiratory difficulty HEENT: normal Neck: supple. no JVD. Carotids 2+ bilat; no bruits. No lymphadenopathy or thryomegaly appreciated. Cor: PMI laterally displaced. Regular rate & rhythm. No rubs, gallops or murmurs. Lungs: clear Abdomen: soft, nontender, nondistended. No hepatosplenomegaly. No bruits or masses. Good bowel sounds. Extremities: no cyanosis, clubbing, rash, edema Neuro: alert & oriented x 3, cranial nerves grossly intact. moves all 4 extremities w/o difficulty. Affect pleasant.  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 Severe Rick - s/p CRT in 10/23  - NYHA class IIIB-IV - Volume status ok  - GDMT limited by low BP and CKD IV - Long talk with him and his GF today about his situation. Unfortunately I think he has end-stage biventricular HF and given com-morbidities and RF failure not candidate for VAD. I suggested that he will likely need to consider Palliative Care services but he has an appt with Dr. Mosetta Zuniga later this month and I think it would be best to revisit with the [place] team who has provided all of his care before making any final decisions   2: CAD s/p CABG - having occasional mild CP - not candidate for angiography due to CKD IV  3. CKD IV - baseline Scr 2.5-3.2 - followed by Nephrology  4. LBBB - s/p CRT - ICD interrogation as above  Total time spent 45 minutes. Over half that time spent discussing above.     Glori Bickers, MD  12:52 PM

## 2022-09-19 ENCOUNTER — Encounter: Payer: Self-pay | Admitting: Internal Medicine

## 2022-09-30 ENCOUNTER — Inpatient Hospital Stay: Payer: Medicare Other

## 2022-09-30 ENCOUNTER — Other Ambulatory Visit: Payer: Self-pay

## 2022-09-30 ENCOUNTER — Inpatient Hospital Stay
Admission: EM | Admit: 2022-09-30 | Discharge: 2022-10-17 | DRG: 308 | Disposition: E | Payer: Medicare Other | Attending: Pulmonary Disease | Admitting: Pulmonary Disease

## 2022-09-30 ENCOUNTER — Emergency Department: Payer: Medicare Other

## 2022-09-30 DIAGNOSIS — J449 Chronic obstructive pulmonary disease, unspecified: Secondary | ICD-10-CM | POA: Diagnosis present

## 2022-09-30 DIAGNOSIS — N184 Chronic kidney disease, stage 4 (severe): Secondary | ICD-10-CM | POA: Diagnosis present

## 2022-09-30 DIAGNOSIS — J9600 Acute respiratory failure, unspecified whether with hypoxia or hypercapnia: Secondary | ICD-10-CM | POA: Diagnosis present

## 2022-09-30 DIAGNOSIS — Z1152 Encounter for screening for COVID-19: Secondary | ICD-10-CM

## 2022-09-30 DIAGNOSIS — N170 Acute kidney failure with tubular necrosis: Secondary | ICD-10-CM | POA: Diagnosis present

## 2022-09-30 DIAGNOSIS — E1121 Type 2 diabetes mellitus with diabetic nephropathy: Secondary | ICD-10-CM | POA: Diagnosis present

## 2022-09-30 DIAGNOSIS — Z86718 Personal history of other venous thrombosis and embolism: Secondary | ICD-10-CM

## 2022-09-30 DIAGNOSIS — Z794 Long term (current) use of insulin: Secondary | ICD-10-CM | POA: Diagnosis not present

## 2022-09-30 DIAGNOSIS — I2510 Atherosclerotic heart disease of native coronary artery without angina pectoris: Secondary | ICD-10-CM | POA: Diagnosis present

## 2022-09-30 DIAGNOSIS — I469 Cardiac arrest, cause unspecified: Secondary | ICD-10-CM | POA: Diagnosis present

## 2022-09-30 DIAGNOSIS — E113293 Type 2 diabetes mellitus with mild nonproliferative diabetic retinopathy without macular edema, bilateral: Secondary | ICD-10-CM | POA: Diagnosis present

## 2022-09-30 DIAGNOSIS — Z8249 Family history of ischemic heart disease and other diseases of the circulatory system: Secondary | ICD-10-CM

## 2022-09-30 DIAGNOSIS — Z7984 Long term (current) use of oral hypoglycemic drugs: Secondary | ICD-10-CM

## 2022-09-30 DIAGNOSIS — I5023 Acute on chronic systolic (congestive) heart failure: Secondary | ICD-10-CM | POA: Diagnosis present

## 2022-09-30 DIAGNOSIS — Z66 Do not resuscitate: Secondary | ICD-10-CM | POA: Diagnosis present

## 2022-09-30 DIAGNOSIS — D6959 Other secondary thrombocytopenia: Secondary | ICD-10-CM | POA: Diagnosis present

## 2022-09-30 DIAGNOSIS — D631 Anemia in chronic kidney disease: Secondary | ICD-10-CM | POA: Diagnosis present

## 2022-09-30 DIAGNOSIS — E1165 Type 2 diabetes mellitus with hyperglycemia: Secondary | ICD-10-CM | POA: Diagnosis present

## 2022-09-30 DIAGNOSIS — E1122 Type 2 diabetes mellitus with diabetic chronic kidney disease: Secondary | ICD-10-CM | POA: Diagnosis present

## 2022-09-30 DIAGNOSIS — E872 Acidosis, unspecified: Secondary | ICD-10-CM | POA: Diagnosis present

## 2022-09-30 DIAGNOSIS — G9341 Metabolic encephalopathy: Secondary | ICD-10-CM | POA: Diagnosis present

## 2022-09-30 DIAGNOSIS — I428 Other cardiomyopathies: Secondary | ICD-10-CM | POA: Diagnosis present

## 2022-09-30 DIAGNOSIS — K7200 Acute and subacute hepatic failure without coma: Secondary | ICD-10-CM | POA: Diagnosis present

## 2022-09-30 DIAGNOSIS — I255 Ischemic cardiomyopathy: Secondary | ICD-10-CM | POA: Diagnosis present

## 2022-09-30 DIAGNOSIS — R4182 Altered mental status, unspecified: Secondary | ICD-10-CM | POA: Diagnosis not present

## 2022-09-30 DIAGNOSIS — E875 Hyperkalemia: Secondary | ICD-10-CM | POA: Diagnosis present

## 2022-09-30 DIAGNOSIS — Z79899 Other long term (current) drug therapy: Secondary | ICD-10-CM

## 2022-09-30 DIAGNOSIS — I5084 End stage heart failure: Secondary | ICD-10-CM | POA: Diagnosis present

## 2022-09-30 DIAGNOSIS — R4020 Unspecified coma: Secondary | ICD-10-CM | POA: Diagnosis present

## 2022-09-30 DIAGNOSIS — Z7982 Long term (current) use of aspirin: Secondary | ICD-10-CM

## 2022-09-30 DIAGNOSIS — Z833 Family history of diabetes mellitus: Secondary | ICD-10-CM

## 2022-09-30 DIAGNOSIS — Z95810 Presence of automatic (implantable) cardiac defibrillator: Secondary | ICD-10-CM

## 2022-09-30 DIAGNOSIS — I700 Atherosclerosis of aorta: Secondary | ICD-10-CM | POA: Diagnosis present

## 2022-09-30 DIAGNOSIS — I462 Cardiac arrest due to underlying cardiac condition: Secondary | ICD-10-CM | POA: Diagnosis present

## 2022-09-30 DIAGNOSIS — I130 Hypertensive heart and chronic kidney disease with heart failure and stage 1 through stage 4 chronic kidney disease, or unspecified chronic kidney disease: Secondary | ICD-10-CM | POA: Diagnosis present

## 2022-09-30 DIAGNOSIS — I4901 Ventricular fibrillation: Principal | ICD-10-CM | POA: Diagnosis present

## 2022-09-30 DIAGNOSIS — Z8616 Personal history of COVID-19: Secondary | ICD-10-CM | POA: Diagnosis not present

## 2022-09-30 DIAGNOSIS — I5082 Biventricular heart failure: Secondary | ICD-10-CM | POA: Diagnosis present

## 2022-09-30 DIAGNOSIS — R570 Cardiogenic shock: Secondary | ICD-10-CM | POA: Diagnosis present

## 2022-09-30 DIAGNOSIS — Z951 Presence of aortocoronary bypass graft: Secondary | ICD-10-CM

## 2022-09-30 LAB — CBC WITH DIFFERENTIAL/PLATELET
Abs Immature Granulocytes: 0.12 10*3/uL — ABNORMAL HIGH (ref 0.00–0.07)
Basophils Absolute: 0 10*3/uL (ref 0.0–0.1)
Basophils Relative: 1 %
Eosinophils Absolute: 0.1 10*3/uL (ref 0.0–0.5)
Eosinophils Relative: 1 %
HCT: 31.8 % — ABNORMAL LOW (ref 39.0–52.0)
Hemoglobin: 10.6 g/dL — ABNORMAL LOW (ref 13.0–17.0)
Immature Granulocytes: 2 %
Lymphocytes Relative: 26 %
Lymphs Abs: 1.6 10*3/uL (ref 0.7–4.0)
MCH: 33.4 pg (ref 26.0–34.0)
MCHC: 33.3 g/dL (ref 30.0–36.0)
MCV: 100.3 fL — ABNORMAL HIGH (ref 80.0–100.0)
Monocytes Absolute: 0.4 10*3/uL (ref 0.1–1.0)
Monocytes Relative: 7 %
Neutro Abs: 3.8 10*3/uL (ref 1.7–7.7)
Neutrophils Relative %: 63 %
Platelets: 87 10*3/uL — ABNORMAL LOW (ref 150–400)
RBC: 3.17 MIL/uL — ABNORMAL LOW (ref 4.22–5.81)
RDW: 11.9 % (ref 11.5–15.5)
WBC: 6.1 10*3/uL (ref 4.0–10.5)
nRBC: 0 % (ref 0.0–0.2)

## 2022-09-30 LAB — BLOOD GAS, VENOUS
Acid-base deficit: 18.6 mmol/L — ABNORMAL HIGH (ref 0.0–2.0)
Bicarbonate: 12.1 mmol/L — ABNORMAL LOW (ref 20.0–28.0)
Drawn by: NEGATIVE
O2 Saturation: 72.8 %
Patient temperature: 37
pCO2, Ven: 47 mmHg (ref 44–60)
pH, Ven: 7.02 — CL (ref 7.25–7.43)
pO2, Ven: 47 mmHg — ABNORMAL HIGH (ref 32–45)

## 2022-09-30 LAB — COMPREHENSIVE METABOLIC PANEL
ALT: 210 U/L — ABNORMAL HIGH (ref 0–44)
AST: 167 U/L — ABNORMAL HIGH (ref 15–41)
Albumin: 3.6 g/dL (ref 3.5–5.0)
Alkaline Phosphatase: 67 U/L (ref 38–126)
Anion gap: 12 (ref 5–15)
BUN: 94 mg/dL — ABNORMAL HIGH (ref 8–23)
CO2: 13 mmol/L — ABNORMAL LOW (ref 22–32)
Calcium: 8 mg/dL — ABNORMAL LOW (ref 8.9–10.3)
Chloride: 113 mmol/L — ABNORMAL HIGH (ref 98–111)
Creatinine, Ser: 3.37 mg/dL — ABNORMAL HIGH (ref 0.61–1.24)
GFR, Estimated: 18 mL/min — ABNORMAL LOW (ref >60)
Glucose, Bld: 291 mg/dL — ABNORMAL HIGH (ref 70–99)
Potassium: 5.7 mmol/L — ABNORMAL HIGH (ref 3.5–5.1)
Sodium: 138 mmol/L (ref 135–145)
Total Bilirubin: 0.9 mg/dL (ref 0.3–1.2)
Total Protein: 6.4 g/dL — ABNORMAL LOW (ref 6.5–8.1)

## 2022-09-30 LAB — RESP PANEL BY RT-PCR (RSV, FLU A&B, COVID)  RVPGX2
Influenza A by PCR: NEGATIVE
Influenza B by PCR: NEGATIVE
Resp Syncytial Virus by PCR: NEGATIVE
SARS Coronavirus 2 by RT PCR: NEGATIVE

## 2022-09-30 LAB — CBC
HCT: 32.2 % — ABNORMAL LOW (ref 39.0–52.0)
Hemoglobin: 10.9 g/dL — ABNORMAL LOW (ref 13.0–17.0)
MCH: 33.1 pg (ref 26.0–34.0)
MCHC: 33.9 g/dL (ref 30.0–36.0)
MCV: 97.9 fL (ref 80.0–100.0)
Platelets: 85 10*3/uL — ABNORMAL LOW (ref 150–400)
RBC: 3.29 MIL/uL — ABNORMAL LOW (ref 4.22–5.81)
RDW: 12 % (ref 11.5–15.5)
WBC: 7.6 10*3/uL (ref 4.0–10.5)
nRBC: 0 % (ref 0.0–0.2)

## 2022-09-30 LAB — BLOOD GAS, ARTERIAL
Acid-base deficit: 17.8 mmol/L — ABNORMAL HIGH (ref 0.0–2.0)
Bicarbonate: 11.2 mmol/L — ABNORMAL LOW (ref 20.0–28.0)
FIO2: 1 %
MECHVT: 470 mL
O2 Saturation: 100 %
PEEP: 5 cmH2O
Patient temperature: 37
RATE: 16 resp/min
pCO2 arterial: 37 mmHg (ref 32–48)
pH, Arterial: 7.09 — CL (ref 7.35–7.45)
pO2, Arterial: 351 mmHg — ABNORMAL HIGH (ref 83–108)

## 2022-09-30 LAB — BASIC METABOLIC PANEL
Anion gap: 12 (ref 5–15)
BUN: 94 mg/dL — ABNORMAL HIGH (ref 8–23)
CO2: 13 mmol/L — ABNORMAL LOW (ref 22–32)
Calcium: 8.7 mg/dL — ABNORMAL LOW (ref 8.9–10.3)
Chloride: 114 mmol/L — ABNORMAL HIGH (ref 98–111)
Creatinine, Ser: 3.27 mg/dL — ABNORMAL HIGH (ref 0.61–1.24)
GFR, Estimated: 19 mL/min — ABNORMAL LOW (ref >60)
Glucose, Bld: 309 mg/dL — ABNORMAL HIGH (ref 70–99)
Potassium: 6 mmol/L — ABNORMAL HIGH (ref 3.5–5.1)
Sodium: 139 mmol/L (ref 135–145)

## 2022-09-30 LAB — TROPONIN I (HIGH SENSITIVITY)
Troponin I (High Sensitivity): 76 ng/L — ABNORMAL HIGH (ref <18)
Troponin I (High Sensitivity): 415 ng/L (ref <18)
Troponin I (High Sensitivity): 870 ng/L (ref <18)

## 2022-09-30 LAB — BRAIN NATRIURETIC PEPTIDE: B Natriuretic Peptide: 520.9 pg/mL — ABNORMAL HIGH (ref 0.0–100.0)

## 2022-09-30 LAB — PHOSPHORUS: Phosphorus: 6 mg/dL — ABNORMAL HIGH (ref 2.5–4.6)

## 2022-09-30 LAB — GLUCOSE, CAPILLARY
Glucose-Capillary: 161 mg/dL — ABNORMAL HIGH (ref 70–99)
Glucose-Capillary: 316 mg/dL — ABNORMAL HIGH (ref 70–99)

## 2022-09-30 LAB — LACTIC ACID, PLASMA
Lactic Acid, Venous: 2.8 mmol/L (ref 0.5–1.9)
Lactic Acid, Venous: 5.2 mmol/L (ref 0.5–1.9)

## 2022-09-30 LAB — PROTIME-INR
INR: 1.3 — ABNORMAL HIGH (ref 0.8–1.2)
Prothrombin Time: 16.4 seconds — ABNORMAL HIGH (ref 11.4–15.2)

## 2022-09-30 LAB — MAGNESIUM: Magnesium: 2.6 mg/dL — ABNORMAL HIGH (ref 1.7–2.4)

## 2022-09-30 LAB — POTASSIUM: Potassium: 5.9 mmol/L — ABNORMAL HIGH (ref 3.5–5.1)

## 2022-09-30 LAB — MRSA NEXT GEN BY PCR, NASAL: MRSA by PCR Next Gen: NOT DETECTED

## 2022-09-30 LAB — PROCALCITONIN: Procalcitonin: <0.1 ng/mL

## 2022-09-30 MED ORDER — DEXTROSE 50 % IV SOLN
12.5000 g | Freq: Once | INTRAVENOUS | Status: AC
  Administered 2022-09-30: 12.5 g via INTRAVENOUS
  Filled 2022-09-30: qty 50

## 2022-09-30 MED ORDER — ACETAMINOPHEN 325 MG PO TABS
650.0000 mg | ORAL_TABLET | ORAL | Status: DC | PRN
  Administered 2022-10-01: 650 mg
  Filled 2022-09-30: qty 2

## 2022-09-30 MED ORDER — AMIODARONE IV BOLUS ONLY 150 MG/100ML
INTRAVENOUS | Status: AC
  Administered 2022-09-30: 150 mg
  Administered 2022-09-30: 33.3 mg
  Filled 2022-09-30: qty 100

## 2022-09-30 MED ORDER — NOREPINEPHRINE 4 MG/250ML-% IV SOLN: 2.0000 ug/min | INTRAVENOUS | Status: DC

## 2022-09-30 MED ORDER — SODIUM BICARBONATE 8.4 % IV SOLN
100.0000 meq | Freq: Once | INTRAVENOUS | Status: AC
  Administered 2022-09-30: 100 meq via INTRAVENOUS
  Filled 2022-09-30: qty 100

## 2022-09-30 MED ORDER — INSULIN ASPART 100 UNIT/ML IV SOLN
10.0000 [IU] | Freq: Once | INTRAVENOUS | Status: AC
  Administered 2022-09-30: 10 [IU] via INTRAVENOUS
  Filled 2022-09-30: qty 0.1

## 2022-09-30 MED ORDER — AMIODARONE HCL IN DEXTROSE 360-4.14 MG/200ML-% IV SOLN
INTRAVENOUS | Status: AC
  Filled 2022-09-30: qty 200

## 2022-09-30 MED ORDER — FAMOTIDINE 20 MG PO TABS
20.0000 mg | ORAL_TABLET | Freq: Two times a day (BID) | ORAL | Status: DC
  Administered 2022-09-30: 20 mg
  Filled 2022-09-30: qty 1

## 2022-09-30 MED ORDER — LACTATED RINGERS IV BOLUS: 500.0000 mL | Freq: Once | INTRAVENOUS | Status: DC

## 2022-09-30 MED ORDER — MIDAZOLAM HCL 2 MG/2ML IJ SOLN
2.0000 mg | Freq: Once | INTRAMUSCULAR | Status: AC
  Administered 2022-09-30: 2 mg via INTRAVENOUS
  Filled 2022-09-30: qty 2

## 2022-09-30 MED ORDER — FENTANYL 2500MCG IN NS 250ML (10MCG/ML) PREMIX INFUSION
0.0000 ug/h | INTRAVENOUS | Status: DC
  Administered 2022-10-01: 100 ug/h via INTRAVENOUS
  Filled 2022-09-30: qty 250

## 2022-09-30 MED ORDER — LACTATED RINGERS IV BOLUS
400.0000 mL | Freq: Once | INTRAVENOUS | Status: AC
  Administered 2022-09-30: 400 mL via INTRAVENOUS

## 2022-09-30 MED ORDER — MIDAZOLAM HCL 2 MG/2ML IJ SOLN
1.0000 mg | INTRAMUSCULAR | Status: DC | PRN
  Administered 2022-10-02 (×2): 2 mg via INTRAVENOUS
  Filled 2022-09-30 (×2): qty 2

## 2022-09-30 MED ORDER — INSULIN ASPART 100 UNIT/ML IJ SOLN
0.0000 [IU] | INTRAMUSCULAR | Status: DC
  Administered 2022-09-30: 15 [IU] via SUBCUTANEOUS
  Administered 2022-10-01: 4 [IU] via SUBCUTANEOUS
  Filled 2022-09-30 (×2): qty 1

## 2022-09-30 MED ORDER — AMIODARONE HCL IN DEXTROSE 360-4.14 MG/200ML-% IV SOLN
60.0000 mg/h | INTRAVENOUS | Status: AC
  Administered 2022-09-30: 60 mg/h via INTRAVENOUS
  Filled 2022-09-30: qty 200

## 2022-09-30 MED ORDER — SODIUM ZIRCONIUM CYCLOSILICATE 5 G PO PACK
10.0000 g | PACK | Freq: Once | ORAL | Status: AC
  Administered 2022-09-30: 10 g
  Filled 2022-09-30: qty 2

## 2022-09-30 MED ORDER — NOREPINEPHRINE 16 MG/250ML-% IV SOLN
0.0000 ug/min | INTRAVENOUS | Status: DC
  Administered 2022-09-30: 10 ug/min via INTRAVENOUS
  Administered 2022-10-01: 12 ug/min via INTRAVENOUS
  Filled 2022-09-30 (×3): qty 250

## 2022-09-30 MED ORDER — INSULIN ASPART 100 UNIT/ML IJ SOLN
0.0000 [IU] | INTRAMUSCULAR | Status: DC
  Administered 2022-09-30: 7 [IU] via SUBCUTANEOUS
  Filled 2022-09-30: qty 1

## 2022-09-30 MED ORDER — NOREPINEPHRINE 4 MG/250ML-% IV SOLN
INTRAVENOUS | Status: AC
  Administered 2022-09-30: 5 ug/min
  Filled 2022-09-30: qty 250

## 2022-09-30 MED ORDER — ORAL CARE MOUTH RINSE: 15.0000 mL | OROMUCOSAL | Status: DC | PRN

## 2022-09-30 MED ORDER — FENTANYL BOLUS VIA INFUSION: 50.0000 ug | INTRAVENOUS | Status: DC | PRN

## 2022-09-30 MED ORDER — ORAL CARE MOUTH RINSE
15.0000 mL | OROMUCOSAL | Status: DC
  Administered 2022-10-01 – 2022-10-02 (×20): 15 mL via OROMUCOSAL

## 2022-09-30 MED ORDER — SODIUM CHLORIDE 0.9 % IV SOLN
250.0000 mL | INTRAVENOUS | Status: DC
  Administered 2022-09-30: 250 mL via INTRAVENOUS

## 2022-09-30 MED ORDER — ASPIRIN 81 MG PO CHEW
81.0000 mg | CHEWABLE_TABLET | Freq: Every day | ORAL | Status: DC
  Administered 2022-10-01: 81 mg
  Filled 2022-09-30: qty 1

## 2022-09-30 MED ORDER — HEPARIN (PORCINE) 25000 UT/250ML-% IV SOLN
1250.0000 [IU]/h | INTRAVENOUS | Status: DC
  Administered 2022-09-30: 1000 [IU]/h via INTRAVENOUS
  Administered 2022-10-01: 1250 [IU]/h via INTRAVENOUS
  Filled 2022-09-30 (×2): qty 250

## 2022-09-30 MED ORDER — LORAZEPAM 2 MG/ML IJ SOLN
2.0000 mg | Freq: Once | INTRAMUSCULAR | Status: AC
  Administered 2022-09-30: 2 mg via INTRAVENOUS
  Filled 2022-09-30: qty 1

## 2022-09-30 MED ORDER — HEPARIN SODIUM (PORCINE) 5000 UNIT/ML IJ SOLN
5000.0000 [IU] | Freq: Three times a day (TID) | INTRAMUSCULAR | Status: DC
  Administered 2022-09-30: 5000 [IU] via SUBCUTANEOUS
  Filled 2022-09-30: qty 1

## 2022-09-30 MED ORDER — SODIUM BICARBONATE 8.4 % IV SOLN
100.0000 meq | Freq: Once | INTRAVENOUS | Status: AC
  Administered 2022-09-30: 100 meq via INTRAVENOUS
  Filled 2022-09-30: qty 50

## 2022-09-30 MED ORDER — DOCUSATE SODIUM 100 MG PO CAPS: 100.0000 mg | ORAL_CAPSULE | Freq: Two times a day (BID) | ORAL | Status: DC | PRN

## 2022-09-30 MED ORDER — STERILE WATER FOR INJECTION IV SOLN
INTRAVENOUS | Status: DC
  Filled 2022-09-30: qty 1000
  Filled 2022-09-30: qty 150
  Filled 2022-09-30: qty 1000

## 2022-09-30 MED ORDER — IPRATROPIUM-ALBUTEROL 0.5-2.5 (3) MG/3ML IN SOLN: 3.0000 mL | Freq: Four times a day (QID) | RESPIRATORY_TRACT | Status: DC | PRN

## 2022-09-30 MED ORDER — AMIODARONE HCL IN DEXTROSE 360-4.14 MG/200ML-% IV SOLN
30.0000 mg/h | INTRAVENOUS | Status: DC
  Administered 2022-09-30 – 2022-10-02 (×3): 30 mg/h via INTRAVENOUS
  Filled 2022-09-30 (×3): qty 200

## 2022-09-30 MED ORDER — POLYETHYLENE GLYCOL 3350 17 G PO PACK: 17.0000 g | PACK | Freq: Every day | ORAL | Status: DC | PRN

## 2022-09-30 NOTE — Progress Notes (Signed)
An USGPIV (ultrasound guided PIV) has been placed for short-term vasopressor infusion. A correctly placed ivWatch must be used when administering Vasopressors. Should this treatment be needed beyond 72 hours, central line access should be obtained.  It will be the responsibility of the bedside nurse to follow best practice to prevent extravasations.   

## 2022-09-30 NOTE — ED Triage Notes (Signed)
Patient had witnessed arrest at [place]; CPR initiated. Initial rhythm with EMS was v-fib; shocked x1, PEA, CPR resumed and achieved ROSC PTA.

## 2022-09-30 NOTE — ED Notes (Signed)
This RN talked to Rick Zuniga about pacemaker interrogation, this RN gave MD brief report about conversation,Stefan states he will fax full interrogation report.

## 2022-09-30 NOTE — Progress Notes (Signed)
PHARMACY CONSULT NOTE - FOLLOW UP  Pharmacy Consult for Electrolyte Monitoring and Replacement   Recent Labs: Potassium (mmol/L)  Date Value  08/13/2022 4.0   Magnesium (mg/dL)  Date Value  08/13/2022 2.2   Calcium (mg/dL)  Date Value  08/13/2022 8.8 (L)   Albumin (g/dL)  Date Value  08/13/2022 3.2 (L)   Phosphorus (mg/dL)  Date Value  08/13/2022 4.3   Sodium (mmol/L)  Date Value  08/13/2022 142     Assessment: 77 year old male presented to ED post witnessed cardiac arrest. PMH includes CHF (EF < 20%) AICD and permanent pacemaker, CABG x4, CKD4, COPD, T2DM, recurrent pleural effusion.   Scr 3.37 (Scr 2.25 in 07/2022)   Goal of Therapy:  K 4-5 Mag > 2 All other electrolytes within normal limits  Plan:  Hyperkalemic K+ 5.7. Dextrose 50% 12.5 grams x1 and Novolog 10 units x 1 ordered by PCCM Follow up K+ 2/13 @1900$   Glean Salvo, PharmD, BCPS Clinical Pharmacist  10/01/2022 2:27 PM

## 2022-09-30 NOTE — Consult Note (Signed)
ANTICOAGULATION CONSULT NOTE - Initial Consult  Pharmacy Consult for heparin infusion - NO BOLUS Indication: atrial fibrillation  Allergies  Allergen Reactions   Pioglitazone Swelling   Furosemide Other (See Comments)    Pt may have had swelling in his face per spouse.  Drug discontinued     Metformin And Related Diarrhea   Prednisone     Other Reaction(s): Other (See Comments)  Face swells up and severe bleeding   Atorvastatin Rash   Benazepril Rash   Latex Rash    (tape only)   Other Rash    Pt reported that he is allergic to Ace wrap.  Reaction include severe skin irritation.   Tape Rash    Patient Measurements: Weight: 75.7 kg (166 lb 14.2 oz) Heparin Dosing Weight: 75.7 kg  Vital Signs: Temp: 95.7 F (35.4 C) (02/13 2015) Temp Source: Bladder (02/13 2000) BP: 112/65 (02/13 2015) Pulse Rate: 107 (02/13 2015)  Labs: Recent Labs    10/14/2022 1327 09/29/2022 1612 09/20/2022 1938  HGB 10.6* 10.9*  --   HCT 31.8* 32.2*  --   PLT 87* 85*  --   LABPROT 16.4*  --   --   INR 1.3*  --   --   CREATININE 3.37* 3.27*  --   TROPONINIHS 76* 415* 870*    Estimated Creatinine Clearance: 20.3 mL/min (A) (by C-G formula based on SCr of 3.27 mg/dL (H)).   Medical History: Past Medical History:  Diagnosis Date   AICD (automatic cardioverter/defibrillator) present    Arthritis    Cataract    Mixed OU   CHF (congestive heart failure) (HCC)    Chronic kidney disease    Complication of anesthesia    Difficulty inserting IV, wants expert at IV's, no multiple attempts   COPD (chronic obstructive pulmonary disease) ([place])    COVID-19 05/23/2022   Diabetes (HCC)    Type 2   Diabetic retinopathy (HCC)    NPDR OU   Heart disease    Hypertension    Hypertensive retinopathy    OU   Indigestion    Presence of permanent cardiac pacemaker    Recurrent pleural effusion on left    Thrombocytopenia (HCC)    Wears dentures    full upper and lower    Medications:  PTA:  N/A Inpatient: heparin infusion (2/13 >>>) Allergies: No AC/APT related allergies  Assessment: 77 year old male with history of thrombocytopenia and AICD presents to ED after witness sudden death episode. Pharmacy conuslted for management of heparin infusion in the setting of suspected ACS.  Date Time aPTT/HL Rate/Comment       Goal of Therapy:  Heparin level 0.3-0.7 units/ml Monitor platelets by anticoagulation protocol: Yes   Plan:  Start heparin infusion at 1000 units/hr Check anti-Xa level in 8 hours and daily once consecutively therapeutic. Continue to monitor H&H and platelets daily while on heparin gtt.   Darrick Penna 10/11/2022,8:44 PM

## 2022-09-30 NOTE — ED Notes (Signed)
1329 Patient in PEA, no pulses present. CPR initiated.  1330 Epi given 1331 Pulses present, ROSC acheived

## 2022-09-30 NOTE — ED Notes (Signed)
1339 75 mg of Rocuronium administered by Vet, RN; Fentanyl gtt initiated at 70mg/hr by JJenny Reichmann RN 1340 Successful intubation by Dr. BCheri Fowler7.5 ETT, 24cm at lip; verified with positive color change and bilateral breath sounds

## 2022-09-30 NOTE — Progress Notes (Signed)
Vt increased to 550

## 2022-09-30 NOTE — ED Provider Notes (Signed)
[facility] Provider Note   Event Date/Time   First MD Initiated Contact with Patient 10/10/2022 1331     (approximate) History  Cardiac Arrest  HPI Rick Zuniga is a 77 y.o. male with an extensive past cardiac history including four-vessel CABG with pacemaker/defibrillator in place who presents via EMS after a cardiac arrest with possible bystander CPR and downtime approximately 8 minutes prior to EMS arrival.  EMS arrived 5 patient in V-fib and after 1 external defibrillation, patient was in PEA for 3 more rounds of CPR before ROSC.  Patient has i-gel airway in place and arrives unresponsive.   Physical Exam  Triage Vital Signs: ED Triage Vitals  Enc Vitals Group     BP 09/22/2022 1327 136/86     Pulse Rate 10/03/2022 1327 97     Resp 09/23/2022 1327 (!) 21     Temp --      Temp src --      SpO2 10/12/2022 1324 100 %     Weight 09/19/2022 1337 166 lb 14.2 oz (75.7 kg)     Height --      Head Circumference --      Peak Flow --      Pain Score --      Pain Loc --      Pain Edu? --      Excl. in [place]? --    Most recent vital signs: Vitals:   10/04/2022 1450 09/27/2022 1455  BP: (!) 131/90 (!) 147/89  Pulse: (!) 102 (!) 108  Resp: 20 19  Temp:    SpO2: 100% 100%   General: Unresponsive on stretcher with i-gel airway in place CV:  Good peripheral perfusion.  Resp:  Occasional spontaneous effort.  I-gel airway in place with BVM Abd:  No distention.  Other:  Elderly Caucasian male laying in bed with occasional spontaneous circulation and with adjuvant airway and being bagged ED Results / Procedures / Treatments  Labs (all labs ordered are listed, but only abnormal results are displayed) Labs Reviewed  BLOOD GAS, ARTERIAL - Abnormal; Notable for the following components:      Result Value   pH, Arterial 7.09 (*)    pO2, Arterial 351 (*)    Bicarbonate 11.2 (*)    Acid-base deficit 17.8 (*)    All other components within normal limits  COMPREHENSIVE  METABOLIC PANEL - Abnormal; Notable for the following components:   Potassium 5.7 (*)    Chloride 113 (*)    CO2 13 (*)    Glucose, Bld 291 (*)    BUN 94 (*)    Creatinine, Ser 3.37 (*)    Calcium 8.0 (*)    Total Protein 6.4 (*)    AST 167 (*)    ALT 210 (*)    GFR, Estimated 18 (*)    All other components within normal limits  PROTIME-INR - Abnormal; Notable for the following components:   Prothrombin Time 16.4 (*)    INR 1.3 (*)    All other components within normal limits  CBC WITH DIFFERENTIAL/PLATELET - Abnormal; Notable for the following components:   RBC 3.17 (*)    Hemoglobin 10.6 (*)    HCT 31.8 (*)    MCV 100.3 (*)    Platelets 87 (*)    Abs Immature Granulocytes 0.12 (*)    All other components within normal limits  TROPONIN I (HIGH SENSITIVITY) - Abnormal; Notable for the following components:   Troponin I (High  Sensitivity) 76 (*)    All other components within normal limits  RESP PANEL BY RT-PCR (RSV, FLU A&B, COVID)  [id number]  BRAIN NATRIURETIC PEPTIDE   EKG ED ECG REPORT I, Naaman Plummer, the attending physician, personally viewed and interpreted this ECG. Date: 10/03/2022 EKG Time: 1327 Rate: 116 Rhythm: Underlying paced rhythm with occasional premature contractions QRS Axis: normal Intervals: normal ST/T Wave abnormalities: normal Narrative Interpretation: Underlying paced rhythm with occasional prima no evidence of acute ischemia RADIOLOGY ED MD interpretation: Single view portable chest x-ray shows ETT tube in adequate positioning and OG tube visualized below the diaphragm as well as mild heterogeneous opacities likely due to pulmonary edema and a small left pleural effusion -Agree with radiology assessment Official radiology report(s): DG Chest 1 View  Result Date: 09/18/2022 CLINICAL DATA:  ETT and OG tube placement EXAM: CHEST  1 VIEW COMPARISON:  Chest x-ray dated August 08, 2022 FINDINGS: Cardiac and mediastinal contours unchanged status post  median sternotomy. Left chest wall pacer unchanged lead position. ETT tip is approximally 5 cm from the carina. OG tube partially visualized coursing below the diaphragm. Mild heterogeneous opacities, likely due to pulmonary edema. Small left pleural effusion. No evidence of pneumothorax IMPRESSION: 1. ETT tip is 5 cm from the carina. 2. OG tube partially visualized coursing below the diaphragm. 3. Mild heterogeneous opacities, likely due to pulmonary edema. 4. Small left pleural effusion. Electronically Signed   By: Yetta Glassman M.D.   On: 09/29/2022 14:47   DG Abdomen 1 View  Result Date: 09/25/2022 CLINICAL DATA:  OG tube placement EXAM: ABDOMEN - 1 VIEW COMPARISON:  02/11/2020. FINDINGS: OG tube tip is in the fundus of the stomach. Nonobstructive bowel gas pattern. IMPRESSION: OG tube in the stomach. Electronically Signed   By: Rolm Baptise M.D.   On: 09/27/2022 14:44   PROCEDURES: Critical Care performed: Yes, see critical care procedure note(s) Procedures MEDICATIONS ORDERED IN ED: Medications  docusate sodium (COLACE) capsule 100 mg (has no administration in time range)  polyethylene glycol (MIRALAX / GLYCOLAX) packet 17 g (has no administration in time range)  famotidine (PEPCID) tablet 20 mg (has no administration in time range)  acetaminophen (TYLENOL) tablet 650 mg (has no administration in time range)  aspirin chewable tablet 81 mg (has no administration in time range)  insulin aspart (novoLOG) injection 0-9 Units (has no administration in time range)  norepinephrine (LEVOPHED) 4-5 MG/250ML-% infusion SOLN (5 mcg/min  New Bag/Given 10/09/2022 1337)  amiodarone (NEXTERONE) 150-4.21 MG/100ML-% bolus (33.3 mg  New Bag/Given 10/13/2022 1400)  amiodarone (NEXTERONE PREMIX) 360-4.14 MG/200ML-% (1.8 mg/mL) IV infusion (  Other (enter comment in med admin window) 10/03/2022 1405)  sodium bicarbonate injection 100 mEq (100 mEq Intravenous Given 09/24/2022 1447)   IMPRESSION / MDM / ASSESSMENT AND  PLAN / ED COURSE  I reviewed the triage vital signs and the nursing notes.                             The patient is on the cardiac monitor to evaluate for evidence of arrhythmia and/or significant heart rate changes. Patient's presentation is most consistent with acute presentation with potential threat to life or bodily function.  This patient presents to the ED for concern of V-fib arrest, this involves an extensive number of treatment options, and is a complaint that carries with it a high risk of complications and morbidity.  The differential diagnosis includes ACS, pacemaker failure, PE,  aortic dissection, tamponade Co morbidities that complicate the patient evaluation  Four-vessel CABG, pacemaker/defibrillator in place, hypertension, hyperlipidemia Additional history obtained:  Additional history obtained from EMS  External records from outside source obtained and reviewed including most recent cardiology visit on 09/23/2022 Lab Tests:  I Ordered, and personally interpreted labs.  The pertinent results include: Troponin 76, potassium 5.7, glucose 291, creatinine 3.37, AST/ALT 167/210, hemoglobin/hematocrit 10.6/31.8, INR 1.3 Imaging Studies ordered:  I ordered imaging studies including chest x-ray   I independently visualized and interpreted imaging which showed ET tube and OG tube in adequate positioning  I agree with the radiologist interpretation Cardiac Monitoring: / EKG:  The patient was maintained on a cardiac monitor.  I personally viewed and interpreted the cardiac monitored which showed an underlying rhythm of: *** Consultations Obtained:  I requested consultation with the ***,  and discussed lab and imaging findings as well as pertinent plan - they recommend: *** Problem List / ED Course / Critical interventions / Medication management  ***  I ordered medication including ***  for ***   Reevaluation of the patient after these medicines showed that the patient  {resolved/improved/worsened:23923::"improved"}  I have reviewed the patients home medicines and have made adjustments as needed Social Determinants of Health:  *** Test / Admission - Considered:  *** Dispo: ***       FINAL CLINICAL IMPRESSION(S) / ED DIAGNOSES   Final diagnoses:  Cardiac arrest ([place])   Rx / DC Orders   ED Discharge Orders     None      Note:  This document was prepared using Dragon voice recognition software and may include unintentional dictation errors.

## 2022-09-30 NOTE — ED Notes (Signed)
Artic sun applied to patient with settings 36-37C

## 2022-09-30 NOTE — Procedures (Signed)
Central Venous Catheter Insertion Procedure Note  Rick Zuniga  [id number]  10-04-1945  Date:10/01/2022  Time:10:26 PM   Provider Performing:Benford Asch L Rust-Chester   Procedure: Insertion of Non-tunneled Central Venous [phone]) with US guidance [id number])   Indication(s) Medication administration  Consent Risks of the procedure as well as the alternatives and risks of each were explained to the patient and/or caregiver.  Consent for the procedure was obtained and is signed in the bedside chart  Anesthesia Topical only with 1% lidocaine , versed IVP & fentanyl drip  Timeout Verified patient identification, verified procedure, site/side was marked, verified correct patient position, special equipment/implants available, medications/allergies/relevant history reviewed, required imaging and test results available.  Sterile Technique Maximal sterile technique including full sterile barrier drape, hand hygiene, sterile gown, sterile gloves, mask, hair covering, sterile ultrasound probe cover (if used).  Procedure Description Area of catheter insertion was cleaned with chlorhexidine and draped in sterile fashion.  With real-time ultrasound guidance a central venous catheter was placed into the left internal jugular vein. Nonpulsatile blood flow and easy flushing noted in all ports.  The catheter was sutured in place and sterile dressing applied.  Complications/Tolerance None; patient tolerated the procedure well. Chest X-ray is ordered to verify placement for internal jugular or subclavian cannulation.   Chest x-ray is not ordered for femoral cannulation.  EBL Minimal  Specimen(s) None  Left voicemail for daughter, Butch Penny, to give update and obtain consent. Spoke with son, Legrand Como, and obtained consent.   Domingo Pulse Rust-Chester, AGACNP-BC Acute Care Nurse Practitioner [facility]   [phone] / [phone] Please see Amion for pager details.

## 2022-09-30 NOTE — ED Notes (Signed)
1338 1g Calcium given

## 2022-09-30 NOTE — Progress Notes (Signed)
1600-Pt arrival. Pt placed on monitor and CHG bath given. Dr Lanney Gins to bedside - verbal orders to remove cooling protocol and continue with normothermic protocol. Family in waiting room and Dr Lanney Gins to give update. Full assessment complete, see flow sheets.  1626- D. Meda Coffee, NP at bedside. Pt now making tongue thrusting motion. Ativan and EEG to be ordered.   1700-Very slight cough/gag present. Movement to pain in BUE only. Rodman Comp, NP given update. Otherwise neurological examination unchanged.   1800-Pt now having spontaneous, non-purposeful movement in RUE only. Family at bedside.   1845- Pt having frequent movements with his right upper extremity - D. Meda Coffee, NP updates - Fentanyl to be restarted. Family at bedside.

## 2022-09-30 NOTE — ED Notes (Signed)
Ice packs applied to bilateral underarms and groin

## 2022-09-30 NOTE — H&P (Cosign Needed Addendum)
NAME:  Rick Zuniga, MRN:  [id number], DOB:  03/28/1946, LOS: 0 ADMISSION DATE:  09/19/2022, CONSULTATION DATE: 09/25/2022 REFERRING MD: Dr. Cheri Fowler, CHIEF COMPLAINT: Cardiac Arrest    History of Present Illness:  This is a 77 yo male who presented to [facility] ER on 02/13 post witnessed cardiac arrest.  According to pts significant other the pt c/o bilateral shoulder and upper back pain radiating to the left leg on 02/12.  On 02/13 the pt stated he felt better and wanted to go to [place] to eat.  When they arrived at the restaurant he had a witnessed cardiac arrest, bystander CPR initiated.  Estimated downtime prior to EMS arrival was 8 minutes.  EMS arrived on the scene pt in v-fib requiring defibrillation x1.  He subsequently went into PEA requiring 3 rounds of CPR prior to ROSC.    ER Course Upon arrival to the ER pt unresponsive.  He PEA arrested again, ACLS protocol initiated with ROSC achieved 3 minutes following initiation.  Pt mechanically intubated post cardiac arrest.  Levophed gtt started for hypotension.  Pt also received amiodarone bolus followed by amiodarone gtt.  Cardiology evaluated pt and agreed with medical management.  ABG revealed severe metabolic acidosis: pH 123456 37/pO2 351/acid-base deficit 17.8/bicarb 11.2.  PCCM team contacted for ICU admission.  Medications received: 1g calcium/75 mg rocuronium/fentanyl gtt @50$  mcg/hr/2 amps of sodium bicarb/amiodarone bolus followed by amiodarone gtt  Initial EKG: Ventricular paced, heart rate 116, no evidence of acute ischemia  Significant lab results: K+ 5.7/chloride 113/CO2 13/glucose 291/BUN 94/creatinine 3.37/calcium 8.0/AST 167/ALT 210/hgb 10.6/platelets 87/PT 16.4/INR 1.3.  COVID-19/Influenza A&B negative  CXR: ETT tip is 5 cm from the carina. OG tube partially visualized coursing below the diaphragm. Mild heterogeneous opacities, likely due to pulmonary edema.  Small left pleural effusion. KUB: OG tube tip is in the  fundus of the stomach. Nonobstructive bowel gas pattern.  CT Head/Cervical Spine: No acute intracranial abnormality. No acute fracture or traumatic listhesis of the cervical spine. Interlobular septal thickening in the lung apices, possibly due to pulmonary edema. CT Lumbar Spine: No traumatic injury to the lumbar spine. Mild multilevel degenerative changes.  Pertinent  Medical History  AICD Permanent Cardiac Pacemaker  Arthritis  Cataract  CHF  COPD COVID-19 (05/23/2022) Uncontrolled Type II Diabetes Mellitus  HTN  Hypertensive Retinopathy  Indigestion Thrombocytopenia  Recurrent Pleural Effusion DVT Aortic Atherosclerosis  CAD  HLD  Chronic Systolic Diastolic CHF : Echo [id number] <20% CABG x4 Stage IV CKD   Significant Hospital Events: Including procedures, antibiotic start and stop dates in addition to other pertinent events   02/13: Pt admitted post cardiac arrest mechanically intubated artic sun initiated    Interim History / Subjective:  Pt unresponsive mechanically intubated requiring levophed gtt @ 63mg/min and amiodarone gtt infusing at 60 mg/hr   Objective   Blood pressure 130/82, pulse 99, temperature (!) 94.3 F (34.6 C), temperature source Bladder, resp. rate 10, weight 75.7 kg, SpO2 100 %.    Vent Mode: AC FiO2 (%):  [100 %] 100 % Set Rate:  [18 bmp] 18 bmp Vt Set:  [470 mL] 470 mL PEEP:  [5 cmH20] 5 cmH20  No intake or output data in the 24 hours ending 10/01/2022 1442 Filed Weights   09/22/2022 1337  Weight: 75.7 kg   Examination: General: Acute on chronically-ill appearing male, NAD mechanically intubated  HENT: Supple, no JVD  Lungs: Diminished throughout, even, non labored  Cardiovascular: Ventricular-paced, no m/r/g, 2+ radial/1+ distal pulses, no  edema  Abdomen: +BS x4, soft, non distended Extremities: Normal bulk and tone Neuro: Sedated, not following commands or withdrawing from painful stimulation, right pupil unequal and non reactive/left  pupil 2 mm reactive  GU: Indwelling foley catheter draining clear yellow urine   Resolved Hospital Problem list    Assessment & Plan:  Mechanical intubation post cardiac arrest  Hx: COPD and chronic pleural effusion - Full vent support for now: vent settings reviewed and established - Continue lung protective strategies  - SBT once all parameters met  - Prn bronchodilator therapy  - Continue normothermia protocol - Maintain RASS goal of -4 during normothermia protocol  - PAD protocol to maintain RASS goal: Fentanyl and propofol gtts  Cardiac arrest (cardiac rhythm: PEA and v-fib)  Cardiogenic shock  Acute 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  Elevated troponin post cardiac arrest  Hx: AICD, permanent pacemaker, CAD s/p CABG, and HTN   Echo 08/10/22: EF <20% with Grade III diastolic dysfunction (restrictive) and right ventricular systolic function mildly reduced  - Continuous telemetry monitoring  - Cautious iv fluid resuscitation and prn levophed gtt to maintain map >65 - Hold outpatient diuretics, beta-blockers, and anti-hypertensive medications for now   - Trend troponin's until peaked  - Cardiology consulted appreciate input: continue amiodarone gtt   Acute kidney injury on stage IV CKD with hyperkalemia and severe metabolic acidosis secondary to ATN and concerning for cardiorenal syndrome  - Trend BMP  - Replace electrolytes as indicated  - Monitor UOP  - Nephrology consulted appreciate input   Anemia of of chronic kidney disease  Chronic thrombocytopenia  - Trend CBC  - Monitor for s/sx of bleeding  - Transfuse for hgb <7 and/or platelet count of <50,000 - Monitor for s/sx of bleeding   Shock liver  Chronic thrombocytopenia  - Trend hepatic function panel  - Avoid hepatotoxic medications   Type II diabetes mellitus  - Hemoglobin A1c pending  - CBG's q4hrs - SSI   Acute metabolic  encephalopathy post cardiac arrest concerning for possible anoxic injury  - Avoid sedating medications when able  - WUA once normothermia protocol complete - If mentation does not improve in the next 72hrs post normothermia protocol will order MRI Brain and neurology consult   Best Practice (right click and "Reselect all SmartList Selections" daily)   Diet/type: NPO DVT prophylaxis: prophylactic heparin  GI prophylaxis: H2B Lines: N/A Foley:  Yes, and it is still needed Code Status:  full code Last date of multidisciplinary goals of care discussion [N/A]  Will discuss current plan of care, pt prognosis, and code status once daughter and son are at bedside.  Will consult palliative care to assist with goals of care discussions.   Labs   CBC: No results for input(s): "WBC", "NEUTROABS", "HGB", "HCT", "MCV", "PLT" in the last 168 hours.  Basic Metabolic Panel: No results for input(s): "NA", "K", "CL", "CO2", "GLUCOSE", "BUN", "CREATININE", "CALCIUM", "MG", "PHOS" in the last 168 hours. GFR: CrCl cannot be calculated (Patient's most recent lab result is older than the maximum 21 days allowed.). No results for input(s): "PROCALCITON", "WBC", "LATICACIDVEN" in the last 168 hours.  Liver Function Tests: No results for input(s): "AST", "ALT", "ALKPHOS", "BILITOT", "PROT", "ALBUMIN" in the last 168 hours. No results for input(s): "LIPASE", "AMYLASE" in the last 168 hours. No results for input(s): "AMMONIA" in the last 168 hours.  ABG    Component Value Date/Time   PHART 7.09 (LL)  10/07/2022 1426   PCO2ART 37 10/08/2022 1426   PO2ART 351 (H) 09/20/2022 1426   HCO3 11.2 (L) 10/09/2022 1426   ACIDBASEDEF 17.8 (H) 10/01/2022 1426   O2SAT 100 09/25/2022 1426     Coagulation Profile: No results for input(s): "INR", "PROTIME" in the last 168 hours.  Cardiac Enzymes: No results for input(s): "CKTOTAL", "CKMB", "CKMBINDEX", "TROPONINI" in the last 168 hours.  HbA1C: Hgb A1c MFr Bld   Date/Time Value Ref Range Status  08/10/2022 05:44 AM 6.4 (H) 4.8 - 5.6 % Final    Comment:    (NOTE)         Prediabetes: 5.7 - 6.4         Diabetes: >6.4         Glycemic control for adults with diabetes: <7.0   06/08/2020 11:13 AM 10.9 (H) 4.8 - 5.6 % Final    Comment:    (NOTE)         Prediabetes: 5.7 - 6.4         Diabetes: >6.4         Glycemic control for adults with diabetes: <7.0     CBG: No results for input(s): "GLUCAP" in the last 168 hours.  Review of Systems:   Unable to assess pt mechanically intubated   Past Medical History:  He,  has a past medical history of AICD (automatic cardioverter/defibrillator) present, Arthritis, Cataract, CHF (congestive heart failure) ([place]), Chronic kidney disease, Complication of anesthesia, COPD (chronic obstructive pulmonary disease) ([place]), COVID-19 (05/23/2022), Diabetes ([place]), Diabetic retinopathy ([place]), Heart disease, Hypertension, Hypertensive retinopathy, Indigestion, Presence of permanent cardiac pacemaker, Recurrent pleural effusion on left, Thrombocytopenia ([place]), and Wears dentures.   Surgical History:   Past Surgical History:  Procedure Laterality Date   ANTERIOR VITRECTOMY Right 07/02/2022   Procedure: ANTERIOR VITRECTOMY;  Surgeon: Leandrew Koyanagi, MD;  Location: [facility];  Service: Ophthalmology;  Laterality: Right;   APPENDECTOMY     BI-VENTRICULAR IMPLANTABLE CARDIOVERTER DEFIBRILLATOR  (CRT-D)  05/20/2022   CARDIAC CATHETERIZATION Right 04/16/2022   CARDIAC SURGERY  12/21/2009   bypass. 4 vessel   CATARACT EXTRACTION W/PHACO Left 08/07/2021   Procedure: CATARACT EXTRACTION PHACO AND INTRAOCULAR LENS PLACEMENT (IOC) LEFT DIABETIC 9.06 01:42.2;  Surgeon: Leandrew Koyanagi, MD;  Location: [facility];  Service: Ophthalmology;  Laterality: Left;  Diabetic   CATARACT EXTRACTION W/PHACO Right 07/02/2022   Procedure: CATARACT EXTRACTION PHACO AND INTRAOCULAR LENS PLACEMENT (IOC) RIGHT VISION  BLUE HEALON 5 DIABETIC  10.62  01:16.7;  Surgeon: Leandrew Koyanagi, MD;  Location: [facility];  Service: Ophthalmology;  Laterality: Right;   CATARACT EXTRACTION W/PHACO Right 07/16/2022   Procedure: REMOVAL OF LENS FRAMENTS RIGHT DIABETIC;  Surgeon: Leandrew Koyanagi, MD;  Location: [facility];  Service: Ophthalmology;  Laterality: Right;  patient wants last   CHOLECYSTECTOMY     COLONOSCOPY  12/23/2010   IR THORACENTESIS ASP PLEURAL SPACE W/IMG GUIDE  07/18/2022   THORACENTESIS     02/25/21, 10/17/21     Social History:   reports that he has never smoked. He has never used smokeless tobacco. He reports that he does not drink alcohol and does not use drugs.   Family History:  His family history includes CAD in his mother; Diabetes in his father; Kidney failure in his father.   Allergies Allergies  Allergen Reactions   Pioglitazone Swelling   Furosemide Other (See Comments)    Pt may have had swelling in his face per spouse.  Drug discontinued  Metformin And Related Diarrhea   Prednisone     Other Reaction(s): Other (See Comments)  Face swells up and severe bleeding   Atorvastatin Rash   Benazepril Rash   Latex Rash    (tape only)   Other Rash    Pt reported that he is allergic to Ace wrap.  Reaction include severe skin irritation.   Tape Rash     Home Medications  Prior to Admission medications   Medication Sig Start Date End Date Taking? Authorizing Provider  acetaminophen (TYLENOL) 325 MG tablet Take 325 mg by mouth every 4 (four) hours as needed.    [provider]  aspirin EC 81 MG tablet Take 81 mg by mouth daily.    [provider]  Continuous Blood Gluc Receiver (FREESTYLE LIBRE 14 DAY READER) DEVI Use 1 Device as directed Dx: E11.29 02/22/20   [provider]  Continuous Blood Gluc Sensor (FREESTYLE LIBRE 14 DAY SENSOR) MISC SMARTSIG:1 Kit(s) Topical Every 2 Weeks 10/29/20   [provider]  glipiZIDE  (GLUCOTROL) 5 MG tablet Take 5 mg by mouth daily before breakfast. 07/28/22   [provider]  insulin degludec (TRESIBA) 100 UNIT/ML FlexTouch Pen Inject 10 Units into the skin daily.    [provider]  meclizine (ANTIVERT) 25 MG tablet Take 1 tablet (25 mg total) by mouth 3 (three) times daily as needed for dizziness. Patient not taking: Reported on 09/18/2022 03/02/20   Sharen Hones, MD  metoprolol succinate (TOPROL-XL) 25 MG 24 hr tablet Take 0.5 tablets (12.5 mg total) by mouth daily. 08/28/22   Alisa Graff, FNP  nitroGLYCERIN (NITROSTAT) 0.4 MG SL tablet Place 0.4 mg under the tongue every 5 (five) minutes as needed for chest pain. Patient not taking: Reported on 08/28/2022    [provider]  potassium chloride (KLOR-CON) 10 MEQ tablet Take 1 tablet (10 mEq total) by mouth 2 (two) times daily. 08/28/22   Alisa Graff, FNP  sacubitril-valsartan (ENTRESTO) 24-26 MG Take 1 tablet by mouth 2 (two) times daily. 08/28/22   Alisa Graff, FNP  senna-docusate (SENOKOT-S) 8.6-50 MG tablet Take 1 tablet by mouth 2 (two) times daily. 08/13/22   Raiford Noble Latif, DO  spironolactone (ALDACTONE) 25 MG tablet Take 1 tablet (25 mg total) by mouth daily. 08/28/22   Alisa Graff, FNP  tamsulosin (FLOMAX) 0.4 MG CAPS capsule Take 0.4 mg by mouth daily.    [provider]  torsemide (DEMADEX) 20 MG tablet Take by mouth daily. 20 mg in the AM and 20 mg in the PM as needed    [provider]     Critical care time: 60 minutes      Donell Beers, [place] Pager [phone] (please enter 7 digits) PCCM Consult Pager [phone] (please enter 7 digits)

## 2022-09-30 NOTE — ED Notes (Signed)
Wife and sister in law at bedside, MD aware.

## 2022-09-30 NOTE — Progress Notes (Addendum)
Brief Cardiology Consult  Patient presented after a witnessed sudden death episode while at [employer]. Patient intubated sedated on amiodarone IV. AICD interrogation suggests 6 shocks. The first was successful but he failed to covert after 6 additional shocks. EKG doesn't suggest a stemi.  Impression Vib arrest/Sudden death  ICM EF=20%  Multivessel CAD Chronic Systolic Heart Failure  AICD CRT-D Hx C ABG Resp Arrest . Plan Agree with critical care management  Continue vent support  Maintain IV amiodarone  Anticoagulation with heparin for now Consider neurological evaluation for possible anoxic brain injury No invasive procedures plan at this stage   Full note to follow   CARDIOLOGY CONSULT NOTE               Patient ID: Rick Zuniga MRN: [id number] DOB/AGE: 01/27/1946 77 y.o.  Admit date: 10/10/2022 Referring Physician Dr. Jerl Santos critical care Primary Physician Harrel Lemon primary Primary Cardiologist Nehemiah Massed Reason for Consultation V-fib arrest respiratory arrest end-stage ischemic cardiomyopathy  HPI: 77 year old white male presented to the [facility] ER after witnessed arrest at a restaurant patient has known end-stage ischemic cardiomyopathy EF less than 25% AICD in place patient is getting CRT-D therapy chronic systolic congestive heart failure generalized weakness reportedly had witnessed arrest AICD fired in place but patient went back into [facility] arrived and the patient was then defibrillated and ROSC was restored and patient was brought to the emergency room and intubated sedated and placed on pressors amiodarone and antiarrhythmics and transported to ICU for further management cardiology was consulted for further assistance in overall cardiac care  Review of systems complete and found to be negative unless listed above     Past Medical History:  Diagnosis Date   AICD (automatic cardioverter/defibrillator) present    Arthritis    Cataract     Mixed OU   CHF (congestive heart failure) ([place])    Chronic kidney disease    Complication of anesthesia    Difficulty inserting IV, wants expert at IV's, no multiple attempts   COPD (chronic obstructive pulmonary disease) ([place])    COVID-19 05/23/2022   Diabetes ([place])    Type 2   Diabetic retinopathy ([place])    NPDR OU   Heart disease    Hypertension    Hypertensive retinopathy    OU   Indigestion    Presence of permanent cardiac pacemaker    Recurrent pleural effusion on left    Thrombocytopenia ([place])    Wears dentures    full upper and lower    Past Surgical History:  Procedure Laterality Date   ANTERIOR VITRECTOMY Right 07/02/2022   Procedure: ANTERIOR VITRECTOMY;  Surgeon: Leandrew Koyanagi, MD;  Location: [facility];  Service: Ophthalmology;  Laterality: Right;   APPENDECTOMY     BI-VENTRICULAR IMPLANTABLE CARDIOVERTER DEFIBRILLATOR  (CRT-D)  05/20/2022   CARDIAC CATHETERIZATION Right 04/16/2022   CARDIAC SURGERY  12/21/2009   bypass. 4 vessel   CATARACT EXTRACTION W/PHACO Left 08/07/2021   Procedure: CATARACT EXTRACTION PHACO AND INTRAOCULAR LENS PLACEMENT (IOC) LEFT DIABETIC 9.06 01:42.2;  Surgeon: Leandrew Koyanagi, MD;  Location: [facility];  Service: Ophthalmology;  Laterality: Left;  Diabetic   CATARACT EXTRACTION W/PHACO Right 07/02/2022   Procedure: CATARACT EXTRACTION PHACO AND INTRAOCULAR LENS PLACEMENT (IOC) RIGHT VISION BLUE HEALON 5 DIABETIC  10.62  01:16.7;  Surgeon: Leandrew Koyanagi, MD;  Location: [facility];  Service: Ophthalmology;  Laterality: Right;   CATARACT EXTRACTION W/PHACO Right 07/16/2022   Procedure: REMOVAL OF LENS FRAMENTS RIGHT  DIABETIC;  Surgeon: Leandrew Koyanagi, MD;  Location: [facility];  Service: Ophthalmology;  Laterality: Right;  patient wants last   CHOLECYSTECTOMY     COLONOSCOPY  12/23/2010   IR THORACENTESIS ASP PLEURAL SPACE W/IMG GUIDE  07/18/2022   THORACENTESIS     02/25/21, 10/17/21     No medications prior to admission.   Social History   Socioeconomic History   Marital status: Single    Spouse name: Not on file   Number of children: Not on file   Years of education: Not on file   Highest education level: Not on file  Occupational History   Not on file  Tobacco Use   Smoking status: Never   Smokeless tobacco: Never  Vaping Use   Vaping Use: Never used  Substance and Sexual Activity   Alcohol use: No   Drug use: No   Sexual activity: Not on file  Other Topics Concern   Not on file  Social History Narrative   Not on file   Social Determinants of Health   Financial Resource Strain: Not on file  Food Insecurity: No Food Insecurity (08/10/2022)   Hunger Vital Sign    Worried About Running Out of Food in the Last Year: Never true    Ran Out of Food in the Last Year: Never true  Transportation Needs: No Transportation Needs (08/10/2022)   PRAPARE - Hydrologist (Medical): No    Lack of Transportation (Non-Medical): No  Physical Activity: Not on file  Stress: Not on file  Social Connections: Not on file  Intimate Partner Violence: Not At Risk (08/10/2022)   Humiliation, Afraid, Rape, and Kick questionnaire    Fear of Current or Ex-Partner: No    Emotionally Abused: No    Physically Abused: No    Sexually Abused: No    Family History  Problem Relation Age of Onset   CAD Mother    Kidney failure Father    Diabetes Father       Review of systems complete and found to be negative unless listed above      PHYSICAL EXAM  General: Well developed, well nourished, in no acute distress critically ill-appearing intubated sedated HEENT:  Normocephalic and atramatic Neck:  No JVD.  Lungs: Clear bilaterally to auscultation and percussion. Heart: HRRR . Normal S1 and S2 without gallops or murmurs.  Abdomen: Bowel sounds are positive, abdomen soft and non-tender  Msk:  Back normal, normal gait. Normal strength and tone  for age. Extremities: No clubbing, cyanosis or edema.   Neuro: Intubated sedated unresponsive. Psych: Unresponsive intubated sedated  Labs:   Lab Results  Component Value Date   WBC 3.9 (L) 10/25/22   HGB 9.0 (L) 2022-10-25   HCT 26.7 (L) October 25, 2022   MCV 99.6 25-Oct-2022   PLT 68 (L) October 25, 2022   No results for input(s): "NA", "K", "CL", "CO2", "BUN", "CREATININE", "CALCIUM", "PROT", "BILITOT", "ALKPHOS", "ALT", "AST", "GLUCOSE" in the last 168 hours.  Invalid input(s): "LABALBU" No results found for: "CKTOTAL", "CKMB", "CKMBINDEX", "TROPONINI"  Lab Results  Component Value Date   CHOL 149 05/26/2019   CHOL 156 10/28/2017   Lab Results  Component Value Date   HDL 38 (L) 05/26/2019   HDL 60 10/28/2017   Lab Results  Component Value Date   LDLCALC 55 05/26/2019   [place] 88 10/28/2017   Lab Results  Component Value Date   TRIG 282 (H) 05/26/2019   TRIG 39 10/28/2017  Lab Results  Component Value Date   CHOLHDL 3.9 05/26/2019   CHOLHDL 2.6 10/28/2017   No results found for: "LDLDIRECT"    Radiology: EEG adult  Result Date: 10/01/2022 Derek Jack, MD     October 30, 2022  2:13 PM Routine EEG Report Rick Zuniga is a 77 y.o. male with a history of cardiac arrest who is undergoing an EEG to evaluate for seizures. Report: This EEG was acquired with electrodes placed according to the International 10-20 electrode system (including Fp1, Fp2, F3, F4, C3, C4, P3, P4, O1, O2, T3, T4, T5, T6, A1, A2, Fz, Cz, Pz). The following electrodes were missing or displaced: none. The best background was 3-5 Hz. This activity was continuous and reactive to stimulation. There was no clear waking background or sleep architecture. There was no focal slowing.There were frequent triphasic waves that at times became periodic up to 1 Hz for up to 10 seconds. There were no definitive interictal epileptiform discharges. Photic stimulation and hyperventilation were not performed. Impression and  clinical correlation: This EEG was obtained while comatose and is abnormal due to: - Moderate to severe slowing diffuse slowing - Triphasic waves at times periodic up to 1 Hz that did no appear particularly epileptiform. This pattern is typically associated with metabolic encephalopathy and would be considered to be on the ictal-interictal continuum with low change of evolution to seizure. - No definitive epileptiform discharges were seen - If patient's mental status does not improve off sedation consider repeat EEG in 1-2 days Su Monks, MD [facility] [phone] If 7pm- 7am, please page neurology on call as listed in [place].   DG Chest Port 1 View  Result Date: 10/01/2022 CLINICAL DATA:  Pulmonary edema EXAM: PORTABLE CHEST 1 VIEW COMPARISON:  10/14/2022 FINDINGS: Stable ET tube, enteric tube, left IJ catheter. Left upper chest defibrillator with leads along the right side of the heart. Presumed contrast along the stomach at the edge of the imaging field. Enlarged heart with small left effusion and adjacent opacities. The effusion is decreasing. Persistent right lung base opacity. Vascular congestion. No pneumothorax. Overlapping cardiac leads. IMPRESSION: 1. Slight decrease in left effusion and opacity. Significant residual Electronically Signed   By: Jill Side M.D.   On: 10/01/2022 11:11   DG Chest 1 View  Result Date: 09/28/2022 CLINICAL DATA:  Central venous catheter placement EXAM: CHEST  1 VIEW COMPARISON:  10/11/2022 FINDINGS: Endotracheal tube is seen 3.8 cm above the carina. Nasogastric tube extends into the upper abdomen beyond the margin of the examination. Left internal jugular central venous catheter is been placed with its tip overlying the expected superior vena cava. The lungs are symmetrically well expanded. There is progressive interstitial pulmonary infiltrate most in keeping with pulmonary edema, asymmetrically more severe within the right perihilar region. No  pneumothorax or pleural effusion. Coronary artery bypass grafting has been performed. Cardiac size is mildly enlarged. Left subclavian pacemaker defibrillator is unchanged. IMPRESSION: 1. Support tubes in appropriate position. Left internal jugular central venous catheter in appropriate position. No pneumothorax. 2. Progressive pulmonary edema. Electronically Signed   By: Fidela Salisbury M.D.   On: 09/24/2022 22:28   CT HEAD WO CONTRAST (5MM)  Result Date: 10/04/2022 CLINICAL DATA:  Mental status change, unknown cause. EXAM: CT HEAD WITHOUT CONTRAST CT CERVICAL SPINE WITHOUT CONTRAST TECHNIQUE: Multidetector CT imaging of the head and cervical spine was performed following the standard protocol without intravenous contrast. Multiplanar CT image reconstructions of the cervical spine were also generated. RADIATION DOSE  REDUCTION: This exam was performed according to the departmental dose-optimization program which includes automated exposure control, adjustment of the mA and/or kV according to patient size and/or use of iterative reconstruction technique. COMPARISON:  None Available. FINDINGS: CT HEAD FINDINGS Brain: No acute hemorrhage, mass effect or midline shift. Gray-white differentiation is preserved. No hydrocephalus. No extra-axial collection. Basilar cisterns are patent. Vascular: Extensive intracranial atherosclerosis. Skull: No calvarial fracture or suspicious bone lesion. Skull base is unremarkable. Sinuses/Orbits: Layering fluid in the sinuses and nasopharynx, likely secondary to endotracheal intubation. Other: None. CT CERVICAL SPINE FINDINGS Alignment: Normal. Skull base and vertebrae: Diffusely demineralized appearance of the bones. No acute fracture. Normal craniocervical junction. Mild degenerative changes of the C1-2 articulation. Soft tissues and spinal canal: No prevertebral fluid or swelling. No visible canal hematoma. Disc levels: Mild cervical spondylosis without high-grade spinal canal  stenosis. Upper chest: Interlobular septal thickening, possibly due to pulmonary edema. Other: Atherosclerotic calcifications of the carotid bulbs. IMPRESSION: CT HEAD: No acute intracranial abnormality. CT CERVICAL SPINE: 1. No acute fracture or traumatic listhesis of the cervical spine. 2. Interlobular septal thickening in the lung apices, possibly due to pulmonary edema. Electronically Signed   By: Emmit Alexanders M.D.   On: 09/23/2022 15:33   CT CERVICAL SPINE WO CONTRAST  Result Date: 10/10/2022 CLINICAL DATA:  Mental status change, unknown cause. EXAM: CT HEAD WITHOUT CONTRAST CT CERVICAL SPINE WITHOUT CONTRAST TECHNIQUE: Multidetector CT imaging of the head and cervical spine was performed following the standard protocol without intravenous contrast. Multiplanar CT image reconstructions of the cervical spine were also generated. RADIATION DOSE REDUCTION: This exam was performed according to the departmental dose-optimization program which includes automated exposure control, adjustment of the mA and/or kV according to patient size and/or use of iterative reconstruction technique. COMPARISON:  None Available. FINDINGS: CT HEAD FINDINGS Brain: No acute hemorrhage, mass effect or midline shift. Gray-white differentiation is preserved. No hydrocephalus. No extra-axial collection. Basilar cisterns are patent. Vascular: Extensive intracranial atherosclerosis. Skull: No calvarial fracture or suspicious bone lesion. Skull base is unremarkable. Sinuses/Orbits: Layering fluid in the sinuses and nasopharynx, likely secondary to endotracheal intubation. Other: None. CT CERVICAL SPINE FINDINGS Alignment: Normal. Skull base and vertebrae: Diffusely demineralized appearance of the bones. No acute fracture. Normal craniocervical junction. Mild degenerative changes of the C1-2 articulation. Soft tissues and spinal canal: No prevertebral fluid or swelling. No visible canal hematoma. Disc levels: Mild cervical spondylosis  without high-grade spinal canal stenosis. Upper chest: Interlobular septal thickening, possibly due to pulmonary edema. Other: Atherosclerotic calcifications of the carotid bulbs. IMPRESSION: CT HEAD: No acute intracranial abnormality. CT CERVICAL SPINE: 1. No acute fracture or traumatic listhesis of the cervical spine. 2. Interlobular septal thickening in the lung apices, possibly due to pulmonary edema. Electronically Signed   By: Emmit Alexanders M.D.   On: 09/20/2022 15:33   CT LUMBAR SPINE WO CONTRAST  Result Date: 10/05/2022 CLINICAL DATA:  Witnessed arrest, back pain EXAM: CT LUMBAR SPINE WITHOUT CONTRAST TECHNIQUE: Multidetector CT imaging of the lumbar spine was performed without intravenous contrast administration. Multiplanar CT image reconstructions were also generated. RADIATION DOSE REDUCTION: This exam was performed according to the departmental dose-optimization program which includes automated exposure control, adjustment of the mA and/or kV according to patient size and/or use of iterative reconstruction technique. COMPARISON:  None Available. FINDINGS: Segmentation: Lumbar type vertebral bodies. Alignment: Normal lumbar lordosis. Vertebrae: No acute fracture or focal pathologic process. Paraspinal and other soft tissues: Cardiac ICD leads, incompletely visualized. Small loculated left pleural  effusion, decreased from prior CT chest. Prior cholecystectomy. Extensive vascular calcifications. Disc levels: Mild multilevel degenerative changes, most prominent at L4-5 and L5-S1. Spinal canal is patent. IMPRESSION: No traumatic injury to the lumbar spine. Mild multilevel degenerative changes. Electronically Signed   By: Julian Hy M.D.   On: 10/05/2022 15:30   DG Chest 1 View  Result Date: 10/11/2022 CLINICAL DATA:  ETT and OG tube placement EXAM: CHEST  1 VIEW COMPARISON:  Chest x-ray dated August 08, 2022 FINDINGS: Cardiac and mediastinal contours unchanged status post median  sternotomy. Left chest wall pacer unchanged lead position. ETT tip is approximally 5 cm from the carina. OG tube partially visualized coursing below the diaphragm. Mild heterogeneous opacities, likely due to pulmonary edema. Small left pleural effusion. No evidence of pneumothorax IMPRESSION: 1. ETT tip is 5 cm from the carina. 2. OG tube partially visualized coursing below the diaphragm. 3. Mild heterogeneous opacities, likely due to pulmonary edema. 4. Small left pleural effusion. Electronically Signed   By: Yetta Glassman M.D.   On: 10/06/2022 14:47   DG Abdomen 1 View  Result Date: 10/07/2022 CLINICAL DATA:  OG tube placement EXAM: ABDOMEN - 1 VIEW COMPARISON:  02/11/2020. FINDINGS: OG tube tip is in the fundus of the stomach. Nonobstructive bowel gas pattern. IMPRESSION: OG tube in the stomach. Electronically Signed   By: Rolm Baptise M.D.   On: 09/24/2022 14:44    EKG: Paced rhythm ventricular wide-complex rate of 100  ASSESSMENT AND PLAN:  Ventricular fibrillation arrest End-stage ischemic cardiomyopathy Multivessel coronary disease Chronic severe systolic heart failure end-stage History of AICD CRT-D therapy History of coronary bypass surgery Respiratory arrest . Conclusion Agree with intensive care critical care management in ICU Maintain ventilatory support and wean when appropriate Agree with device interrogation which suggested 1 successful shock with 5 unsuccessful subsequent shocks until patient was defibrillated externally IV amiodarone postarrest IV load and drip Anticoagulated with heparin for now for ACS Recommend neurology consult for evaluation for anoxic brain injury Repeat echocardiogram may be helpful I do not recommend invasive procedures like a cardiac cath or EP study at this stage Hopefully we can wean the patient off percent and off of pressors if neurologically intact Discussion with the family about palliative care should also be  entertained  Signed: Yolonda Kida MD 10/10/2022, 8:06 AM

## 2022-10-01 ENCOUNTER — Inpatient Hospital Stay: Payer: Medicare Other

## 2022-10-01 DIAGNOSIS — R4182 Altered mental status, unspecified: Secondary | ICD-10-CM | POA: Diagnosis not present

## 2022-10-01 LAB — CBC WITH DIFFERENTIAL/PLATELET
Abs Immature Granulocytes: 0.02 10*3/uL (ref 0.00–0.07)
Basophils Absolute: 0 10*3/uL (ref 0.0–0.1)
Basophils Relative: 0 %
Eosinophils Absolute: 0 10*3/uL (ref 0.0–0.5)
Eosinophils Relative: 0 %
HCT: 28.7 % — ABNORMAL LOW (ref 39.0–52.0)
Hemoglobin: 10.1 g/dL — ABNORMAL LOW (ref 13.0–17.0)
Immature Granulocytes: 0 %
Lymphocytes Relative: 4 %
Lymphs Abs: 0.3 10*3/uL — ABNORMAL LOW (ref 0.7–4.0)
MCH: 33.3 pg (ref 26.0–34.0)
MCHC: 35.2 g/dL (ref 30.0–36.0)
MCV: 94.7 fL (ref 80.0–100.0)
Monocytes Absolute: 0.7 10*3/uL (ref 0.1–1.0)
Monocytes Relative: 9 %
Neutro Abs: 6.5 10*3/uL (ref 1.7–7.7)
Neutrophils Relative %: 87 %
Platelets: 86 10*3/uL — ABNORMAL LOW (ref 150–400)
RBC: 3.03 MIL/uL — ABNORMAL LOW (ref 4.22–5.81)
RDW: 12.1 % (ref 11.5–15.5)
WBC: 7.5 10*3/uL (ref 4.0–10.5)
nRBC: 0 % (ref 0.0–0.2)

## 2022-10-01 LAB — MAGNESIUM
Magnesium: 2.2 mg/dL (ref 1.7–2.4)
Magnesium: 1.9 mg/dL (ref 1.7–2.4)

## 2022-10-01 LAB — HEPATITIS B CORE ANTIBODY, IGM: Hep B C IgM: NONREACTIVE

## 2022-10-01 LAB — BLOOD GAS, VENOUS
Acid-base deficit: 2.6 mmol/L — ABNORMAL HIGH (ref 0.0–2.0)
Bicarbonate: 23.2 mmol/L (ref 20.0–28.0)
FIO2: 40 %
MECHVT: 550 mL
Mechanical Rate: 18
O2 Saturation: 65.4 %
PEEP: 5 cmH2O
Patient temperature: 37
pCO2, Ven: 43 mmHg — ABNORMAL LOW (ref 44–60)
pH, Ven: 7.34 (ref 7.25–7.43)
pO2, Ven: 39 mmHg (ref 32–45)

## 2022-10-01 LAB — BASIC METABOLIC PANEL
Anion gap: 8 (ref 5–15)
Anion gap: 7 (ref 5–15)
BUN: 89 mg/dL — ABNORMAL HIGH (ref 8–23)
BUN: 80 mg/dL — ABNORMAL HIGH (ref 8–23)
CO2: 21 mmol/L — ABNORMAL LOW (ref 22–32)
CO2: 22 mmol/L (ref 22–32)
Calcium: 8.4 mg/dL — ABNORMAL LOW (ref 8.9–10.3)
Calcium: 8 mg/dL — ABNORMAL LOW (ref 8.9–10.3)
Chloride: 114 mmol/L — ABNORMAL HIGH (ref 98–111)
Chloride: 111 mmol/L (ref 98–111)
Creatinine, Ser: 3 mg/dL — ABNORMAL HIGH (ref 0.61–1.24)
Creatinine, Ser: 2.73 mg/dL — ABNORMAL HIGH (ref 0.61–1.24)
GFR, Estimated: 21 mL/min — ABNORMAL LOW (ref >60)
GFR, Estimated: 23 mL/min — ABNORMAL LOW (ref >60)
Glucose, Bld: 40 mg/dL — CL (ref 70–99)
Glucose, Bld: 83 mg/dL (ref 70–99)
Potassium: 5.7 mmol/L — ABNORMAL HIGH (ref 3.5–5.1)
Potassium: 5.9 mmol/L — ABNORMAL HIGH (ref 3.5–5.1)
Sodium: 143 mmol/L (ref 135–145)
Sodium: 140 mmol/L (ref 135–145)

## 2022-10-01 LAB — HEPATIC FUNCTION PANEL
ALT: 286 U/L — ABNORMAL HIGH (ref 0–44)
AST: 195 U/L — ABNORMAL HIGH (ref 15–41)
Albumin: 3.4 g/dL — ABNORMAL LOW (ref 3.5–5.0)
Alkaline Phosphatase: 71 U/L (ref 38–126)
Bilirubin, Direct: 0.2 mg/dL (ref 0.0–0.2)
Indirect Bilirubin: 1 mg/dL — ABNORMAL HIGH (ref 0.3–0.9)
Total Bilirubin: 1.2 mg/dL (ref 0.3–1.2)
Total Protein: 6.2 g/dL — ABNORMAL LOW (ref 6.5–8.1)

## 2022-10-01 LAB — GLUCOSE, CAPILLARY
Glucose-Capillary: 121 mg/dL — ABNORMAL HIGH (ref 70–99)
Glucose-Capillary: 129 mg/dL — ABNORMAL HIGH (ref 70–99)
Glucose-Capillary: 135 mg/dL — ABNORMAL HIGH (ref 70–99)
Glucose-Capillary: 154 mg/dL — ABNORMAL HIGH (ref 70–99)
Glucose-Capillary: 302 mg/dL — ABNORMAL HIGH (ref 70–99)
Glucose-Capillary: 61 mg/dL — ABNORMAL LOW (ref 70–99)
Glucose-Capillary: 63 mg/dL — ABNORMAL LOW (ref 70–99)
Glucose-Capillary: 65 mg/dL — ABNORMAL LOW (ref 70–99)
Glucose-Capillary: 70 mg/dL (ref 70–99)
Glucose-Capillary: 71 mg/dL (ref 70–99)
Glucose-Capillary: 97 mg/dL (ref 70–99)

## 2022-10-01 LAB — TROPONIN I (HIGH SENSITIVITY)
Troponin I (High Sensitivity): 3551 ng/L (ref <18)
Troponin I (High Sensitivity): 3678 ng/L (ref <18)
Troponin I (High Sensitivity): 4236 ng/L (ref <18)
Troponin I (High Sensitivity): 4731 ng/L (ref <18)

## 2022-10-01 LAB — LACTIC ACID, PLASMA
Lactic Acid, Venous: 2 mmol/L (ref 0.5–1.9)
Lactic Acid, Venous: 3.1 mmol/L (ref 0.5–1.9)

## 2022-10-01 LAB — POTASSIUM
Potassium: 5.5 mmol/L — ABNORMAL HIGH (ref 3.5–5.1)
Potassium: 5.8 mmol/L — ABNORMAL HIGH (ref 3.5–5.1)

## 2022-10-01 LAB — PHOSPHORUS
Phosphorus: 3.8 mg/dL (ref 2.5–4.6)
Phosphorus: 3.7 mg/dL (ref 2.5–4.6)

## 2022-10-01 LAB — PROTIME-INR
INR: 1.4 — ABNORMAL HIGH (ref 0.8–1.2)
Prothrombin Time: 16.8 seconds — ABNORMAL HIGH (ref 11.4–15.2)

## 2022-10-01 LAB — HEPARIN LEVEL (UNFRACTIONATED)
Heparin Unfractionated: 0.18 IU/mL — ABNORMAL LOW (ref 0.30–0.70)
Heparin Unfractionated: 0.77 IU/mL — ABNORMAL HIGH (ref 0.30–0.70)
Heparin Unfractionated: 1.05 IU/mL — ABNORMAL HIGH (ref 0.30–0.70)

## 2022-10-01 LAB — HEPATITIS B CORE ANTIBODY, TOTAL: Hep B Core Total Ab: NONREACTIVE

## 2022-10-01 LAB — HEPATITIS B SURFACE ANTIGEN: Hepatitis B Surface Ag: NONREACTIVE

## 2022-10-01 LAB — PROCALCITONIN: Procalcitonin: 3.15 ng/mL

## 2022-10-01 MED ORDER — FREE WATER
30.0000 mL | Status: DC
  Administered 2022-10-01 – 2022-10-02 (×6): 30 mL

## 2022-10-01 MED ORDER — DEXTROSE 50 % IV SOLN
12.5000 g | INTRAVENOUS | Status: AC
  Administered 2022-10-01: 12.5 g via INTRAVENOUS
  Filled 2022-10-01: qty 50

## 2022-10-01 MED ORDER — VASOPRESSIN 20 UNITS/100 ML INFUSION FOR SHOCK
0.0000 [IU]/min | INTRAVENOUS | Status: DC
  Administered 2022-10-01: 0.03 [IU]/min via INTRAVENOUS
  Administered 2022-10-01 – 2022-10-02 (×2): 0.04 [IU]/min via INTRAVENOUS
  Filled 2022-10-01 (×4): qty 100

## 2022-10-01 MED ORDER — DEXTROSE 50 % IV SOLN
INTRAVENOUS | Status: AC
  Filled 2022-10-01: qty 50

## 2022-10-01 MED ORDER — DEXTROSE 50 % IV SOLN: 12.5000 g | INTRAVENOUS | Status: AC

## 2022-10-01 MED ORDER — INSULIN ASPART 100 UNIT/ML IV SOLN
10.0000 [IU] | Freq: Once | INTRAVENOUS | Status: AC
  Administered 2022-10-01: 10 [IU] via INTRAVENOUS
  Filled 2022-10-01: qty 0.1

## 2022-10-01 MED ORDER — CHLORHEXIDINE GLUCONATE CLOTH 2 % EX PADS
6.0000 | MEDICATED_PAD | Freq: Every day | CUTANEOUS | Status: DC
  Administered 2022-10-01: 6 via TOPICAL

## 2022-10-01 MED ORDER — SODIUM ZIRCONIUM CYCLOSILICATE 5 G PO PACK
10.0000 g | PACK | Freq: Three times a day (TID) | ORAL | Status: DC
  Administered 2022-10-01 – 2022-10-02 (×4): 10 g
  Filled 2022-10-01 (×4): qty 2

## 2022-10-01 MED ORDER — VITAL AF 1.2 CAL PO LIQD
1000.0000 mL | ORAL | Status: DC
  Administered 2022-10-01 – 2022-10-02 (×3): 1000 mL

## 2022-10-01 MED ORDER — HEPARIN (PORCINE) 25000 UT/250ML-% IV SOLN
1000.0000 [IU]/h | INTRAVENOUS | Status: DC
  Administered 2022-10-02: 1000 [IU]/h via INTRAVENOUS

## 2022-10-01 MED ORDER — FAMOTIDINE 20 MG PO TABS
20.0000 mg | ORAL_TABLET | Freq: Every day | ORAL | Status: DC
  Administered 2022-10-01 – 2022-10-02 (×2): 20 mg
  Filled 2022-10-01 (×2): qty 1

## 2022-10-01 MED ORDER — DEXTROSE 50 % IV SOLN
25.0000 g | Freq: Once | INTRAVENOUS | Status: AC
  Administered 2022-10-01: 25 g via INTRAVENOUS
  Filled 2022-10-01: qty 50

## 2022-10-01 MED ORDER — VITAL HIGH PROTEIN PO LIQD: 1000.0000 mL | ORAL | Status: DC

## 2022-10-01 MED ORDER — SODIUM BICARBONATE 8.4 % IV SOLN
INTRAVENOUS | Status: DC
  Filled 2022-10-01: qty 1000
  Filled 2022-10-01: qty 150

## 2022-10-01 MED ORDER — INSULIN ASPART 100 UNIT/ML IJ SOLN
0.0000 [IU] | INTRAMUSCULAR | Status: DC
  Administered 2022-10-01 (×2): 1 [IU] via SUBCUTANEOUS
  Administered 2022-10-02: 2 [IU] via SUBCUTANEOUS
  Administered 2022-10-02: 3 [IU] via SUBCUTANEOUS
  Administered 2022-10-02: 5 [IU] via SUBCUTANEOUS
  Administered 2022-10-02: 3 [IU] via SUBCUTANEOUS
  Filled 2022-10-01 (×7): qty 1

## 2022-10-01 MED ORDER — FUROSEMIDE 10 MG/ML IJ SOLN
40.0000 mg | Freq: Once | INTRAMUSCULAR | Status: AC
  Administered 2022-10-01: 40 mg via INTRAVENOUS
  Filled 2022-10-01: qty 4

## 2022-10-01 MED ORDER — SODIUM BICARBONATE 8.4 % IV SOLN
50.0000 meq | Freq: Once | INTRAVENOUS | Status: AC
  Administered 2022-10-01: 50 meq via INTRAVENOUS
  Filled 2022-10-01: qty 50

## 2022-10-01 MED ORDER — DEXTROSE 50 % IV SOLN
12.5000 g | Freq: Once | INTRAVENOUS | Status: AC
  Administered 2022-10-01: 12.5 g via INTRAVENOUS
  Filled 2022-10-01: qty 50

## 2022-10-01 MED ORDER — DEXTROSE 50 % IV SOLN
INTRAVENOUS | Status: AC
  Administered 2022-10-01: 12.5 g via INTRAVENOUS
  Filled 2022-10-01: qty 50

## 2022-10-01 NOTE — TOC Initial Note (Signed)
Transition of Care [facility]) - Initial/Assessment Note    Patient Details  Name: Rick Zuniga MRN: [id number] Date of Birth: 1945-08-27  [facility] CM/SW Contact:    Shelbie Hutching, RN Phone Number: 10/01/2022, 10:40 AM  Clinical Narrative:                 Patient admitted to the ICU after cardiac arrest.  Patient is currently intubated.  Patient was a witnessed cardiac arrest, he is from home where he lives alone.  Patient has a significant other.  TOC will follow.   Expected Discharge Plan:  (TBD) Barriers to Discharge: Continued Medical Work up   Patient Goals and CMS Choice Patient states their goals for this hospitalization and ongoing recovery are:: patient not able to state- intubated in [place]          Expected Discharge Plan and Services       Living arrangements for the past 2 months: Single Family Home                 DME Arranged: N/A                    Prior Living Arrangements/Services Living arrangements for the past 2 months: Single Family Home Lives with:: Self Patient language and need for interpreter reviewed:: Yes        Need for Family Participation in Patient Care: Yes (Comment) Care giver support system in place?: Yes (comment) Current home services: DME (walker) Criminal Activity/Legal Involvement Pertinent to Current Situation/Hospitalization: No - Comment as needed  Activities of Daily Living      Permission Sought/Granted                  Emotional Assessment   Attitude/Demeanor/Rapport: Intubated (Following Commands or Not Following Commands) Affect (typically observed): Unable to Assess   Alcohol / Substance Use: Not Applicable Psych Involvement: No (comment)  Admission diagnosis:  Cardiac arrest [facility]) [I46.9] Patient Active Problem List   Diagnosis Date Noted   Cardiac arrest ([place]) 10/12/2022   Constipation 08/12/2022   Acute heart failure ([place]) 08/09/2022   Hypervolemia 08/09/2022   Pleural  effusion 08/09/2022   Acute on chronic HFrEF (heart failure with reduced ejection fraction) ([place]) 08/08/2022   Goals of care, counseling/discussion 08/08/2022   Acute exacerbation of CHF (congestive heart failure) ([place]) 06/08/2020   Elevated troponin    Acute on chronic combined systolic and diastolic CHF (congestive heart failure) ([place]) 02/29/2020   SOB (shortness of breath) 02/28/2020   Acute kidney injury superimposed on CKD ([place]) 02/08/2020   Chronic venous insufficiency 12/21/2019   Nocturnal leg cramps 12/21/2019   Pleural effusion on left 12/08/2019   Shortness of breath 12/08/2019   Chest pain 05/25/2019   Esophageal dysphagia 05/09/2019   Functional diarrhea 05/09/2019   RUQ pain 05/09/2019   Other male erectile dysfunction 11/11/2018   Lumbar radiculitis 10/17/2018   Pedal edema 12/14/2017   Vertical diplopia 11/02/2017   Accelerated hypertension 10/27/2017   Thrombocytopenia ([place]) 02/28/2016   Anemia 02/28/2016   Hyperlipidemia 10/23/2014   CAD (coronary artery disease) 02/22/2014   Hypertension associated with diabetes ([place]) 02/22/2014   Type 2 diabetes mellitus with microalbuminuria, with long-term current use of insulin ([place]) 02/22/2014   PCP:  [name], MD Pharmacy:   [facility] [place] [address] Phone: [phone] Fax: [phone]     Social Determinants of Health (SDOH) Social History:  SDOH Screenings   Food Insecurity: No Food Insecurity (08/10/2022)  Housing: Low Risk  (08/10/2022)  Transportation Needs: No Transportation Needs (08/10/2022)  Utilities: Not At Risk (08/10/2022)  Tobacco Use: Low Risk  (09/18/2022)   SDOH Interventions:     Readmission Risk Interventions    06/09/2020   11:04 AM  Readmission Risk Prevention Plan  Transportation Screening Complete  PCP or Specialist Appt within 3-5 Days Complete  HRI or [place] Patient refused  Social Work  Consult for [place] Planning/Counseling Complete  Palliative Care Screening Not Applicable  Medication Review [employer]) Complete

## 2022-10-01 NOTE — Progress Notes (Signed)
PHARMACY CONSULT NOTE - FOLLOW UP  Pharmacy Consult for Electrolyte Monitoring and Replacement   Recent Labs: Potassium (mmol/L)  Date Value  10/01/2022 5.5 (H)   Magnesium (mg/dL)  Date Value  10/01/2022 1.9   Calcium (mg/dL)  Date Value  10/01/2022 8.0 (L)   Albumin (g/dL)  Date Value  10/01/2022 3.4 (L)   Phosphorus (mg/dL)  Date Value  10/01/2022 3.7   Sodium (mmol/L)  Date Value  10/01/2022 140     Assessment: 77 year old male presented to ED post witnessed cardiac arrest. PMH includes CHF (EF < 20%) AICD and permanent pacemaker, CABG x4, CKD4, COPD, T2DM, recurrent pleural effusion.   Scr 3.37 (Scr 2.25 in 07/2022). Hyperkalemic on 2/13 - received dextrose and insulin.   Goal of Therapy:  K 4-5 Mag > 2 All other electrolytes within normal limits  Plan:  Hyperkalemic K+ 5.9 > 5.5. Continue. Lokelma 10 grams Q8hr  Follow up K+ with AM labs   Dorothe Pea, PharmD, BCPS Clinical Pharmacist   10/01/2022 8:59 PM

## 2022-10-01 NOTE — Progress Notes (Signed)
PHARMACY CONSULT NOTE - FOLLOW UP  Pharmacy Consult for Electrolyte Monitoring and Replacement   Recent Labs: Potassium (mmol/L)  Date Value  10/01/2022 5.7 (H)   Magnesium (mg/dL)  Date Value  10/01/2022 2.2   Calcium (mg/dL)  Date Value  10/01/2022 8.4 (L)   Albumin (g/dL)  Date Value  10/01/2022 3.4 (L)   Phosphorus (mg/dL)  Date Value  10/01/2022 3.8   Sodium (mmol/L)  Date Value  10/01/2022 143     Assessment: 77 year old male presented to ED post witnessed cardiac arrest. PMH includes CHF (EF < 20%) AICD and permanent pacemaker, CABG x4, CKD4, COPD, T2DM, recurrent pleural effusion.   Scr 3.37 (Scr 2.25 in 07/2022). Hyperkalemic on 2/13 - received dextrose and insulin.   Goal of Therapy:  K 4-5 Mag > 2 All other electrolytes within normal limits  Plan:  Hyperkalemic K+ 5.7. Lokelma 10 grams Q8hr and Lasix IV 40 mg x 1. Follow up K+ 2/14 @1800$   Glean Salvo, PharmD, BCPS Clinical Pharmacist  10/01/2022 7:12 AM

## 2022-10-01 NOTE — Consult Note (Signed)
ANTICOAGULATION CONSULT NOTE - Initial Consult  Pharmacy Consult for heparin infusion - NO BOLUS Indication: atrial fibrillation  Allergies  Allergen Reactions   Pioglitazone Swelling   Furosemide Other (See Comments)    Pt may have had swelling in his face per spouse.  Drug discontinued     Metformin And Related Diarrhea   Prednisone     Other Reaction(s): Other (See Comments)  Face swells up and severe bleeding   Atorvastatin Rash   Benazepril Rash   Latex Rash    (tape only)   Other Rash    Pt reported that he is allergic to Ace wrap.  Reaction include severe skin irritation.   Tape Rash    Patient Measurements: Weight: 75.8 kg (167 lb 1.7 oz) Heparin Dosing Weight: 75.7 kg  Vital Signs: Temp: 99 F (37.2 C) (02/14 0700) Temp Source: Bladder (02/14 0700) BP: 126/104 (02/14 0700) Pulse Rate: 70 (02/14 0700)  Labs: Recent Labs    10/04/2022 1327 09/21/2022 1612 09/18/2022 1938 10/01/22 0010 10/01/22 0336  HGB 10.6* 10.9*  --   --  10.1*  HCT 31.8* 32.2*  --   --  28.7*  PLT 87* 85*  --   --  86*  LABPROT 16.4*  --   --   --  16.8*  INR 1.3*  --   --   --  1.4*  HEPARINUNFRC  --   --   --   --  0.18*  CREATININE 3.37* 3.27*  --   --  3.00*  TROPONINIHS 76* 415* 870* 3,551* 4,731*     Estimated Creatinine Clearance: 22.1 mL/min (A) (by C-G formula based on SCr of 3 mg/dL (H)).   Medical History: Past Medical History:  Diagnosis Date   AICD (automatic cardioverter/defibrillator) present    Arthritis    Cataract    Mixed OU   CHF (congestive heart failure) (HCC)    Chronic kidney disease    Complication of anesthesia    Difficulty inserting IV, wants expert at IV's, no multiple attempts   COPD (chronic obstructive pulmonary disease) ([place])    COVID-19 05/23/2022   Diabetes (HCC)    Type 2   Diabetic retinopathy (HCC)    NPDR OU   Heart disease    Hypertension    Hypertensive retinopathy    OU   Indigestion    Presence of permanent cardiac  pacemaker    Recurrent pleural effusion on left    Thrombocytopenia (HCC)    Wears dentures    full upper and lower    Medications:  PTA: N/A Inpatient: heparin infusion (2/13 >>>) Allergies: No AC/APT related allergies  Assessment: 77 year old male with history of thrombocytopenia and AICD presents to ED after witness sudden death episode. Pharmacy conuslted for management of heparin infusion in the setting of suspected ACS.  Platelets 86, Hgb 10.1  Date Time aPTT/HL Rate/Comment 2/14 0336 0.18  Subtherapeutic; 1000 units/hr   Goal of Therapy:  Heparin level 0.3-0.7 units/ml Monitor platelets by anticoagulation protocol: Yes NO BOLUS   Plan:  Increase heparin infusion to 1250 units/hr Check anti-Xa level in 8 hours and daily once consecutively therapeutic. Continue to monitor H&H and platelets daily while on heparin gtt.  Glean Salvo, PharmD, BCPS Clinical Pharmacist  10/01/2022 7:09 AM

## 2022-10-01 NOTE — Progress Notes (Signed)
Eeg done 

## 2022-10-01 NOTE — Progress Notes (Signed)
[facility]  ROUNDING NOTE   Subjective:   [name] was admitted to [facility] on 10/12/2022 for Cardiac arrest [facility]) [I46.9]  Patient was  last seen by Dr. Holley Raring on 01/07/2022. Patient was admitted to [facility] from 12/22 to 08/13/22 for acute exacerbation of congestive heart failure, most recent EF of 15-20%. Patient was discharged on torsemide 25m bid and spironolactone 273m   Patient started to have chest pain and shoulder pain 2 days ago. He then felt better and went to [place] to eat. He then had cardiac arrest. CPR was initiated. Patient did go into PEA arrest on route to the hospital. Patient was intubated and placed on amiodarone and norepinephrine.   History taken from chart and from family. Patient critically ill, intubated and on sedation.   Objective:  Vital signs in last 24 hours:  Temp:  [93.9 F (34.4 C)-99.7 F (37.6 C)] 99.7 F (37.6 C) (02/14 1015) Pulse Rate:  [54-125] 104 (02/14 1108) Resp:  [10-37] 18 (02/14 1108) BP: (77-147)/(47-104) 113/68 (02/14 1108) SpO2:  [90 %-100 %] 100 % (02/14 1112) FiO2 (%):  [40 %-100 %] 40 % (02/14 1112) Weight:  [75.7 kg-75.8 kg] 75.8 kg (02/14 0335)  Weight change:  Filed Weights   10/10/2022 1337 10/01/22 0335  Weight: 75.7 kg 75.8 kg    Intake/Output: I/O last 3 completed shifts: In: 3852.1 [I.V.:3215.6; NG/GT:300; IV Piggyback:336.5] Out: 725 [Urine:725]   Intake/Output this shift:  Total I/O In: 485.6 [I.V.:485.6] Out: 225 [Urine:225]  Physical Exam: General: Critically ill,   Head: ETT  Eyes: Anicteric   Neck: trachea midline  Lungs:  PRVC FiO2 40%  Heart: Ventricularly paced  Abdomen:  Soft, nontender  Extremities:  no peripheral edema.  Neurologic: Intubated, sedated  Skin: No lesions  Access: none    Basic Metabolic Panel: Recent Labs  Lab 10/15/2022 1327 10/08/2022 1606 09/28/2022 1612 09/23/2022 1938 10/01/22 0010 10/01/22 0336  NA 138  --  139  --   --  143  K 5.7*  --   6.0* 5.9* 5.8* 5.7*  CL 113*  --  114*  --   --  114*  CO2 13*  --  13*  --   --  21*  GLUCOSE 291*  --  309*  --   --  40*  BUN 94*  --  94*  --   --  89*  CREATININE 3.37*  --  3.27*  --   --  3.00*  CALCIUM 8.0*  --  8.7*  --   --  8.4*  MG  --  2.6*  --   --   --  2.2  PHOS  --  6.0*  --   --   --  3.8    Liver Function Tests: Recent Labs  Lab 10/03/2022 1327 10/01/22 0336  AST 167* 195*  ALT 210* 286*  ALKPHOS 67 71  BILITOT 0.9 1.2  PROT 6.4* 6.2*  ALBUMIN 3.6 3.4*   No results for input(s): "LIPASE", "AMYLASE" in the last 168 hours. No results for input(s): "AMMONIA" in the last 168 hours.  CBC: Recent Labs  Lab 09/19/2022 1327 10/04/2022 1612 10/01/22 0336  WBC 6.1 7.6 7.5  NEUTROABS 3.8  --  6.5  HGB 10.6* 10.9* 10.1*  HCT 31.8* 32.2* 28.7*  MCV 100.3* 97.9 94.7  PLT 87* 85* 86*    Cardiac Enzymes: No results for input(s): "CKTOTAL", "CKMB", "CKMBINDEX", "TROPONINI" in the last 168 hours.  BNP: Invalid input(s): "POCBNP"  CBG: Recent Labs  Lab 10/01/22 0434 10/01/22 0515 10/01/22 0606 10/01/22 0734 10/01/22 1117  GLUCAP 70 61* 97 71 76*    Microbiology: Results for orders placed or performed during the hospital encounter of 09/26/2022  Resp panel by RT-PCR (RSV, Flu A&B, Covid) Anterior Nasal Swab     Status: None   Collection Time: 10/07/2022  2:10 PM   Specimen: Anterior Nasal Swab  Result Value Ref Range Status   SARS Coronavirus 2 by RT PCR NEGATIVE NEGATIVE Final    Comment: (NOTE) SARS-CoV-2 target nucleic acids are NOT DETECTED.  The SARS-CoV-2 RNA is generally detectable in upper respiratory specimens during the acute phase of infection. The lowest concentration of SARS-CoV-2 viral copies this assay can detect is 138 copies/mL. A negative result does not preclude SARS-Cov-2 infection and should not be used as the sole basis for treatment or other patient management decisions. A negative result may occur with  improper specimen  collection/handling, submission of specimen other than nasopharyngeal swab, presence of viral mutation(s) within the areas targeted by this assay, and inadequate number of viral copies(<138 copies/mL). A negative result must be combined with clinical observations, patient history, and epidemiological information. The expected result is Negative.  Fact Sheet for Patients:  EntrepreneurPulse.com.au  Fact Sheet for Healthcare Providers:  IncredibleEmployment.be  This test is no t yet approved or cleared by the [place] FDA and  has been authorized for detection and/or diagnosis of SARS-CoV-2 by FDA under an Emergency Use Authorization (EUA). This EUA will remain  in effect (meaning this test can be used) for the duration of the COVID-19 declaration under Section 564(b)(1) of the Act, 21 U.S.C.section 360bbb-3(b)(1), unless the authorization is terminated  or revoked sooner.       Influenza A by PCR NEGATIVE NEGATIVE Final   Influenza B by PCR NEGATIVE NEGATIVE Final    Comment: (NOTE) The Xpert Xpress SARS-CoV-2/FLU/RSV plus assay is intended as an aid in the diagnosis of influenza from Nasopharyngeal swab specimens and should not be used as a sole basis for treatment. Nasal washings and aspirates are unacceptable for Xpert Xpress SARS-CoV-2/FLU/RSV testing.  Fact Sheet for Patients: EntrepreneurPulse.com.au  Fact Sheet for Healthcare Providers: IncredibleEmployment.be  This test is not yet approved or cleared by the [place] FDA and has been authorized for detection and/or diagnosis of SARS-CoV-2 by FDA under an Emergency Use Authorization (EUA). This EUA will remain in effect (meaning this test can be used) for the duration of the COVID-19 declaration under Section 564(b)(1) of the Act, 21 U.S.C. section 360bbb-3(b)(1), unless the authorization is terminated or revoked.     Resp Syncytial  Virus by PCR NEGATIVE NEGATIVE Final    Comment: (NOTE) Fact Sheet for Patients: EntrepreneurPulse.com.au  Fact Sheet for Healthcare Providers: IncredibleEmployment.be  This test is not yet approved or cleared by the [place] FDA and has been authorized for detection and/or diagnosis of SARS-CoV-2 by FDA under an Emergency Use Authorization (EUA). This EUA will remain in effect (meaning this test can be used) for the duration of the COVID-19 declaration under Section 564(b)(1) of the Act, 21 U.S.C. section 360bbb-3(b)(1), unless the authorization is terminated or revoked.  Performed at [facility], [address] [place], [place] [address]   MRSA Next Gen by PCR, Nasal     Status: None   Collection Time: 09/28/2022  4:33 PM   Specimen: Nasal Mucosa; Nasal Swab  Result Value Ref Range Status   MRSA by PCR Next Gen NOT  DETECTED NOT DETECTED Final    Comment: (NOTE) The GeneXpert MRSA Assay (FDA approved for NASAL specimens only), is one component of a comprehensive MRSA colonization surveillance program. It is not intended to diagnose MRSA infection nor to guide or monitor treatment for MRSA infections. Test performance is not FDA approved in patients less than 26 years old. Performed at [facility], [address] [place], [place] [address]     Coagulation Studies: Recent Labs    09/25/2022 1327 10/01/22 0336  LABPROT 16.4* 16.8*  INR 1.3* 1.4*    Urinalysis: No results for input(s): "COLORURINE", "LABSPEC", "PHURINE", "GLUCOSEU", "HGBUR", "BILIRUBINUR", "KETONESUR", "PROTEINUR", "UROBILINOGEN", "NITRITE", "LEUKOCYTESUR" in the last 72 hours.  Invalid input(s): "APPERANCEUR"    Imaging: DG Chest Port 1 View  Result Date: 10/01/2022 CLINICAL DATA:  Pulmonary edema EXAM: PORTABLE CHEST 1 VIEW COMPARISON:  09/26/2022 FINDINGS: Stable ET tube, enteric tube, left IJ catheter. Left upper chest defibrillator with  leads along the right side of the heart. Presumed contrast along the stomach at the edge of the imaging field. Enlarged heart with small left effusion and adjacent opacities. The effusion is decreasing. Persistent right lung base opacity. Vascular congestion. No pneumothorax. Overlapping cardiac leads. IMPRESSION: 1. Slight decrease in left effusion and opacity. Significant residual Electronically Signed   By: Jill Side M.D.   On: 10/01/2022 11:11   DG Chest 1 View  Result Date: 10/06/2022 CLINICAL DATA:  Central venous catheter placement EXAM: CHEST  1 VIEW COMPARISON:  09/24/2022 FINDINGS: Endotracheal tube is seen 3.8 cm above the carina. Nasogastric tube extends into the upper abdomen beyond the margin of the examination. Left internal jugular central venous catheter is been placed with its tip overlying the expected superior vena cava. The lungs are symmetrically well expanded. There is progressive interstitial pulmonary infiltrate most in keeping with pulmonary edema, asymmetrically more severe within the right perihilar region. No pneumothorax or pleural effusion. Coronary artery bypass grafting has been performed. Cardiac size is mildly enlarged. Left subclavian pacemaker defibrillator is unchanged. IMPRESSION: 1. Support tubes in appropriate position. Left internal jugular central venous catheter in appropriate position. No pneumothorax. 2. Progressive pulmonary edema. Electronically Signed   By: Fidela Salisbury M.D.   On: 10/08/2022 22:28   CT HEAD WO CONTRAST (5MM)  Result Date: 10/09/2022 CLINICAL DATA:  Mental status change, unknown cause. EXAM: CT HEAD WITHOUT CONTRAST CT CERVICAL SPINE WITHOUT CONTRAST TECHNIQUE: Multidetector CT imaging of the head and cervical spine was performed following the standard protocol without intravenous contrast. Multiplanar CT image reconstructions of the cervical spine were also generated. RADIATION DOSE REDUCTION: This exam was performed according to the  departmental dose-optimization program which includes automated exposure control, adjustment of the mA and/or kV according to patient size and/or use of iterative reconstruction technique. COMPARISON:  None Available. FINDINGS: CT HEAD FINDINGS Brain: No acute hemorrhage, mass effect or midline shift. Gray-white differentiation is preserved. No hydrocephalus. No extra-axial collection. Basilar cisterns are patent. Vascular: Extensive intracranial atherosclerosis. Skull: No calvarial fracture or suspicious bone lesion. Skull base is unremarkable. Sinuses/Orbits: Layering fluid in the sinuses and nasopharynx, likely secondary to endotracheal intubation. Other: None. CT CERVICAL SPINE FINDINGS Alignment: Normal. Skull base and vertebrae: Diffusely demineralized appearance of the bones. No acute fracture. Normal craniocervical junction. Mild degenerative changes of the C1-2 articulation. Soft tissues and spinal canal: No prevertebral fluid or swelling. No visible canal hematoma. Disc levels: Mild cervical spondylosis without high-grade spinal canal stenosis. Upper chest: Interlobular septal thickening, possibly due to  pulmonary edema. Other: Atherosclerotic calcifications of the carotid bulbs. IMPRESSION: CT HEAD: No acute intracranial abnormality. CT CERVICAL SPINE: 1. No acute fracture or traumatic listhesis of the cervical spine. 2. Interlobular septal thickening in the lung apices, possibly due to pulmonary edema. Electronically Signed   By: Emmit Alexanders M.D.   On: 10/09/2022 15:33   CT CERVICAL SPINE WO CONTRAST  Result Date: 09/27/2022 CLINICAL DATA:  Mental status change, unknown cause. EXAM: CT HEAD WITHOUT CONTRAST CT CERVICAL SPINE WITHOUT CONTRAST TECHNIQUE: Multidetector CT imaging of the head and cervical spine was performed following the standard protocol without intravenous contrast. Multiplanar CT image reconstructions of the cervical spine were also generated. RADIATION DOSE REDUCTION: This exam  was performed according to the departmental dose-optimization program which includes automated exposure control, adjustment of the mA and/or kV according to patient size and/or use of iterative reconstruction technique. COMPARISON:  None Available. FINDINGS: CT HEAD FINDINGS Brain: No acute hemorrhage, mass effect or midline shift. Gray-white differentiation is preserved. No hydrocephalus. No extra-axial collection. Basilar cisterns are patent. Vascular: Extensive intracranial atherosclerosis. Skull: No calvarial fracture or suspicious bone lesion. Skull base is unremarkable. Sinuses/Orbits: Layering fluid in the sinuses and nasopharynx, likely secondary to endotracheal intubation. Other: None. CT CERVICAL SPINE FINDINGS Alignment: Normal. Skull base and vertebrae: Diffusely demineralized appearance of the bones. No acute fracture. Normal craniocervical junction. Mild degenerative changes of the C1-2 articulation. Soft tissues and spinal canal: No prevertebral fluid or swelling. No visible canal hematoma. Disc levels: Mild cervical spondylosis without high-grade spinal canal stenosis. Upper chest: Interlobular septal thickening, possibly due to pulmonary edema. Other: Atherosclerotic calcifications of the carotid bulbs. IMPRESSION: CT HEAD: No acute intracranial abnormality. CT CERVICAL SPINE: 1. No acute fracture or traumatic listhesis of the cervical spine. 2. Interlobular septal thickening in the lung apices, possibly due to pulmonary edema. Electronically Signed   By: Emmit Alexanders M.D.   On: 10/12/2022 15:33   CT LUMBAR SPINE WO CONTRAST  Result Date: 09/27/2022 CLINICAL DATA:  Witnessed arrest, back pain EXAM: CT LUMBAR SPINE WITHOUT CONTRAST TECHNIQUE: Multidetector CT imaging of the lumbar spine was performed without intravenous contrast administration. Multiplanar CT image reconstructions were also generated. RADIATION DOSE REDUCTION: This exam was performed according to the departmental  dose-optimization program which includes automated exposure control, adjustment of the mA and/or kV according to patient size and/or use of iterative reconstruction technique. COMPARISON:  None Available. FINDINGS: Segmentation: Lumbar type vertebral bodies. Alignment: Normal lumbar lordosis. Vertebrae: No acute fracture or focal pathologic process. Paraspinal and other soft tissues: Cardiac ICD leads, incompletely visualized. Small loculated left pleural effusion, decreased from prior CT chest. Prior cholecystectomy. Extensive vascular calcifications. Disc levels: Mild multilevel degenerative changes, most prominent at L4-5 and L5-S1. Spinal canal is patent. IMPRESSION: No traumatic injury to the lumbar spine. Mild multilevel degenerative changes. Electronically Signed   By: Julian Hy M.D.   On: 10/15/2022 15:30   DG Chest 1 View  Result Date: 09/19/2022 CLINICAL DATA:  ETT and OG tube placement EXAM: CHEST  1 VIEW COMPARISON:  Chest x-ray dated August 08, 2022 FINDINGS: Cardiac and mediastinal contours unchanged status post median sternotomy. Left chest wall pacer unchanged lead position. ETT tip is approximally 5 cm from the carina. OG tube partially visualized coursing below the diaphragm. Mild heterogeneous opacities, likely due to pulmonary edema. Small left pleural effusion. No evidence of pneumothorax IMPRESSION: 1. ETT tip is 5 cm from the carina. 2. OG tube partially visualized coursing below the diaphragm. 3.  Mild heterogeneous opacities, likely due to pulmonary edema. 4. Small left pleural effusion. Electronically Signed   By: Yetta Glassman M.D.   On: 10/12/2022 14:47   DG Abdomen 1 View  Result Date: 09/19/2022 CLINICAL DATA:  OG tube placement EXAM: ABDOMEN - 1 VIEW COMPARISON:  02/11/2020. FINDINGS: OG tube tip is in the fundus of the stomach. Nonobstructive bowel gas pattern. IMPRESSION: OG tube in the stomach. Electronically Signed   By: Rolm Baptise M.D.   On: 10/08/2022 14:44      Medications:    sodium chloride 10 mL/hr at 10/01/22 1027   amiodarone 30 mg/hr (10/01/22 1027)   fentaNYL infusion INTRAVENOUS Stopped (10/01/22 1023)   heparin 1,250 Units/hr (10/01/22 1027)   norepinephrine (LEVOPHED) Adult infusion 13 mcg/min (10/01/22 1027)   sodium bicarbonate 150 mEq in dextrose 5 % 1,150 mL infusion 75 mL/hr at 10/01/22 1027    aspirin  81 mg Per Tube Daily   Chlorhexidine Gluconate Cloth  6 each Topical Daily   dextrose       famotidine  20 mg Per Tube Daily   insulin aspart  0-9 Units Subcutaneous Q4H   mouth rinse  15 mL Mouth Rinse Q2H   sodium zirconium cyclosilicate  10 g Per Tube Q8H   acetaminophen, dextrose, docusate sodium, fentaNYL, ipratropium-albuterol, midazolam, mouth rinse, polyethylene glycol  Assessment/ Plan:  Mr. ERON TRICARICO is a 77 y.o. white male with COPD, insulin dependent diabetes mellitus type II, hypertension, coronary artery disease, chronic systolic congestive heart failure, thrombocytopenia who is admitted to [facility] ICU on 09/22/2022 for Cardiac arrest [facility]) [I46.9]  Acute kidney injury on chronic kidney disease stage IV: baseline creatinine of 3.37, GFR of 29 on 08/13/22. Acute kidney injury complicated by hyperkalemia and metabolic acidosis. Acute kidney injury secondary to hypotension from cardiogenic shock. Nonoliguric urine output. Chronic kidney disease secondary to diabetes and hypertension. Patient with history of proteinuria. No acute indication for dialysis.  Have discussed dialysis with patient's life partner, children and patient's sister. Family willing to do dialysis if necessary.  Continue sodium bicarb gtt.  Continue potassium binder, Lokelma.  Holding Entresto and PO potassium chloride.   Acute Respiratory Failure: requiring intubation and mechanical ventilation. History of recurrent left pleural effusion.  Cardiogenic shock: status post PEA arrest. Currently with hypotension and requiring norepinephrine  gtt. Placed on amiodarone by cardiology.   Anemia with chronic kidney disease: hemoglobin 10.1, normocytotic. Patient's thrombocytopenia is at baseline. No indication for ESA.   Diabetes mellitus type II with chronic kidney disease: insulin dependent. Hemoglobin A1c of 6.4% on 08/08/22. Continue glucose control.    LOS: 1 Loreley Schwall 2/14/202412:07 PM

## 2022-10-01 NOTE — Consult Note (Signed)
ANTICOAGULATION CONSULT NOTE - Initial Consult  Pharmacy Consult for heparin infusion - NO BOLUS Indication: atrial fibrillation  Allergies  Allergen Reactions   Pioglitazone Swelling   Furosemide Other (See Comments)    Pt may have had swelling in his face per spouse.  Drug discontinued     Metformin And Related Diarrhea   Prednisone     Other Reaction(s): Other (See Comments)  Face swells up and severe bleeding   Atorvastatin Rash   Benazepril Rash   Latex Rash    (tape only)   Other Rash    Pt reported that he is allergic to Ace wrap.  Reaction include severe skin irritation.   Tape Rash    Patient Measurements: Weight: 75.8 kg (167 lb 1.7 oz) Heparin Dosing Weight: 75.7 kg  Vital Signs: Temp: 98.2 F (36.8 C) (02/14 1530) Temp Source: Bladder (02/14 0700) BP: 98/65 (02/14 1530) Pulse Rate: 110 (02/14 1530)  Labs: Recent Labs    10/03/2022 1327 10/03/2022 1612 09/27/2022 1938 10/01/22 0336 10/01/22 1111 10/01/22 1319 10/01/22 1500  HGB 10.6* 10.9*  --  10.1*  --   --   --   HCT 31.8* 32.2*  --  28.7*  --   --   --   PLT 87* 85*  --  86*  --   --   --   LABPROT 16.4*  --   --  16.8*  --   --   --   INR 1.3*  --   --  1.4*  --   --   --   HEPARINUNFRC  --   --   --  0.18*  --   --  0.77*  CREATININE 3.37* 3.27*  --  3.00* 2.73*  --   --   TROPONINIHS 76* 415*   < > 4,731* 4,236* 3,678*  --    < > = values in this interval not displayed.     Estimated Creatinine Clearance: 24.3 mL/min (A) (by C-G formula based on SCr of 2.73 mg/dL (H)).   Medical History: Past Medical History:  Diagnosis Date   AICD (automatic cardioverter/defibrillator) present    Arthritis    Cataract    Mixed OU   CHF (congestive heart failure) (HCC)    Chronic kidney disease    Complication of anesthesia    Difficulty inserting IV, wants expert at IV's, no multiple attempts   COPD (chronic obstructive pulmonary disease) ([place])    COVID-19 05/23/2022   Diabetes (HCC)    Type 2    Diabetic retinopathy (HCC)    NPDR OU   Heart disease    Hypertension    Hypertensive retinopathy    OU   Indigestion    Presence of permanent cardiac pacemaker    Recurrent pleural effusion on left    Thrombocytopenia (HCC)    Wears dentures    full upper and lower    Medications:  PTA: N/A Inpatient: heparin infusion (2/13 >>>) Allergies: No AC/APT related allergies  Assessment: 77 year old male with history of thrombocytopenia and AICD presents to ED after witness sudden death episode. Pharmacy conuslted for management of heparin infusion in the setting of suspected ACS.  Platelets 86, Hgb 10.1  Date Time aPTT/HL Rate/Comment 2/14 0336 0.18  Subtherapeutic; 1000 units/hr  2/14 1500 0.77  Supratherapeutic; 1250 units/hr  Goal of Therapy:  Heparin level 0.3-0.7 units/ml Monitor platelets by anticoagulation protocol: Yes NO BOLUS   Plan:  Reduce heparin infusion to 1150 units/hr Check anti-Xa  level in 8 hours and daily once consecutively therapeutic. Continue to monitor H&H and platelets daily while on heparin gtt.  Rick Zuniga, PharmD, BCPS Clinical Pharmacist  10/01/2022 3:51 PM

## 2022-10-01 NOTE — Progress Notes (Signed)
NAME:  Rick Zuniga, MRN:  [id number], DOB:  08-Dec-1945, LOS: 1 ADMISSION DATE:  09/18/2022, CONSULTATION DATE: 10/07/2022 REFERRING MD: Dr. Cheri Fowler, CHIEF COMPLAINT: Cardiac Arrest    History of Present Illness:  This is a 77 yo male who presented to [facility] ER on 02/13 post witnessed cardiac arrest.  According to pts significant other the pt c/o bilateral shoulder and upper back pain radiating to the left leg on 02/12.  On 02/13 the pt stated he felt better and wanted to go to [place] to eat.  When they arrived at the restaurant he had a witnessed cardiac arrest, bystander CPR initiated.  Estimated downtime prior to EMS arrival was 8 minutes.  EMS arrived on the scene pt in v-fib requiring defibrillation x1.  He subsequently went into PEA requiring 3 rounds of CPR prior to ROSC.    ER Course Upon arrival to the ER pt unresponsive.  He PEA arrested again, ACLS protocol initiated with ROSC achieved 3 minutes following initiation.  Pt mechanically intubated post cardiac arrest.  Levophed gtt started for hypotension.  Pt also received amiodarone bolus followed by amiodarone gtt.  Cardiology evaluated pt and agreed with medical management.  ABG revealed severe metabolic acidosis: pH 123456 37/pO2 351/acid-base deficit 17.8/bicarb 11.2.  PCCM team contacted for ICU admission.  Medications received: 1g calcium/75 mg rocuronium/fentanyl gtt @50$  mcg/hr/2 amps of sodium bicarb/amiodarone bolus followed by amiodarone gtt  Initial EKG: Ventricular paced, heart rate 116, no evidence of acute ischemia  Significant lab results: K+ 5.7/chloride 113/CO2 13/glucose 291/BUN 94/creatinine 3.37/calcium 8.0/AST 167/ALT 210/hgb 10.6/platelets 87/PT 16.4/INR 1.3.  COVID-19/Influenza A&B negative  CXR: ETT tip is 5 cm from the carina. OG tube partially visualized coursing below the diaphragm. Mild heterogeneous opacities, likely due to pulmonary edema.  Small left pleural effusion. KUB: OG tube tip is in the  fundus of the stomach. Nonobstructive bowel gas pattern.  CT Head/Cervical Spine: No acute intracranial abnormality. No acute fracture or traumatic listhesis of the cervical spine. Interlobular septal thickening in the lung apices, possibly due to pulmonary edema. CT Lumbar Spine: No traumatic injury to the lumbar spine. Mild multilevel degenerative changes.   10/01/22- patient remains critically ill, he is on PRVC weaned to 40% more rhonchorous today.  Blood gas improved overnight and urine output has increased despite AKI.  We were unable to get MRI brain today due to PPM.  Considering EEG today , stopped sedation for neuroevaluation.   Pertinent  Medical History  AICD Permanent Cardiac Pacemaker  Arthritis  Cataract  CHF  COPD COVID-19 (05/23/2022) Uncontrolled Type II Diabetes Mellitus  HTN  Hypertensive Retinopathy  Indigestion Thrombocytopenia  Recurrent Pleural Effusion DVT Aortic Atherosclerosis  CAD  HLD  Chronic Systolic Diastolic CHF : Echo [id number] <20% CABG x4 Stage IV CKD   Significant Hospital Events: Including procedures, antibiotic start and stop dates in addition to other pertinent events   02/13: Pt admitted post cardiac arrest mechanically intubated artic sun initiated    Interim History / Subjective:  Pt unresponsive mechanically intubated requiring levophed gtt @ 72mg/min and amiodarone gtt infusing at 60 mg/hr   Objective   Blood pressure 101/63, pulse 74, temperature 99.5 F (37.5 C), resp. rate 16, weight 75.8 kg, SpO2 100 %. CVP:  [4 mmHg-13 mmHg] 5 mmHg  Vent Mode: PRVC FiO2 (%):  [40 %-100 %] 40 % Set Rate:  [18 bmp] 18 bmp Vt Set:  [470 mL-550 mL] 550 mL PEEP:  [5 cmH20] 5 cmH20 Plateau Pressure:  [  24 cmH20] 24 cmH20   Intake/Output Summary (Last 24 hours) at 10/01/2022 0956 Last data filed at 10/01/2022 [id number] Gross per 24 hour  Intake 4143.52 ml  Output 880 ml  Net 3263.52 ml   Filed Weights   10/15/2022 1337 10/01/22 0335  Weight:  75.7 kg 75.8 kg   Examination: General: Acute on chronically-ill appearing male, NAD mechanically intubated  HENT: Supple, no JVD  Lungs: Diminished throughout, even, non labored  Cardiovascular: Ventricular-paced, no m/r/g, 2+ radial/1+ distal pulses, no edema  Abdomen: +BS x4, soft, non distended Extremities: Normal bulk and tone Neuro: Sedated, not following commands or withdrawing from painful stimulation, right pupil unequal and non reactive/left pupil 2 mm reactive  GU: Indwelling foley catheter draining clear yellow urine   Resolved Hospital Problem list    Assessment & Plan:  Mechanical intubation post cardiac arrest  Hx: COPD and chronic pleural effusion - Full vent support for now: vent settings reviewed and established - Continue lung protective strategies  - SBT once all parameters met  - Prn bronchodilator therapy  - Continue normothermia protocol - Maintain RASS goal of -4 during normothermia protocol  - PAD protocol to maintain RASS goal: Fentanyl and propofol gtts  Cardiac arrest (cardiac rhythm: PEA and v-fib)  Cardiogenic shock  Acute 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  Elevated troponin post cardiac arrest  Hx: AICD, permanent pacemaker, CAD s/p CABG, and HTN   Echo 08/10/22: EF <20% with Grade III diastolic dysfunction (restrictive) and right ventricular systolic function mildly reduced  - Continuous telemetry monitoring  - Cautious iv fluid resuscitation and prn levophed gtt to maintain map >65 - Hold outpatient diuretics, beta-blockers, and anti-hypertensive medications for now   - Trend troponin's until peaked  - Cardiology consulted appreciate input: continue amiodarone gtt   Acute kidney injury on stage IV CKD with hyperkalemia and severe metabolic acidosis secondary to ATN and concerning for cardiorenal syndrome  - Trend BMP  - Replace electrolytes as indicated  - Monitor UOP  -  Nephrology consulted appreciate input   Anemia of of chronic kidney disease  Chronic thrombocytopenia  - Trend CBC  - Monitor for s/sx of bleeding  - Transfuse for hgb <7 and/or platelet count of <50,000 - Monitor for s/sx of bleeding   Shock liver  Chronic thrombocytopenia  - Trend hepatic function panel  - Avoid hepatotoxic medications   Type II diabetes mellitus  - Hemoglobin A1c pending  - CBG's q4hrs - SSI   Acute metabolic encephalopathy post cardiac arrest concerning for possible anoxic injury  - Avoid sedating medications when able  - WUA once normothermia protocol complete - If mentation does not improve in the next 72hrs post normothermia protocol will order MRI Brain and neurology consult   Best Practice (right click and "Reselect all SmartList Selections" daily)   Diet/type: NPO DVT prophylaxis: prophylactic heparin  GI prophylaxis: H2B Lines: N/A Foley:  Yes, and it is still needed Code Status:  full code Last date of multidisciplinary goals of care discussion [N/A]  Will discuss current plan of care, pt prognosis, and code status once daughter and son are at bedside.  Will consult palliative care to assist with goals of care discussions.   Labs   CBC: Recent Labs  Lab 10/05/2022 1327 10/01/2022 1612 10/01/22 0336  WBC 6.1 7.6 7.5  NEUTROABS 3.8  --  6.5  HGB 10.6* 10.9* 10.1*  HCT 31.8* 32.2* 28.7*  MCV 100.3*  97.9 94.7  PLT 87* 85* 86*    Basic Metabolic Panel: Recent Labs  Lab 10/13/2022 1327 10/01/2022 1606 09/24/2022 1612 10/07/2022 1938 10/01/22 0010 10/01/22 0336  NA 138  --  139  --   --  143  K 5.7*  --  6.0* 5.9* 5.8* 5.7*  CL 113*  --  114*  --   --  114*  CO2 13*  --  13*  --   --  21*  GLUCOSE 291*  --  309*  --   --  40*  BUN 94*  --  94*  --   --  89*  CREATININE 3.37*  --  3.27*  --   --  3.00*  CALCIUM 8.0*  --  8.7*  --   --  8.4*  MG  --  2.6*  --   --   --  2.2  PHOS  --  6.0*  --   --   --  3.8   GFR: Estimated Creatinine  Clearance: 22.1 mL/min (A) (by C-G formula based on SCr of 3 mg/dL (H)). Recent Labs  Lab 09/18/2022 1327 10/01/2022 1612 10/13/2022 1938 10/01/22 0010 10/01/22 0336  PROCALCITON  --  <0.10  --   --  3.15  WBC 6.1 7.6  --   --  7.5  LATICACIDVEN  --  2.8* 5.2* 3.1* 2.0*    Liver Function Tests: Recent Labs  Lab 10/01/2022 1327 10/01/22 0336  AST 167* 195*  ALT 210* 286*  ALKPHOS 67 71  BILITOT 0.9 1.2  PROT 6.4* 6.2*  ALBUMIN 3.6 3.4*   No results for input(s): "LIPASE", "AMYLASE" in the last 168 hours. No results for input(s): "AMMONIA" in the last 168 hours.  ABG    Component Value Date/Time   PHART 7.09 (LL) 10/08/2022 1426   PCO2ART 37 10/01/2022 1426   PO2ART 351 (H) 09/24/2022 1426   HCO3 22.1 10/01/2022 0400   ACIDBASEDEF 4.6 (H) 10/01/2022 0400   O2SAT 70.5 10/01/2022 0400     Coagulation Profile: Recent Labs  Lab 10/04/2022 1327 10/01/22 0336  INR 1.3* 1.4*    Cardiac Enzymes: No results for input(s): "CKTOTAL", "CKMB", "CKMBINDEX", "TROPONINI" in the last 168 hours.  HbA1C: Hgb A1c MFr Bld  Date/Time Value Ref Range Status  08/10/2022 05:44 AM 6.4 (H) 4.8 - 5.6 % Final    Comment:    (NOTE)         Prediabetes: 5.7 - 6.4         Diabetes: >6.4         Glycemic control for adults with diabetes: <7.0   06/08/2020 11:13 AM 10.9 (H) 4.8 - 5.6 % Final    Comment:    (NOTE)         Prediabetes: 5.7 - 6.4         Diabetes: >6.4         Glycemic control for adults with diabetes: <7.0     CBG: Recent Labs  Lab 10/01/22 0325 10/01/22 0434 10/01/22 0515 10/01/22 0606 10/01/22 0734  GLUCAP 65* 70 61* 97 71    Review of Systems:   Unable to assess pt mechanically intubated   Past Medical History:  He,  has a past medical history of AICD (automatic cardioverter/defibrillator) present, Arthritis, Cataract, CHF (congestive heart failure) ([place]), Chronic kidney disease, Complication of anesthesia, COPD (chronic obstructive pulmonary disease) ([place]),  COVID-19 (05/23/2022), Diabetes ([place]), Diabetic retinopathy ([place]), Heart disease, Hypertension, Hypertensive retinopathy, Indigestion, Presence of permanent cardiac  pacemaker, Recurrent pleural effusion on left, Thrombocytopenia ([place]), and Wears dentures.   Surgical History:   Past Surgical History:  Procedure Laterality Date   ANTERIOR VITRECTOMY Right 07/02/2022   Procedure: ANTERIOR VITRECTOMY;  Surgeon: Leandrew Koyanagi, MD;  Location: [facility];  Service: Ophthalmology;  Laterality: Right;   APPENDECTOMY     BI-VENTRICULAR IMPLANTABLE CARDIOVERTER DEFIBRILLATOR  (CRT-D)  05/20/2022   CARDIAC CATHETERIZATION Right 04/16/2022   CARDIAC SURGERY  12/21/2009   bypass. 4 vessel   CATARACT EXTRACTION W/PHACO Left 08/07/2021   Procedure: CATARACT EXTRACTION PHACO AND INTRAOCULAR LENS PLACEMENT (IOC) LEFT DIABETIC 9.06 01:42.2;  Surgeon: Leandrew Koyanagi, MD;  Location: [facility];  Service: Ophthalmology;  Laterality: Left;  Diabetic   CATARACT EXTRACTION W/PHACO Right 07/02/2022   Procedure: CATARACT EXTRACTION PHACO AND INTRAOCULAR LENS PLACEMENT (IOC) RIGHT VISION BLUE HEALON 5 DIABETIC  10.62  01:16.7;  Surgeon: Leandrew Koyanagi, MD;  Location: [facility];  Service: Ophthalmology;  Laterality: Right;   CATARACT EXTRACTION W/PHACO Right 07/16/2022   Procedure: REMOVAL OF LENS FRAMENTS RIGHT DIABETIC;  Surgeon: Leandrew Koyanagi, MD;  Location: [facility];  Service: Ophthalmology;  Laterality: Right;  patient wants last   CHOLECYSTECTOMY     COLONOSCOPY  12/23/2010   IR THORACENTESIS ASP PLEURAL SPACE W/IMG GUIDE  07/18/2022   THORACENTESIS     02/25/21, 10/17/21     Social History:   reports that he has never smoked. He has never used smokeless tobacco. He reports that he does not drink alcohol and does not use drugs.   Family History:  His family history includes CAD in his mother; Diabetes in his father; Kidney failure in his father.    Allergies Allergies  Allergen Reactions   Pioglitazone Swelling   Furosemide Other (See Comments)    Pt may have had swelling in his face per spouse.  Drug discontinued     Metformin And Related Diarrhea   Prednisone     Other Reaction(s): Other (See Comments)  Face swells up and severe bleeding   Atorvastatin Rash   Benazepril Rash   Latex Rash    (tape only)   Other Rash    Pt reported that he is allergic to Ace wrap.  Reaction include severe skin irritation.   Tape Rash     Home Medications  Prior to Admission medications   Medication Sig Start Date End Date Taking? Authorizing Provider  acetaminophen (TYLENOL) 325 MG tablet Take 325 mg by mouth every 4 (four) hours as needed.    [provider]  aspirin EC 81 MG tablet Take 81 mg by mouth daily.    [provider]  Continuous Blood Gluc Receiver (FREESTYLE LIBRE 14 DAY READER) DEVI Use 1 Device as directed Dx: E11.29 02/22/20   [provider]  Continuous Blood Gluc Sensor (FREESTYLE LIBRE 14 DAY SENSOR) MISC SMARTSIG:1 Kit(s) Topical Every 2 Weeks 10/29/20   [provider]  glipiZIDE (GLUCOTROL) 5 MG tablet Take 5 mg by mouth daily before breakfast. 07/28/22   [provider]  insulin degludec (TRESIBA) 100 UNIT/ML FlexTouch Pen Inject 10 Units into the skin daily.    [provider]  meclizine (ANTIVERT) 25 MG tablet Take 1 tablet (25 mg total) by mouth 3 (three) times daily as needed for dizziness. Patient not taking: Reported on 09/18/2022 03/02/20   Sharen Hones, MD  metoprolol succinate (TOPROL-XL) 25 MG 24 hr tablet Take 0.5 tablets (12.5 mg total) by mouth daily. 08/28/22   Darylene Price  A, FNP  nitroGLYCERIN (NITROSTAT) 0.4 MG SL tablet Place 0.4 mg under the tongue every 5 (five) minutes as needed for chest pain. Patient not taking: Reported on 08/28/2022    [provider]  potassium chloride (KLOR-CON) 10 MEQ tablet Take 1 tablet (10 mEq total) by mouth 2  (two) times daily. 08/28/22   Alisa Graff, FNP  sacubitril-valsartan (ENTRESTO) 24-26 MG Take 1 tablet by mouth 2 (two) times daily. 08/28/22   Alisa Graff, FNP  senna-docusate (SENOKOT-S) 8.6-50 MG tablet Take 1 tablet by mouth 2 (two) times daily. 08/13/22   Raiford Noble Latif, DO  spironolactone (ALDACTONE) 25 MG tablet Take 1 tablet (25 mg total) by mouth daily. 08/28/22   Alisa Graff, FNP  tamsulosin (FLOMAX) 0.4 MG CAPS capsule Take 0.4 mg by mouth daily.    [provider]  torsemide (DEMADEX) 20 MG tablet Take by mouth daily. 20 mg in the AM and 20 mg in the PM as needed    [provider]     Critical care provider statement:   Total critical care time: 33 minutes   Performed by: Lanney Gins MD   Critical care time was exclusive of separately billable procedures and treating other patients.   Critical care was necessary to treat or prevent imminent or life-threatening deterioration.   Critical care was time spent personally by me on the following activities: development of treatment plan with patient and/or surrogate as well as nursing, discussions with consultants, evaluation of patient's response to treatment, examination of patient, obtaining history from patient or surrogate, ordering and performing treatments and interventions, ordering and review of laboratory studies, ordering and review of radiographic studies, pulse oximetry and re-evaluation of patient's condition.    Ottie Glazier, M.D.  Pulmonary & Critical Care Medicine

## 2022-10-01 NOTE — IPAL (Addendum)
GOALS OF CARE FAMILY CONFERENCE   Current clinical status, hospital findings and medical plan was reviewed with family.   Present in meeting was daughter and son of patient as well as brother and sister of patient and his girlfriend.   Updated and notified of patients ongoing immediate critical medical problems.   Patient remains unresponsive acutely comatose    Patient is unable to breathe independently, unable to protect airway and unable to mobilize secretions.    Explained to family course of therapy and the modalities   Patient with Progressive multiorgan failure with high probability of a very minimal chance of meaningful recovery despite aggressive and optimal medical therapy.   Family is appreciative of care and relate understanding that patient is severely critically ill with anticipation of passing away during this hospitalization.   They have consented and agreed to DNR/DNI  Code status   Family are satisfied with Plan of action and management. All questions answered  Additional Critical Care time 35 mins    Ottie Glazier, M.D.  Pulmonary & [place]

## 2022-10-01 NOTE — Progress Notes (Signed)
Initial Nutrition Assessment  DOCUMENTATION CODES:   Non-severe (moderate) malnutrition in context of chronic illness  INTERVENTION:   -TF via OGT:   Initiate Vital AF 1.2 @ 20 ml/hr and increase by 10 ml every 4 hours to goal rate of 65 ml/hr.   30 ml free water flush every 4 hours to maintain tube patency  Tube feeding regimen provides 1872 kcal (97% of needs), 117 grams of protein, and 1265 ml of H2O. Total free water: 1445 ml daily   -Monitor Mg, K, and Phos daily and replete as needed secondary to high refeeding risk  NUTRITION DIAGNOSIS:   Moderate Malnutrition related to chronic illness (COPD) as evidenced by mild fat depletion, moderate fat depletion, moderate muscle depletion, severe muscle depletion.  GOAL:   Patient will meet greater than or equal to 90% of their needs  MONITOR:   Vent status, TF tolerance  REASON FOR ASSESSMENT:   Ventilator    ASSESSMENT:   Pt with PMH of DM, COPD, and AICD admitted s/p cardiac arrest.  Pt admitted with cardiac arrest.    Patient is currently intubated on ventilator support. Per KUB, OGT tip in the fundus of stomach; currently clamped. MV: 9.7 L/min Temp (24hrs), Avg:98.1 F (36.7 C), Min:94.3 F (34.6 C), Max:100.6 F (38.1 C)  Reviewed I/O's: +3.1 L x 24 hours   UOP: 725 ml x 24 hours  Per nephrology notes, no plan for HD at this time.   Case discussed with RN, who reports that pt has been off sedation, however, unsure if pt with purposeful movement. Pt's blood sugars have dropped and was recently given dextrose to correct this.   Case discussed with MD; received permission to start TF.   Reviewed wt hx; pt has experienced a 6.4% wt loss over the past 3 months. While this is not significant for time frame, it is concerning given pt's advanced age and multiple co-morbidities.   Palliative care consult pending for GOC.   Medications reviewed and include lokelma, 0.9% sodium chloride infusion @ 10 ml/hr,  amiodarone, sodium bicarbonate.   Lab Results  Component Value Date   HGBA1C 6.4 (H) 08/10/2022   PTA DM medications are 5 mg glipizide daily.   Labs reviewed: K: 5.9, CBGS: 121-135 (inpatient orders for glycemic control are 0-9 units insulin aspart every 4 hours).    NUTRITION - FOCUSED PHYSICAL EXAM:  Flowsheet Row Most Recent Value  Orbital Region Severe depletion  Upper Arm Region Moderate depletion  Thoracic and Lumbar Region Mild depletion  Buccal Region Moderate depletion  Temple Region Severe depletion  Clavicle Bone Region Mild depletion  Clavicle and Acromion Bone Region Mild depletion  Scapular Bone Region Mild depletion  Dorsal Hand Severe depletion  Patellar Region Moderate depletion  Anterior Thigh Region Moderate depletion  Posterior Calf Region Moderate depletion  Edema (RD Assessment) None  Hair Reviewed  Eyes Reviewed  Mouth Reviewed  Skin Reviewed  Nails Reviewed       Diet Order:   Diet Order             Diet NPO time specified  Diet effective now                   EDUCATION NEEDS:   Not appropriate for education at this time  Skin:  Skin Assessment: Reviewed RN Assessment  Last BM:  Unknown  Height:   Ht Readings from Last 1 Encounters:  09/18/22 6' 1"$  (1.854 m)    Weight:  Wt Readings from Last 1 Encounters:  10/01/22 75.8 kg    Ideal Body Weight:  83.6 kg  BMI:  Body mass index is 22.05 kg/m.  Estimated Nutritional Needs:   Kcal:  1932  Protein:  100-115 grams  Fluid:  > 1.9 L    Loistine Chance, RD, LDN, [place] Registered Dietitian II Certified Diabetes Care and Education Specialist Please refer to [facility] for RD and/or RD on-call/weekend/after hours pager

## 2022-10-01 NOTE — Procedures (Signed)
Routine EEG Report  Rick Zuniga is a 77 y.o. male with a history of cardiac arrest who is undergoing an EEG to evaluate for seizures.  Report: This EEG was acquired with electrodes placed according to the International 10-20 electrode system (including Fp1, Fp2, F3, F4, C3, C4, P3, P4, O1, O2, T3, T4, T5, T6, A1, A2, Fz, Cz, Pz). The following electrodes were missing or displaced: none.  The best background was 3-5 Hz. This activity was continuous and reactive to stimulation. There was no clear waking background or sleep architecture. There was no focal slowing.There were frequent triphasic waves that at times became periodic up to 1 Hz for up to 10 seconds. There were no definitive interictal epileptiform discharges. Photic stimulation and hyperventilation were not performed.   Impression and clinical correlation: This EEG was obtained while comatose and is abnormal due to: - Moderate to severe slowing diffuse slowing - Triphasic waves at times periodic up to 1 Hz that did no appear particularly epileptiform. This pattern is typically associated with metabolic encephalopathy and would be considered to be on the ictal-interictal continuum with low change of evolution to seizure. - No definitive epileptiform discharges were seen - If patient's mental status does not improve off sedation consider repeat EEG in 1-2 days  Su Monks, MD [facility] [phone]  If 7pm- 7am, please page neurology on call as listed in [place].

## 2022-10-02 LAB — HEPATIC FUNCTION PANEL
ALT: 177 U/L — ABNORMAL HIGH (ref 0–44)
AST: 85 U/L — ABNORMAL HIGH (ref 15–41)
Albumin: 3.1 g/dL — ABNORMAL LOW (ref 3.5–5.0)
Alkaline Phosphatase: 68 U/L (ref 38–126)
Bilirubin, Direct: 0.4 mg/dL — ABNORMAL HIGH (ref 0.0–0.2)
Indirect Bilirubin: 1.5 mg/dL — ABNORMAL HIGH (ref 0.3–0.9)
Total Bilirubin: 1.9 mg/dL — ABNORMAL HIGH (ref 0.3–1.2)
Total Protein: 5.8 g/dL — ABNORMAL LOW (ref 6.5–8.1)

## 2022-10-02 LAB — BLOOD GAS, VENOUS
Acid-base deficit: 1.8 mmol/L (ref 0.0–2.0)
Bicarbonate: 23.7 mmol/L (ref 20.0–28.0)
O2 Saturation: 67.3 %
Patient temperature: 37
pCO2, Ven: 42 mmHg — ABNORMAL LOW (ref 44–60)
pH, Ven: 7.36 (ref 7.25–7.43)
pO2, Ven: 36 mmHg (ref 32–45)

## 2022-10-02 LAB — CBC WITH DIFFERENTIAL/PLATELET
Abs Immature Granulocytes: 0.35 10*3/uL — ABNORMAL HIGH (ref 0.00–0.07)
Basophils Absolute: 0 10*3/uL (ref 0.0–0.1)
Basophils Relative: 0 %
Eosinophils Absolute: 0 10*3/uL (ref 0.0–0.5)
Eosinophils Relative: 0 %
HCT: 26.7 % — ABNORMAL LOW (ref 39.0–52.0)
Hemoglobin: 9 g/dL — ABNORMAL LOW (ref 13.0–17.0)
Immature Granulocytes: 9 %
Lymphocytes Relative: 19 %
Lymphs Abs: 0.7 10*3/uL (ref 0.7–4.0)
MCH: 33.6 pg (ref 26.0–34.0)
MCHC: 33.7 g/dL (ref 30.0–36.0)
MCV: 99.6 fL (ref 80.0–100.0)
Monocytes Absolute: 0.2 10*3/uL (ref 0.1–1.0)
Monocytes Relative: 4 %
Neutro Abs: 2.6 10*3/uL (ref 1.7–7.7)
Neutrophils Relative %: 68 %
Platelets: 68 10*3/uL — ABNORMAL LOW (ref 150–400)
RBC: 2.68 MIL/uL — ABNORMAL LOW (ref 4.22–5.81)
RDW: 12.7 % (ref 11.5–15.5)
Smear Review: NORMAL
WBC: 3.9 10*3/uL — ABNORMAL LOW (ref 4.0–10.5)
nRBC: 0 % (ref 0.0–0.2)

## 2022-10-02 LAB — GLUCOSE, CAPILLARY
Glucose-Capillary: 108 mg/dL — ABNORMAL HIGH (ref 70–99)
Glucose-Capillary: 252 mg/dL — ABNORMAL HIGH (ref 70–99)
Glucose-Capillary: 248 mg/dL — ABNORMAL HIGH (ref 70–99)
Glucose-Capillary: 207 mg/dL — ABNORMAL HIGH (ref 70–99)

## 2022-10-02 LAB — CBC
HCT: 27.4 % — ABNORMAL LOW (ref 39.0–52.0)
Hemoglobin: 9.4 g/dL — ABNORMAL LOW (ref 13.0–17.0)
MCH: 33.1 pg (ref 26.0–34.0)
MCHC: 34.3 g/dL (ref 30.0–36.0)
MCV: 96.5 fL (ref 80.0–100.0)
Platelets: 56 10*3/uL — ABNORMAL LOW (ref 150–400)
RBC: 2.84 MIL/uL — ABNORMAL LOW (ref 4.22–5.81)
RDW: 12.4 % (ref 11.5–15.5)
WBC: 7.1 10*3/uL (ref 4.0–10.5)
nRBC: 0 % (ref 0.0–0.2)

## 2022-10-02 LAB — PROCALCITONIN: Procalcitonin: 13.56 ng/mL

## 2022-10-02 LAB — RENAL FUNCTION PANEL
Albumin: 3 g/dL — ABNORMAL LOW (ref 3.5–5.0)
Anion gap: 11 (ref 5–15)
BUN: 82 mg/dL — ABNORMAL HIGH (ref 8–23)
CO2: 23 mmol/L (ref 22–32)
Calcium: 7.6 mg/dL — ABNORMAL LOW (ref 8.9–10.3)
Chloride: 103 mmol/L (ref 98–111)
Creatinine, Ser: 3.09 mg/dL — ABNORMAL HIGH (ref 0.61–1.24)
GFR, Estimated: 20 mL/min — ABNORMAL LOW (ref >60)
Glucose, Bld: 278 mg/dL — ABNORMAL HIGH (ref 70–99)
Phosphorus: 6 mg/dL — ABNORMAL HIGH (ref 2.5–4.6)
Potassium: 4.8 mmol/L (ref 3.5–5.1)
Sodium: 137 mmol/L (ref 135–145)

## 2022-10-02 LAB — BASIC METABOLIC PANEL
Anion gap: 13 (ref 5–15)
BUN: 84 mg/dL — ABNORMAL HIGH (ref 8–23)
CO2: 19 mmol/L — ABNORMAL LOW (ref 22–32)
Calcium: 7.5 mg/dL — ABNORMAL LOW (ref 8.9–10.3)
Chloride: 104 mmol/L (ref 98–111)
Creatinine, Ser: 3.04 mg/dL — ABNORMAL HIGH (ref 0.61–1.24)
GFR, Estimated: 20 mL/min — ABNORMAL LOW (ref >60)
Glucose, Bld: 323 mg/dL — ABNORMAL HIGH (ref 70–99)
Potassium: 5.9 mmol/L — ABNORMAL HIGH (ref 3.5–5.1)
Sodium: 136 mmol/L (ref 135–145)

## 2022-10-02 LAB — MAGNESIUM: Magnesium: 1.9 mg/dL (ref 1.7–2.4)

## 2022-10-02 LAB — HEPARIN LEVEL (UNFRACTIONATED): Heparin Unfractionated: 0.4 IU/mL (ref 0.30–0.70)

## 2022-10-02 LAB — HEPATITIS B SURFACE ANTIGEN: Hepatitis B Surface Ag: NONREACTIVE

## 2022-10-02 LAB — TROPONIN I (HIGH SENSITIVITY): Troponin I (High Sensitivity): 1844 ng/L (ref <18)

## 2022-10-02 LAB — PHOSPHORUS: Phosphorus: 5.8 mg/dL — ABNORMAL HIGH (ref 2.5–4.6)

## 2022-10-02 MED ORDER — INSULIN ASPART 100 UNIT/ML IV SOLN
10.0000 [IU] | Freq: Once | INTRAVENOUS | Status: AC
  Administered 2022-10-02: 10 [IU] via INTRAVENOUS
  Filled 2022-10-02: qty 0.1

## 2022-10-02 MED ORDER — CALCIUM GLUCONATE-NACL 1-0.675 GM/50ML-% IV SOLN
1.0000 g | Freq: Once | INTRAVENOUS | Status: AC
  Administered 2022-10-02: 1000 mg via INTRAVENOUS
  Filled 2022-10-02: qty 50

## 2022-10-02 MED ORDER — EPINEPHRINE PF 1 MG/ML IJ SOLN
1.0000 mg | Freq: Once | INTRAMUSCULAR | Status: AC
  Administered 2022-10-02: 1 mg via INTRAVENOUS

## 2022-10-02 MED ORDER — VITAL AF 1.2 CAL PO LIQD: 1000.0000 mL | ORAL | Status: DC

## 2022-10-02 MED ORDER — HEPARIN SODIUM (PORCINE) 1000 UNIT/ML DIALYSIS: 1000.0000 [IU] | INTRAMUSCULAR | Status: DC | PRN

## 2022-10-02 MED ORDER — DEXTROSE 50 % IV SOLN
12.5000 g | Freq: Once | INTRAVENOUS | Status: AC
  Administered 2022-10-02: 12.5 g via INTRAVENOUS
  Filled 2022-10-02: qty 50

## 2022-10-02 MED ORDER — PRISMASOL BGK 0/2.5 32-2.5 MEQ/L EC SOLN
Status: DC
  Filled 2022-10-02: qty 5000

## 2022-10-02 MED ORDER — FENTANYL CITRATE PF 50 MCG/ML IJ SOSY
50.0000 ug | PREFILLED_SYRINGE | Freq: Once | INTRAMUSCULAR | Status: AC
  Administered 2022-10-02: 50 ug via INTRAVENOUS

## 2022-10-02 MED ORDER — NALOXONE HCL 4 MG/10ML IJ SOLN
1.0000 mg/h | INTRAVENOUS | Status: DC
  Filled 2022-10-02: qty 10

## 2022-10-02 MED ORDER — SODIUM CHLORIDE 0.9 % IV BOLUS
1000.0000 mL | Freq: Once | INTRAVENOUS | Status: AC
  Administered 2022-10-02: 1000 mL via INTRAVENOUS

## 2022-10-02 MED ORDER — NALOXONE HCL 0.4 MG/ML IJ SOLN: 0.4000 mg | INTRAMUSCULAR | Status: DC | PRN

## 2022-10-02 MED ORDER — STERILE WATER FOR INJECTION IV SOLN
INTRAVENOUS | Status: DC
  Filled 2022-10-02: qty 150

## 2022-10-02 MED ORDER — NALOXONE HCL 0.4 MG/ML IJ SOLN
INTRAMUSCULAR | Status: AC
  Filled 2022-10-02: qty 1

## 2022-10-02 MED ORDER — RENA-VITE PO TABS: 1.0000 | ORAL_TABLET | Freq: Every day | ORAL | Status: DC

## 2022-10-02 MED ORDER — SODIUM CHLORIDE 0.9 % IV SOLN
500.0000 mg | INTRAVENOUS | Status: DC
  Administered 2022-10-02: 500 mg via INTRAVENOUS
  Filled 2022-10-02: qty 5

## 2022-10-02 MED ORDER — FENTANYL CITRATE PF 50 MCG/ML IJ SOSY
PREFILLED_SYRINGE | INTRAMUSCULAR | Status: AC
  Filled 2022-10-02: qty 1

## 2022-10-02 MED ORDER — FENTANYL CITRATE PF 50 MCG/ML IJ SOSY
25.0000 ug | PREFILLED_SYRINGE | Freq: Once | INTRAMUSCULAR | Status: AC
  Administered 2022-10-02: 25 ug via INTRAVENOUS

## 2022-10-02 MED ORDER — EPINEPHRINE 1 MG/10ML IJ SOSY
PREFILLED_SYRINGE | INTRAMUSCULAR | Status: AC
  Filled 2022-10-02: qty 20

## 2022-10-02 MED ORDER — NALOXONE HCL 0.4 MG/ML IJ SOLN
0.1000 mg | INTRAMUSCULAR | Status: DC | PRN
  Administered 2022-10-02: 0.1 mg via INTRAVENOUS

## 2022-10-02 MED ORDER — PRISMASOL BGK 0/2.5 32-2.5 MEQ/L EC SOLN
Status: DC
  Filled 2022-10-02 (×4): qty 5000

## 2022-10-02 MED ORDER — SODIUM BICARBONATE 8.4 % IV SOLN
50.0000 meq | Freq: Once | INTRAVENOUS | Status: AC
  Administered 2022-10-02: 50 meq via INTRAVENOUS
  Filled 2022-10-02: qty 50

## 2022-10-02 MED ORDER — EPINEPHRINE HCL 5 MG/250ML IV SOLN IN NS
0.5000 ug/min | INTRAVENOUS | Status: DC
  Filled 2022-10-02: qty 250

## 2022-10-02 MED ORDER — PHENYLEPHRINE HCL-NACL 20-0.9 MG/250ML-% IV SOLN
0.0000 ug/min | INTRAVENOUS | Status: DC
  Administered 2022-10-02: 100 ug/min via INTRAVENOUS
  Filled 2022-10-02: qty 250

## 2022-10-02 MED ORDER — NALOXONE HCL 0.4 MG/ML IJ SOLN
0.2000 mg | INTRAMUSCULAR | Status: DC | PRN
  Administered 2022-10-02: 0.2 mg via INTRAVENOUS

## 2022-10-02 MED FILL — Medication: Qty: 1 | Status: AC

## 2022-10-03 LAB — HEPATITIS B E ANTIBODY: Hep B E Ab: NEGATIVE

## 2022-10-03 LAB — BLOOD GAS, VENOUS
Acid-base deficit: 10.4 mmol/L — ABNORMAL HIGH (ref 0.0–2.0)
Acid-base deficit: 4.6 mmol/L — ABNORMAL HIGH (ref 0.0–2.0)
Bicarbonate: 17.6 mmol/L — ABNORMAL LOW (ref 20.0–28.0)
Bicarbonate: 22.1 mmol/L (ref 20.0–28.0)
O2 Saturation: 70.5 %
O2 Saturation: 73.4 %
Patient temperature: 37
Patient temperature: 37
pCO2, Ven: 46 mmHg (ref 44–60)
pCO2, Ven: 46 mmHg (ref 44–60)
pH, Ven: 7.19 — CL (ref 7.25–7.43)
pH, Ven: 7.29 (ref 7.25–7.43)
pO2, Ven: 40 mmHg (ref 32–45)
pO2, Ven: 44 mmHg (ref 32–45)

## 2022-10-04 LAB — HEPATITIS B SURFACE ANTIBODY, QUANTITATIVE: Hep B S AB Quant (Post): <3.1 m[IU]/mL — ABNORMAL LOW (ref >9.9)

## 2022-10-06 LAB — CULTURE, RESPIRATORY W GRAM STAIN

## 2022-10-17 NOTE — Progress Notes (Addendum)
Nutrition Follow-up  DOCUMENTATION CODES:   Non-severe (moderate) malnutrition in context of chronic illness  INTERVENTION:   Vital AF 1.2@70ml$ /hr- Initiate at 30m/hr and increase by 174mhr q 8 hrs until goal rate is reached.   Free water flushes 3072m4 hours to maintain tube patency   Regimen provides 2016kcal/day, 126g/day protein and 1542m1my of free water.   Rena-vit daily via tube   Pt at high refeed risk; recommend monitor potassium, magnesium and phosphorus labs daily until stable  Daily weights   NUTRITION DIAGNOSIS:   Moderate Malnutrition related to chronic illness (COPD) as evidenced by mild fat depletion, moderate fat depletion, moderate muscle depletion, severe muscle depletion. -ongoing   GOAL:   Provide needs based on ASPEN/SCCM guidelines -previously met with tube feeds  MONITOR:   Vent status, Labs, Weight trends, TF tolerance, I & O's, Skin  ASSESSMENT:   76 y55 male with h/o CAD s/p CABG x 4, esophageal dysphagia, HTN, HLD, DM, CKD, CHF, COPD, COVID (10/23) and thrombocytopenia who is admitted with PEA arrest, AKI and encephalopathy.  Pt remains sedated and ventilated. OGT in place. Tube feeds were initiated yesterday but were stopped this morning per MD in preparation for HD cath. Plan is to restart tube feeds this afternoon. CRRT to be initiated today. No ABI found; pt with encephalopathy. No BM noted since admission. Per chart, pt remains weight stable since admission. Pt +4.8L on is I & Os.    Medications reviewed and include: pepcid, insulin, lokelma, azithromycin, levophed, vasopressin   Labs reviewed: K 4.8 wnl, BUN 82(H), creat 3.09(H), P 6.0(H), mg 1.9 wnl BNP 520.9(H)- 2/13 Hgb 9.4(L), Hct 27.4(L) Cbgs- 248, 252 x 24 hrs  AIC 6.4(H)- 12/24  Patient is currently intubated on ventilator support MV: 11.5 L/min Temp (24hrs), Avg:99.8 F (37.7 C), Min:98.2 F (36.8 C), Max:100.6 F (38.1 C)  Propofol: none   MAP- >65mm14m UOP-  1700ml 27met Order:   Diet Order             Diet NPO time specified  Diet effective now                  EDUCATION NEEDS:   Not appropriate for education at this time  Skin:  Skin Assessment: Reviewed RN Assessment  Last BM:  Unknown  Height:   Ht Readings from Last 1 Encounters:  09/18/22 6' 1"$  (1.854 m)    Weight:   Wt Readings from Last 1 Encounters:  10/13/2022-02-24    Ideal Body Weight:  83.6 kg  BMI:  Body mass index is 22.11 kg/m.  Estimated Nutritional Needs:   Kcal:  1990kcal/day  Protein:  115-130g/day  Fluid:  1.9-2.2L/day  Jaleigh Mccroskey Koleen DistanceD, LDN Please refer to [facility] and/or RD on-call/weekend/after hours pager

## 2022-10-17 NOTE — Progress Notes (Signed)
   10/14/22 1400  Spiritual Encounters  Type of Visit Initial  Care provided to: Pt and family  Conversation partners present during encounter Nurse  Referral source Nurse (RN/NT/LPN)  Reason for visit End-of-life  OnCall Visit Yes  Spiritual Framework  Presenting Themes Rituals and practive;Impactful experiences and emotions;Significant life change  Family Stress Factors Loss;Major life changes  Interventions  Spiritual Care Interventions Made Compassionate presence;Established relationship of care and support;Normalization of emotions;Prayer;Mindfulness intervention  Intervention Outcomes  Outcomes Connection to values and goals of care;Connection to spiritual care;Reduced isolation;Connected to [place] Issues Still Outstanding No further spiritual care needs at this time (see row info)   Chap responded to E.O.L case/call from the Nurse. Family present and grieving ongoing transition to afterlife. Offered a compassionate presence as well as a prayer of committal at the request by the family.

## 2022-10-17 NOTE — Progress Notes (Signed)
[facility]  ROUNDING NOTE   Subjective:   [name] was admitted to [facility] on 10/05/2022 for Cardiac arrest ([place]) [I46.9]  Tmax 100.6  UOP 1700  Norepinephrine and vasopressin gtt   Bicarb infusion discontinued due to volume overload.   Objective:  Vital signs in last 24 hours:  Temp:  [98.2 F (36.8 C)-100.6 F (38.1 C)] 100.2 F (37.9 C) Oct 11, 2022 1130) Pulse Rate:  [92-135] 99 11-Oct-2022 1130) Resp:  [0-97] 27 10-11-22 1130) BP: (57-131)/(47-85) 102/83 10/11/2022 1130) SpO2:  [100 %] 100 % October 11, 2022 1130) FiO2 (%):  [30 %-40 %] 30 % 10-11-2022 1100) Weight:  [76 kg] 76 kg 10/11/22 0332)  Weight change: 0.3 kg Filed Weights   09/21/2022 1337 10/01/22 0335 2022/10/11 0332  Weight: 75.7 kg 75.8 kg 76 kg    Intake/Output: I/O last 3 completed shifts: In: 39.9 [I.V.:3788; NG/GT:940.3; IV Piggyback:348.6] Out: 2250 [Urine:2250]   Intake/Output this shift:  Total I/O In: 667.9 [I.V.:456.6; NG/GT:173.3; IV Piggyback:38] Out: 150 [Urine:150]  Physical Exam: General: Critically ill  Head: ETT  Eyes: Anicteric   Neck: trachea midline  Lungs:  PRVC FiO2 30%  Heart: Ventricularly paced  Abdomen:  Soft, nontender  Extremities:  no peripheral edema.  Neurologic: Intubated, sedated  Skin: No lesions  Access: none    Basic Metabolic Panel: Recent Labs  Lab 10/10/2022 1606 09/29/2022 1612 09/27/2022 1938 10/01/22 0336 10/01/22 1111 10/01/22 1604 10/01/22 2023 2022/10/11 0340 10/11/22 0952  NA  --  139  --  143 140  --   --  136 137  K  --  6.0*   < > 5.7* 5.9*  --  5.5* 5.9* 4.8  CL  --  114*  --  114* 111  --   --  104 103  CO2  --  13*  --  21* 22  --   --  19* 23  GLUCOSE  --  309*  --  40* 83  --   --  323* 278*  BUN  --  94*  --  89* 80*  --   --  84* 82*  CREATININE  --  3.27*  --  3.00* 2.73*  --   --  3.04* 3.09*  CALCIUM  --  8.7*  --  8.4* 8.0*  --   --  7.5* 7.6*  MG 2.6*  --   --  2.2  --  1.9  --  1.9  --   PHOS 6.0*  --   --  3.8  --  3.7  --   5.8* 6.0*   < > = values in this interval not displayed.     Liver Function Tests: Recent Labs  Lab 10/05/2022 1327 10/01/22 0336 10/11/2022 0340 2022/10/11 0952  AST 167* 195* 85*  --   ALT 210* 286* 177*  --   ALKPHOS 67 71 68  --   BILITOT 0.9 1.2 1.9*  --   PROT 6.4* 6.2* 5.8*  --   ALBUMIN 3.6 3.4* 3.1* 3.0*    No results for input(s): "LIPASE", "AMYLASE" in the last 168 hours. No results for input(s): "AMMONIA" in the last 168 hours.  CBC: Recent Labs  Lab 09/23/2022 1327 10/08/2022 1612 10/01/22 0336 11-Oct-2022 0340  WBC 6.1 7.6 7.5 7.1  NEUTROABS 3.8  --  6.5  --   HGB 10.6* 10.9* 10.1* 9.4*  HCT 31.8* 32.2* 28.7* 27.4*  MCV 100.3* 97.9 94.7 96.5  PLT 87* 85* 86* 56*  Cardiac Enzymes: No results for input(s): "CKTOTAL", "CKMB", "CKMBINDEX", "TROPONINI" in the last 168 hours.  BNP: Invalid input(s): "POCBNP"  CBG: Recent Labs  Lab 10/01/22 1930 10/01/22 2334 2022-10-11 0316 10-11-2022 0729 10/11/2022 1119  GLUCAP 108* 154* 252* 248* 207*     Microbiology: Results for orders placed or performed during the hospital encounter of 09/20/2022  Resp panel by RT-PCR (RSV, Flu A&B, Covid) Anterior Nasal Swab     Status: None   Collection Time: 09/18/2022  2:10 PM   Specimen: Anterior Nasal Swab  Result Value Ref Range Status   SARS Coronavirus 2 by RT PCR NEGATIVE NEGATIVE Final    Comment: (NOTE) SARS-CoV-2 target nucleic acids are NOT DETECTED.  The SARS-CoV-2 RNA is generally detectable in upper respiratory specimens during the acute phase of infection. The lowest concentration of SARS-CoV-2 viral copies this assay can detect is 138 copies/mL. A negative result does not preclude SARS-Cov-2 infection and should not be used as the sole basis for treatment or other patient management decisions. A negative result may occur with  improper specimen collection/handling, submission of specimen other than nasopharyngeal swab, presence of viral mutation(s) within  the areas targeted by this assay, and inadequate number of viral copies(<138 copies/mL). A negative result must be combined with clinical observations, patient history, and epidemiological information. The expected result is Negative.  Fact Sheet for Patients:  EntrepreneurPulse.com.au  Fact Sheet for Healthcare Providers:  IncredibleEmployment.be  This test is no t yet approved or cleared by the [place] FDA and  has been authorized for detection and/or diagnosis of SARS-CoV-2 by FDA under an Emergency Use Authorization (EUA). This EUA will remain  in effect (meaning this test can be used) for the duration of the COVID-19 declaration under Section 564(b)(1) of the Act, 21 U.S.C.section 360bbb-3(b)(1), unless the authorization is terminated  or revoked sooner.       Influenza A by PCR NEGATIVE NEGATIVE Final   Influenza B by PCR NEGATIVE NEGATIVE Final    Comment: (NOTE) The Xpert Xpress SARS-CoV-2/FLU/RSV plus assay is intended as an aid in the diagnosis of influenza from Nasopharyngeal swab specimens and should not be used as a sole basis for treatment. Nasal washings and aspirates are unacceptable for Xpert Xpress SARS-CoV-2/FLU/RSV testing.  Fact Sheet for Patients: EntrepreneurPulse.com.au  Fact Sheet for Healthcare Providers: IncredibleEmployment.be  This test is not yet approved or cleared by the [place] FDA and has been authorized for detection and/or diagnosis of SARS-CoV-2 by FDA under an Emergency Use Authorization (EUA). This EUA will remain in effect (meaning this test can be used) for the duration of the COVID-19 declaration under Section 564(b)(1) of the Act, 21 U.S.C. section 360bbb-3(b)(1), unless the authorization is terminated or revoked.     Resp Syncytial Virus by PCR NEGATIVE NEGATIVE Final    Comment: (NOTE) Fact Sheet for  Patients: EntrepreneurPulse.com.au  Fact Sheet for Healthcare Providers: IncredibleEmployment.be  This test is not yet approved or cleared by the [place] FDA and has been authorized for detection and/or diagnosis of SARS-CoV-2 by FDA under an Emergency Use Authorization (EUA). This EUA will remain in effect (meaning this test can be used) for the duration of the COVID-19 declaration under Section 564(b)(1) of the Act, 21 U.S.C. section 360bbb-3(b)(1), unless the authorization is terminated or revoked.  Performed at [facility], [address], [place], [place] [address]   MRSA Next Gen by PCR, Nasal     Status: None   Collection Time: 10/13/2022  4:33  PM   Specimen: Nasal Mucosa; Nasal Swab  Result Value Ref Range Status   MRSA by PCR Next Gen NOT DETECTED NOT DETECTED Final    Comment: (NOTE) The GeneXpert MRSA Assay (FDA approved for NASAL specimens only), is one component of a comprehensive MRSA colonization surveillance program. It is not intended to diagnose MRSA infection nor to guide or monitor treatment for MRSA infections. Test performance is not FDA approved in patients less than 13 years old. Performed at [facility], [address] [place], [place] [address]     Coagulation Studies: Recent Labs    09/29/2022 1327 10/01/22 0336  LABPROT 16.4* 16.8*  INR 1.3* 1.4*     Urinalysis: No results for input(s): "COLORURINE", "LABSPEC", "PHURINE", "GLUCOSEU", "HGBUR", "BILIRUBINUR", "KETONESUR", "PROTEINUR", "UROBILINOGEN", "NITRITE", "LEUKOCYTESUR" in the last 72 hours.  Invalid input(s): "APPERANCEUR"    Imaging: DG Chest Port 1 View  Result Date: 10/01/2022 CLINICAL DATA:  Pulmonary edema EXAM: PORTABLE CHEST 1 VIEW COMPARISON:  10/01/2022 FINDINGS: Stable ET tube, enteric tube, left IJ catheter. Left upper chest defibrillator with leads along the right side of the heart. Presumed contrast along the  stomach at the edge of the imaging field. Enlarged heart with small left effusion and adjacent opacities. The effusion is decreasing. Persistent right lung base opacity. Vascular congestion. No pneumothorax. Overlapping cardiac leads. IMPRESSION: 1. Slight decrease in left effusion and opacity. Significant residual Electronically Signed   By: Jill Side M.D.   On: 10/01/2022 11:11   DG Chest 1 View  Result Date: 09/24/2022 CLINICAL DATA:  Central venous catheter placement EXAM: CHEST  1 VIEW COMPARISON:  10/05/2022 FINDINGS: Endotracheal tube is seen 3.8 cm above the carina. Nasogastric tube extends into the upper abdomen beyond the margin of the examination. Left internal jugular central venous catheter is been placed with its tip overlying the expected superior vena cava. The lungs are symmetrically well expanded. There is progressive interstitial pulmonary infiltrate most in keeping with pulmonary edema, asymmetrically more severe within the right perihilar region. No pneumothorax or pleural effusion. Coronary artery bypass grafting has been performed. Cardiac size is mildly enlarged. Left subclavian pacemaker defibrillator is unchanged. IMPRESSION: 1. Support tubes in appropriate position. Left internal jugular central venous catheter in appropriate position. No pneumothorax. 2. Progressive pulmonary edema. Electronically Signed   By: Fidela Salisbury M.D.   On: 09/26/2022 22:28   CT HEAD WO CONTRAST (5MM)  Result Date: 10/04/2022 CLINICAL DATA:  Mental status change, unknown cause. EXAM: CT HEAD WITHOUT CONTRAST CT CERVICAL SPINE WITHOUT CONTRAST TECHNIQUE: Multidetector CT imaging of the head and cervical spine was performed following the standard protocol without intravenous contrast. Multiplanar CT image reconstructions of the cervical spine were also generated. RADIATION DOSE REDUCTION: This exam was performed according to the departmental dose-optimization program which includes automated exposure  control, adjustment of the mA and/or kV according to patient size and/or use of iterative reconstruction technique. COMPARISON:  None Available. FINDINGS: CT HEAD FINDINGS Brain: No acute hemorrhage, mass effect or midline shift. Gray-white differentiation is preserved. No hydrocephalus. No extra-axial collection. Basilar cisterns are patent. Vascular: Extensive intracranial atherosclerosis. Skull: No calvarial fracture or suspicious bone lesion. Skull base is unremarkable. Sinuses/Orbits: Layering fluid in the sinuses and nasopharynx, likely secondary to endotracheal intubation. Other: None. CT CERVICAL SPINE FINDINGS Alignment: Normal. Skull base and vertebrae: Diffusely demineralized appearance of the bones. No acute fracture. Normal craniocervical junction. Mild degenerative changes of the C1-2 articulation. Soft tissues and spinal canal: No prevertebral fluid or  swelling. No visible canal hematoma. Disc levels: Mild cervical spondylosis without high-grade spinal canal stenosis. Upper chest: Interlobular septal thickening, possibly due to pulmonary edema. Other: Atherosclerotic calcifications of the carotid bulbs. IMPRESSION: CT HEAD: No acute intracranial abnormality. CT CERVICAL SPINE: 1. No acute fracture or traumatic listhesis of the cervical spine. 2. Interlobular septal thickening in the lung apices, possibly due to pulmonary edema. Electronically Signed   By: Emmit Alexanders M.D.   On: 09/21/2022 15:33   CT CERVICAL SPINE WO CONTRAST  Result Date: 10/01/2022 CLINICAL DATA:  Mental status change, unknown cause. EXAM: CT HEAD WITHOUT CONTRAST CT CERVICAL SPINE WITHOUT CONTRAST TECHNIQUE: Multidetector CT imaging of the head and cervical spine was performed following the standard protocol without intravenous contrast. Multiplanar CT image reconstructions of the cervical spine were also generated. RADIATION DOSE REDUCTION: This exam was performed according to the departmental dose-optimization program  which includes automated exposure control, adjustment of the mA and/or kV according to patient size and/or use of iterative reconstruction technique. COMPARISON:  None Available. FINDINGS: CT HEAD FINDINGS Brain: No acute hemorrhage, mass effect or midline shift. Gray-white differentiation is preserved. No hydrocephalus. No extra-axial collection. Basilar cisterns are patent. Vascular: Extensive intracranial atherosclerosis. Skull: No calvarial fracture or suspicious bone lesion. Skull base is unremarkable. Sinuses/Orbits: Layering fluid in the sinuses and nasopharynx, likely secondary to endotracheal intubation. Other: None. CT CERVICAL SPINE FINDINGS Alignment: Normal. Skull base and vertebrae: Diffusely demineralized appearance of the bones. No acute fracture. Normal craniocervical junction. Mild degenerative changes of the C1-2 articulation. Soft tissues and spinal canal: No prevertebral fluid or swelling. No visible canal hematoma. Disc levels: Mild cervical spondylosis without high-grade spinal canal stenosis. Upper chest: Interlobular septal thickening, possibly due to pulmonary edema. Other: Atherosclerotic calcifications of the carotid bulbs. IMPRESSION: CT HEAD: No acute intracranial abnormality. CT CERVICAL SPINE: 1. No acute fracture or traumatic listhesis of the cervical spine. 2. Interlobular septal thickening in the lung apices, possibly due to pulmonary edema. Electronically Signed   By: Emmit Alexanders M.D.   On: 10/15/2022 15:33   CT LUMBAR SPINE WO CONTRAST  Result Date: 10/15/2022 CLINICAL DATA:  Witnessed arrest, back pain EXAM: CT LUMBAR SPINE WITHOUT CONTRAST TECHNIQUE: Multidetector CT imaging of the lumbar spine was performed without intravenous contrast administration. Multiplanar CT image reconstructions were also generated. RADIATION DOSE REDUCTION: This exam was performed according to the departmental dose-optimization program which includes automated exposure control, adjustment of  the mA and/or kV according to patient size and/or use of iterative reconstruction technique. COMPARISON:  None Available. FINDINGS: Segmentation: Lumbar type vertebral bodies. Alignment: Normal lumbar lordosis. Vertebrae: No acute fracture or focal pathologic process. Paraspinal and other soft tissues: Cardiac ICD leads, incompletely visualized. Small loculated left pleural effusion, decreased from prior CT chest. Prior cholecystectomy. Extensive vascular calcifications. Disc levels: Mild multilevel degenerative changes, most prominent at L4-5 and L5-S1. Spinal canal is patent. IMPRESSION: No traumatic injury to the lumbar spine. Mild multilevel degenerative changes. Electronically Signed   By: Julian Hy M.D.   On: 10/05/2022 15:30   DG Chest 1 View  Result Date: 10/10/2022 CLINICAL DATA:  ETT and OG tube placement EXAM: CHEST  1 VIEW COMPARISON:  Chest x-ray dated August 08, 2022 FINDINGS: Cardiac and mediastinal contours unchanged status post median sternotomy. Left chest wall pacer unchanged lead position. ETT tip is approximally 5 cm from the carina. OG tube partially visualized coursing below the diaphragm. Mild heterogeneous opacities, likely due to pulmonary edema. Small left pleural effusion. No  evidence of pneumothorax IMPRESSION: 1. ETT tip is 5 cm from the carina. 2. OG tube partially visualized coursing below the diaphragm. 3. Mild heterogeneous opacities, likely due to pulmonary edema. 4. Small left pleural effusion. Electronically Signed   By: Yetta Glassman M.D.   On: 10/15/2022 14:47   DG Abdomen 1 View  Result Date: 10/15/2022 CLINICAL DATA:  OG tube placement EXAM: ABDOMEN - 1 VIEW COMPARISON:  02/11/2020. FINDINGS: OG tube tip is in the fundus of the stomach. Nonobstructive bowel gas pattern. IMPRESSION: OG tube in the stomach. Electronically Signed   By: Rolm Baptise M.D.   On: 10/15/2022 14:44     Medications:    sodium chloride 10 mL/hr at October 12, 2022 0840   amiodarone 30  mg/hr (2022/10/12 0840)   azithromycin 500 mg (10-12-2022 1136)   feeding supplement (VITAL AF 1.2 CAL)     norepinephrine (LEVOPHED) Adult infusion 7 mcg/min (10/12/22 0840)   prismasol BGK 2/2.5 dialysis solution     prismasol BGK 2/2.5 replacement solution     prismasol BGK 2/2.5 replacement solution     vasopressin 0.04 Units/min (2022-10-12 0840)    Chlorhexidine Gluconate Cloth  6 each Topical Daily   famotidine  20 mg Per Tube Daily   free water  30 mL Per Tube Q4H   insulin aspart  0-9 Units Subcutaneous Q4H   multivitamin  1 tablet Per Tube QHS   mouth rinse  15 mL Mouth Rinse Q2H   sodium zirconium cyclosilicate  10 g Per Tube Q8H   acetaminophen, docusate sodium, heparin, midazolam, mouth rinse, polyethylene glycol  Assessment/ Plan:  Rick Zuniga is a 77 y.o. white male with COPD, insulin dependent diabetes mellitus type II, hypertension, coronary artery disease, chronic systolic congestive heart failure, thrombocytopenia who is admitted to [facility] ICU on 10/03/2022 for Cardiac arrest [facility]) [I46.9]  Acute kidney injury on chronic kidney disease stage IV: baseline creatinine of 3.37, GFR of 29 on 08/13/22. Acute kidney injury complicated by hyperkalemia and metabolic acidosis. Acute kidney injury secondary to hypotension from cardiogenic shock. Nonoliguric urine output. Chronic kidney disease secondary to diabetes and hypertension. Patient with history of proteinuria. No acute indication for dialysis.  Due to volume overload, will initiate CVVHDF. Orders prepared.  Discontinued sodium bicarb gtt.  Discontinue Lokelma.  Holding Entresto and PO potassium chloride.   Acute Respiratory Failure: requiring intubation and mechanical ventilation. History of recurrent left pleural effusion.   Cardiogenic shock: status post PEA arrest. Currently with hypotension and requiring norepinephrine gtt and vasopressin.   Anemia with chronic kidney disease: hemoglobin 9.4, platelets are  trending down.   Diabetes mellitus type II with chronic kidney disease: insulin dependent. Hemoglobin A1c of 6.4% on 08/08/22. Continue glucose control.    LOS: 2 Carling Liberman 10-12-2409:49 AM

## 2022-10-17 NOTE — Procedures (Signed)
Arterial Line Placement: Indication: Frequent blood draws; Invasive BP monitoring.   Consent: From Son in chart  Hand washing performed prior to starting the procedure.   Procedure: An active timeout was performed and correct patient, name, & ID confirmed. Physicial exam was performed to ensure adequate perfusion.  Using sterile technique, an aterial line was inserted into the right Femoral artery.  Catheter threaded and the needle was removed with appropriate blood return.  Arterial waveform was noted.  After the procedure, the patient's extremities were observed to be pink and warm.   Estimated Blood Loss: None .   Number of Attempts: 1.   Complications: None .      Ottie Glazier, M.D.  Pulmonary & [place]

## 2022-10-17 NOTE — Progress Notes (Signed)
1200- Pt's BP 76/21 with MAP of 60, Dr. Lanney Gins aware and at bedside. 1215- Pt's BP 73/51 with MAP of 59, levo titrated to 72mg. Dr. ALanney Ginsat bedside preparing for trialysis placement. 1230- Pt's BP 87/59 with MAP of 69. Dr. ALanney Ginsplacing left femoral trialysis. Pt is waking up and moving around. Orders received to administer 562m of fentanyl at this time. Fetanyl given. Pt resting well during line placement. 1245- Pt's BP 63/48 with MAP of 53. Dr. AlLanney Ginstill at bedside. Levo titrated to 1042m 1250- Pt's BP 137/95 with MAP of 107, levo at 35m46mTrialysis complete and Dr. AlesLanney Ginscing right femoral aline. Pt moving around again and orders received to administer 25mc73m fentanyl. Fentanyl given. Pt resting well. 1300- Pt's BP 54/26 with MAP of 35. Levo titrated to 20mcg18m05- [place] 47/31 with MAP of 37. Levo turned up to 50mcg 21mDr. AleskerLanney Gins BP per aline is 88/46 with MAP of 57. 1315- BP per aline is 89/49 with MAP of 60. Dr. AleskerLanney Ginsed with procedures.  1325- D90leskerLanney Gins to bedside for drop in BP again. Aline BP 40-34. Neo gtt ordered and pharmacy called. Levo titrated to 60mcg p52mr. AleskeroLanney Ginsof n59man ordered and given per Dr. AleskerovLanney GinsPt responded to Narcan and began opening eyes and BP slightly improved. 1335- 0.2 mg of Narcan ordered and administered per Dr. AleskerovLanney Ginsonse at this time. Narcan infusion ordered. Pharmacy contacted. BP 79/46 with MAP of 56. 1344- Neo gtt started. Aline BP 28/20. Epi gtt ordered. Pharmacy contacted. HR 68, pacemaker. 1345- Dr.79skerovLanney Ginsaiting room to talk to family and let them know about pt's rapid decline. Pt is DNR. 1350- 1mg of ep70miven per Dr. Aleskerov.Lanney Ginsg of epi2mven per Dr. Aleskerov. Lanney Gins. A91erov bLanney Gins's family to bedside. They are still agreeable to DNR and understand he is passing. Pt is maxed on vaso, levo, and neo at this time.  Waiting for epi and narcan gtt from pharmacy. 1400- Time of death called. Family at beside and are aware.

## 2022-10-17 NOTE — Progress Notes (Signed)
Labs this morning reveal worsening renal function and NAGMA as well as hyperkalemia and hyperglycemia - shifting measures ordered: insulin & 1/2 an amp - sodium bicarb supplementation, continue lokelma - nephrology following, appreciate input   Domingo Pulse [place], AGACNP-BC Acute Care Nurse Practitioner [facility]   [phone] / [phone] Please see Amion for pager details.

## 2022-10-17 NOTE — Progress Notes (Signed)
NAME:  Rick Zuniga, MRN:  [id number], DOB:  1946-01-02, LOS: 2 ADMISSION DATE:  09/19/2022, CONSULTATION DATE: 09/19/2022 REFERRING MD: Dr. Cheri Fowler, CHIEF COMPLAINT: Cardiac Arrest    History of Present Illness:  This is a 77 yo male who presented to [facility] ER on 02/13 post witnessed cardiac arrest.  According to pts significant other the pt c/o bilateral shoulder and upper back pain radiating to the left leg on 02/12.  On 02/13 the pt stated he felt better and wanted to go to [place] to eat.  When they arrived at the restaurant he had a witnessed cardiac arrest, bystander CPR initiated.  Estimated downtime prior to EMS arrival was 8 minutes.  EMS arrived on the scene pt in v-fib requiring defibrillation x1.  He subsequently went into PEA requiring 3 rounds of CPR prior to ROSC.     Oct 07, 2022- patient continued to require vasopressor support.  We attempted to escalate therapy with RRT but patient continued to have arrythmias and eventually lost pulse.  Family was notified in person and was at bedside to see patient after passing.    ER Course Upon arrival to the ER pt unresponsive.  He PEA arrested again, ACLS protocol initiated with ROSC achieved 3 minutes following initiation.  Pt mechanically intubated post cardiac arrest.  Levophed gtt started for hypotension.  Pt also received amiodarone bolus followed by amiodarone gtt.  Cardiology evaluated pt and agreed with medical management.  ABG revealed severe metabolic acidosis: pH [id number] 37/pO2 351/acid-base deficit 17.8/bicarb 11.2.  PCCM team contacted for ICU admission.  Medications received: 1g calcium/75 mg rocuronium/fentanyl gtt @50$  mcg/hr/2 amps of sodium bicarb/amiodarone bolus followed by amiodarone gtt  Initial EKG: Ventricular paced, heart rate 116, no evidence of acute ischemia  Significant lab results: K+ 5.7/chloride 113/CO2 13/glucose 291/BUN 94/creatinine 3.37/calcium 8.0/AST 167/ALT 210/hgb 10.6/platelets 87/PT 16.4/INR  1.3.  COVID-19/Influenza A&B negative  CXR: ETT tip is 5 cm from the carina. OG tube partially visualized coursing below the diaphragm. Mild heterogeneous opacities, likely due to pulmonary edema.  Small left pleural effusion. KUB: OG tube tip is in the fundus of the stomach. Nonobstructive bowel gas pattern.  CT Head/Cervical Spine: No acute intracranial abnormality. No acute fracture or traumatic listhesis of the cervical spine. Interlobular septal thickening in the lung apices, possibly due to pulmonary edema. CT Lumbar Spine: No traumatic injury to the lumbar spine. Mild multilevel degenerative changes.   10/01/22- patient remains critically ill, he is on PRVC weaned to 40% more rhonchorous today.  Blood gas improved overnight and urine output has increased despite AKI.  We were unable to get MRI brain today due to PPM.  Considering EEG today , stopped sedation for neuroevaluation.   Pertinent  Medical History  AICD Permanent Cardiac Pacemaker  Arthritis  Cataract  CHF  COPD COVID-19 (05/23/2022) Uncontrolled Type II Diabetes Mellitus  HTN  Hypertensive Retinopathy  Indigestion Thrombocytopenia  Recurrent Pleural Effusion DVT Aortic Atherosclerosis  CAD  HLD  Chronic Systolic Diastolic CHF : Echo [id number] <20% CABG x4 Stage IV CKD   Significant Hospital Events: Including procedures, antibiotic start and stop dates in addition to other pertinent events   02/13: Pt admitted post cardiac arrest mechanically intubated artic sun initiated    Interim History / Subjective:  Pt unresponsive mechanically intubated requiring levophed gtt @ 10mg/min and amiodarone gtt infusing at 60 mg/hr   Objective   Blood pressure 130/79, pulse 96, temperature 99.9 F (37.7 C), resp. rate 17, weight 76 kg, SpO2 100 %.  CVP:  [0 mmHg-17 mmHg] 12 mmHg  Vent Mode: PRVC FiO2 (%):  [30 %-40 %] 30 % Set Rate:  [18 bmp] 18 bmp Vt Set:  [550 mL] 550 mL PEEP:  [5 cmH20] 5 cmH20 Plateau Pressure:   [22 cmH20-24 cmH20] 22 cmH20   Intake/Output Summary (Last 24 hours) at 10-09-22 0827 Last data filed at 09-Oct-2022 0500 Gross per 24 hour  Intake 2694.81 ml  Output 1580 ml  Net 1114.81 ml    Filed Weights   09/19/2022 1337 10/01/22 0335 Oct 09, 2022 0332  Weight: 75.7 kg 75.8 kg 76 kg   Examination: General: Acute on chronically-ill appearing male, NAD mechanically intubated  HENT: Supple, no JVD  Lungs: Diminished throughout, even, non labored  Cardiovascular: Ventricular-paced, no m/r/g, 2+ radial/1+ distal pulses, no edema  Abdomen: +BS x4, soft, non distended Extremities: Normal bulk and tone Neuro: Sedated, not following commands or withdrawing from painful stimulation, right pupil unequal and non reactive/left pupil 2 mm reactive  GU: Indwelling foley catheter draining clear yellow urine   Resolved Hospital Problem list    Assessment & Plan:   Mechanical intubation post cardiac arrest  Hx: COPD and chronic pleural effusion - Full vent support for now: vent settings reviewed and established - Continue lung protective strategies  - SBT once all parameters met  - Prn bronchodilator therapy  - Continue normothermia protocol - Maintain RASS goal of -4 during normothermia protocol  - PAD protocol to maintain RASS goal: Fentanyl and propofol gtts  Cardiac arrest (cardiac rhythm: PEA and v-fib)  Cardiogenic shock  Acute 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  Elevated troponin post cardiac arrest  Hx: AICD, permanent pacemaker, CAD s/p CABG, and HTN   Echo 08/10/22: EF <20% with Grade III diastolic dysfunction (restrictive) and right ventricular systolic function mildly reduced  - Continuous telemetry monitoring  - Cautious iv fluid resuscitation and prn levophed gtt to maintain map >65 - Hold outpatient diuretics, beta-blockers, and anti-hypertensive medications for now   - Trend troponin's until peaked  -  Cardiology consulted appreciate input: continue amiodarone gtt   Acute kidney injury on stage IV CKD with hyperkalemia and severe metabolic acidosis secondary to ATN and concerning for cardiorenal syndrome  - Trend BMP  - Replace electrolytes as indicated  - Monitor UOP  - Nephrology consulted appreciate input   Anemia of of chronic kidney disease  Chronic thrombocytopenia  - Trend CBC  - Monitor for s/sx of bleeding  - Transfuse for hgb <7 and/or platelet count of <50,000 - Monitor for s/sx of bleeding   Shock liver  Chronic thrombocytopenia  - Trend hepatic function panel  - Avoid hepatotoxic medications   Type II diabetes mellitus  - Hemoglobin A1c pending  - CBG's q4hrs - SSI   Acute metabolic encephalopathy post cardiac arrest concerning for possible anoxic injury  - Avoid sedating medications when able  - WUA once normothermia protocol complete - If mentation does not improve in the next 72hrs post normothermia protocol will order MRI Brain and neurology consult   Best Practice (right click and "Reselect all SmartList Selections" daily)   Diet/type: NPO DVT prophylaxis: prophylactic heparin  GI prophylaxis: H2B Lines: N/A Foley:  Yes, and it is still needed Code Status:  full code Last date of multidisciplinary goals of care discussion [N/A]  Will discuss current plan of care, pt prognosis, and code status once daughter and son are at bedside.  Will consult palliative  care to assist with goals of care discussions.   Labs   CBC: Recent Labs  Lab 09/23/2022 1327 09/19/2022 1612 10/01/22 0336 10-30-2022 0340  WBC 6.1 7.6 7.5 7.1  NEUTROABS 3.8  --  6.5  --   HGB 10.6* 10.9* 10.1* 9.4*  HCT 31.8* 32.2* 28.7* 27.4*  MCV 100.3* 97.9 94.7 96.5  PLT 87* 85* 86* 56*     Basic Metabolic Panel: Recent Labs  Lab 10/07/2022 1327 09/19/2022 1606 10/14/2022 1612 10/12/2022 1938 10/01/22 0010 10/01/22 0336 10/01/22 1111 10/01/22 1604 10/01/22 2023 2022/10/30 0340  NA 138   --  139  --   --  143 140  --   --  136  K 5.7*  --  6.0*   < > 5.8* 5.7* 5.9*  --  5.5* 5.9*  CL 113*  --  114*  --   --  114* 111  --   --  104  CO2 13*  --  13*  --   --  21* 22  --   --  19*  GLUCOSE 291*  --  309*  --   --  40* 83  --   --  323*  BUN 94*  --  94*  --   --  89* 80*  --   --  84*  CREATININE 3.37*  --  3.27*  --   --  3.00* 2.73*  --   --  3.04*  CALCIUM 8.0*  --  8.7*  --   --  8.4* 8.0*  --   --  7.5*  MG  --  2.6*  --   --   --  2.2  --  1.9  --  1.9  PHOS  --  6.0*  --   --   --  3.8  --  3.7  --  5.8*   < > = values in this interval not displayed.    GFR: Estimated Creatinine Clearance: 21.9 mL/min (A) (by C-G formula based on SCr of 3.04 mg/dL (H)). Recent Labs  Lab 09/21/2022 1327 10/12/2022 1612 09/22/2022 1938 10/01/22 0010 10/01/22 0336 Oct 30, 2022 0340  PROCALCITON  --  <0.10  --   --  3.15 13.56  WBC 6.1 7.6  --   --  7.5 7.1  LATICACIDVEN  --  2.8* 5.2* 3.1* 2.0*  --      Liver Function Tests: Recent Labs  Lab 10/01/2022 1327 10/01/22 0336 30-Oct-2022 0340  AST 167* 195* 85*  ALT 210* 286* 177*  ALKPHOS 67 71 68  BILITOT 0.9 1.2 1.9*  PROT 6.4* 6.2* 5.8*  ALBUMIN 3.6 3.4* 3.1*    No results for input(s): "LIPASE", "AMYLASE" in the last 168 hours. No results for input(s): "AMMONIA" in the last 168 hours.  ABG    Component Value Date/Time   PHART 7.09 (LL) 09/22/2022 1426   PCO2ART 37 10/04/2022 1426   PO2ART 351 (H) 10/04/2022 1426   HCO3 23.2 10/01/2022 2251   ACIDBASEDEF 2.6 (H) 10/01/2022 2251   O2SAT 65.4 10/01/2022 2251     Coagulation Profile: Recent Labs  Lab 10/05/2022 1327 10/01/22 0336  INR 1.3* 1.4*     Cardiac Enzymes: No results for input(s): "CKTOTAL", "CKMB", "CKMBINDEX", "TROPONINI" in the last 168 hours.  HbA1C: Hgb A1c MFr Bld  Date/Time Value Ref Range Status  08/10/2022 05:44 AM 6.4 (H) 4.8 - 5.6 % Final    Comment:    (NOTE)         Prediabetes: 5.7 -  6.4         Diabetes: >6.4         Glycemic control  for adults with diabetes: <7.0   06/08/2020 11:13 AM 10.9 (H) 4.8 - 5.6 % Final    Comment:    (NOTE)         Prediabetes: 5.7 - 6.4         Diabetes: >6.4         Glycemic control for adults with diabetes: <7.0     CBG: Recent Labs  Lab 10/01/22 1540 10/01/22 1930 10/01/22 2334 10-30-22 0316 Oct 30, 2022 0729  GLUCAP 129* 108* 154* 252* 248*     Review of Systems:   Unable to assess pt mechanically intubated   Past Medical History:  He,  has a past medical history of AICD (automatic cardioverter/defibrillator) present, Arthritis, Cataract, CHF (congestive heart failure) ([place]), Chronic kidney disease, Complication of anesthesia, COPD (chronic obstructive pulmonary disease) ([place]), COVID-19 (05/23/2022), Diabetes ([place]), Diabetic retinopathy ([place]), Heart disease, Hypertension, Hypertensive retinopathy, Indigestion, Presence of permanent cardiac pacemaker, Recurrent pleural effusion on left, Thrombocytopenia ([place]), and Wears dentures.   Surgical History:   Past Surgical History:  Procedure Laterality Date   ANTERIOR VITRECTOMY Right 07/02/2022   Procedure: ANTERIOR VITRECTOMY;  Surgeon: Leandrew Koyanagi, MD;  Location: [facility];  Service: Ophthalmology;  Laterality: Right;   APPENDECTOMY     BI-VENTRICULAR IMPLANTABLE CARDIOVERTER DEFIBRILLATOR  (CRT-D)  05/20/2022   CARDIAC CATHETERIZATION Right 04/16/2022   CARDIAC SURGERY  12/21/2009   bypass. 4 vessel   CATARACT EXTRACTION W/PHACO Left 08/07/2021   Procedure: CATARACT EXTRACTION PHACO AND INTRAOCULAR LENS PLACEMENT (IOC) LEFT DIABETIC 9.06 01:42.2;  Surgeon: Leandrew Koyanagi, MD;  Location: [facility];  Service: Ophthalmology;  Laterality: Left;  Diabetic   CATARACT EXTRACTION W/PHACO Right 07/02/2022   Procedure: CATARACT EXTRACTION PHACO AND INTRAOCULAR LENS PLACEMENT (IOC) RIGHT VISION BLUE HEALON 5 DIABETIC  10.62  01:16.7;  Surgeon: Leandrew Koyanagi, MD;  Location: [facility];   Service: Ophthalmology;  Laterality: Right;   CATARACT EXTRACTION W/PHACO Right 07/16/2022   Procedure: REMOVAL OF LENS FRAMENTS RIGHT DIABETIC;  Surgeon: Leandrew Koyanagi, MD;  Location: [facility];  Service: Ophthalmology;  Laterality: Right;  patient wants last   CHOLECYSTECTOMY     COLONOSCOPY  12/23/2010   IR THORACENTESIS ASP PLEURAL SPACE W/IMG GUIDE  07/18/2022   THORACENTESIS     02/25/21, 10/17/21     Social History:   reports that he has never smoked. He has never used smokeless tobacco. He reports that he does not drink alcohol and does not use drugs.   Family History:  His family history includes CAD in his mother; Diabetes in his father; Kidney failure in his father.   Allergies Allergies  Allergen Reactions   Pioglitazone Swelling   Furosemide Other (See Comments)    Pt may have had swelling in his face per spouse.  Drug discontinued     Metformin And Related Diarrhea   Prednisone     Other Reaction(s): Other (See Comments)  Face swells up and severe bleeding   Atorvastatin Rash   Benazepril Rash   Latex Rash    (tape only)   Other Rash    Pt reported that he is allergic to Ace wrap.  Reaction include severe skin irritation.   Tape Rash     Home Medications  Prior to Admission medications   Medication Sig Start Date End Date Taking? Authorizing Provider  acetaminophen (TYLENOL) 325 MG tablet Take  325 mg by mouth every 4 (four) hours as needed.    [provider]  aspirin EC 81 MG tablet Take 81 mg by mouth daily.    [provider]  Continuous Blood Gluc Receiver (FREESTYLE LIBRE 14 DAY READER) DEVI Use 1 Device as directed Dx: E11.29 02/22/20   [provider]  Continuous Blood Gluc Sensor (FREESTYLE LIBRE 14 DAY SENSOR) MISC SMARTSIG:1 Kit(s) Topical Every 2 Weeks 10/29/20   [provider]  glipiZIDE (GLUCOTROL) 5 MG tablet Take 5 mg by mouth daily before breakfast. 07/28/22   [provider]  insulin  degludec (TRESIBA) 100 UNIT/ML FlexTouch Pen Inject 10 Units into the skin daily.    [provider]  meclizine (ANTIVERT) 25 MG tablet Take 1 tablet (25 mg total) by mouth 3 (three) times daily as needed for dizziness. Patient not taking: Reported on 09/18/2022 03/02/20   Sharen Hones, MD  metoprolol succinate (TOPROL-XL) 25 MG 24 hr tablet Take 0.5 tablets (12.5 mg total) by mouth daily. 08/28/22   Alisa Graff, FNP  nitroGLYCERIN (NITROSTAT) 0.4 MG SL tablet Place 0.4 mg under the tongue every 5 (five) minutes as needed for chest pain. Patient not taking: Reported on 08/28/2022    [provider]  potassium chloride (KLOR-CON) 10 MEQ tablet Take 1 tablet (10 mEq total) by mouth 2 (two) times daily. 08/28/22   Alisa Graff, FNP  sacubitril-valsartan (ENTRESTO) 24-26 MG Take 1 tablet by mouth 2 (two) times daily. 08/28/22   Alisa Graff, FNP  senna-docusate (SENOKOT-S) 8.6-50 MG tablet Take 1 tablet by mouth 2 (two) times daily. 08/13/22   Raiford Noble Latif, DO  spironolactone (ALDACTONE) 25 MG tablet Take 1 tablet (25 mg total) by mouth daily. 08/28/22   Alisa Graff, FNP  tamsulosin (FLOMAX) 0.4 MG CAPS capsule Take 0.4 mg by mouth daily.    [provider]  torsemide (DEMADEX) 20 MG tablet Take by mouth daily. 20 mg in the AM and 20 mg in the PM as needed    [provider]     Critical care provider statement:   Total critical care time: 33 minutes   Performed by: Lanney Gins MD   Critical care time was exclusive of separately billable procedures and treating other patients.   Critical care was necessary to treat or prevent imminent or life-threatening deterioration.   Critical care was time spent personally by me on the following activities: development of treatment plan with patient and/or surrogate as well as nursing, discussions with consultants, evaluation of patient's response to treatment, examination of patient, obtaining history from  patient or surrogate, ordering and performing treatments and interventions, ordering and review of laboratory studies, ordering and review of radiographic studies, pulse oximetry and re-evaluation of patient's condition.    Ottie Glazier, M.D.  Pulmonary & Critical Care Medicine

## 2022-10-17 NOTE — Death Summary Note (Addendum)
   Death Summary   Rick Zuniga UMP:[id number] DOB: 30-Jun-1946 DOA: 10-04-2022  PCP: [name], MD  Admit date: October 04, 2022 Date of Death: 10/06/2022  Time of death: 70  Final Diagnoses:  Principal Problem:   Cardiac arrest ([place])    History of present illness:   This is a 77 yo male who presented to [facility] ER on October 04, 2022 post witnessed cardiac arrest.  According to pts significant other the pt c/o bilateral shoulder and upper back pain radiating to the left leg on 02/12.  On 10-04-22 the pt stated he felt better and wanted to go to [place] to eat.  When they arrived at the restaurant he had a witnessed cardiac arrest, bystander CPR initiated.  Estimated downtime prior to EMS arrival was 8 minutes.  EMS arrived on the scene pt in v-fib requiring defibrillation x1.  He subsequently went into PEA requiring 3 rounds of CPR prior to ROSC.     Hospital Course:   Patient remained hypotensive post cardiac arrest with requirement for vasopressor support, further needed multiple vasopressors, then developed multiorgan failure.  Nephrology offered RRT to try to salvage any chance of life but patient continued to decline and eventually lost his pulses.  He was made DNR  by family prior to this and did not have ACLS. Family was present when he passed away and was very appreciative of care.  May [name] rest in peace.    Signed:   Ottie Glazier, M.D.  Pulmonary & [place]

## 2022-10-17 NOTE — Consult Note (Signed)
ANTICOAGULATION CONSULT NOTE - Initial Consult  Pharmacy Consult for heparin infusion - NO BOLUS Indication: atrial fibrillation  Allergies  Allergen Reactions   Pioglitazone Swelling   Furosemide Other (See Comments)    Pt may have had swelling in his face per spouse.  Drug discontinued     Metformin And Related Diarrhea   Prednisone     Other Reaction(s): Other (See Comments)  Face swells up and severe bleeding   Atorvastatin Rash   Benazepril Rash   Latex Rash    (tape only)   Other Rash    Pt reported that he is allergic to Ace wrap.  Reaction include severe skin irritation.   Tape Rash    Patient Measurements: Weight: 75.8 kg (167 lb 1.7 oz) Heparin Dosing Weight: 75.7 kg  Vital Signs: Temp: 99.3 F (37.4 C) (02/14 2315) Temp Source: Bladder (02/14 2300) BP: 99/64 (02/14 2315) Pulse Rate: 121 (02/14 2315)  Labs: Recent Labs    09/18/2022 1327 10/15/2022 1612 10/13/2022 1938 10/01/22 0336 10/01/22 1111 10/01/22 1319 10/01/22 1500 10/01/22 2253  HGB 10.6* 10.9*  --  10.1*  --   --   --   --   HCT 31.8* 32.2*  --  28.7*  --   --   --   --   PLT 87* 85*  --  86*  --   --   --   --   LABPROT 16.4*  --   --  16.8*  --   --   --   --   INR 1.3*  --   --  1.4*  --   --   --   --   HEPARINUNFRC  --   --   --  0.18*  --   --  0.77* 1.05*  CREATININE 3.37* 3.27*  --  3.00* 2.73*  --   --   --   TROPONINIHS 76* 415*   < > 4,731* 4,236* 3,678*  --   --    < > = values in this interval not displayed.     Estimated Creatinine Clearance: 24.3 mL/min (A) (by C-G formula based on SCr of 2.73 mg/dL (H)).   Medical History: Past Medical History:  Diagnosis Date   AICD (automatic cardioverter/defibrillator) present    Arthritis    Cataract    Mixed OU   CHF (congestive heart failure) (HCC)    Chronic kidney disease    Complication of anesthesia    Difficulty inserting IV, wants expert at IV's, no multiple attempts   COPD (chronic obstructive pulmonary disease) ([place])     COVID-19 05/23/2022   Diabetes (HCC)    Type 2   Diabetic retinopathy (HCC)    NPDR OU   Heart disease    Hypertension    Hypertensive retinopathy    OU   Indigestion    Presence of permanent cardiac pacemaker    Recurrent pleural effusion on left    Thrombocytopenia (HCC)    Wears dentures    full upper and lower    Medications:  PTA: N/A Inpatient: heparin infusion (2/13 >>>) Allergies: No AC/APT related allergies  Assessment: 77 year old male with history of thrombocytopenia and AICD presents to ED after witness sudden death episode. Pharmacy conuslted for management of heparin infusion in the setting of suspected ACS.  Platelets 86, Hgb 10.1  Date Time aPTT/HL Rate/Comment 2/14 0336 0.18  Subtherapeutic; 1000 units/hr  2/14 1500 0.77  Supratherapeutic; 1250 units/hr 2/14  2253    1.05                SUPRAtherapeutic; 1250 units/hr  Goal of Therapy:  Heparin level 0.3-0.7 units/ml Monitor platelets by anticoagulation protocol: Yes NO BOLUS   Plan:  2/14:  HL @ [id number] = 1.05, SUPRAtherapeutic Will hold heparin gtt for 1 hr and restart @ 1000 units/hr.  Check anti-Xa level in 8 hours and restart and daily once consecutively therapeutic. Continue to monitor H&H and platelets daily while on heparin gtt.  Sita Mangen D Clinical Pharmacist  October 31, 2022 12:27 AM

## 2022-10-17 NOTE — Procedures (Signed)
Central Venous Catheter Placement:TRIPLE LUMEN Indication: Patient receiving vesicant or irritant drug.; Patient receiving intravenous therapy for longer than 5 days.; Patient has limited or no vascular access.   Consent:by son in chart    Hand washing performed prior to starting the procedure.   Procedure:   An active timeout was performed and correct patient, name, & ID confirmed.   Patient was positioned correctly for central venous access.  Ultrasound technology was used during this procedure  Patient was prepped using strict sterile technique including chlorohexadine preps, sterile drape, sterile gown and sterile gloves.    The area was prepped, draped and anesthetized in the usual sterile manner. Patient comfort was obtained.    A triple lumen catheter was placed in Left Femoral Vein There was good blood return, catheter caps were placed on lumens, catheter flushed easily, the line was secured and a sterile dressing and BIO-PATCH applied.   Ultrasound was used to visualize vasculature and guidance of needle.   Number of Attempts: 1 Complications:none Estimated Blood Loss: none     Ottie Glazier, M.D.  Pulmonary & [place]

## 2022-10-17 DEATH — deceased

## 2022-11-08 IMAGING — DX DG CHEST 1V PORT
1 series · 1 of 1 positions shown · non-contrast
Comparison: Radiograph 07/06/2021

CLINICAL DATA: Left pleural effusion. Status post left
thoracentesis.

EXAM:
PORTABLE CHEST 1 VIEW

[chest ap]
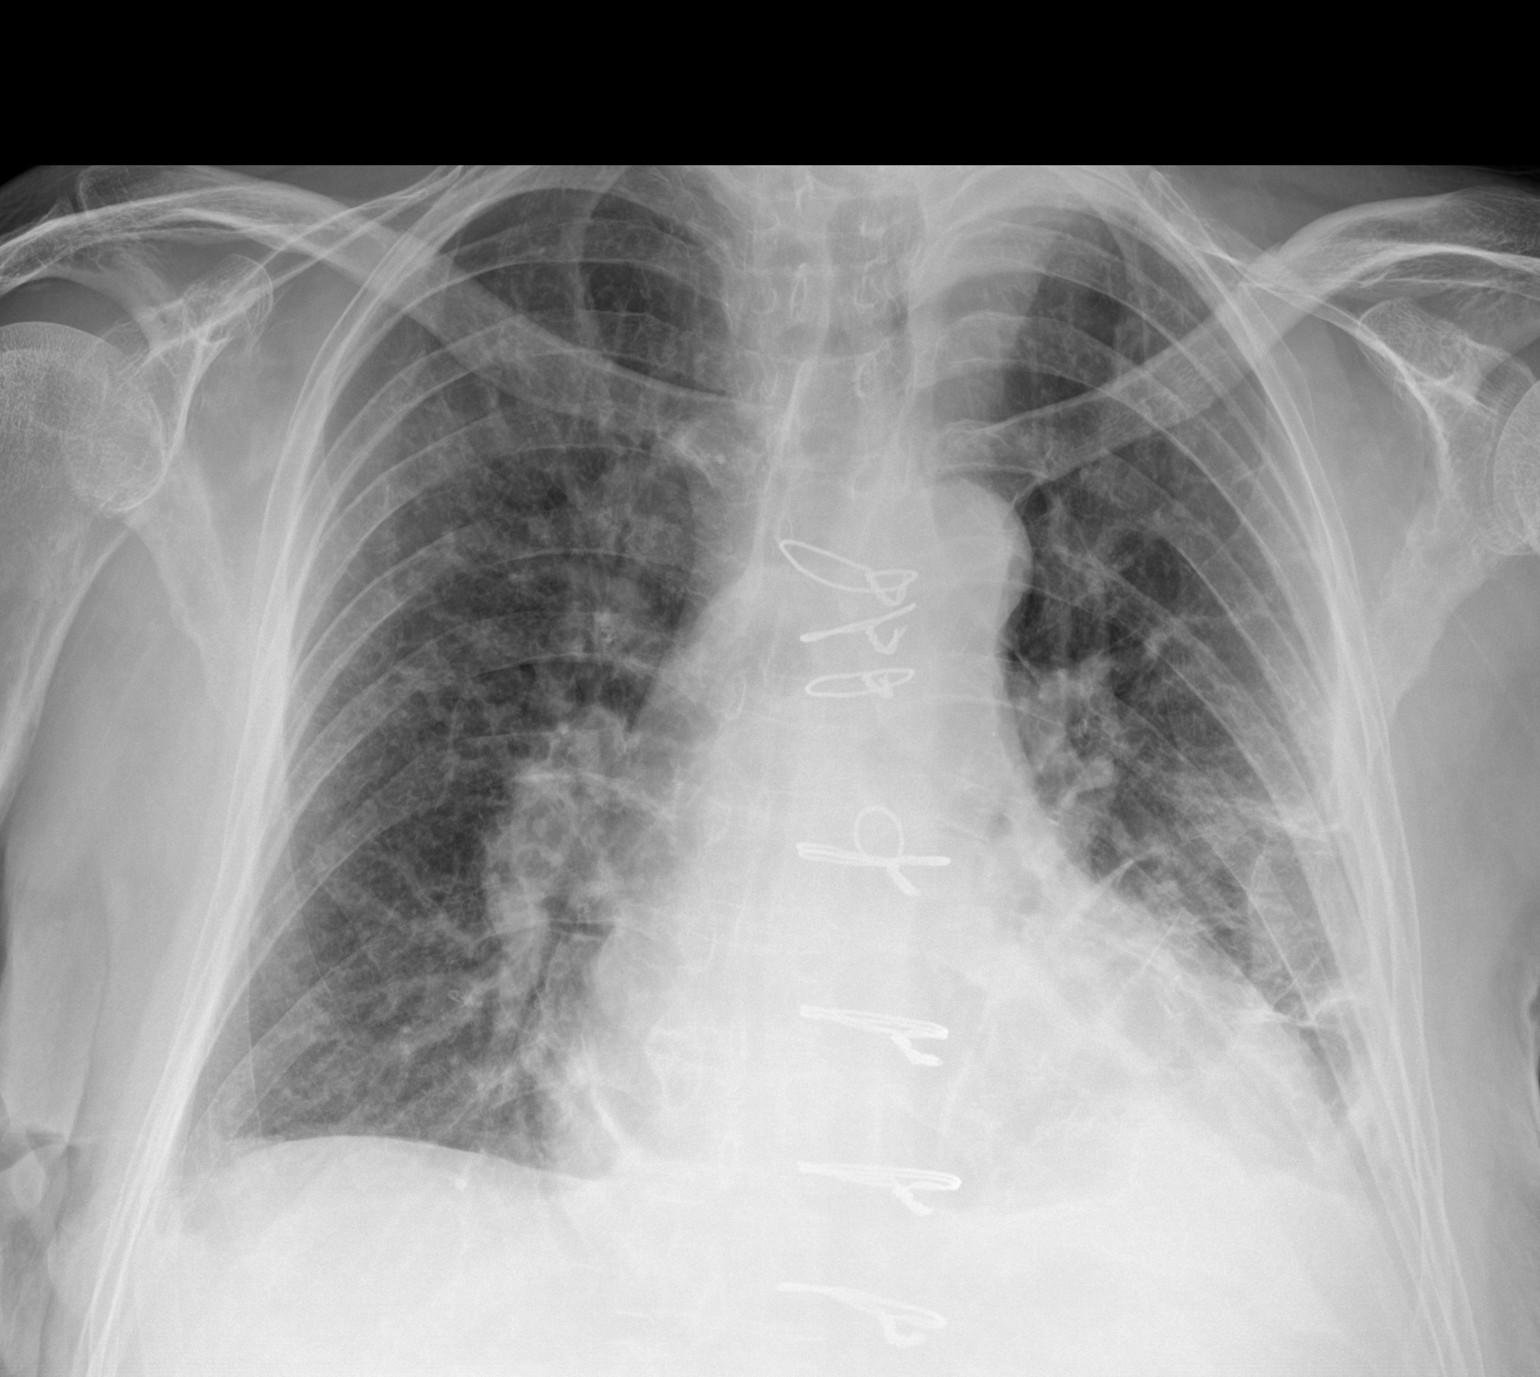

[1 of 1 positions shown; findings below may reference images not displayed]

FINDINGS: No evidence of pneumothorax post left thoracentesis. Small volume of
pleural fluid persists. Irregular linear opacities at the left lung
base are similar to prior and may represent atelectasis or scarring.
Post median sternotomy. The heart is enlarged but stable. Unchanged
mediastinal contours. Mild peribronchial thickening. Slight blunting
of right costophrenic angle may represent small right pleural
effusion.
IMPRESSION: 1. No evidence of pneumothorax post left thoracentesis. Small
residual pleural fluid on the left.
2. Chronic cardiomegaly.  Possible minimal right pleural effusion.

## 2022-11-08 IMAGING — US US THORACENTESIS ASP PLEURAL SPACE W/IMG GUIDE
1 series · 4 of 4 positions shown · non-contrast
Comparison: none

INDICATION: Patient with a history of heart failure, chronic kidney disease and
recurrent left pleural effusion presents today for a diagnostic and
therapeutic thoracentesis.

[Series 1: us thoracentesis asp pleural space w/img guide · 0.33mm/px · 4 of 4 slices shown]
[im 1/4]
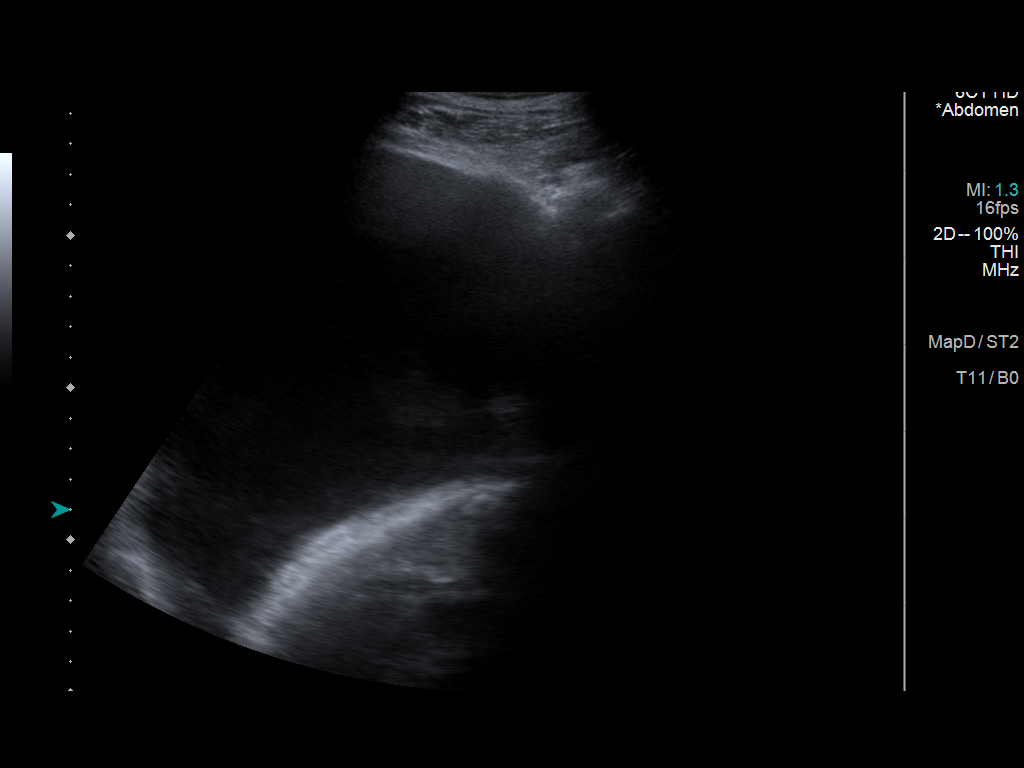
[im 2/4]
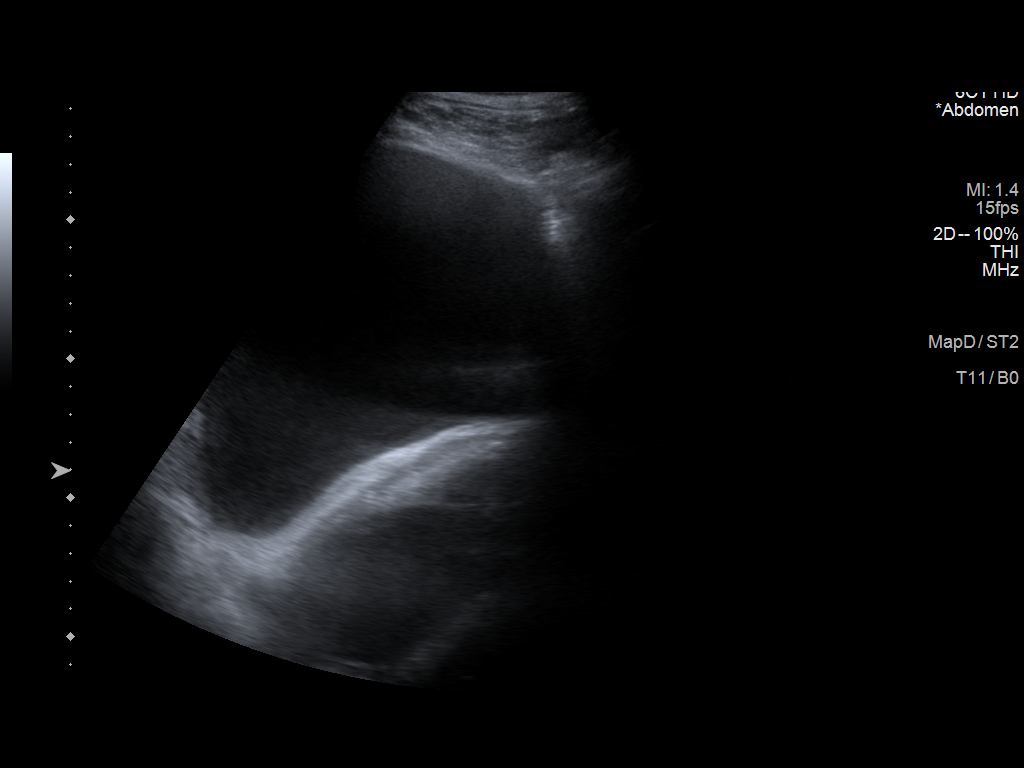
[im 3/4]
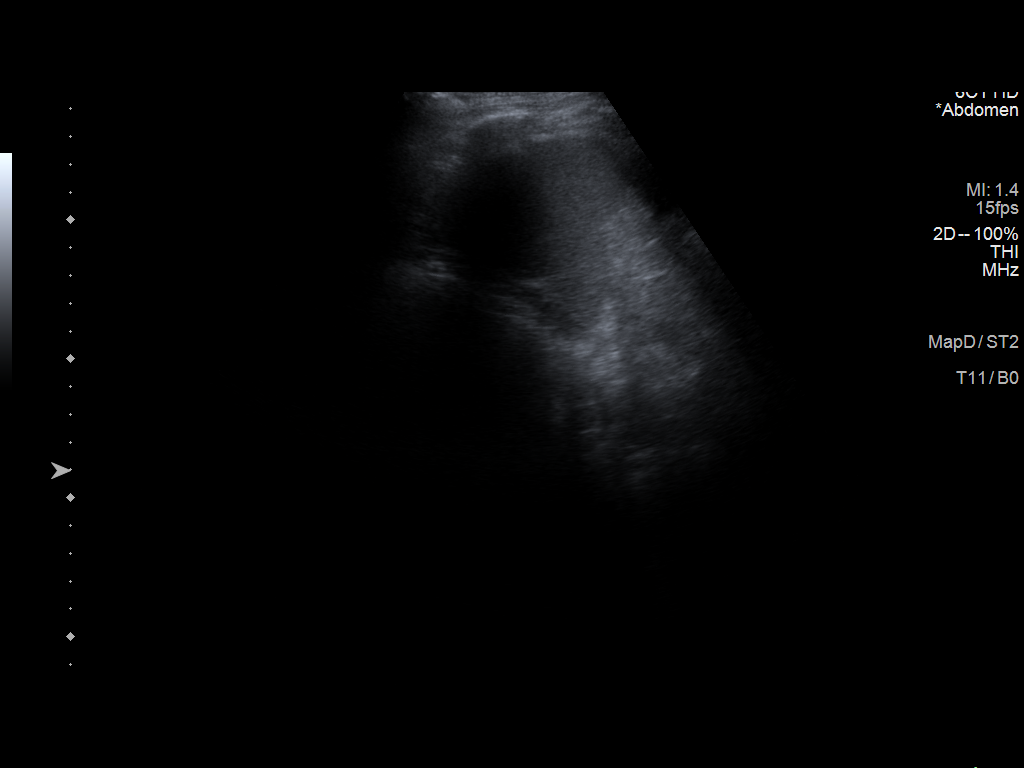
[im 4/4]
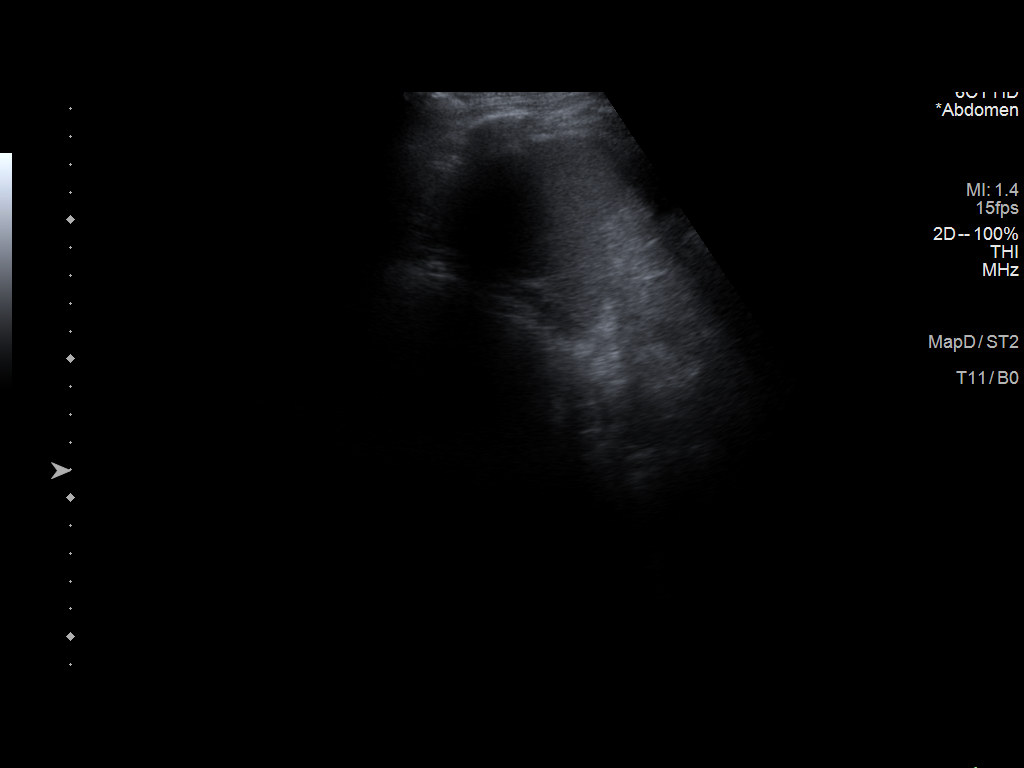

[4 of 4 positions shown; findings below may reference images not displayed]

EXAM:
ULTRASOUND GUIDED THORACENTESIS

MEDICATIONS:
1% lidocaine 10 mL

COMPLICATIONS:
None immediate.

PROCEDURE:
An ultrasound guided thoracentesis was thoroughly discussed with the
patient and questions answered. The benefits, risks, alternatives
and complications were also discussed. The patient understands and
wishes to proceed with the procedure. Written consent was obtained.

Ultrasound was performed to localize and mark an adequate pocket of
fluid in the left chest. The area was then prepped and draped in the
normal sterile fashion. 1% Lidocaine was used for local anesthesia.
Under ultrasound guidance a 6 Fr Safe-T-Centesis catheter was
introduced. Thoracentesis was performed. The catheter was removed
and a dressing applied.
FINDINGS: A total of approximately 1.2 L of clear yellow fluid was removed.
Samples were sent to the laboratory as requested by the clinical
team.
IMPRESSION: Successful ultrasound guided left thoracentesis yielding 1.2 L of
pleural fluid. Read by: Gato Preto Nejo, NP

## 2022-12-25 ENCOUNTER — Other Ambulatory Visit: Payer: Self-pay
